# Patient Record
Sex: Male | Born: 1955 | Race: White | Hispanic: No | Marital: Married | Smoking: Never smoker
Health system: Southern US, Community
[De-identification: ages and names within clinical notes are randomized; demographics above are authoritative.]

## PROBLEM LIST (undated history)

## (undated) DIAGNOSIS — J309 Allergic rhinitis, unspecified: Secondary | ICD-10-CM

## (undated) DIAGNOSIS — K219 Gastro-esophageal reflux disease without esophagitis: Secondary | ICD-10-CM

## (undated) DIAGNOSIS — J45990 Exercise induced bronchospasm: Secondary | ICD-10-CM

## (undated) DIAGNOSIS — G4730 Sleep apnea, unspecified: Secondary | ICD-10-CM

## (undated) DIAGNOSIS — N529 Male erectile dysfunction, unspecified: Secondary | ICD-10-CM

## (undated) DIAGNOSIS — N400 Enlarged prostate without lower urinary tract symptoms: Secondary | ICD-10-CM

## (undated) DIAGNOSIS — N429 Disorder of prostate, unspecified: Secondary | ICD-10-CM

## (undated) DIAGNOSIS — F32A Depression, unspecified: Secondary | ICD-10-CM

## (undated) DIAGNOSIS — M353 Polymyalgia rheumatica: Secondary | ICD-10-CM

## (undated) DIAGNOSIS — R1013 Epigastric pain: Secondary | ICD-10-CM

## (undated) DIAGNOSIS — E7800 Pure hypercholesterolemia, unspecified: Secondary | ICD-10-CM

## (undated) DIAGNOSIS — J45909 Unspecified asthma, uncomplicated: Secondary | ICD-10-CM

## (undated) DIAGNOSIS — R140 Abdominal distension (gaseous): Secondary | ICD-10-CM

## (undated) DIAGNOSIS — R7303 Prediabetes: Secondary | ICD-10-CM

## (undated) DIAGNOSIS — Z87442 Personal history of urinary calculi: Secondary | ICD-10-CM

## (undated) HISTORY — PX: ANKLE SURGERY: SHX546

## (undated) HISTORY — PX: COLONOSCOPY: SHX174

## (undated) HISTORY — PX: VASECTOMY: SHX75

## (undated) HISTORY — PX: WISDOM TOOTH EXTRACTION: SHX21

## (undated) HISTORY — PX: TONSILLECTOMY: SUR1361

## (undated) HISTORY — DX: Unspecified asthma, uncomplicated: J45.909

## (undated) HISTORY — DX: Allergic rhinitis, unspecified: J30.9

## (undated) HISTORY — DX: Male erectile dysfunction, unspecified: N52.9

## (undated) HISTORY — PX: CHEST TUBE INSERTION: SHX231

## (undated) HISTORY — DX: Epigastric pain: R10.13

## (undated) HISTORY — DX: Benign prostatic hyperplasia without lower urinary tract symptoms: N40.0

## (undated) HISTORY — DX: Gastro-esophageal reflux disease without esophagitis: K21.9

## (undated) HISTORY — DX: Pure hypercholesterolemia, unspecified: E78.00

## (undated) HISTORY — PX: SKIN CANCER EXCISION: SHX779

## (undated) HISTORY — DX: Prediabetes: R73.03

## (undated) HISTORY — PX: KNEE SURGERY: SHX244

## (undated) HISTORY — DX: Abdominal distension (gaseous): R14.0

## (undated) HISTORY — DX: Depression, unspecified: F32.A

---

## 1998-07-24 ENCOUNTER — Ambulatory Visit (HOSPITAL_BASED_OUTPATIENT_CLINIC_OR_DEPARTMENT_OTHER): Admission: RE | Admit: 1998-07-24 | Discharge: 1998-07-24 | Payer: Self-pay | Admitting: Urology

## 2002-01-14 ENCOUNTER — Ambulatory Visit (HOSPITAL_BASED_OUTPATIENT_CLINIC_OR_DEPARTMENT_OTHER): Admission: RE | Admit: 2002-01-14 | Discharge: 2002-01-14 | Payer: Self-pay | Admitting: Orthopedic Surgery

## 2002-02-03 ENCOUNTER — Ambulatory Visit (HOSPITAL_COMMUNITY): Admission: RE | Admit: 2002-02-03 | Discharge: 2002-02-03 | Payer: Self-pay | Admitting: *Deleted

## 2002-02-03 ENCOUNTER — Encounter: Payer: Self-pay | Admitting: *Deleted

## 2003-05-20 ENCOUNTER — Ambulatory Visit (HOSPITAL_BASED_OUTPATIENT_CLINIC_OR_DEPARTMENT_OTHER): Admission: RE | Admit: 2003-05-20 | Discharge: 2003-05-20 | Payer: Self-pay | Admitting: Family Medicine

## 2003-05-25 ENCOUNTER — Encounter (INDEPENDENT_AMBULATORY_CARE_PROVIDER_SITE_OTHER): Payer: Self-pay | Admitting: *Deleted

## 2003-05-25 ENCOUNTER — Ambulatory Visit (HOSPITAL_COMMUNITY): Admission: RE | Admit: 2003-05-25 | Discharge: 2003-05-25 | Payer: Self-pay | Admitting: Gastroenterology

## 2005-08-19 ENCOUNTER — Ambulatory Visit (HOSPITAL_COMMUNITY): Admission: RE | Admit: 2005-08-19 | Discharge: 2005-08-19 | Payer: Self-pay | Admitting: Family Medicine

## 2005-09-03 ENCOUNTER — Ambulatory Visit (HOSPITAL_COMMUNITY): Admission: RE | Admit: 2005-09-03 | Discharge: 2005-09-03 | Payer: Self-pay | Admitting: Family Medicine

## 2007-04-02 DIAGNOSIS — G43909 Migraine, unspecified, not intractable, without status migrainosus: Secondary | ICD-10-CM

## 2007-04-02 HISTORY — DX: Migraine, unspecified, not intractable, without status migrainosus: G43.909

## 2007-06-17 ENCOUNTER — Emergency Department (HOSPITAL_COMMUNITY): Admission: EM | Admit: 2007-06-17 | Discharge: 2007-06-18 | Payer: Self-pay | Admitting: Emergency Medicine

## 2010-04-22 ENCOUNTER — Encounter: Payer: Self-pay | Admitting: Family Medicine

## 2010-05-09 ENCOUNTER — Other Ambulatory Visit: Payer: Self-pay | Admitting: Orthopedic Surgery

## 2010-05-09 ENCOUNTER — Ambulatory Visit (HOSPITAL_BASED_OUTPATIENT_CLINIC_OR_DEPARTMENT_OTHER)
Admission: RE | Admit: 2010-05-09 | Discharge: 2010-05-09 | Disposition: A | Discharge status: Home / Self Care | Payer: BC Managed Care – PPO | Source: Ambulatory Visit | Attending: Orthopedic Surgery | Admitting: Orthopedic Surgery

## 2010-05-09 DIAGNOSIS — D2120 Benign neoplasm of connective and other soft tissue of unspecified lower limb, including hip: Secondary | ICD-10-CM | POA: Insufficient documentation

## 2010-05-09 LAB — POCT HEMOGLOBIN-HEMACUE: Hemoglobin: 16.1 g/dL (ref 13.0–17.0)

## 2010-06-17 NOTE — Op Note (Signed)
  NAME:  Elijah [name], Elijah [name] NO.:  [id number]  MEDICAL RECORD NO.:  [id number]           PATIENT TYPE:  LOCATION:                                 FACILITY:  PHYSICIAN:  Leonides Grills, M.D.          DATE OF BIRTH:  DATE OF PROCEDURE:  05/09/2010 DATE OF DISCHARGE:                              OPERATIVE REPORT   PREOPERATIVE DIAGNOSIS:  Benign deep soft tissue lesion, left foot.  POSTOPERATIVE DIAGNOSIS:  Benign deep soft tissue lesion, left foot.  OPERATION:  Excision of left benign deep soft tissue lesion, left foot.  ANESTHESIA:  General.  SURGEON:  Leonides Grills, MD  ASSISTANT:  Richardean Canal, PA  ESTIMATED BLOOD LOSS:  Minimal.  TOURNIQUET TIME:  Approximately 15 minutes.  COMPLICATIONS:  None.  DISPOSITION:  Stable to PR.  INDICATIONS:  This is a 55 year old male who has a painful lesion over the lateral aspect of his left foot that was interfering with his life. MRI showed it was a soft tissue fibrous lesion.  He was consented for the above procedure.  All risks of infection, nerve or vessel injury, recurrence of lesion, persistent pain, prolonged recovery, wound healing problems were all explained, questions were encouraged and answered. The patient was brought to the operating room, placed in supine position.  After adequate general endotracheal tube anesthesia was administered as well as Ancef 1 g IV piggyback, a bump was placed in the left ipsilateral hip, internally rotating left lower extremity.  All bony prominence were well padded.  Left lower extremity was then prepped and draped in sterile manner over proximally thigh tourniquet.  Limb was gravity exsanguinated.  Tourniquet was elevated to 290 mmHg.  An ellipsed-shaped incision was then made.  Dissection was carried down full thickness en bloc to muscle, this was then removed.  Tourniquet was deflated, hemostasis was obtained.  The area was copiously irrigated with normal saline.  Skin was  closed with 4-0 nylon stitch.  Sterile dressing was applied.  Hard-sole shoe was applied.  The patient stable to PR.     Leonides Grills, M.D.     PB/MEDQ  D:  05/09/2010  T:  05/10/2010  Job:  [id number]  Electronically Signed by Leonides Grills M.D. on 06/17/2010 08:31:01 AM

## 2010-08-17 NOTE — Op Note (Signed)
NAME:  Elijah [name], Elijah [name] NO.:  [id number]   MEDICAL RECORD NO.:  [id number]                   PATIENT TYPE:  AMB   LOCATION:  ENDO                                 FACILITY:  [facility]   PHYSICIAN:  Petra Kuba, M.D.                 DATE OF BIRTH:  Aug 27, 1955   DATE OF PROCEDURE:  05/25/2003  DATE OF DISCHARGE:                                 OPERATIVE REPORT   PROCEDURE PERFORMED:  Colonoscopy with polypectomy.   ENDOSCOPIST:  Petra Kuba, M.D.   INDICATIONS FOR PROCEDURE:  Bright red blood per rectum in a patient almost  due for colonic screening.  Consent was signed after the risks, benefits,  methods and options were thoroughly discussed with the patient and his wife.   MEDICINES USED:  No additional medicines were given.   DESCRIPTION OF PROCEDURE:  Rectal inspection was negative.  Digital exam was  negative.  A regular video colonoscope was inserted and anorectal  pullthrough and retroflexion confirmed some internal hemorrhoids.  No signs  of bleeding were seen.  The scope was reinserted easily around his colon.  This did require some abdominal pressure but no position changes.  The cecum  was identified by the appendiceal orifice and the ileocecal valve.  In fact  the scope was inserted a short ways in the terminal ileum which was normal.  Photodocumentation was obtained.  The prep on the right side was not as good  as the left and there was some stool adherent to the wall which could not be  washed off.  No obvious abnormality could be seen on insertion or any signs  of bleeding.  The scope was slowly withdrawn.  No abnormalities were seen as  we slowly withdrew back to the distal sigmoid.  The prep on the left side  was adequate with easily able to wash and suction the stool.  A distal  sigmoid tiny polyp was seen and was hot biopsied times one, also a proximal  rectum polyp was seen and this was hot biopsied times one and then put in  the  same container.  Again anorectal pullthrough and retroflexion confirmed  these small internal hemorrhoids.  Scope was reinserted a short ways up the  left side of the colon, air was suctioned, scope removed.  The patient  tolerated the procedure well.  There was no immediate obvious complication.   ENDOSCOPIC DIAGNOSIS:  1. Internal hemorrhoids.  2. Two tiny polyps in the rectum and distal sigmoid hot biopsied.  3. Otherwise within normal limits to the terminal ileum.   PLAN:  Treat internal hemorrhoids with Anusol suppositories 25 gm HC.  Follow-up as needed or in two months to recheck symptoms and make sure no  further work-up plans are needed. Await pathology but probably recheck colon  screening in five years.  Petra Kuba, M.D.    MEM/MEDQ  D:  05/25/2003  T:  05/26/2003  Job:  [id number]   cc:   Donia Guiles, M.D.  [address] [place]  [place] [address]  Fax: [phone]

## 2010-08-17 NOTE — Op Note (Signed)
NAME:  Elijah [name], Elijah [name] NO.:  [id number]   MEDICAL RECORD NO.:  [id number]                   PATIENT TYPE:  AMB   LOCATION:  ENDO                                 FACILITY:  [facility]   PHYSICIAN:  Petra Kuba, M.D.                 DATE OF BIRTH:  1955-12-29   DATE OF PROCEDURE:  05/25/2003  DATE OF DISCHARGE:                                 OPERATIVE REPORT   PROCEDURE PERFORMED:  Esophagogastroduodenoscopy with Savary dilatation.   ENDOSCOPIST:  Petra Kuba, M.D.   INDICATIONS FOR PROCEDURE:  Dysphagia.  Consent was signed after the risks,  benefits, methods and options were thoroughly discussed in the office with  both the patient and his wife.   MEDICINES USED:  Demerol 120 mg, Versed 12 mg.   DESCRIPTION OF PROCEDURE:  The video endoscope was inserted by direct  vision.  The esophagus looked normal.  No ring or stricture was seen.  He  did have a tiny hiatal hernia in the distal esophagus.  We did rewithdraw  multiple times to about 18 cm and advanced into the stomach without any  obvious abnormalities.  Scope was then inserted into the stomach, advanced  through a normal antrum, normal pylorus into the duodenal bulb.  Possibly  there was some signs of a previously healed ulcer, but no active ulceration.  The scope was advanced around the C-loop to a normal second portion of the  duodenum.  The scope was withdrawn back to the bulb and a good look there  confirmed above findings.  Scope was withdrawn back to the stomach and  retroflexed.  High in the cardia, the hiatal hernia was confirmed.  The  fundus, angularis, lesser and greater curve were all normal.  Straight  visualization of the stomach did not reveal any additional findings.  Again,  the scope was slowly withdrawn back to about 18 cm.  No additional  esophageal findings were seen.  The scope was then advanced to the antrum  and under fluoroscopic guidance, a Savary wire was advanced.   The customary  J-loop was confirmed and under fluoro guidance, the scope was withdrawn,  making sure to keep the wire in the proper position.  We went ahead and  proceeded with a 16 mm dilator under fluoro guidance over the wire which  passed without resistance and was confirmed in the stomach.  Once in the  stomach, the dilator and wire were removed in tandem.  There was no heme on  the dilator.  The patient tolerated the procedure well.  There were no  obvious immediate complication.   ENDOSCOPIC DIAGNOSIS:  1. Tiny hiatal hernia.  2. Questionable bulb with scarring from healed ulcer.  3. Otherwise normal esophagogastroduodenoscopy with therapy.  Savary     dilatation under fluoro to 16 mm without resistance or heme.   PLAN:  Nexium one time a day.  Barium  swallow, barium tablet as needed.  Will continue work-up with flexible sigmoidoscopy and colonoscopy and see  back to see how his symptoms are to see if more aggressive dilations or  further work-up plan is needed.                                               Petra Kuba, M.D.    MEM/MEDQ  D:  05/25/2003  T:  05/26/2003  Job:  [id number]   cc:   Donia Guiles, M.D.  [address] [place]  [place] [address]  Fax: [phone]

## 2010-08-17 NOTE — Op Note (Signed)
NAME:  Elijah [name], Elijah [name] NO.:  [id number]   MEDICAL RECORD NO.:  [id number]                   PATIENT TYPE:  AMB   LOCATION:  DSC                                  FACILITY:  [facility]   PHYSICIAN:  Loreta Ave, M.D.              DATE OF BIRTH:  1955-12-31   DATE OF PROCEDURE:  01/14/2002  DATE OF DISCHARGE:                                 OPERATIVE REPORT   PREOPERATIVE DIAGNOSES:  Symptomatic medial plica and traumatic  chondromalacia, right knee.   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  Normal tracking.  Traumatic superficial grade 3 chondromalacia of medial  femoral condyle with reactive synovitis.   PROCEDURES:  1. Right knee examination under anesthesia.  2. Arthroscopy with chondroplasty of patellofemoral joint and medial femoral     condyle.  3. Excision, medial plica.   SURGEON:  Loreta Ave, M.D.   ASSISTANT:  Arlys John D. Petrarca, P.A.-C.   ANESTHESIA:  General.   ESTIMATED BLOOD LOSS:  Minimal.   SPECIMENS:  None.   CULTURES:  None.   COMPLICATIONS:  None.   DRESSING:  Soft compressive.   DESCRIPTION OF PROCEDURE:  Patient brought to the operating room and placed  on the operating table in supine position.  After adequate anesthesia had  been obtained, right knee examined.  Good motion, good stability.  Patellofemoral crepitus but normal tracking.  Tourniquet and leg holder  applied, leg prepped and draped in the usual sterile fashion.  Three portals  created, one superolateral, one each medial and lateral peripatellar.  Inflow catheter introduced, knee distended, arthroscope introduced, knee  inspected.  Reactive synovitis throughout debrided.  Large fibrotic medial  plica excised.  Focal fibrillated grade 3 chondromalacia on medial border of  the  patella, treated with chondroplasty.  Tracking assessed and  patellofemoral tracking excellent.  Deep grade 3 chondral lesion in the  trochlea with fibrocartilage already at the base.  Loose flaps and  fragmentation.  All of this debrided.  At completion that entire area had  been debrided and there was reasonable articular cartilage on the margin  where the patella rode.  Some superficial grade 3 chondral changes, medial  femoral condyle weightbearing dome, treated with chondroplasty.  Remaining  knee examined and no other findings.  Medial and lateral meniscus intact  except for a little fraying in the central part of the lateral meniscus,  which was debrided.  Cruciate ligaments intact.  Hemostasis  with cautery.  Instruments and fluid removed.  Portals and the knee injected  with Marcaine.  Portals closed with 4-0 nylon.  A sterile compressive  dressing applied.  Anesthesia reversed.  Brought to the recovery room.  Tolerated the surgery well with no complications.  Loreta Ave, M.D.    DFM/MEDQ  D:  01/14/2002  T:  01/15/2002  Job:  [id number]

## 2010-12-24 LAB — COMPREHENSIVE METABOLIC PANEL
ALT: 20
AST: 19
Albumin: 3.6
Alkaline Phosphatase: 51
BUN: 18
CO2: 24
Calcium: 8.9
Chloride: 107
Creatinine, Ser: 1.27
GFR calc Af Amer: >60
GFR calc non Af Amer: >60
Glucose, Bld: 92
Potassium: 3.6
Sodium: 138
Total Bilirubin: 0.6
Total Protein: 6.4

## 2010-12-24 LAB — CK TOTAL AND CKMB (NOT AT ARMC)
CK, MB: 0.9
Relative Index: INVALID
Total CK: 91

## 2010-12-24 LAB — POCT I-STAT CREATININE
Creatinine, Ser: 1.5
Operator id: 257131

## 2010-12-24 LAB — I-STAT 8, (EC8 V) (CONVERTED LAB)
BUN: 18
Bicarbonate: 24.6 — ABNORMAL HIGH
Chloride: 108
Glucose, Bld: 94
HCT: 48
Hemoglobin: 16.3
Operator id: 257131
Potassium: 3.6
Sodium: 141
TCO2: 26
pCO2, Ven: 40.2 — ABNORMAL LOW
pH, Ven: 7.396 — ABNORMAL HIGH

## 2010-12-24 LAB — CBC
HCT: 43.3
Hemoglobin: 14.9
MCHC: 34.5
MCV: 85.9
Platelets: 235
RBC: 5.04
RDW: 13.6
WBC: 6.4

## 2010-12-24 LAB — DIFFERENTIAL
Basophils Absolute: 0
Basophils Relative: 0
Eosinophils Absolute: 0.2
Eosinophils Relative: 4
Lymphocytes Relative: 18
Lymphs Abs: 1.2
Monocytes Absolute: 1.2 — ABNORMAL HIGH
Monocytes Relative: 19 — ABNORMAL HIGH
Neutro Abs: 3.8
Neutrophils Relative %: 59

## 2010-12-24 LAB — PROTIME-INR
INR: 1
Prothrombin Time: 13.3

## 2010-12-24 LAB — APTT: aPTT: 28

## 2010-12-24 LAB — TROPONIN I: Troponin I: <0.01

## 2014-02-21 ENCOUNTER — Other Ambulatory Visit: Payer: Self-pay | Admitting: Dermatology

## 2019-11-05 ENCOUNTER — Other Ambulatory Visit: Payer: Self-pay | Admitting: Surgery

## 2019-11-05 DIAGNOSIS — R1011 Right upper quadrant pain: Secondary | ICD-10-CM

## 2019-11-26 ENCOUNTER — Other Ambulatory Visit: Payer: Self-pay | Admitting: Gastroenterology

## 2019-11-26 DIAGNOSIS — R1013 Epigastric pain: Secondary | ICD-10-CM

## 2019-11-26 DIAGNOSIS — R140 Abdominal distension (gaseous): Secondary | ICD-10-CM

## 2019-12-07 ENCOUNTER — Ambulatory Visit
Admission: RE | Admit: 2019-12-07 | Discharge: 2019-12-07 | Disposition: A | Discharge status: Home / Self Care | Payer: BC Managed Care – PPO | Source: Ambulatory Visit | Attending: Gastroenterology | Admitting: Gastroenterology

## 2019-12-07 DIAGNOSIS — R140 Abdominal distension (gaseous): Secondary | ICD-10-CM

## 2019-12-07 DIAGNOSIS — R1013 Epigastric pain: Secondary | ICD-10-CM

## 2020-06-21 ENCOUNTER — Encounter (HOSPITAL_BASED_OUTPATIENT_CLINIC_OR_DEPARTMENT_OTHER): Payer: Self-pay

## 2020-06-21 ENCOUNTER — Emergency Department (HOSPITAL_BASED_OUTPATIENT_CLINIC_OR_DEPARTMENT_OTHER)
Admission: EM | Admit: 2020-06-21 | Discharge: 2020-06-21 | Disposition: A | Discharge status: Home / Self Care | Payer: BC Managed Care – PPO | Attending: Emergency Medicine | Admitting: Emergency Medicine

## 2020-06-21 ENCOUNTER — Other Ambulatory Visit: Payer: Self-pay

## 2020-06-21 DIAGNOSIS — N23 Unspecified renal colic: Secondary | ICD-10-CM | POA: Diagnosis not present

## 2020-06-21 DIAGNOSIS — R109 Unspecified abdominal pain: Secondary | ICD-10-CM | POA: Diagnosis present

## 2020-06-21 HISTORY — DX: Disorder of prostate, unspecified: N42.9

## 2020-06-21 HISTORY — DX: Gastro-esophageal reflux disease without esophagitis: K21.9

## 2020-06-21 HISTORY — DX: Sleep apnea, unspecified: G47.30

## 2020-06-21 LAB — URINALYSIS, ROUTINE W REFLEX MICROSCOPIC
Bilirubin Urine: NEGATIVE
Glucose, UA: NEGATIVE mg/dL
Ketones, ur: 15 mg/dL — AB
Leukocytes,Ua: NEGATIVE
Nitrite: NEGATIVE
Protein, ur: NEGATIVE mg/dL
RBC / HPF: >50 RBC/hpf — ABNORMAL HIGH (ref 0–5)
Specific Gravity, Urine: 1.019 (ref 1.005–1.030)
pH: 5 (ref 5.0–8.0)

## 2020-06-21 MED ORDER — HYDROCODONE-ACETAMINOPHEN 5-325 MG PO TABS
1.0000 | ORAL_TABLET | Freq: Once | ORAL | Status: AC
  Administered 2020-06-21: 1 via ORAL
  Filled 2020-06-21: qty 1

## 2020-06-21 MED ORDER — PROCHLORPERAZINE EDISYLATE 10 MG/2ML IJ SOLN
5.0000 mg | Freq: Once | INTRAMUSCULAR | Status: AC
  Administered 2020-06-21: 5 mg via INTRAVENOUS
  Filled 2020-06-21: qty 2

## 2020-06-21 MED ORDER — KETOROLAC TROMETHAMINE 15 MG/ML IJ SOLN
15.0000 mg | Freq: Once | INTRAMUSCULAR | Status: AC
  Administered 2020-06-21: 15 mg via INTRAVENOUS
  Filled 2020-06-21: qty 1

## 2020-06-21 MED ORDER — HYDROMORPHONE HCL 1 MG/ML IJ SOLN
0.5000 mg | Freq: Once | INTRAMUSCULAR | Status: AC
  Administered 2020-06-21: 0.5 mg via INTRAVENOUS
  Filled 2020-06-21: qty 1

## 2020-06-21 MED ORDER — ONDANSETRON HCL 4 MG/2ML IJ SOLN
4.0000 mg | Freq: Once | INTRAMUSCULAR | Status: AC
Start: 2020-06-21 — End: 2020-06-21
  Administered 2020-06-21: 4 mg via INTRAVENOUS
  Filled 2020-06-21: qty 2

## 2020-06-21 MED ORDER — HYDROCODONE-ACETAMINOPHEN 5-325 MG PO TABS: 0.5000 | ORAL_TABLET | Freq: Three times a day (TID) | ORAL | 0 refills | Status: AC | PRN

## 2020-06-21 MED ORDER — KETOROLAC TROMETHAMINE 15 MG/ML IJ SOLN
7.5000 mg | Freq: Once | INTRAMUSCULAR | Status: AC
  Administered 2020-06-21: 7.5 mg via INTRAVENOUS

## 2020-06-21 MED ORDER — ONDANSETRON HCL 4 MG/2ML IJ SOLN
4.0000 mg | Freq: Once | INTRAMUSCULAR | Status: AC
  Administered 2020-06-21: 4 mg via INTRAVENOUS
  Filled 2020-06-21: qty 2

## 2020-06-21 MED ORDER — SODIUM CHLORIDE 0.9 % IV BOLUS
1000.0000 mL | Freq: Once | INTRAVENOUS | Status: AC
  Administered 2020-06-21: 1000 mL via INTRAVENOUS

## 2020-06-21 MED ORDER — ONDANSETRON 4 MG PO TBDP: 4.0000 mg | ORAL_TABLET | Freq: Three times a day (TID) | ORAL | 0 refills | Status: AC | PRN

## 2020-06-21 NOTE — ED Provider Notes (Addendum)
[place] EMERGENCY DEPT Provider Note  CSN: [id number] Arrival date & time: 06/21/20 0225  Chief Complaint(s) Flank Pain  HPI Elijah [name] is a 65 y.o. male with a past medical history listed below referring BPH here for left flank pain that began suddenly 1-1/2 hours prior to arrival.  Pain is deep ache that is fluctuating in nature and radiating to lower left abdomen.  Pain is moderate to severe.  No alleviating or aggravating factors.  Its associated with nausea and nonbloody nonbilious emesis.  No diarrhea.  No other physical complaints.  Patient is unaware of any history of renal stones but on review of systems there was a CT scan performed last year that showed 2 nonobstructing kidney stones on the left measuring 3-1/2 mm or less.  HPI  Past Medical History Past Medical History:  Diagnosis Date  . Acid reflux   . Migraine 2009  . Prostate disease   . Sleep apnea    There are no problems to display for this patient.  Home Medication(s) Prior to Admission medications   Medication Sig Start Date End Date Taking? Authorizing Provider  albuterol (PROVENTIL) (2.5 MG/3ML) 0.083% nebulizer solution Take 2.5 mg by nebulization every 6 (six) hours as needed for wheezing or shortness of breath.   Yes [provider]  alfuzosin (UROXATRAL) 10 MG 24 hr tablet Take 10 mg by mouth daily with breakfast.   Yes [provider]  finasteride (PROSCAR) 5 MG tablet Take 5 mg by mouth daily.   Yes [provider]  HYDROcodone-acetaminophen (NORCO/VICODIN) 5-325 MG tablet Take 0.5-1 tablets by mouth every 8 (eight) hours as needed for up to 5 days for severe pain (That is not improved by your scheduled acetaminophen regimen). Please do not exceed 4000 mg of acetaminophen (Tylenol) a 24-hour period. Please note that he may be prescribed additional medicine that contains acetaminophen. 06/21/20 06/26/20 Yes Pao Haffey, Grayce Sessions, MD  ondansetron (ZOFRAN ODT)  4 MG disintegrating tablet Take 1 tablet (4 mg total) by mouth every 8 (eight) hours as needed for up to 3 days for nausea or vomiting. 06/21/20 06/24/20 Yes Rayan Ines, Grayce Sessions, MD                                                                                                                                    Past Surgical History ** The histories are not reviewed yet. Please review them in the "History" navigator section and refresh this [place]. Family History No family history on file.  Social History   Allergies Singulair [montelukast] and Crestor [rosuvastatin]  Review of Systems Review of Systems All other systems are reviewed and are negative for acute change except as noted in the HPI  Physical Exam Vital Signs  I have reviewed the triage vital signs BP (!) 151/80 (BP Location: Right Arm)   Pulse 63   Temp 98 F (36.7 C) (Oral)   Resp 16  Ht 6\' 1"  (1.854 m)   Wt 105.2 kg   SpO2 99%   BMI 30.61 kg/m   Physical Exam Vitals reviewed.  Constitutional:      General: He is not in acute distress.    Appearance: He is well-developed. He is not diaphoretic.  HENT:     Head: Normocephalic and atraumatic.     Jaw: No trismus.     Right Ear: External ear normal.     Left Ear: External ear normal.     Nose: Nose normal.  Eyes:     General: No scleral icterus.    Conjunctiva/sclera: Conjunctivae normal.  Neck:     Trachea: Phonation normal.  Cardiovascular:     Rate and Rhythm: Normal rate and regular rhythm.  Pulmonary:     Effort: Pulmonary effort is normal. No respiratory distress.     Breath sounds: No stridor.  Abdominal:     General: There is no distension.     Tenderness: There is no abdominal tenderness.  Musculoskeletal:        General: Normal range of motion.     Cervical back: Normal range of motion.  Skin:    Comments: Palpable purpuric rash on right upper extremity.  Neurological:     Mental Status: He is alert and oriented to person, place,  and time.  Psychiatric:        Behavior: Behavior normal.     ED Results and Treatments Labs (all labs ordered are listed, but only abnormal results are displayed) Labs Reviewed  URINALYSIS, ROUTINE W REFLEX MICROSCOPIC - Abnormal; Notable for the following components:      Result Value   Hgb urine dipstick LARGE (*)    Ketones, ur 15 (*)    RBC / HPF >50 (*)    All other components within normal limits                                                                                                                         EKG  EKG Interpretation  Date/Time:    Ventricular Rate:    PR Interval:    QRS Duration:   QT Interval:    QTC Calculation:   R Axis:     Text Interpretation:        Radiology No results found.  Pertinent labs & imaging results that were available during my care of the patient were reviewed by me and considered in my medical decision making (see chart for details).  Medications Ordered in ED Medications  ondansetron (ZOFRAN) injection 4 mg (4 mg Intravenous Given 06/21/20 0244)  ketorolac (TORADOL) 15 MG/ML injection 15 mg (15 mg Intravenous Given 06/21/20 0244)  sodium chloride 0.9 % bolus 1,000 mL (0 mLs Intravenous Stopped 06/21/20 0500)  ketorolac (TORADOL) 15 MG/ML injection 7.5 mg (7.5 mg Intravenous Given 06/21/20 0341)  HYDROmorphone (DILAUDID) injection 0.5 mg (0.5 mg Intravenous Given 06/21/20 0340)  ondansetron (ZOFRAN) injection 4 mg (4 mg Intravenous Given 06/21/20 0340)  HYDROmorphone (DILAUDID) injection 0.5 mg (0.5 mg Intravenous Given 06/21/20 0441)  prochlorperazine (COMPAZINE) injection 5 mg (5 mg Intravenous Given 06/21/20 0441)  HYDROcodone-acetaminophen (NORCO/VICODIN) 5-325 MG per tablet 1 tablet (1 tablet Oral Given 06/21/20 1610)                                                                                                                                    Procedures Ultrasound ED Abd  Date/Time: 06/21/2020 4:23 AM Performed by:  Fatima Blank, MD Authorized by: Fatima Blank, MD   Procedure details:    Indications: flank pain     Assessment for:  Kidney stones and hydronephrosis   Left renal:  Visualized   Right renal:  Visualized       Left renal findings:    Nephrolithiasis: not identified     Renal stones: not identified     Perinephric fluid: not identified     Hydronephrosis: none   Right renal findings:    Nephrolithiasis: not identified     Renal stones: not identified     Perinephric fluid: not identified     Hydronephrosis: none   Comments:     Right renal cysts    (including critical care time)  Medical Decision Making / ED Course I have reviewed the nursing notes for this encounter and the patient's prior records (if available in EHR or on provided paperwork).   Elijah [name] was evaluated in Emergency Department on 06/21/2020 for the symptoms described in the history of present illness. He was evaluated in the context of the global COVID-19 pandemic, which necessitated consideration that the patient might be at risk for infection with the SARS-CoV-2 virus that causes COVID-19. Institutional protocols and algorithms that pertain to the evaluation of patients at risk for COVID-19 are in a state of rapid change based on information released by regulatory bodies including the CDC and federal and state organizations. These policies and algorithms were followed during the patient's care in the ED.  Left flank pain. Presentation is most suspicious for renal colic. Known left-sided renal stones seen on recent CT scan.  On CT scan there is no evidence of a AAA or diverticulosis. Abdomen is benign on exam. We will treat for renal colic with Toradol, Zofran and IV fluids. We will obtain a UA to assess for infection.  POCUS w/o evidence of hydronephrosis. No labs needed at this time.  Pain improved initially, but returned. Controlled after second round of meds. Patient already  has Alfuzosin at home. Recommended to take daily. Urology f/u as needed.      Final Clinical Impression(s) / ED Diagnoses Final diagnoses:  Renal colic on left side    The patient appears reasonably screened and/or stabilized for discharge and I doubt any other medical condition or other [facility] requiring further screening, evaluation, or treatment in the ED at this time prior to discharge. Safe for discharge with  strict return precautions.  Disposition: Discharge  Condition: Good  I have discussed the results, Dx and Tx plan with the patient/family who expressed understanding and agree(s) with the plan. Discharge instructions discussed at length. The patient/family was given strict return precautions who verbalized understanding of the instructions. No further questions at time of discharge.    ED Discharge Orders         Ordered    ondansetron (ZOFRAN ODT) 4 MG disintegrating tablet  Every 8 hours PRN        06/21/20 0558    HYDROcodone-acetaminophen (NORCO/VICODIN) 5-325 MG tablet  Every 8 hours PRN        06/21/20 [place] narcotic database reviewed and no active prescriptions noted.   Follow Up: Urology  Call  as needed     This chart was dictated using voice recognition software.  Despite best efforts to proofread,  errors can occur which can change the documentation meaning.     Fatima Blank, MD 06/21/20 [phone]

## 2020-06-21 NOTE — Discharge Instructions (Addendum)
For pain control you may take 600 mg of Ibuprofen every 6 hours or 440 mg of Naproxen every 12 hours.  In addition you can take 0.5 to 1 tablet of Norco/Vicodin every 8 hours for pain not controlled with the Ibuprofen or Naproxen.  Please take your Alfuzosin daily as this will help you pass the stone.  Follow up with Urology if needed.

## 2020-06-21 NOTE — ED Triage Notes (Addendum)
Pt via POV. Pt reports left sided flank pain that radiates to ABD started approx 0100 am today. +vomiting/Nausea.

## 2020-06-27 ENCOUNTER — Other Ambulatory Visit: Payer: Self-pay | Admitting: Urology

## 2020-06-27 ENCOUNTER — Other Ambulatory Visit: Payer: Self-pay

## 2020-06-27 ENCOUNTER — Encounter (HOSPITAL_COMMUNITY): Payer: Self-pay | Admitting: Urology

## 2020-06-27 NOTE — Progress Notes (Signed)
COVID Vaccine Completed: No Date COVID Vaccine completed: N/A Has received booster: N/A COVID vaccine manufacturer: N/A  Date of COVID positive in last 90 days: No  PCP - [name], MD Cardiologist - N/A   Chest x-ray - N/A EKG -N/A  Stress Test - greater than 2 years ECHO - N/A Cardiac Cath - N/A Pacemaker/ICD device last checked: N/A  Sleep Study - Yes CPAP - Yes  Fasting Blood Sugar - N/A Checks Blood Sugar _N/A____ times a day  Blood Thinner Instructions: N/A Aspirin Instructions: N/A Last Dose: N/A  Activity level:  Able to exercise without symptoms     Anesthesia review: N/A  Patient denies shortness of breath, fever, cough and chest pain at PAT appointment   Patient verbalized understanding of instructions that were given to them at the PAT appointment. Patient was also instructed that they will need to review over the PAT instructions again at home before surgery.

## 2020-06-28 ENCOUNTER — Other Ambulatory Visit (HOSPITAL_COMMUNITY)
Admission: RE | Admit: 2020-06-28 | Discharge: 2020-06-28 | Disposition: A | Discharge status: Home / Self Care | Payer: BC Managed Care – PPO | Source: Ambulatory Visit | Attending: Urology | Admitting: Urology

## 2020-06-28 DIAGNOSIS — Z888 Allergy status to other drugs, medicaments and biological substances status: Secondary | ICD-10-CM | POA: Diagnosis not present

## 2020-06-28 DIAGNOSIS — Z20822 Contact with and (suspected) exposure to covid-19: Secondary | ICD-10-CM | POA: Insufficient documentation

## 2020-06-28 DIAGNOSIS — Z01812 Encounter for preprocedural laboratory examination: Secondary | ICD-10-CM | POA: Diagnosis not present

## 2020-06-28 DIAGNOSIS — N201 Calculus of ureter: Secondary | ICD-10-CM | POA: Diagnosis present

## 2020-06-28 DIAGNOSIS — Z85828 Personal history of other malignant neoplasm of skin: Secondary | ICD-10-CM | POA: Diagnosis not present

## 2020-06-28 LAB — SARS CORONAVIRUS 2 (TAT 6-24 HRS): SARS Coronavirus 2: NEGATIVE

## 2020-06-29 ENCOUNTER — Ambulatory Visit (HOSPITAL_COMMUNITY): Payer: BC Managed Care – PPO | Admitting: Certified Registered Nurse Anesthetist

## 2020-06-29 ENCOUNTER — Ambulatory Visit (HOSPITAL_COMMUNITY)
Admission: RE | Admit: 2020-06-29 | Discharge: 2020-06-29 | Disposition: A | Discharge status: Home / Self Care | Payer: BC Managed Care – PPO | Attending: Urology | Admitting: Urology

## 2020-06-29 ENCOUNTER — Ambulatory Visit (HOSPITAL_COMMUNITY): Payer: BC Managed Care – PPO

## 2020-06-29 ENCOUNTER — Encounter (HOSPITAL_COMMUNITY)
Admission: RE | Disposition: A | Discharge status: Home / Self Care | Payer: Self-pay | Source: Home / Self Care | Attending: Urology

## 2020-06-29 DIAGNOSIS — Z85828 Personal history of other malignant neoplasm of skin: Secondary | ICD-10-CM | POA: Insufficient documentation

## 2020-06-29 DIAGNOSIS — N201 Calculus of ureter: Secondary | ICD-10-CM | POA: Insufficient documentation

## 2020-06-29 DIAGNOSIS — Z20822 Contact with and (suspected) exposure to covid-19: Secondary | ICD-10-CM | POA: Insufficient documentation

## 2020-06-29 DIAGNOSIS — Z888 Allergy status to other drugs, medicaments and biological substances status: Secondary | ICD-10-CM | POA: Insufficient documentation

## 2020-06-29 DIAGNOSIS — Z01812 Encounter for preprocedural laboratory examination: Secondary | ICD-10-CM | POA: Insufficient documentation

## 2020-06-29 HISTORY — DX: Personal history of urinary calculi: Z87.442

## 2020-06-29 HISTORY — PX: CYSTOSCOPY WITH RETROGRADE PYELOGRAM, URETEROSCOPY AND STENT PLACEMENT: SHX5789

## 2020-06-29 HISTORY — DX: Exercise induced bronchospasm: J45.990

## 2020-06-29 LAB — CBC
HCT: 44.3 % (ref 39.0–52.0)
Hemoglobin: 14.2 g/dL (ref 13.0–17.0)
MCH: 29.5 pg (ref 26.0–34.0)
MCHC: 32.1 g/dL (ref 30.0–36.0)
MCV: 92.1 fL (ref 80.0–100.0)
Platelets: 301 10*3/uL (ref 150–400)
RBC: 4.81 MIL/uL (ref 4.22–5.81)
RDW: 14 % (ref 11.5–15.5)
WBC: 9.5 10*3/uL (ref 4.0–10.5)
nRBC: 0 % (ref 0.0–0.2)

## 2020-06-29 SURGERY — CYSTOURETEROSCOPY, WITH RETROGRADE PYELOGRAM AND STENT INSERTION
Anesthesia: General | Laterality: Left

## 2020-06-29 MED ORDER — STERILE WATER FOR IRRIGATION IR SOLN
Status: DC | PRN
  Administered 2020-06-29: 30 mL

## 2020-06-29 MED ORDER — FENTANYL CITRATE (PF) 100 MCG/2ML IJ SOLN: 25.0000 ug | INTRAMUSCULAR | Status: DC | PRN

## 2020-06-29 MED ORDER — DEXAMETHASONE SODIUM PHOSPHATE 10 MG/ML IJ SOLN
INTRAMUSCULAR | Status: AC
  Filled 2020-06-29: qty 1

## 2020-06-29 MED ORDER — SODIUM CHLORIDE 0.9 % IV SOLN
2.0000 g | Freq: Once | INTRAVENOUS | Status: AC
  Administered 2020-06-29: 2 g via INTRAVENOUS

## 2020-06-29 MED ORDER — LIDOCAINE 2% (20 MG/ML) 5 ML SYRINGE
INTRAMUSCULAR | Status: AC
  Filled 2020-06-29: qty 5

## 2020-06-29 MED ORDER — PROPOFOL 10 MG/ML IV BOLUS
INTRAVENOUS | Status: AC
  Filled 2020-06-29: qty 20

## 2020-06-29 MED ORDER — ORAL CARE MOUTH RINSE: 15.0000 mL | Freq: Once | OROMUCOSAL | Status: AC

## 2020-06-29 MED ORDER — ACETAMINOPHEN 10 MG/ML IV SOLN
INTRAVENOUS | Status: AC
  Filled 2020-06-29: qty 100

## 2020-06-29 MED ORDER — PROPOFOL 10 MG/ML IV BOLUS
INTRAVENOUS | Status: DC | PRN
  Administered 2020-06-29: 200 mg via INTRAVENOUS

## 2020-06-29 MED ORDER — KETOROLAC TROMETHAMINE 30 MG/ML IJ SOLN
INTRAMUSCULAR | Status: DC | PRN
  Administered 2020-06-29: 30 mg via INTRAVENOUS

## 2020-06-29 MED ORDER — IOHEXOL 300 MG/ML  SOLN
INTRAMUSCULAR | Status: DC | PRN
  Administered 2020-06-29: 20 mL via URETHRAL

## 2020-06-29 MED ORDER — LACTATED RINGERS IV SOLN: INTRAVENOUS | Status: DC

## 2020-06-29 MED ORDER — DEXAMETHASONE SODIUM PHOSPHATE 4 MG/ML IJ SOLN
INTRAMUSCULAR | Status: DC | PRN
  Administered 2020-06-29: 10 mg via INTRAVENOUS

## 2020-06-29 MED ORDER — MIDAZOLAM HCL 2 MG/2ML IJ SOLN
INTRAMUSCULAR | Status: DC | PRN
  Administered 2020-06-29: 2 mg via INTRAVENOUS

## 2020-06-29 MED ORDER — FENTANYL CITRATE (PF) 100 MCG/2ML IJ SOLN
INTRAMUSCULAR | Status: DC | PRN
  Administered 2020-06-29 (×4): 25 ug via INTRAVENOUS
  Administered 2020-06-29: 50 ug via INTRAVENOUS

## 2020-06-29 MED ORDER — SULFAMETHOXAZOLE-TRIMETHOPRIM 800-160 MG PO TABS: 1.0000 | ORAL_TABLET | Freq: Two times a day (BID) | ORAL | 0 refills | Status: DC

## 2020-06-29 MED ORDER — MIDAZOLAM HCL 2 MG/2ML IJ SOLN
INTRAMUSCULAR | Status: AC
  Filled 2020-06-29: qty 2

## 2020-06-29 MED ORDER — FENTANYL CITRATE (PF) 100 MCG/2ML IJ SOLN
INTRAMUSCULAR | Status: AC
  Filled 2020-06-29: qty 2

## 2020-06-29 MED ORDER — SODIUM CHLORIDE 0.9 % IV SOLN
INTRAVENOUS | Status: AC
  Filled 2020-06-29: qty 20

## 2020-06-29 MED ORDER — LIDOCAINE 2% (20 MG/ML) 5 ML SYRINGE
INTRAMUSCULAR | Status: DC | PRN
  Administered 2020-06-29: 80 mg via INTRAVENOUS

## 2020-06-29 MED ORDER — ACETAMINOPHEN 10 MG/ML IV SOLN
INTRAVENOUS | Status: DC | PRN
  Administered 2020-06-29: 1000 mg via INTRAVENOUS

## 2020-06-29 MED ORDER — SODIUM CHLORIDE 0.9 % IR SOLN
Status: DC | PRN
  Administered 2020-06-29 (×2): 3000 mL

## 2020-06-29 MED ORDER — ONDANSETRON HCL 4 MG/2ML IJ SOLN
INTRAMUSCULAR | Status: DC | PRN
  Administered 2020-06-29: 4 mg via INTRAVENOUS

## 2020-06-29 MED ORDER — GENTAMICIN SULFATE 40 MG/ML IJ SOLN: 5.0000 mg/kg | INTRAVENOUS | Status: DC

## 2020-06-29 MED ORDER — ONDANSETRON HCL 4 MG/2ML IJ SOLN
INTRAMUSCULAR | Status: AC
  Filled 2020-06-29: qty 2

## 2020-06-29 MED ORDER — CHLORHEXIDINE GLUCONATE 0.12 % MT SOLN
15.0000 mL | Freq: Once | OROMUCOSAL | Status: AC
  Administered 2020-06-29: 15 mL via OROMUCOSAL

## 2020-06-29 SURGICAL SUPPLY — 24 items
BAG URO CATCHER STRL LF (MISCELLANEOUS) ×2 IMPLANT
BASKET LASER NITINOL 1.9FR (BASKET) ×1 IMPLANT
BSKT STON RTRVL 120 1.9FR (BASKET) ×1
CATH INTERMIT  6FR 70CM (CATHETERS) ×2 IMPLANT
CLOTH BEACON ORANGE TIMEOUT ST (SAFETY) ×2 IMPLANT
EXTRACTOR STONE 1.7FRX115CM (UROLOGICAL SUPPLIES) IMPLANT
GLOVE SURG ENC TEXT LTX SZ7.5 (GLOVE) ×7 IMPLANT
GOWN STRL REUS W/TWL LRG LVL3 (GOWN DISPOSABLE) ×4 IMPLANT
GUIDEWIRE ANG ZIPWIRE 038X150 (WIRE) ×2 IMPLANT
GUIDEWIRE STR DUAL SENSOR (WIRE) ×2 IMPLANT
KIT TURNOVER KIT A (KITS) ×2 IMPLANT
LASER FIB FLEXIVA PULSE ID 365 (Laser) IMPLANT
LASER FIB FLEXIVA PULSE ID 550 (Laser) IMPLANT
LASER FIB FLEXIVA PULSE ID 910 (Laser) IMPLANT
MANIFOLD NEPTUNE II (INSTRUMENTS) ×2 IMPLANT
PACK CYSTO (CUSTOM PROCEDURE TRAY) ×2 IMPLANT
SHEATH URETERAL 12FRX28CM (UROLOGICAL SUPPLIES) IMPLANT
SHEATH URETERAL 12FRX35CM (MISCELLANEOUS) ×1 IMPLANT
STENT POLARIS 5FRX26 (STENTS) ×1 IMPLANT
TRACTIP FLEXIVA PULS ID 200XHI (Laser) IMPLANT
TRACTIP FLEXIVA PULSE ID 200 (Laser)
TUBE FEEDING 8FR 16IN STR KANG (MISCELLANEOUS) ×2 IMPLANT
TUBING CONNECTING 10 (TUBING) ×2 IMPLANT
TUBING UROLOGY SET (TUBING) ×1 IMPLANT

## 2020-06-29 NOTE — Discharge Instructions (Signed)
1 - You may have urinary urgency (bladder spasms) and bloody urine on / off with stent in place. This is normal. ° °2 - Remove tethered stent on Friday morning at home by pulling on string, then blue-white plastic tubing, and discarding. Office is open Friday if any problems arise.  ° °3 - Call MD or go to ER for fever >102, severe pain / nausea / vomiting not relieved by medications, or acute change in medical status ° °

## 2020-06-29 NOTE — Anesthesia Postprocedure Evaluation (Signed)
Anesthesia Post Note  Patient: Elijah [name]  Procedure(s) Performed: CYSTOSCOPY WITH RETROGRADE PYELOGRAM, URETEROSCOPY , BASKET EXTRACTION, AND STENT PLACEMENT (Left )     Patient location during evaluation: PACU Anesthesia Type: General Level of consciousness: awake and alert Pain management: pain level controlled Vital Signs Assessment: post-procedure vital signs reviewed and stable Respiratory status: spontaneous breathing, nonlabored ventilation and respiratory function stable Cardiovascular status: blood pressure returned to baseline and stable Postop Assessment: no apparent nausea or vomiting Anesthetic complications: no   No complications documented.  Last Vitals:  Vitals:   06/29/20 1345 06/29/20 1400  BP:  (!) 142/82  Pulse: 71 68  Resp: 12 13  Temp:  36.4 C  SpO2: 96% 96%    Last Pain:  Vitals:   06/29/20 1400  TempSrc:   PainSc: 2                  Beverly Ferner,W. EDMOND

## 2020-06-29 NOTE — Op Note (Signed)
NAME: Elijah, YURKOVICH MEDICAL RECORD NO: [id number] ACCOUNT NO: [id number] DATE OF BIRTH: [birth year] FACILITY: [facility] LOCATION: [facility] PHYSICIAN: Alexis Frock, MD  Operative Report   DATE OF PROCEDURE: 06/29/2020  PREOPERATIVE DIAGNOSES:  Left ureteral stone, refractory colic.  POSTOPERATIVE DIAGNOSES:  Left ureteral stone, refractory colic.  PROCEDURES: 1.  Cystoscopy with left retrograde pyelogram and interpretation. 2.  Left ureteroscopy with basketing of stone. 3.  Insertion of left ureteral stent 5 x 26 Polaris with tether.  ESTIMATED BLOOD LOSS:  Nil.  COMPLICATIONS:  None.  SPECIMEN:  Left ureteral/renal stone for composition analysis.  FINDINGS:    1.  Moderate hydronephrosis to the distal left ureter. 2.  Significant mucosal edema in the intramural ureter, likely consistent with recent passage of small distal ureteral stone. 3.  Significant proteinaceous debris within the level of the kidney.  No obvious purulent debris. 4.  Left lower pole renal stone as expected. 5.  Complete resolution of all accessible stone fragments larger than 130 mm following basket extraction. 6.  Distal placement of left ureteral stent, proximal end in the renal pelvis, distal end in the urinary bladder.  INDICATIONS:  The patient is a pleasant 66 year old man with a history of prior urolithiasis and known multifocal left renal stone.  He was found on workup of colicky flank pain to have a clinical presentation consistent with new obstructing stone.  He  underwent a brief trial of medical therapy with alpha blockers and pain medications; however, his colic remained very difficult to control.  Options were discussed for further management including continued medical therapy versus shockwave lithotripsy  versus ureteroscopy.  He wished to proceed with the latter with a goal of stone free given known stone multifocality.  Informed consent was obtained and placed in the medical  record.  DESCRIPTION OF PROCEDURE:  The patient being Elijah [name] verified, the procedure being left ureteroscopy and stent placement was confirmed.  Procedure timeout was performed.  Intravenous antibiotics were administered.  General LMA anesthesia was  induced.  The patient was placed in the low lithotomy position.  Sterile field was created, prepped and draped the penis, perineum, and proximal thighs with iodine.  Cystourethroscopy was performed with a 21-French rigid cystoscope with offset lens.   Inspection of anterior and posterior urethra was unremarkable.  Inspection of bladder revealed no diverticula, calcifications, or papillary lesions.  There was very significant mucosal edema in the area of the left ureteral orifice, which was quite  inflamed.  It was cannulated with a 6-French renal catheter and left retrograde pyelogram was obtained.  Left retrograde pyelogram demonstrated single left ureter with single system left kidney.  There was moderate hydronephrosis to the distal most ureter.  A 0.038 ZIPwire was advanced to lower pole and set side as a safety wire.  An 8-French feeding tube  was placed in the urinary bladder for pressure release.  Semirigid ureteroscopy was performed of the distal orifice of the left ureter alongside a separate sensor working wire.  There was again significant mucosal edema noted within the intramural  ureter, but no obvious stone in this location.  At this point, it was felt to likely be most consistent with either recent stone passage versus retrograde positioning of prior distal ureteral stone.  The semirigid scope was then exchanged for a 12/14  medium length ureteral access sheath to the level of the proximal ureter using fluoroscopic guidance taking exquisite care not to pass the sheath more retrograde more retrograde than visualized portion,  and flexible digital ureteroscopy was performed  using a single channel digital ureteroscope.  Inspection of  the left proximal ureter and systematic inspection of the left kidney, including all calices x3, did reveal a significant amount of proteinaceous debris within the kidney.  This was irrigated  via Luer-Lok syringe.  This did not appear purulent, however.  After clearance of the proteinaceous debris, the kidney was once again much more thoroughly and easily inspected, and there was only a single dominant foci of stone in the lower pole.  This  was consistent with known prior stone on the CT scan.  This did appear amenable to simple basketing.  It was grasped with the escape basket, removed on its long axis and removed and set aside for composite analysis.  Repeat panendoscopic examination of  the left kidney including all calices x2 revealed no additional stone material whatsoever.  The access sheath was removed under continuous vision, and no mucosal abnormalities or additional stone was noted.  Given the significant amount of intramural  mucosal edema likely consistent with prior stone impaction, it was felt that brief interval stenting with a tethered stent will be most prudent.  As such, a new 5 x 26 Polaris type stent was placed over the safety wire using fluoroscopic guidance.  Good  proximal and distal planes were noted.  Tether was left in place, fashioned to the dorsal penis, and the procedure was terminated.  The patient tolerated the procedure well.  There were no immediate perioperative complications.  The patient was taken to  the postanesthesia care unit in stable condition.  Plan for discharge home.   ROH D: 06/29/2020 1:18:26 pm T: 06/29/2020 6:23:00 pm  JOB: [id number]/ [id number]

## 2020-06-29 NOTE — Anesthesia Procedure Notes (Signed)
Procedure Name: LMA Insertion Date/Time: 06/29/2020 12:26 PM Performed by: Claudia Desanctis, CRNA Pre-anesthesia Checklist: Emergency Drugs available, Patient identified, Suction available and Patient being monitored Patient Re-evaluated:Patient Re-evaluated prior to induction Oxygen Delivery Method: Circle system utilized Preoxygenation: Pre-oxygenation with 100% oxygen Induction Type: IV induction Ventilation: Mask ventilation without difficulty LMA: LMA inserted LMA Size: 5.0 Number of attempts: 1 Placement Confirmation: positive ETCO2 and breath sounds checked- equal and bilateral Tube secured with: Tape Dental Injury: Teeth and Oropharynx as per pre-operative assessment

## 2020-06-29 NOTE — Transfer of Care (Signed)
Immediate Anesthesia Transfer of Care Note  Patient: Elijah [name]  Procedure(s) Performed: CYSTOSCOPY WITH RETROGRADE PYELOGRAM, URETEROSCOPY , BASKET EXTRACTION, AND STENT PLACEMENT (Left )  Patient Location: PACU  Anesthesia Type:General  Level of Consciousness: drowsy  Airway & Oxygen Therapy: Patient Spontanous Breathing and Patient connected to face mask  Post-op Assessment: Report given to RN and Post -op Vital signs reviewed and stable  Post vital signs: Reviewed and stable  Last Vitals:  Vitals Value Taken Time  BP 145/83 06/29/20 1325  Temp    Pulse 67 06/29/20 1326  Resp 9 06/29/20 1326  SpO2 100 % 06/29/20 1326  Vitals shown include unvalidated device data.  Last Pain:  Vitals:   06/29/20 1113  TempSrc:   PainSc: 0-No pain      Patients Stated Pain Goal: 4 (75/10/25 8527)  Complications: No complications documented.

## 2020-06-29 NOTE — Anesthesia Preprocedure Evaluation (Addendum)
Anesthesia Evaluation  Patient identified by MRN, date of birth, ID band Patient awake    Reviewed: Allergy & Precautions, H&P , NPO status , Patient's Chart, lab work & pertinent test results  Airway Mallampati: II  TM Distance: >3 FB Neck ROM: Full    Dental no notable dental hx. (+) Teeth Intact, Dental Advisory Given   Pulmonary asthma , sleep apnea and Continuous Positive Airway Pressure Ventilation ,    Pulmonary exam normal breath sounds clear to auscultation       Cardiovascular negative cardio ROS   Rhythm:Regular Rate:Normal     Neuro/Psych  Headaches, negative psych ROS   GI/Hepatic Neg liver ROS, GERD  Medicated,  Endo/Other  negative endocrine ROS  Renal/GU Renal disease  negative genitourinary   Musculoskeletal   Abdominal   Peds  Hematology negative hematology ROS (+)   Anesthesia Other Findings   Reproductive/Obstetrics negative OB ROS                            Anesthesia Physical Anesthesia Plan  ASA: III  Anesthesia Plan: General   Post-op Pain Management:    Induction: Intravenous  PONV Risk Score and Plan: 3 and Ondansetron, Dexamethasone and Midazolam  Airway Management Planned: LMA  Additional Equipment:   Intra-op Plan:   Post-operative Plan: Extubation in OR  Informed Consent: I have reviewed the patients History and Physical, chart, labs and discussed the procedure including the risks, benefits and alternatives for the proposed anesthesia with the patient or authorized representative who has indicated his/her understanding and acceptance.     Dental advisory given  Plan Discussed with: CRNA  Anesthesia Plan Comments:        Anesthesia Quick Evaluation

## 2020-06-29 NOTE — Brief Op Note (Signed)
06/29/2020  1:12 PM  PATIENT:  Elijah [name]  65 y.o. male  PRE-OPERATIVE DIAGNOSIS:  LEFT URETERAL STONES  POST-OPERATIVE DIAGNOSIS:  LEFT URETERAL STONES  PROCEDURE:  Procedure(s) with comments: CYSTOSCOPY WITH RETROGRADE PYELOGRAM, URETEROSCOPY , BASKET EXTRACTION, AND STENT PLACEMENT (Left) - 1 HR  SURGEON:  Surgeon(s) and Role:    * Alexis Frock, MD - Primary  PHYSICIAN ASSISTANT:   ASSISTANTS: none   ANESTHESIA:   general  EBL:  0 mL   BLOOD ADMINISTERED:none  DRAINS: none   LOCAL MEDICATIONS USED:  NONE  SPECIMEN:  Source of Specimen:  left renal / ureteral stone  DISPOSITION OF SPECIMEN:  [facility] for compositional analysis  COUNTS:  YES  TOURNIQUET:  * No tourniquets in log *  DICTATION: .Other Dictation: Dictation Number  [id number]  PLAN OF CARE: Discharge to home after PACU  PATIENT DISPOSITION:  PACU - hemodynamically stable.   Delay start of Pharmacological VTE agent (>24hrs) due to surgical blood loss or risk of bleeding: yes

## 2020-06-29 NOTE — H&P (Signed)
Elijah [name] is an 65 y.o. male.    Chief Complaint: Pre-OP LEFT Ureteroscopic Stone Manipulation  HPI:   1 -Lower Urinary Tract Symptoms - years of progressive bother from mix of irritative and obstructive symptoms. Urgency, nocturia, weak stream. ON finasteride + alfuzosin x years. DRE 2018 60gm+ smooth. PVR 2018 "6 mL" (normal). CT vol "44mL",   2 - Urolithiasis - incidental Left 61mm lower and 3mm upper renal stones on CT 12/2019. Severe colic 07/4096 with left flank pain radiation g to groin and nausea.    PMH sig for knee arthroscopy, HTN. NO ischemic CV disease / blood thinners. NO prior abd surgeries. His PCP is Elijah Contras MD. His wife Elijah [name] is Radio broadcast assistant at [employer].   Today " Elijah [name] " is seen to proceed with LEFT ureteroscopic stone manipulation for refractory colic. Most recetn UA without infectious parameters. No interval high grade fevers or stone passage.     Past Medical History:  Diagnosis Date  . Acid reflux   . Exercise-induced asthma   . History of kidney stones   . Migraine 2009  . Prostate disease   . Sleep apnea     Past Surgical History:  Procedure Laterality Date  . ANKLE SURGERY Right   . CHEST TUBE INSERTION Left   . COLONOSCOPY    . KNEE SURGERY Right   . SKIN CANCER EXCISION    . TONSILLECTOMY    . VASECTOMY    . WISDOM TOOTH EXTRACTION      History reviewed. No pertinent family history. Social History:  reports that he has never smoked. He has never used smokeless tobacco. He reports current alcohol use. He reports that he does not use drugs.  Allergies:  Allergies  Allergen Reactions  . Singulair [Montelukast] Hives  . Crestor [Rosuvastatin] Other (See Comments)    Muscle Aches     No medications prior to admission.    Results for orders placed or performed during the hospital encounter of 06/28/20 (from the past 48 hour(s))  SARS CORONAVIRUS 2 (TAT 6-24 HRS) Nasopharyngeal Nasopharyngeal Swab     Status: None   Collection Time:  06/28/20 10:07 AM   Specimen: Nasopharyngeal Swab  Result Value Ref Range   SARS Coronavirus 2 NEGATIVE NEGATIVE    Comment: (NOTE) SARS-CoV-2 target nucleic acids are NOT DETECTED.  The SARS-CoV-2 RNA is generally detectable in upper and lower respiratory specimens during the acute phase of infection. Negative results do not preclude SARS-CoV-2 infection, do not rule out co-infections with other pathogens, and should not be used as the sole basis for treatment or other patient management decisions. Negative results must be combined with clinical observations, patient history, and epidemiological information. The expected result is Negative.  Fact Sheet for Patients: SugarRoll.be  Fact Sheet for Healthcare Providers: https://www.woods-mathews.com/  This test is not yet approved or cleared by the [place] FDA and  has been authorized for detection and/or diagnosis of SARS-CoV-2 by FDA under an Emergency Use Authorization (EUA). This EUA will remain  in effect (meaning this test can be used) for the duration of the COVID-19 declaration under Se ction 564(b)(1) of the Act, 21 U.S.C. section 360bbb-3(b)(1), unless the authorization is terminated or revoked sooner.  Performed at [facility], [place] [address], [place], [place] [address]    No results found.  Review of Systems  Constitutional: Negative for chills and fever.  Genitourinary: Positive for flank pain.  All other systems reviewed and are negative.  Height 6\' 1"  (1.854 m), weight 104.3 kg. Physical Exam Vitals reviewed.  HENT:     Head: Normocephalic.     Nose: Nose normal.     Mouth/Throat:     Mouth: Mucous membranes are moist.  Cardiovascular:     Rate and Rhythm: Normal rate.     Pulses: Normal pulses.  Pulmonary:     Effort: Pulmonary effort is normal.  Genitourinary:    Comments: Moderate left CVAT at present.  Musculoskeletal:        General:  Normal range of motion.     Cervical back: Normal range of motion.  Skin:    General: Skin is warm.  Neurological:     General: No focal deficit present.     Mental Status: He is alert.      Assessment/Plan  Proceed as planned with LEFT ureteroscopic stone manipulaiton with goal of stone free. Risks, benefits, alternatives, expected peri-op course discused previously and reiterated today.   Alexis Frock, MD 06/29/2020, 8:06 AM

## 2020-06-30 ENCOUNTER — Encounter (HOSPITAL_COMMUNITY): Payer: Self-pay | Admitting: Urology

## 2021-07-28 NOTE — Progress Notes (Signed)
?Cardiology Office Note:   ? ?Date:  07/30/2021  ? ?ID:  Elijah [name], DOB [birth year], MRN [id number] ? ?PCP:  [name], MD  ?Cardiologist:  Sinclair Grooms, MD  ? ?Referring MD: Caren Macadam, MD  ? ?Chief Complaint  ?Patient presents with  ? Shortness of Breath  ? Advice Only  ?  DOE ?Elevated blood pressure  ? Hyperlipidemia  ? ? ?History of Present Illness:   ? ?Elijah [name] is a 66 y.o. male with a hx of prediabetes, OSA, Prostate CA, ED, hyperlipidemia, who is referred by Caren Macadam, MD for chest pain. ? ?Samel has a family history of CAD with father dying of myocardial infarction at age 40 (he was a smoker) and mother died at age 39 of myocardial infarction.  Also had a history of atrial fibrillation.  The patient has a personal history of hyperlipidemia, not treated, prediabetes, and and documented elevated blood pressure today. ? ?Approximately 2 months ago he had an episode where he awakened having pounding and irregular heartbeat.  It went on intermittently for few hours before resolving.  His wife felt his pulse and felt it was irregular but did not feel like atrial fibrillation. ? ?He has always enjoyed playing soccer and now after a 3-year hiatus, he is preparing to play in the summer league.  He has become concerned because he notices particularly in the evenings that he gets short of breath climbing 1 flight of stairs in his house.  This is relatively new.  No instance of chest discomfort associated with it.  No arrhythmia.   ? ? ? ?Past Medical History:  ?Diagnosis Date  ? Acid reflux   ? Allergic rhinitis   ? Asthma   ? Bloating   ? BPH (benign prostatic hyperplasia)   ? Depression   ? ED (erectile dysfunction)   ? Epigastric pain   ? Exercise-induced asthma   ? GERD (gastroesophageal reflux disease)   ? History of kidney stones   ? Hypercholesteremia   ? Migraine 2009  ? Prediabetes   ? Prostate disease   ? Sleep apnea   ? ? ?Past Surgical History:  ?Procedure Laterality Date  ?  ANKLE SURGERY Right   ? CHEST TUBE INSERTION Left   ? COLONOSCOPY    ? CYSTOSCOPY WITH RETROGRADE PYELOGRAM, URETEROSCOPY AND STENT PLACEMENT Left 06/29/2020  ? Procedure: CYSTOSCOPY WITH RETROGRADE PYELOGRAM, URETEROSCOPY , BASKET EXTRACTION, AND STENT PLACEMENT;  Surgeon: Alexis Frock, MD;  Location: [facility];  Service: Urology;  Laterality: Left;  1 HR  ? KNEE SURGERY Right   ? SKIN CANCER EXCISION    ? TONSILLECTOMY    ? VASECTOMY    ? WISDOM TOOTH EXTRACTION    ? ? ?Current Medications: ?Current Meds  ?Medication Sig  ? albuterol (PROVENTIL) (2.5 MG/3ML) 0.083% nebulizer solution Take 2.5 mg by nebulization every 6 (six) hours as needed for wheezing or shortness of breath.  ? alfuzosin (UROXATRAL) 10 MG 24 hr tablet Take 10 mg by mouth daily with breakfast.  ? b complex vitamins capsule Take 1 capsule by mouth daily.  ? BLACK ELDERBERRY PO Take 1,150 mg by mouth daily.  ? CINNAMON PO Take 1 capsule by mouth daily.  ? Multiple Vitamins-Minerals (MULTIVITAMIN WITH MINERALS) tablet Take 1 tablet by mouth daily.  ?  ? ?Allergies:   Finasteride, Singulair [montelukast], and Crestor [rosuvastatin]  ? ?Social History  ? ?Socioeconomic History  ? Marital status: Married  ?  Spouse name: Not  on file  ? Number of children: Not on file  ? Years of education: Not on file  ? Highest education level: Not on file  ?Occupational History  ? Not on file  ?Tobacco Use  ? Smoking status: Never  ? Smokeless tobacco: Never  ?Vaping Use  ? Vaping Use: Never used  ?Substance and Sexual Activity  ? Alcohol use: Yes  ?  Comment: social  ? Drug use: Never  ? Sexual activity: Not on file  ?Other Topics Concern  ? Not on file  ?Social History Narrative  ? Not on file  ? ?Social Determinants of Health  ? ?Financial Resource Strain: Not on file  ?Food Insecurity: Not on file  ?Transportation Needs: Not on file  ?Physical Activity: Not on file  ?Stress: Not on file  ?Social Connections: Not on file  ?  ? ?Family History: ?The patient's family  history is not on file. ? ?ROS:   ?Please see the history of present illness.    ?Not on any current medical therapy other than alfuzosin (Uroxatrol), and Proventil.  Has exercise-induced asthma.  All other systems reviewed and are negative. ? ?EKGs/Labs/Other Studies Reviewed:   ? ?The following studies were reviewed today: ?No cardiac imaging ? ?EKG:  EKG normal sinus rhythm with a single PAC.  Vertical axis. ? ?Recent Labs: ?No results found for requested labs within last 8760 hours.  ?Recent Lipid Panel ?No results found for: CHOL, TRIG, HDL, CHOLHDL, VLDL, LDLCALC, LDLDIRECT ? ?Physical Exam:   ? ?VS:  BP 128/88   Pulse 66   Ht '6\' 1"'$  (1.854 m)   Wt 239 lb 3.2 oz (108.5 kg)   SpO2 96%   BMI 31.56 kg/m?    ? ?Wt Readings from Last 3 Encounters:  ?07/30/21 239 lb 3.2 oz (108.5 kg)  ?06/29/20 236 lb 3.2 oz (107.1 kg)  ?06/21/20 232 lb (105.2 kg)  ?  ? ?GEN: Overweight. No acute distress ?HEENT: Normal ?NECK: No JVD. ?LYMPHATICS: No lymphadenopathy ?CARDIAC: No murmur. RRR S4 but no S3 gallop, or edema. ?VASCULAR:  Normal Pulses. No bruits. ?RESPIRATORY:  Clear to auscultation without rales, wheezing or rhonchi  ?ABDOMEN: Soft, non-tender, non-distended, No pulsatile mass, ?MUSCULOSKELETAL: No deformity  ?SKIN: Warm and dry ?NEUROLOGIC:  Alert and oriented x 3 ?PSYCHIATRIC:  Normal affect  ? ?ASSESSMENT:   ? ?1. Dyspnea on exertion   ?2. Elevated blood pressure reading in office with white coat syndrome, without diagnosis of hypertension   ?3. Type 2 diabetes mellitus without complication, without long-term current use of insulin ([place])   ?4. Hyperlipidemia LDL goal <70   ?5. OSA (obstructive sleep apnea)   ? ?PLAN:   ? ?In order of problems listed above: ? ?Exercise treadmill test to identify arrhythmia, blood pressure response to exercise, and to rule out ischemia. ?Exercise treadmill test will help determine if he truly has hypertension.  Recent recordings by the patient suggests lability but frequently has  blood pressures in the 150/90 mmHg range. ?Hemoglobin A1c is elevated. ?Most recent LDL was 144.  Coronary calcium score will be obtained. ?Compliant with CPAP. ? ?Dyspnea could be an ischemic equivalent or manifestation of poorly controlled blood pressure.  Exercise treadmill test would be most useful in helping to figure out what if any further evaluation needs to be done.  If he has significant calcium he will need to start treating every risk factor to avoid downstream difficulty.  May need to wear a monitor since he had some palpitation.  May need an echocardiogram as well.  We will see where the initial evaluation leads. ? ? ?Medication Adjustments/Labs and Tests Ordered: ?Current medicines are reviewed at length with the patient today.  Concerns regarding medicines are outlined above.  ?Orders Placed This Encounter  ?Procedures  ? CT CARDIAC SCORING (SELF PAY ONLY)  ? Exercise Tolerance Test  ? EKG 12-Lead  ? ?No orders of the defined types were placed in this encounter. ? ? ?Patient Instructions  ?Medication Instructions:  ?Your physician recommends that you continue on your current medications as directed. Please refer to the Current Medication list given to you today. ? ?*If you need a refill on your cardiac medications before your next appointment, please call your pharmacy* ? ?Lab Work: ?NONE ? ?Testing/Procedures: ?Your physician has recommended for you to have a exercise tolerance test with treadmill performed. ? ?Your physician has recommended for you to have a Coronary Calcium Score performed. ? ?Follow-Up: ?At [facility], you and your health needs are our priority.  As part of our continuing mission to provide you with exceptional heart care, we have created designated Provider Care Teams.  These Care Teams include your primary Cardiologist (physician) and Advanced Practice Providers (APPs -  Physician Assistants and Nurse Practitioners) who all work together to provide you with the care you  need, when you need it. ? ?We recommend signing up for the patient portal called "MyChart".  Sign up information is provided on this After Visit Summary.  MyChart is used to connect with patients for [employer]

## 2021-07-30 ENCOUNTER — Encounter: Payer: Self-pay | Admitting: Interventional Cardiology

## 2021-07-30 ENCOUNTER — Ambulatory Visit: Payer: BC Managed Care – PPO | Admitting: Interventional Cardiology

## 2021-07-30 VITALS — BP 128/88 | HR 66 | Ht 73.0 in | Wt 239.2 lb

## 2021-07-30 DIAGNOSIS — E785 Hyperlipidemia, unspecified: Secondary | ICD-10-CM | POA: Diagnosis not present

## 2021-07-30 DIAGNOSIS — E119 Type 2 diabetes mellitus without complications: Secondary | ICD-10-CM

## 2021-07-30 DIAGNOSIS — G4733 Obstructive sleep apnea (adult) (pediatric): Secondary | ICD-10-CM

## 2021-07-30 DIAGNOSIS — R030 Elevated blood-pressure reading, without diagnosis of hypertension: Secondary | ICD-10-CM | POA: Diagnosis not present

## 2021-07-30 DIAGNOSIS — R079 Chest pain, unspecified: Secondary | ICD-10-CM

## 2021-07-30 DIAGNOSIS — R0609 Other forms of dyspnea: Secondary | ICD-10-CM

## 2021-07-30 NOTE — Patient Instructions (Addendum)
Medication Instructions:  ?Your physician recommends that you continue on your current medications as directed. Please refer to the Current Medication list given to you today. ? ?*If you need a refill on your cardiac medications before your next appointment, please call your pharmacy* ? ?Lab Work: ?NONE ? ?Testing/Procedures: ?Your physician has recommended for you to have a exercise tolerance test with treadmill performed. ? ?Your physician has recommended for you to have a Coronary Calcium Score performed. ? ?Follow-Up: ?At [facility], you and your health needs are our priority.  As part of our continuing mission to provide you with exceptional heart care, we have created designated Provider Care Teams.  These Care Teams include your primary Cardiologist (physician) and Advanced Practice Providers (APPs -  Physician Assistants and Nurse Practitioners) who all work together to provide you with the care you need, when you need it. ? ?We recommend signing up for the patient portal called "MyChart".  Sign up information is provided on this After Visit Summary.  MyChart is used to connect with patients for Virtual Visits (Telemedicine).  Patients are able to view lab/test results, encounter notes, upcoming appointments, etc.  Non-urgent messages can be sent to your provider as well.   ?To learn more about what you can do with MyChart, go to [email or web address].   ? ?Your next appointment:   ?As needed depending on results of tests ? ?The format for your next appointment:   ?In Person ? ?Provider:   ?Sinclair Grooms, MD { ? ? ?Important Information About Sugar ? ? ? ? ?  ?

## 2021-08-03 ENCOUNTER — Other Ambulatory Visit: Payer: Self-pay | Admitting: Interventional Cardiology

## 2021-08-03 DIAGNOSIS — R0609 Other forms of dyspnea: Secondary | ICD-10-CM

## 2021-08-03 NOTE — Progress Notes (Signed)
Atteststation for ETT w/treadmill added. Pended for Dr. Thompson Caul signature. ?

## 2021-08-16 ENCOUNTER — Ambulatory Visit
Admission: RE | Admit: 2021-08-16 | Discharge: 2021-08-16 | Disposition: A | Discharge status: Home / Self Care | Payer: Self-pay | Source: Ambulatory Visit | Attending: Interventional Cardiology | Admitting: Interventional Cardiology

## 2021-08-16 ENCOUNTER — Ambulatory Visit (INDEPENDENT_AMBULATORY_CARE_PROVIDER_SITE_OTHER): Payer: BC Managed Care – PPO

## 2021-08-16 DIAGNOSIS — R0609 Other forms of dyspnea: Secondary | ICD-10-CM

## 2021-08-16 LAB — EXERCISE TOLERANCE TEST
Angina Index: 0
Duke Treadmill Score: -3
Estimated workload: 9.1
Exercise duration (min): 7 min
Exercise duration (sec): 22 s
MPHR: 156 {beats}/min
Peak HR: 139 {beats}/min
Percent HR: 89 %
RPE: 15
Rest HR: 77 {beats}/min
ST Depression (mm): 2 mm

## 2021-08-22 ENCOUNTER — Telehealth: Payer: Self-pay

## 2021-08-22 NOTE — Telephone Encounter (Signed)
-----   Message from Belva Crome, MD sent at 08/20/2021 11:33 AM EDT ----- Let the patient know the stress test is abnormal with marked increase in BP, abnormal ECG with exercise, and calcium score of 0 feel we should start Toprol XL to prevent recurrent increase in HR and better control BP. Needs clinical f/u with me. A copy will be sent to Stacie Glaze, DO

## 2021-08-22 NOTE — Telephone Encounter (Signed)
Patient called to discuss results of echo and ETT.  Per Dr. Tamala Julian: Let the patient know the stress test is abnormal with marked increase in BP, abnormal ECG with exercise, and calcium score of 0 feel we should start Toprol XL to prevent recurrent increase in HR and better control BP. Needs clinical f/u with me.  Clarification from Dr. Tamala Julian: Toprol XL '25mg'$  QD.  Patient verbalized understanding of results. He reports he has not had any episodes of elevated HR. He reports BP ranges 631-497 systolic, 02-63 diastolic. HR averages 70's.  Patient's wife heard in the background stating she didn't tolerate metoprolol well, and patient then asked if he could try a different medication.  Will forward to Dr. Tamala Julian to review and advise.

## 2021-08-28 MED ORDER — METOPROLOL SUCCINATE ER 25 MG PO TB24: 25.0000 mg | ORAL_TABLET | Freq: Every day | ORAL | 3 refills | Status: DC

## 2021-08-28 NOTE — Telephone Encounter (Signed)
Spoke with patient regarding Dr. Thompson Caul recommendation to start Toprol XL '25mg'$  QD for BP/HR control.  Patient states he will try it and let us know if he has any side effects and needs to stop medication.  Toprol XL '25mg'$  QD sent to pharmacy of choice. Patient verbalized understanding.

## 2021-08-28 NOTE — Addendum Note (Signed)
Addended by: Molli Barrows on: 08/28/2021 12:10 PM   Modules accepted: Orders

## 2021-09-15 ENCOUNTER — Encounter: Payer: Self-pay | Admitting: Interventional Cardiology

## 2021-09-18 MED ORDER — AMLODIPINE BESYLATE 5 MG PO TABS: 5.0000 mg | ORAL_TABLET | Freq: Every day | ORAL | 3 refills | Status: DC

## 2021-09-18 NOTE — Addendum Note (Signed)
Addended by: Molli Barrows on: 09/18/2021 03:29 PM   Modules accepted: Orders

## 2021-11-26 ENCOUNTER — Encounter: Payer: Self-pay | Admitting: Interventional Cardiology

## 2022-05-24 ENCOUNTER — Other Ambulatory Visit (HOSPITAL_COMMUNITY): Payer: Self-pay

## 2022-05-24 MED ORDER — NAPROXEN 500 MG PO TABS
ORAL_TABLET | ORAL | 0 refills | Status: DC
Start: 2022-05-24 — End: 2023-04-23
  Filled 2022-05-24: qty 60, 30d supply, fill #0

## 2022-05-27 ENCOUNTER — Other Ambulatory Visit (HOSPITAL_COMMUNITY): Payer: Self-pay

## 2022-06-19 IMAGING — CT CT ABD-PELV W/O CM
1 of 2 series · 13 of 32 positions shown, 18 images · non-contrast
Comparison: 02/11/2017

CLINICAL DATA: Upper abdominal pain and nausea.

EXAM:
CT ABDOMEN AND PELVIS WITHOUT CONTRAST
TECHNIQUE: Multidetector CT imaging of the abdomen and pelvis was performed
following the standard protocol without IV contrast.

[Series 2: abd/pelvis w/(date) · axial · 0.87mm/px · z∈[-590,-144]mm · 13 of 101 slices shown, 18 images]
[im 6/101  soft-tissue]
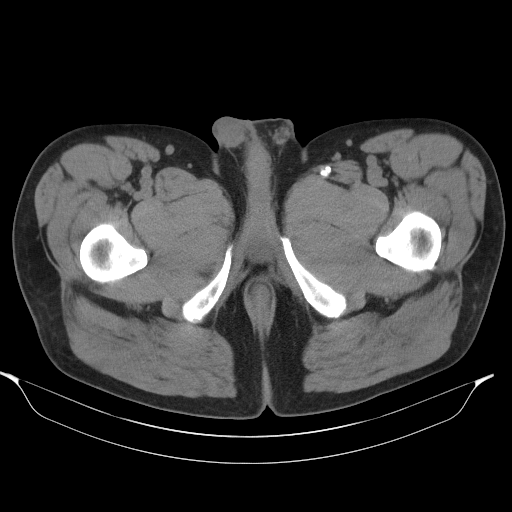
[im 6/101  bone]
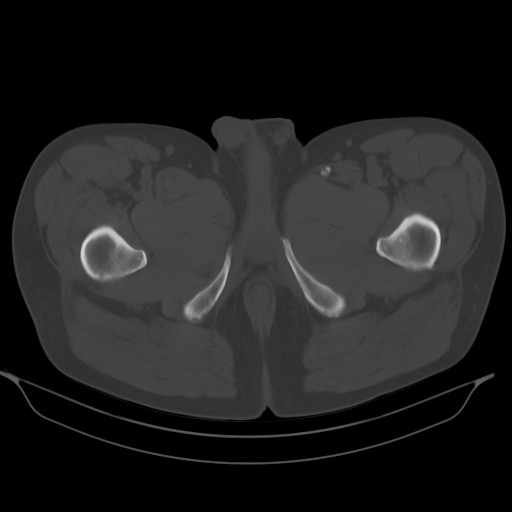
[im 16/101  soft-tissue]
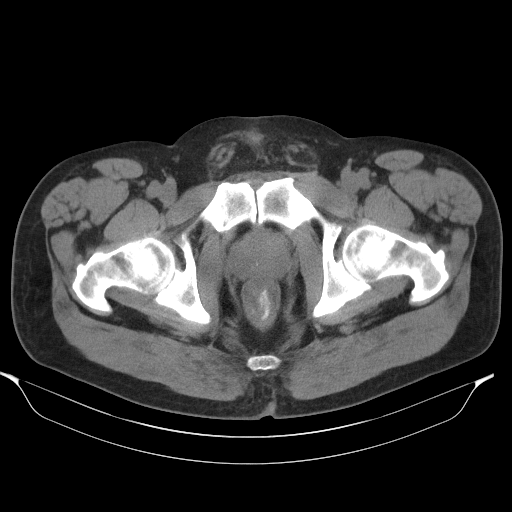
[im 22/101  soft-tissue]
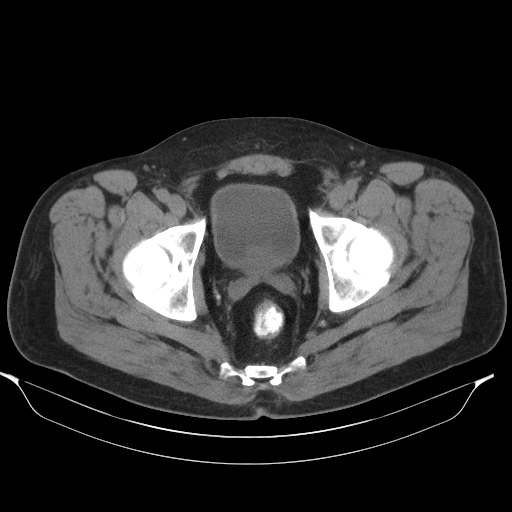
[im 32/101  soft-tissue]
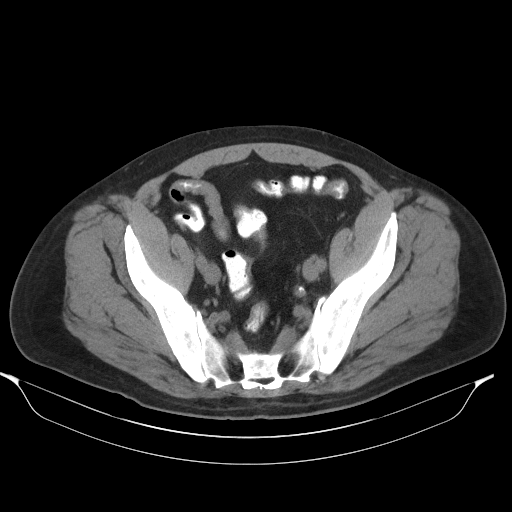
[im 37/101  soft-tissue]
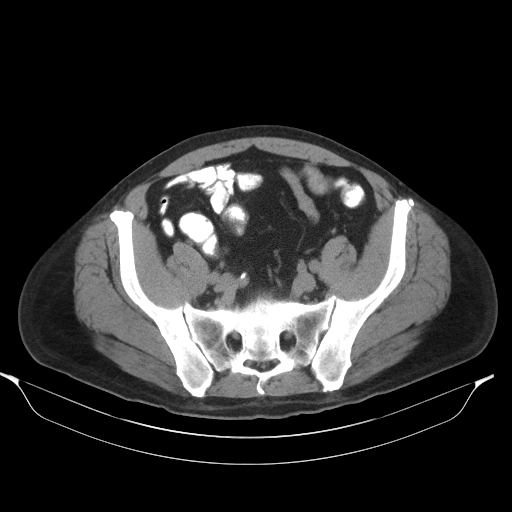
[im 48/101  soft-tissue]
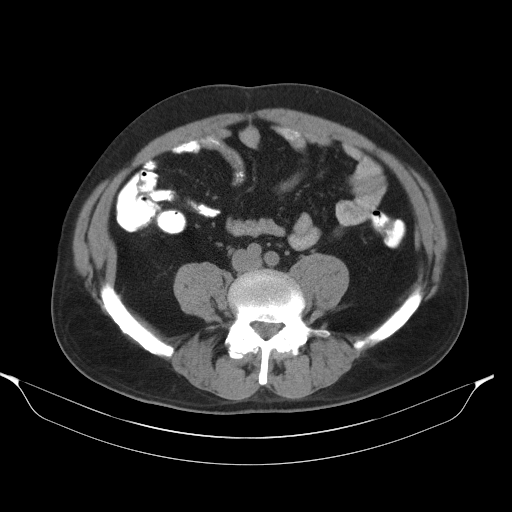
[im 53/101  soft-tissue]
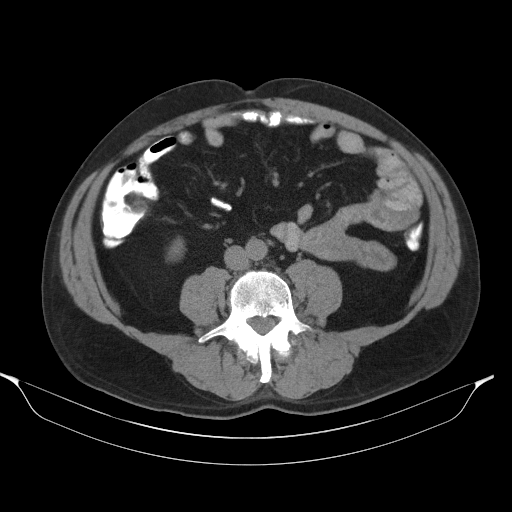
[im 64/101  soft-tissue]
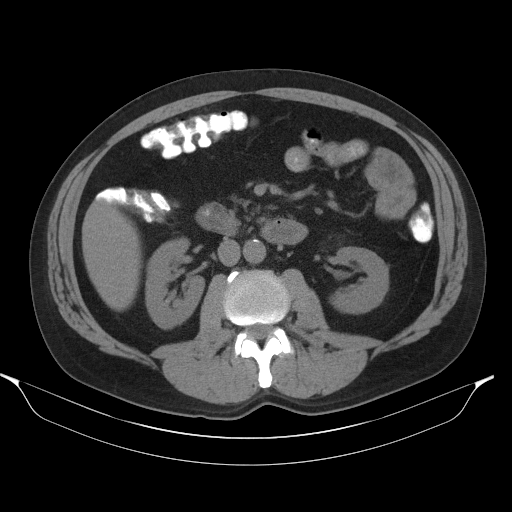
[im 69/101  soft-tissue]
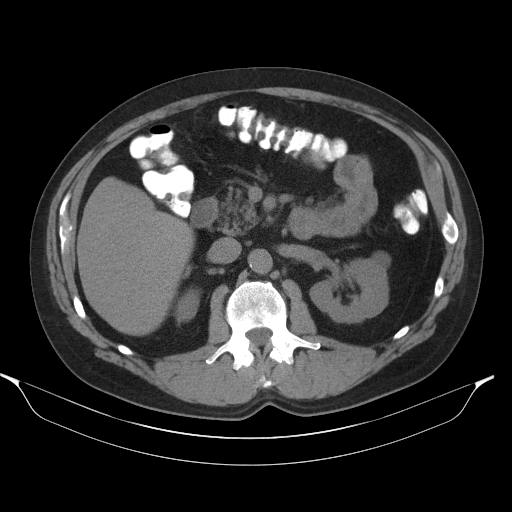
[im 69/101  bone]
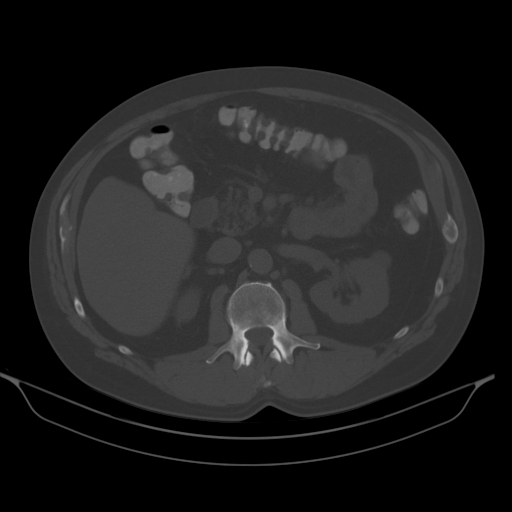
[im 79/101  soft-tissue]
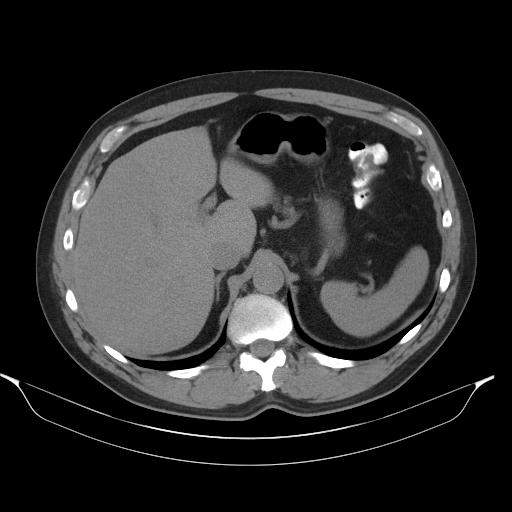
[im 79/101  lung]
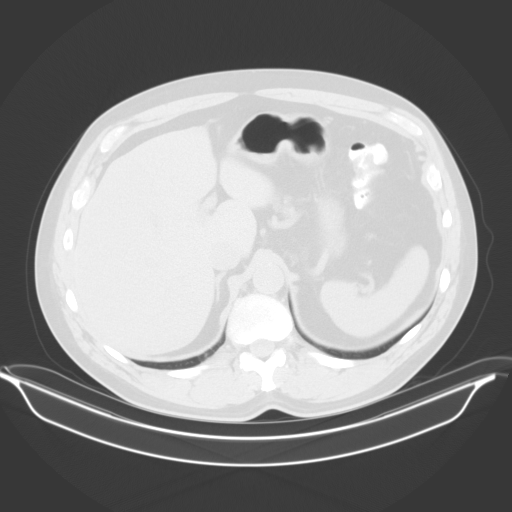
[im 85/101  soft-tissue]
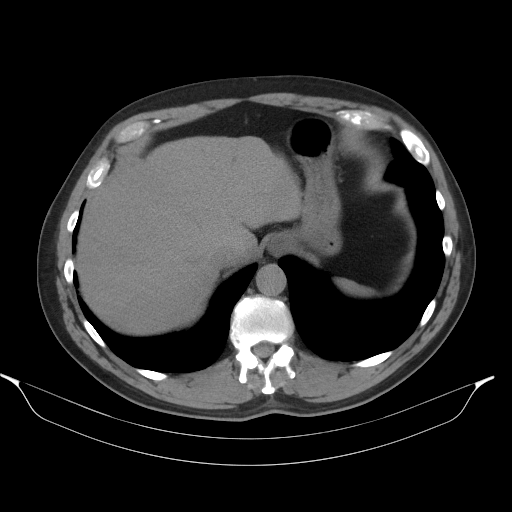
[im 85/101  lung]
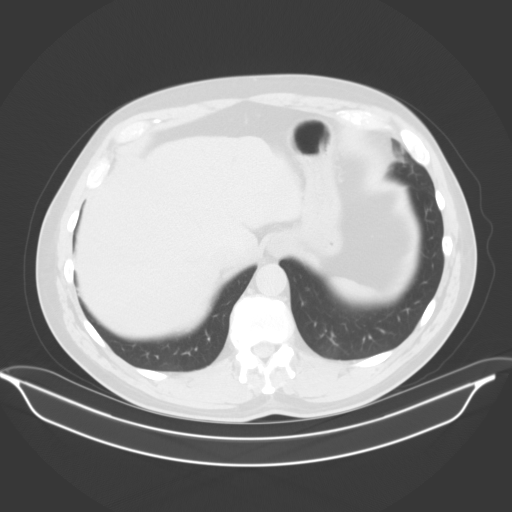
[im 90/101  lung]
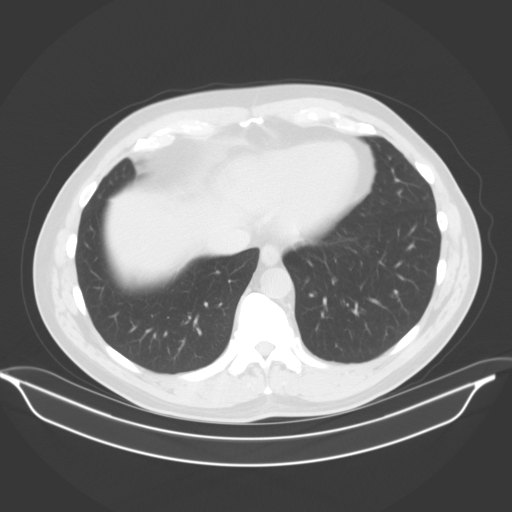
[im 95/101  soft-tissue]
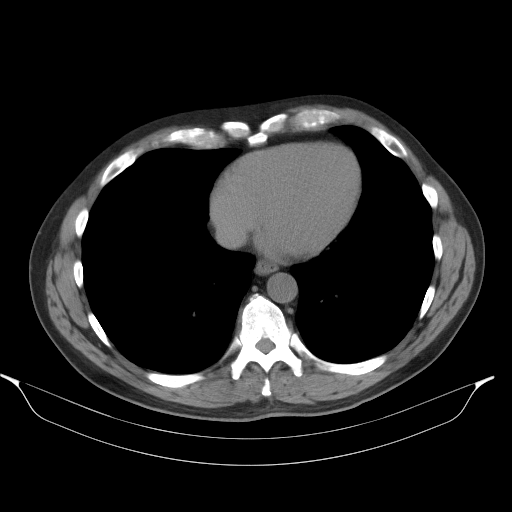
[im 95/101  lung]
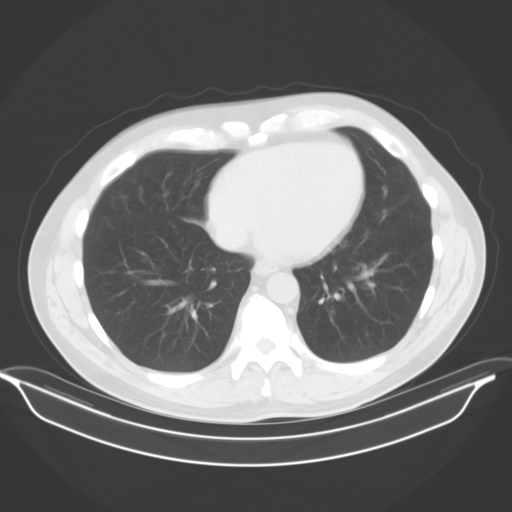

[13 of 32 positions shown; findings below may reference images not displayed]

FINDINGS: Lower chest: Unremarkable.

Hepatobiliary: The liver shows diffusely decreased attenuation
suggesting fat deposition. There is no evidence for gallstones,
gallbladder wall thickening, or pericholecystic fluid. No
intrahepatic or extrahepatic biliary dilation.

Pancreas: No focal mass lesion. No dilatation of the main duct. No
intraparenchymal cyst. No peripancreatic edema.

Spleen: No splenomegaly. No focal mass lesion.

Adrenals/Urinary Tract: No adrenal nodule or mass. Small cyst noted
lower pole right kidney. 4 mm nonobstructing stone identified lower
pole left kidney. 3.3 x 2.0 cm ill-defined exophytic lesion anterior
lower pole left kidney contains fat and soft tissue elements,
compatible with angiomyolipoma. This is stable since previous study
of 3 years ago. 2.2 cm exophytic cyst upper pole left kidney is
unchanged. 2 mm nonobstructing stone noted interpolar left kidney
with 4 mm nonobstructing stone identified in the lower pole. No
evidence for hydroureter. The urinary bladder appears normal for the
degree of distention.

Stomach/Bowel: Stomach is unremarkable. No gastric wall thickening.
No evidence of outlet obstruction. Duodenum is normally positioned
as is the ligament of Treitz. No small bowel wall thickening. No
small bowel dilatation. The terminal ileum is normal. The appendix
is normal. No gross colonic mass. No colonic wall thickening.

Vascular/Lymphatic: There is abdominal aortic atherosclerosis
without aneurysm. There is no gastrohepatic or hepatoduodenal
ligament lymphadenopathy. No retroperitoneal or mesenteric
lymphadenopathy. No pelvic sidewall lymphadenopathy.

Reproductive: The prostate gland and seminal vesicles are
unremarkable.

Other: No intraperitoneal free fluid.

Musculoskeletal: No worrisome lytic or sclerotic osseous
abnormality.
IMPRESSION: 1. No acute findings in the abdomen or pelvis. Specifically, no
findings to explain the patient's history of upper abdominal pain
with nausea and vomiting.
2. Hepatic steatosis.
3. Nonobstructing left renal stones. Bilateral renal cysts with
stable left renal angiomyolipoma.
4. Aortic Atherosclerosis ([id number]).

## 2022-06-25 ENCOUNTER — Other Ambulatory Visit (HOSPITAL_COMMUNITY): Payer: Self-pay

## 2022-06-26 ENCOUNTER — Other Ambulatory Visit: Payer: Self-pay

## 2022-06-26 ENCOUNTER — Other Ambulatory Visit (HOSPITAL_COMMUNITY): Payer: Self-pay

## 2022-06-26 MED ORDER — SILDENAFIL CITRATE 20 MG PO TABS
40.0000 mg | ORAL_TABLET | ORAL | 0 refills | Status: DC | PRN
Start: 2022-06-24 — End: 2023-06-03
  Filled 2022-06-26: qty 90, 18d supply, fill #0

## 2022-06-26 MED ORDER — ALBUTEROL SULFATE HFA 108 (90 BASE) MCG/ACT IN AERS
2.0000 | INHALATION_SPRAY | RESPIRATORY_TRACT | 0 refills | Status: DC | PRN
  Filled 2022-06-26: qty 6.7, 16d supply, fill #0

## 2022-08-13 ENCOUNTER — Other Ambulatory Visit (HOSPITAL_COMMUNITY): Payer: Self-pay

## 2022-08-13 MED ORDER — TADALAFIL (PAH) 20 MG PO TABS
20.0000 mg | ORAL_TABLET | ORAL | 5 refills | Status: DC
  Filled 2022-08-13: qty 20, 60d supply, fill #0

## 2022-10-29 ENCOUNTER — Other Ambulatory Visit (HOSPITAL_COMMUNITY): Payer: Self-pay

## 2022-10-29 MED ORDER — PREDNISONE 20 MG PO TABS
ORAL_TABLET | ORAL | 0 refills | Status: DC
  Filled 2022-10-29: qty 13, 6d supply, fill #0
  Filled 2022-10-29: qty 2, 1d supply, fill #0

## 2022-11-05 ENCOUNTER — Other Ambulatory Visit (HOSPITAL_COMMUNITY): Payer: Self-pay

## 2022-11-05 MED ORDER — PREDNISONE 20 MG PO TABS
20.0000 mg | ORAL_TABLET | Freq: Every day | ORAL | 0 refills | Status: DC
  Filled 2022-11-05: qty 10, 10d supply, fill #0

## 2022-11-20 ENCOUNTER — Other Ambulatory Visit (HOSPITAL_COMMUNITY): Payer: Self-pay

## 2022-11-20 MED ORDER — PREDNISONE 20 MG PO TABS
ORAL_TABLET | ORAL | 0 refills | Status: AC
Start: 2022-11-20 — End: 2022-11-29
  Filled 2022-11-20: qty 18, 9d supply, fill #0

## 2022-12-11 ENCOUNTER — Other Ambulatory Visit (HOSPITAL_COMMUNITY): Payer: Self-pay

## 2022-12-11 ENCOUNTER — Other Ambulatory Visit: Payer: Self-pay

## 2022-12-11 MED ORDER — MINOXIDIL 2.5 MG PO TABS
2.5000 mg | ORAL_TABLET | Freq: Every day | ORAL | 1 refills | Status: DC
  Filled 2022-12-11: qty 90, 90d supply, fill #0
  Filled 2023-03-11: qty 90, 90d supply, fill #1

## 2022-12-31 ENCOUNTER — Other Ambulatory Visit (HOSPITAL_COMMUNITY): Payer: Self-pay

## 2022-12-31 MED ORDER — PREDNISONE 20 MG PO TABS
20.0000 mg | ORAL_TABLET | Freq: Every day | ORAL | 0 refills | Status: DC
  Filled 2022-12-31: qty 30, 30d supply, fill #0

## 2023-01-01 ENCOUNTER — Other Ambulatory Visit (HOSPITAL_COMMUNITY): Payer: Self-pay

## 2023-01-02 ENCOUNTER — Other Ambulatory Visit (HOSPITAL_COMMUNITY): Payer: Self-pay

## 2023-01-02 ENCOUNTER — Encounter (HOSPITAL_COMMUNITY): Payer: Self-pay

## 2023-01-02 ENCOUNTER — Other Ambulatory Visit: Payer: Self-pay

## 2023-01-03 ENCOUNTER — Other Ambulatory Visit: Payer: Self-pay

## 2023-01-03 ENCOUNTER — Other Ambulatory Visit (HOSPITAL_COMMUNITY): Payer: Self-pay

## 2023-01-03 MED ORDER — ALFUZOSIN HCL ER 10 MG PO TB24
10.0000 mg | ORAL_TABLET | Freq: Every day | ORAL | 1 refills | Status: DC
Start: 2023-01-03 — End: 2023-07-25
  Filled 2023-01-03: qty 90, 90d supply, fill #0
  Filled 2023-04-01: qty 90, 90d supply, fill #1

## 2023-01-07 ENCOUNTER — Other Ambulatory Visit (HOSPITAL_COMMUNITY): Payer: Self-pay

## 2023-01-13 ENCOUNTER — Other Ambulatory Visit (HOSPITAL_COMMUNITY): Payer: Self-pay

## 2023-01-13 MED ORDER — PREDNISONE 20 MG PO TABS
40.0000 mg | ORAL_TABLET | Freq: Every day | ORAL | 0 refills | Status: DC
  Filled 2023-01-13: qty 60, 30d supply, fill #0

## 2023-01-20 ENCOUNTER — Other Ambulatory Visit: Payer: Self-pay

## 2023-01-20 ENCOUNTER — Other Ambulatory Visit (HOSPITAL_COMMUNITY): Payer: Self-pay

## 2023-01-20 MED ORDER — PREDNISONE 10 MG PO TABS
30.0000 mg | ORAL_TABLET | Freq: Every day | ORAL | 0 refills | Status: DC
Start: 2023-01-20 — End: 2023-04-10
  Filled 2023-01-20: qty 90, 30d supply, fill #0

## 2023-01-28 ENCOUNTER — Other Ambulatory Visit (HOSPITAL_COMMUNITY): Payer: Self-pay

## 2023-01-28 MED ORDER — PREDNISONE 5 MG PO TABS
30.0000 mg | ORAL_TABLET | Freq: Every day | ORAL | 0 refills | Status: DC
  Filled 2023-01-28 (×2): qty 180, 30d supply, fill #0

## 2023-02-03 ENCOUNTER — Other Ambulatory Visit (HOSPITAL_COMMUNITY): Payer: Self-pay

## 2023-02-07 ENCOUNTER — Other Ambulatory Visit (HOSPITAL_COMMUNITY): Payer: Self-pay

## 2023-02-11 ENCOUNTER — Other Ambulatory Visit: Payer: Self-pay

## 2023-02-11 ENCOUNTER — Other Ambulatory Visit (HOSPITAL_COMMUNITY): Payer: Self-pay

## 2023-02-11 MED ORDER — FINASTERIDE 5 MG PO TABS
5.0000 mg | ORAL_TABLET | Freq: Every day | ORAL | 3 refills | Status: DC
  Filled 2023-02-11: qty 90, 90d supply, fill #0
  Filled 2023-05-21: qty 90, 90d supply, fill #1
  Filled 2023-08-17: qty 90, 90d supply, fill #2

## 2023-02-24 ENCOUNTER — Other Ambulatory Visit: Payer: Self-pay

## 2023-02-24 ENCOUNTER — Other Ambulatory Visit (HOSPITAL_COMMUNITY): Payer: Self-pay

## 2023-02-24 MED ORDER — PREDNISONE 5 MG PO TABS
25.0000 mg | ORAL_TABLET | Freq: Every day | ORAL | 0 refills | Status: DC
Start: 2023-02-24 — End: 2023-04-16
  Filled 2023-02-24: qty 150, 30d supply, fill #0

## 2023-03-11 ENCOUNTER — Other Ambulatory Visit (HOSPITAL_COMMUNITY): Payer: Self-pay

## 2023-03-11 ENCOUNTER — Other Ambulatory Visit: Payer: Self-pay

## 2023-04-01 ENCOUNTER — Other Ambulatory Visit (HOSPITAL_BASED_OUTPATIENT_CLINIC_OR_DEPARTMENT_OTHER): Payer: Self-pay

## 2023-04-03 ENCOUNTER — Other Ambulatory Visit (HOSPITAL_COMMUNITY): Payer: Self-pay

## 2023-04-04 ENCOUNTER — Other Ambulatory Visit (HOSPITAL_COMMUNITY): Payer: Self-pay

## 2023-04-09 DIAGNOSIS — Z8709 Personal history of other diseases of the respiratory system: Secondary | ICD-10-CM | POA: Insufficient documentation

## 2023-04-09 DIAGNOSIS — J45990 Exercise induced bronchospasm: Secondary | ICD-10-CM | POA: Insufficient documentation

## 2023-04-09 DIAGNOSIS — J309 Allergic rhinitis, unspecified: Secondary | ICD-10-CM | POA: Insufficient documentation

## 2023-04-09 DIAGNOSIS — R400 Somnolence: Secondary | ICD-10-CM | POA: Insufficient documentation

## 2023-04-09 DIAGNOSIS — G4733 Obstructive sleep apnea (adult) (pediatric): Secondary | ICD-10-CM | POA: Insufficient documentation

## 2023-04-09 DIAGNOSIS — F322 Major depressive disorder, single episode, severe without psychotic features: Secondary | ICD-10-CM | POA: Insufficient documentation

## 2023-04-09 DIAGNOSIS — K219 Gastro-esophageal reflux disease without esophagitis: Secondary | ICD-10-CM | POA: Insufficient documentation

## 2023-04-09 NOTE — Progress Notes (Addendum)
Office Visit Note  Patient: Elijah [name]             Date of Birth: [birth year]           MRN: [id number]             PCP: Tally Joe, MD Referring: Deatra Rhoderick, MD Visit Date: 04/23/2023 Occupation: @GUAROCC @  Subjective:  Muscle pain  History of Present Illness: Elijah [name] is a 68 y.o. male seen in consultation per request of Dr. Wynelle Link.  According the patient his symptoms started in June 2024 when he went to [place] to a soccer match.  He states towards the end of the day he started having pain in his shoulders and his hips.  He also started feeling extreme fatigue.  He recalls having difficulty walking.  He states his symptoms got worse over the next few days with increased pain in his shoulders.  He states he used to workout on a regular basis.  He could not lift weights anymore.  He also had difficulty doing chest presses.  None of the other joints were painful or swollen.  He states he went to see Dr. Wynelle Link and had lab work which was negative for RMSF and Lyme.  His sedimentation rate was elevated.  ANA was negative and rheumatoid factor was negative.  He was given a prednisone taper which resolved his symptoms but the symptoms soon recurred after the prednisone taper was over.  He has a total of 3 doses of prednisone taper and that he was placed on prednisone 40 mg p.o. daily with presumptive diagnosis of polymyalgia rheumatica.  Patient states he could not tolerate high-dose prednisone and had to reduce it to 35 mg.  Since then he has been reducing prednisone by 5 mg every 4 weeks.  He is currently on prednisone 22.5 mg p.o. daily.  He states he has no muscular weakness or tenderness on the current dose of prednisone.  He states on January 9 he developed severe abdominal pain and went to the emergency room early morning.  He had CT scan of the abdomen and was found to have an abdominal mass and perforation.  He was given antibiotics and had bowel resection.  He was discharged home on  Augmentin.  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  He has an appointment with the oncologist this evening.  He is currently on oral Augmentin.  He denies any history of oral ulcers, nasal ulcers, sicca symptoms, malar rash, photosensitivity, lymphadenopathy.  He states his hands stay cold and get discolored at times.  He denies any history of digital ulcers.  There is no history of inflammatory arthritis.  There is no family history of autoimmune disease.  He does not know much on the paternal side.  He was accompanied by his wife Clydie Braun today.  He has 2 healthy children.  He is retired now.  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  He enjoys music and graphics.  He walks for exercise and used to lift weights.  He also played soccer.    Activities of Daily Living:  Patient reports morning stiffness for 0 minutes.  Patient Denies nocturnal pain.  Difficulty dressing/grooming: Denies Difficulty climbing stairs: Denies Difficulty getting out of chair: Denies Difficulty using hands for taps, buttons, cutlery, and/or writing: Denies  Review of Systems  Constitutional:  Positive for fatigue.  HENT:  Negative for mouth sores, mouth dryness and nose  dryness.   Eyes:  Positive for discharge and visual disturbance. Negative for pain and dryness.  Respiratory:  Positive for shortness of breath. Negative for difficulty breathing.        On exertion   Cardiovascular:  Positive for palpitations. Negative for chest pain and swelling in legs/feet.  Gastrointestinal:  Negative for blood in stool, constipation and diarrhea.  Endocrine: Negative for increased urination.  Genitourinary:  Negative for involuntary urination.  Musculoskeletal:  Positive for muscle weakness. Negative for joint pain, gait problem, joint pain, joint swelling, myalgias, morning stiffness, muscle tenderness and myalgias.  Skin:  Negative for color change, rash, hair loss and  sensitivity to sunlight.  Allergic/Immunologic: Negative for susceptible to infections.  Neurological:  Negative for dizziness, numbness and headaches.  Hematological:  Negative for swollen glands.  Psychiatric/Behavioral:  Positive for sleep disturbance. Negative for depressed mood. The patient is not nervous/anxious.     PMFS History:  Patient Active Problem List   Diagnosis Date Noted   Small bowel perforation (HCC) 04/10/2023   Gastroesophageal reflux disease without esophagitis 04/09/2023   Allergic rhinitis due to allergen 04/09/2023   Severe major depression (HCC) 04/09/2023   Obstructive sleep apnea 04/09/2023   Daytime somnolence 04/09/2023   Exercise-induced asthma 04/09/2023    Past Medical History:  Diagnosis Date   Acid reflux    Allergic rhinitis    Asthma    Bloating    BPH (benign prostatic hyperplasia)    Depression    ED (erectile dysfunction)    Epigastric pain    Exercise-induced asthma    GERD (gastroesophageal reflux disease)    History of kidney stones    Hypercholesteremia    Migraine 2009   Polymyalgia rheumatica (HCC)    Prediabetes    Prostate disease    Sleep apnea     Family History  Problem Relation Age of Onset   Healthy Son    Healthy Daughter    Past Surgical History:  Procedure Laterality Date   ANKLE SURGERY Right    BOWEL RESECTION N/A 04/10/2023   Procedure: SMALL BOWEL RESECTION WITH MASS;  Surgeon: Moise Boring, MD;  Location: [facility] OR;  Service: General;  Laterality: N/A;   CHEST TUBE INSERTION Left    COLONOSCOPY     CYSTOSCOPY WITH RETROGRADE PYELOGRAM, URETEROSCOPY AND STENT PLACEMENT Left 06/29/2020   Procedure: CYSTOSCOPY WITH RETROGRADE PYELOGRAM, URETEROSCOPY , BASKET EXTRACTION, AND STENT PLACEMENT;  Surgeon: Sebastian Ache, MD;  Location: [facility];  Service: Urology;  Laterality: Left;  1 HR   KNEE SURGERY Right    LAPAROTOMY N/A 04/10/2023   Procedure: EXPLORATORY LAPAROTOMY;  Surgeon: Moise Boring, MD;   Location: [facility];  Service: General;  Laterality: N/A;   SKIN CANCER EXCISION     TONSILLECTOMY     VASECTOMY     WISDOM TOOTH EXTRACTION     Social History   Social History Narrative   Not on file    There is no immunization history on file for this patient.   Objective: Vital Signs: BP (!) 168/96 (BP Location: Right Arm, Patient Position: Sitting, Cuff Size: Normal)   Pulse 82   Resp 16   Ht 6\' 1"  (1.854 m)   Wt 236 lb 9.6 oz (107.3 kg)   BMI 31.22 kg/m    Physical Exam Vitals and nursing note reviewed.  Constitutional:      Appearance: He is well-developed.  HENT:     Head: Normocephalic and atraumatic.  Eyes:  Conjunctiva/sclera: Conjunctivae normal.     Pupils: Pupils are equal, round, and reactive to light.  Cardiovascular:     Rate and Rhythm: Normal rate and regular rhythm.     Heart sounds: Normal heart sounds.  Pulmonary:     Effort: Pulmonary effort is normal.     Breath sounds: Normal breath sounds.  Abdominal:     General: Bowel sounds are normal.     Palpations: Abdomen is soft.  Musculoskeletal:     Cervical back: Normal range of motion and neck supple.  Skin:    General: Skin is warm and dry.     Capillary Refill: Capillary refill takes 2 to 3 seconds.     Comments: Extensive hemangiomas were noted on his right side of chest and right arm.  Decreased capillary refill was noted.  Some tightness of the skin over the fingers distal to MCPs was noted.  No nailbed capillary changes were noted.  No digital ulcers were noted.  Neurological:     Mental Status: He is alert and oriented to person, place, and time.  Psychiatric:        Behavior: Behavior normal.      Musculoskeletal Exam: Cervical, thoracic and lumbar spine 1 good range of motion.  Shoulders, elbows, wrist joints, MCPs PIPs and DIPs with good range of motion with no synovitis.  Hip joints and knee joints with good range of motion.  There was no tenderness over ankles or MTPs.  His hands  were cold to touch.  CDAI Exam: CDAI Score: -- Patient Global: --; Provider Global: -- Swollen: --; Tender: -- Joint Exam 04/23/2023   No joint exam has been documented for this visit   There is currently no information documented on the homunculus. Go to the Rheumatology activity and complete the homunculus joint exam.  Investigation: No additional findings.  Imaging: CT ABDOMEN PELVIS W CONTRAST Result Date: 04/16/2023 CLINICAL DATA:  Postop abdominal pain, newly diagnosed lymphoma EXAM: CT ABDOMEN AND PELVIS WITH CONTRAST TECHNIQUE: Multidetector CT imaging of the abdomen and pelvis was performed using the standard protocol following bolus administration of intravenous contrast. RADIATION DOSE REDUCTION: This exam was performed according to the departmental dose-optimization program which includes automated exposure control, adjustment of the mA and/or kV according to patient size and/or use of iterative reconstruction technique. CONTRAST:  75mL OMNIPAQUE IOHEXOL 350 MG/ML SOLN COMPARISON:  04/10/2023 FINDINGS: Lower chest: Mild bibasilar atelectasis. Hepatobiliary: Liver is within normal limits. Gallbladder is unremarkable. No intrahepatic or extrahepatic ductal dilatation. Pancreas: Within normal limits Spleen: Within normal limits Adrenals/Urinary Tract: Adrenal glands are within normal limits. Bilateral simple renal cysts, measuring up to 3.6 cm in the right lower kidney (series 3/image 49), benign (Bosniak I). 3 mm nonobstructing left lower pole renal calculus (series 3/image 41). No hydronephrosis. Bladder is mildly thick-walled although underdistended. Stomach/Bowel: Stomach is within normal limits. No evidence of bowel obstruction. Suspected subtotal small bowel/mesenteric mass resection, with residual abnormal soft tissue in the left mid abdomen (series 3/images 53, 59, and 69). Central necrosis with mesenteric fluid/gas (series 3/image 60). Adjacent left mid abdominal drain (series  3/image 83) with terminates in the right lower abdomen (series 3/image 68). Suspected appendix is normal (series 3/image 84). No colonic wall thickening or inflammatory changes. Vascular/Lymphatic: No evidence of abdominal aortic aneurysm. Atherosclerotic calcifications of the abdominal aorta and branch vessels, although vessels remain patent. No suspicious abdominopelvic lymphadenopathy. Reproductive: Prostatomegaly, suggesting BPH. Other: Additional small mesenteric fluid collection beneath the anterior abdominal wall measuring approximately  2.7 x 5.4 cm (series 3/image 55), postsurgical. No free air. Musculoskeletal: Postsurgical changes along the midline anterior abdominal wall. Degenerative changes of the visualized thoracolumbar spine. IMPRESSION: Suspected subtotal small bowel/mesenteric mass resection, with residual abnormal soft tissue in the left mid abdomen, as above. This likely corresponds to the patient's known T-cell lymphoma. Associated central necrosis with mesenteric fluid/gas. Adjacent left mid abdominal drain with terminates in the right lower abdomen. Additional small mesenteric fluid collection beneath the anterior abdominal wall, postsurgical. Additional ancillary findings as above. Electronically Signed   By: Charline Bills M.D.   On: 04/16/2023 20:48   DG Abd Portable 1 View Result Date: 04/10/2023 CLINICAL DATA:  68 year old male NG tube placement. EXAM: PORTABLE ABDOMEN - 1 VIEW COMPARISON:  CT Abdomen and Pelvis 0852 hours today. FINDINGS: Supine AP view at 1053 hours. Enteric tube tip has been placed into the stomach. The side hole is not apparent. Lung bases and bowel gas pattern appears stable, pneumoperitoneum more evident by CT. IMPRESSION: Enteric tube tip placed into the proximal stomach. Side hole not identified, consider advancement of several cm to ensure side hole placement within the stomach. Electronically Signed   By: Odessa Fleming M.D.   On: 04/10/2023 11:08   CT ABDOMEN  PELVIS W CONTRAST Result Date: 04/10/2023 CLINICAL DATA:  Left lower quadrant abdominal pain.  Dry heaves. EXAM: CT ABDOMEN AND PELVIS WITH CONTRAST TECHNIQUE: Multidetector CT imaging of the abdomen and pelvis was performed using the standard protocol following bolus administration of intravenous contrast. RADIATION DOSE REDUCTION: This exam was performed according to the departmental dose-optimization program which includes automated exposure control, adjustment of the mA and/or kV according to patient size and/or use of iterative reconstruction technique. CONTRAST:  OMNIPAQUE IOHEXOL 300 MG/ML  SOLN COMPARISON:  12/07/2019 FINDINGS: Lower chest: Dependent atelectasis. Hepatobiliary: No suspicious focal abnormality within the liver parenchyma. There is no evidence for gallstones, gallbladder wall thickening, or pericholecystic fluid. No intrahepatic or extrahepatic biliary dilation. Pancreas: No focal mass lesion. No dilatation of the main duct. No intraparenchymal cyst. No peripancreatic edema. Spleen: No splenomegaly. No suspicious focal mass lesion. Adrenals/Urinary Tract: No adrenal nodule or mass. Small bilateral renal cysts noted. No followup imaging is recommended. 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 No followup imaging is recommended. No evidence for hydroureter. Mild circumferential bladder wall thickening likely accentuated by underdistention. Stomach/Bowel: Stomach is unremarkable. No gastric wall thickening. No evidence of outlet obstruction. Duodenum is normally positioned as is the ligament of Treitz. 13.9 x 9.9 x 12.4 cm mass lesion is identified in the left abdomen. This lesion contains central fluid, debris and gas and is contiguous with the lumen of the small bowel. The terminal ileum is normal. The appendix is normal. Colon is nondilated. There is some mild wall thickening in the descending colon is a tracks along the small bowel mass, likely secondary.  Vascular/Lymphatic: There is mild atherosclerotic calcification of the abdominal aorta without aneurysm. There is no gastrohepatic or hepatoduodenal ligament lymphadenopathy. No retroperitoneal or mesenteric lymphadenopathy. No pelvic sidewall lymphadenopathy. Reproductive: Prostate gland is mildly enlarged. Other: Small volume ascites. Moderate volume intraperitoneal free gas. Musculoskeletal: No worrisome lytic or sclerotic osseous abnormality. IMPRESSION: 1. 13.9 x 9.9 x 12.4 cm mass lesion in the left abdomen containing central fluid, debris and gas and is contiguous with the lumen of the small bowel. This feature along with the large size of the mass and no associated small bowel obstruction is suggestive of small-bowel  lymphoma as the etiology. The presence of associated intraperitoneal free gas is consistent with perforation of the lesion/involved small bowel segment. 2. Small volume ascites. 3. Mild wall thickening in the descending colon is a tracks along the small bowel mass, likely secondary. 4.  Aortic Atherosclerosis (ICD10-I70.0). Critical Value/emergent results were called by telephone at the time of interpretation on 04/10/2023 at 9:38 am to provider Dr. Theresia Lo, who verbally acknowledged these results. Electronically Signed   By: Kennith Center M.D.   On: 04/10/2023 09:38    Recent Labs: Lab Results  Component Value Date   WBC 13.4 (H) 04/15/2023   HGB 10.6 (L) 04/15/2023   PLT 279 04/15/2023   NA 141 04/15/2023   K 3.7 04/15/2023   CL 106 04/15/2023   CO2 27 04/15/2023   GLUCOSE 172 (H) 04/15/2023   BUN 13 04/15/2023   CREATININE 1.27 (H) 04/17/2023   BILITOT 0.4 04/10/2023   ALKPHOS 61 04/10/2023   AST 18 04/10/2023   ALT 23 04/10/2023   PROT 6.9 04/10/2023   ALBUMIN 4.0 04/10/2023   CALCIUM 8.7 (L) 04/15/2023   GFRAA  06/17/2007    >60        The eGFR has been calculated using the MDRD equation. This calculation has not been validated in all clinical   October 29, 2022  creatinine 1.17, TSH normal, RF negative, RMSF negative, ANA negative, ESR 83, CPK 55   April 10, 2023 FINAL MICROSCOPIC DIAGNOSIS:   A. 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   Read by Dr. Melody Haver   Speciality Comments: No specialty comments available.  Procedures:  No procedures performed Allergies: Singulair [montelukast], Amiodarone, and Crestor [rosuvastatin]   Assessment / Plan:     Visit Diagnoses: Chronic pain of both shoulders -he started having pain and discomfort in his both shoulders in June 2024.  The pain was sudden onset and progressively got worse.  He had good response to prednisone taper.  He has had 3 prednisone tapers and then he has been on prednisone 40 mg p.o. daily with gradual taper.  He is currently on prednisone 22.5 mg p.o. daily.  Plan: Cyclic citrul peptide antibody, IgG  Chronic pain of both hips-he also had discomfort in his hips but not significant problem getting up from the chair.  He denies history of pain or inflammation in any other joints.  Raynaud's syndrome without gangrene -he states his hands and feet always just take hold.  He states the symptoms have been there for many years.  He had decreased capillary refill.  Some erythema was also noted over the knuckles.  But no typical Gottron's papules.  No digital ulcers were noted.  I will obtain additional labs today.  Plan: Pan-ANCA, Cryoglobulin, RNP Antibody, Anti-Smith antibody, Anti-DNA antibody, double-stranded, C3 and C4, Beta-2 glycoprotein antibodies, Cardiolipin antibodies, IgG, IgM, IgA, Sjogrens syndrome-A extractable nuclear antibody, Sjogrens syndrome-B extractable nuclear antibody, Other/Misc lab test  Elevated sed rate-patient was found to have elevated sedimentation rate in July.  Will repeat sed rate today.  Polymyalgia rheumatica-patient was given the diagnosis of  presumptive polymyalgia rheumatica based on muscle pain and elevated sedimentation rate.  He responded to prednisone.  He is currently on prednisone 22.5 mg.  I discussed tapering prednisone by 2.5 mg every 2 weeks until he reaches 10 mg p.o. daily.  Then we can taper by 1 mg every month.  I am  hesitant to give him any steroid sparing agents until he has workup for B-cell lymphoma.  Plan: Sedimentation rate, CK, TSH, Myositis Specific II Antibodies Panel, 3-Hydroxy-3-Methylglutaryl-Coenzyme A Reductase (HMGCR) AB (IgG), Urinalysis, Routine w reflex microscopic.  I also advised patient to schedule an appointment with Dr. Amy Swaziland for a skin biopsy to rule out a myopathic dermatomyositis.  Other fatigue-he has been experiencing fatigue.  Long-term current use of systemic steroids-he has been off and on prednisone since last year.  Long-term side effects of prednisone including weight gain, diabetes, hypertension, heart disease, cataracts and osteoporosis were discussed.  He will be taking prednisone.  Hemoglobin A1c is monitored by his PCP.  High risk medication use - Plan: Serum protein electrophoresis with reflex, IgG, IgA, IgM, Hepatitis B core antibody, IgM, Hepatitis B surface antigen, Hepatitis C antibody  Small bowel perforation (HCC) - Klebsiella pneumoniae peritonitis.  Status post abdominal mass resection, path report consistent with a small cell lymphoma.  He is currently on Augmentin.   Other medical problems are listed as follows:  Gastroesophageal reflux disease without esophagitis  Pure hypercholesterolemia  Elevated hemoglobin A1c  Exercise-induced asthma  Seasonal allergic rhinitis due to other allergic trigger  Benign prostatic hyperplasia without lower urinary tract symptoms - Followed by Dr. Berneice Heinrich  Obstructive sleep apnea - On CPAP  Severe major depression (HCC)  Daytime somnolence  Kidney stones  Orders: Orders Placed This Encounter  Procedures   Sedimentation  rate   CK   TSH   Cyclic citrul peptide antibody, IgG   Myositis Specific II Antibodies Panel   3-Hydroxy-3-Methylglutaryl-Coenzyme A Reductase (HMGCR) AB (IgG)   Pan-ANCA   Cryoglobulin   Urinalysis, Routine w reflex microscopic   Serum protein electrophoresis with reflex   IgG, IgA, IgM   Hepatitis B core antibody, IgM   Hepatitis B surface antigen   Hepatitis C antibody   RNP Antibody   Anti-Smith antibody   Anti-DNA antibody, double-stranded   C3 and C4   Beta-2 glycoprotein antibodies   Cardiolipin antibodies, IgG, IgM, IgA   Sjogrens syndrome-A extractable nuclear antibody   Sjogrens syndrome-B extractable nuclear antibody   Other/Misc lab test   No orders of the defined types were placed in this encounter.   Face-to-face time spent with patient was 60 minutes. Greater than 50% of time was spent in counseling and coordination of care.  Follow-Up Instructions: Return for Myalgia, Raynauds, rash.   Pollyann Savoy, MD  Note - This record has been created using Animal nutritionist.  Chart creation errors have been sought, but may not always  have been located. Such creation errors do not reflect on  the standard of medical care.

## 2023-04-10 ENCOUNTER — Encounter (HOSPITAL_COMMUNITY): Admission: EM | Disposition: A | Discharge status: Home Health | Payer: Self-pay | Source: Home / Self Care

## 2023-04-10 ENCOUNTER — Emergency Department (HOSPITAL_BASED_OUTPATIENT_CLINIC_OR_DEPARTMENT_OTHER): Payer: Medicare Other

## 2023-04-10 ENCOUNTER — Other Ambulatory Visit: Payer: Self-pay

## 2023-04-10 ENCOUNTER — Inpatient Hospital Stay (HOSPITAL_BASED_OUTPATIENT_CLINIC_OR_DEPARTMENT_OTHER)
Admission: EM | Admit: 2023-04-10 | Discharge: 2023-04-17 | DRG: 820 | Disposition: A | Discharge status: Home Health | Payer: Medicare Other | Attending: General Surgery | Admitting: General Surgery

## 2023-04-10 ENCOUNTER — Emergency Department (HOSPITAL_COMMUNITY): Payer: Medicare Other | Admitting: Certified Registered Nurse Anesthetist

## 2023-04-10 ENCOUNTER — Encounter (HOSPITAL_BASED_OUTPATIENT_CLINIC_OR_DEPARTMENT_OTHER): Payer: Self-pay

## 2023-04-10 DIAGNOSIS — K650 Generalized (acute) peritonitis: Secondary | ICD-10-CM | POA: Diagnosis present

## 2023-04-10 DIAGNOSIS — K631 Perforation of intestine (nontraumatic): Secondary | ICD-10-CM | POA: Diagnosis present

## 2023-04-10 DIAGNOSIS — Z7952 Long term (current) use of systemic steroids: Secondary | ICD-10-CM | POA: Diagnosis not present

## 2023-04-10 DIAGNOSIS — Z6832 Body mass index (BMI) 32.0-32.9, adult: Secondary | ICD-10-CM

## 2023-04-10 DIAGNOSIS — J45909 Unspecified asthma, uncomplicated: Secondary | ICD-10-CM | POA: Diagnosis present

## 2023-04-10 DIAGNOSIS — E7800 Pure hypercholesterolemia, unspecified: Secondary | ICD-10-CM | POA: Diagnosis present

## 2023-04-10 DIAGNOSIS — C8589 Other specified types of non-Hodgkin lymphoma, extranodal and solid organ sites: Principal | ICD-10-CM | POA: Diagnosis present

## 2023-04-10 DIAGNOSIS — Z79899 Other long term (current) drug therapy: Secondary | ICD-10-CM | POA: Diagnosis not present

## 2023-04-10 DIAGNOSIS — E876 Hypokalemia: Secondary | ICD-10-CM | POA: Diagnosis present

## 2023-04-10 DIAGNOSIS — E1165 Type 2 diabetes mellitus with hyperglycemia: Secondary | ICD-10-CM | POA: Diagnosis present

## 2023-04-10 DIAGNOSIS — D6489 Other specified anemias: Secondary | ICD-10-CM | POA: Diagnosis present

## 2023-04-10 DIAGNOSIS — E66811 Obesity, class 1: Secondary | ICD-10-CM | POA: Diagnosis present

## 2023-04-10 DIAGNOSIS — N1831 Chronic kidney disease, stage 3a: Secondary | ICD-10-CM | POA: Diagnosis present

## 2023-04-10 DIAGNOSIS — G4733 Obstructive sleep apnea (adult) (pediatric): Secondary | ICD-10-CM

## 2023-04-10 DIAGNOSIS — E870 Hyperosmolality and hypernatremia: Secondary | ICD-10-CM | POA: Diagnosis present

## 2023-04-10 DIAGNOSIS — Z888 Allergy status to other drugs, medicaments and biological substances status: Secondary | ICD-10-CM | POA: Diagnosis not present

## 2023-04-10 DIAGNOSIS — K219 Gastro-esophageal reflux disease without esophagitis: Secondary | ICD-10-CM | POA: Diagnosis present

## 2023-04-10 DIAGNOSIS — I129 Hypertensive chronic kidney disease with stage 1 through stage 4 chronic kidney disease, or unspecified chronic kidney disease: Secondary | ICD-10-CM | POA: Diagnosis present

## 2023-04-10 DIAGNOSIS — R0902 Hypoxemia: Secondary | ICD-10-CM | POA: Diagnosis present

## 2023-04-10 DIAGNOSIS — M353 Polymyalgia rheumatica: Secondary | ICD-10-CM | POA: Diagnosis present

## 2023-04-10 DIAGNOSIS — B961 Klebsiella pneumoniae [K. pneumoniae] as the cause of diseases classified elsewhere: Secondary | ICD-10-CM | POA: Diagnosis present

## 2023-04-10 DIAGNOSIS — N179 Acute kidney failure, unspecified: Secondary | ICD-10-CM | POA: Diagnosis present

## 2023-04-10 DIAGNOSIS — D1801 Hemangioma of skin and subcutaneous tissue: Secondary | ICD-10-CM | POA: Diagnosis present

## 2023-04-10 DIAGNOSIS — R1032 Left lower quadrant pain: Principal | ICD-10-CM

## 2023-04-10 DIAGNOSIS — Z85828 Personal history of other malignant neoplasm of skin: Secondary | ICD-10-CM

## 2023-04-10 DIAGNOSIS — N400 Enlarged prostate without lower urinary tract symptoms: Secondary | ICD-10-CM | POA: Diagnosis present

## 2023-04-10 DIAGNOSIS — K567 Ileus, unspecified: Secondary | ICD-10-CM | POA: Diagnosis not present

## 2023-04-10 DIAGNOSIS — K6389 Other specified diseases of intestine: Secondary | ICD-10-CM

## 2023-04-10 DIAGNOSIS — F32A Depression, unspecified: Secondary | ICD-10-CM | POA: Diagnosis not present

## 2023-04-10 HISTORY — DX: Polymyalgia rheumatica: M35.3

## 2023-04-10 HISTORY — PX: BOWEL RESECTION: SHX1257

## 2023-04-10 HISTORY — PX: LAPAROTOMY: SHX154

## 2023-04-10 LAB — URINALYSIS, ROUTINE W REFLEX MICROSCOPIC
Bilirubin Urine: NEGATIVE
Glucose, UA: NEGATIVE mg/dL
Hgb urine dipstick: NEGATIVE
Ketones, ur: NEGATIVE mg/dL
Leukocytes,Ua: NEGATIVE
Nitrite: NEGATIVE
Protein, ur: NEGATIVE mg/dL
Specific Gravity, Urine: 1.022 (ref 1.005–1.030)
pH: 5 (ref 5.0–8.0)

## 2023-04-10 LAB — COMPREHENSIVE METABOLIC PANEL
ALT: 23 U/L (ref 0–44)
AST: 18 U/L (ref 15–41)
Albumin: 4 g/dL (ref 3.5–5.0)
Alkaline Phosphatase: 61 U/L (ref 38–126)
Anion gap: 15 (ref 5–15)
BUN: 28 mg/dL — ABNORMAL HIGH (ref 8–23)
CO2: 24 mmol/L (ref 22–32)
Calcium: 9.6 mg/dL (ref 8.9–10.3)
Chloride: 102 mmol/L (ref 98–111)
Creatinine, Ser: 1.58 mg/dL — ABNORMAL HIGH (ref 0.61–1.24)
GFR, Estimated: 48 mL/min — ABNORMAL LOW (ref >60)
Glucose, Bld: 200 mg/dL — ABNORMAL HIGH (ref 70–99)
Potassium: 3.8 mmol/L (ref 3.5–5.1)
Sodium: 141 mmol/L (ref 135–145)
Total Bilirubin: 0.4 mg/dL (ref 0.0–1.2)
Total Protein: 6.9 g/dL (ref 6.5–8.1)

## 2023-04-10 LAB — CBC
HCT: 43 % (ref 39.0–52.0)
Hemoglobin: 14.1 g/dL (ref 13.0–17.0)
MCH: 28.5 pg (ref 26.0–34.0)
MCHC: 32.8 g/dL (ref 30.0–36.0)
MCV: 86.9 fL (ref 80.0–100.0)
Platelets: 296 10*3/uL (ref 150–400)
RBC: 4.95 MIL/uL (ref 4.22–5.81)
RDW: 15.2 % (ref 11.5–15.5)
WBC: 14.3 10*3/uL — ABNORMAL HIGH (ref 4.0–10.5)
nRBC: 0 % (ref 0.0–0.2)

## 2023-04-10 LAB — CBC WITH DIFFERENTIAL/PLATELET
Abs Immature Granulocytes: 0.1 10*3/uL — ABNORMAL HIGH (ref 0.00–0.07)
Basophils Absolute: 0.1 10*3/uL (ref 0.0–0.1)
Basophils Relative: 0 %
Eosinophils Absolute: 0.1 10*3/uL (ref 0.0–0.5)
Eosinophils Relative: 1 %
HCT: 42.1 % (ref 39.0–52.0)
Hemoglobin: 13.5 g/dL (ref 13.0–17.0)
Immature Granulocytes: 1 %
Lymphocytes Relative: 16 %
Lymphs Abs: 2.5 10*3/uL (ref 0.7–4.0)
MCH: 28.1 pg (ref 26.0–34.0)
MCHC: 32.1 g/dL (ref 30.0–36.0)
MCV: 87.5 fL (ref 80.0–100.0)
Monocytes Absolute: 0.8 10*3/uL (ref 0.1–1.0)
Monocytes Relative: 5 %
Neutro Abs: 11.5 10*3/uL — ABNORMAL HIGH (ref 1.7–7.7)
Neutrophils Relative %: 77 %
Platelets: 379 10*3/uL (ref 150–400)
RBC: 4.81 MIL/uL (ref 4.22–5.81)
RDW: 15.1 % (ref 11.5–15.5)
WBC: 15.1 10*3/uL — ABNORMAL HIGH (ref 4.0–10.5)
nRBC: 0 % (ref 0.0–0.2)

## 2023-04-10 LAB — CREATININE, SERUM
Creatinine, Ser: 1.62 mg/dL — ABNORMAL HIGH (ref 0.61–1.24)
GFR, Estimated: 47 mL/min — ABNORMAL LOW (ref >60)

## 2023-04-10 LAB — ABO/RH: ABO/RH(D): O POS

## 2023-04-10 LAB — GLUCOSE, CAPILLARY
Glucose-Capillary: 204 mg/dL — ABNORMAL HIGH (ref 70–99)
Glucose-Capillary: 211 mg/dL — ABNORMAL HIGH (ref 70–99)

## 2023-04-10 LAB — TYPE AND SCREEN
ABO/RH(D): O POS
Antibody Screen: NEGATIVE

## 2023-04-10 LAB — HEMOGLOBIN A1C
Hgb A1c MFr Bld: 7.5 % — ABNORMAL HIGH (ref 4.8–5.6)
Mean Plasma Glucose: 168.55 mg/dL

## 2023-04-10 LAB — LIPASE, BLOOD: Lipase: 11 U/L (ref 11–51)

## 2023-04-10 SURGERY — LAPAROTOMY, EXPLORATORY
Anesthesia: General | Site: Abdomen

## 2023-04-10 MED ORDER — PROPOFOL 10 MG/ML IV BOLUS
INTRAVENOUS | Status: DC | PRN
  Administered 2023-04-10: 100 mg via INTRAVENOUS
  Administered 2023-04-10: 50 mg via INTRAVENOUS

## 2023-04-10 MED ORDER — ONDANSETRON HCL 4 MG/2ML IJ SOLN
4.0000 mg | Freq: Once | INTRAMUSCULAR | Status: AC
  Administered 2023-04-10: 4 mg via INTRAVENOUS
  Filled 2023-04-10: qty 2

## 2023-04-10 MED ORDER — ESMOLOL HCL 100 MG/10ML IV SOLN
INTRAVENOUS | Status: DC | PRN
  Administered 2023-04-10 (×2): 10 mg via INTRAVENOUS

## 2023-04-10 MED ORDER — FENTANYL CITRATE (PF) 250 MCG/5ML IJ SOLN
INTRAMUSCULAR | Status: AC
  Filled 2023-04-10: qty 5

## 2023-04-10 MED ORDER — HYDROMORPHONE 1 MG/ML IV SOLN
INTRAVENOUS | Status: DC
Start: 2023-04-10 — End: 2023-04-14
  Administered 2023-04-10: 30 mg via INTRAVENOUS
  Administered 2023-04-13: 1.2 mg via INTRAVENOUS

## 2023-04-10 MED ORDER — ORAL CARE MOUTH RINSE: 15.0000 mL | Freq: Once | OROMUCOSAL | Status: AC

## 2023-04-10 MED ORDER — HYDROMORPHONE HCL 1 MG/ML IJ SOLN
0.5000 mg | Freq: Once | INTRAMUSCULAR | Status: AC
  Administered 2023-04-10: 0.5 mg via INTRAVENOUS
  Filled 2023-04-10: qty 1

## 2023-04-10 MED ORDER — IOHEXOL 300 MG/ML  SOLN
100.0000 mL | Freq: Once | INTRAMUSCULAR | Status: AC | PRN
  Administered 2023-04-10: 100 mL via INTRAVENOUS

## 2023-04-10 MED ORDER — ACETAMINOPHEN 10 MG/ML IV SOLN
1000.0000 mg | Freq: Once | INTRAVENOUS | Status: AC
  Administered 2023-04-10: 1000 mg via INTRAVENOUS

## 2023-04-10 MED ORDER — MORPHINE SULFATE (PF) 4 MG/ML IV SOLN
4.0000 mg | Freq: Once | INTRAVENOUS | Status: AC
  Administered 2023-04-10: 4 mg via INTRAVENOUS
  Filled 2023-04-10: qty 1

## 2023-04-10 MED ORDER — FENTANYL CITRATE (PF) 100 MCG/2ML IJ SOLN
25.0000 ug | INTRAMUSCULAR | Status: DC | PRN
  Administered 2023-04-10 (×3): 50 ug via INTRAVENOUS

## 2023-04-10 MED ORDER — KETOROLAC TROMETHAMINE 30 MG/ML IJ SOLN
INTRAMUSCULAR | Status: AC
  Filled 2023-04-10: qty 1

## 2023-04-10 MED ORDER — INSULIN ASPART 100 UNIT/ML IJ SOLN
0.0000 [IU] | INTRAMUSCULAR | Status: DC
  Administered 2023-04-10: 3 [IU] via SUBCUTANEOUS
  Administered 2023-04-11 (×3): 2 [IU] via SUBCUTANEOUS
  Administered 2023-04-11: 1 [IU] via SUBCUTANEOUS
  Administered 2023-04-11 – 2023-04-12 (×4): 2 [IU] via SUBCUTANEOUS
  Administered 2023-04-12 – 2023-04-13 (×3): 1 [IU] via SUBCUTANEOUS
  Administered 2023-04-13: 2 [IU] via SUBCUTANEOUS
  Administered 2023-04-13 – 2023-04-14 (×8): 1 [IU] via SUBCUTANEOUS
  Administered 2023-04-14 – 2023-04-15 (×3): 2 [IU] via SUBCUTANEOUS
  Administered 2023-04-15: 3 [IU] via SUBCUTANEOUS
  Administered 2023-04-15: 2 [IU] via SUBCUTANEOUS

## 2023-04-10 MED ORDER — SODIUM CHLORIDE 0.9 % IV BOLUS
1000.0000 mL | Freq: Once | INTRAVENOUS | Status: AC
  Administered 2023-04-10: 1000 mL via INTRAVENOUS

## 2023-04-10 MED ORDER — PANTOPRAZOLE SODIUM 40 MG IV SOLR: 40.0000 mg | Freq: Every day | INTRAVENOUS | Status: DC

## 2023-04-10 MED ORDER — PANTOPRAZOLE SODIUM 40 MG IV SOLR
40.0000 mg | INTRAVENOUS | Status: DC
  Administered 2023-04-10 – 2023-04-14 (×5): 40 mg via INTRAVENOUS
  Filled 2023-04-10 (×5): qty 10

## 2023-04-10 MED ORDER — PIPERACILLIN-TAZOBACTAM 3.375 G IVPB 30 MIN
3.3750 g | Freq: Once | INTRAVENOUS | Status: AC
  Administered 2023-04-10: 3.375 g via INTRAVENOUS
  Filled 2023-04-10: qty 50

## 2023-04-10 MED ORDER — HYDROMORPHONE HCL 1 MG/ML IJ SOLN: 1.0000 mg | INTRAMUSCULAR | Status: DC | PRN

## 2023-04-10 MED ORDER — KETOROLAC TROMETHAMINE 30 MG/ML IJ SOLN
15.0000 mg | Freq: Four times a day (QID) | INTRAMUSCULAR | Status: DC | PRN
  Administered 2023-04-10: 15 mg via INTRAVENOUS

## 2023-04-10 MED ORDER — SUGAMMADEX SODIUM 200 MG/2ML IV SOLN
INTRAVENOUS | Status: DC | PRN
  Administered 2023-04-10 (×2): 100 mg via INTRAVENOUS

## 2023-04-10 MED ORDER — SUCCINYLCHOLINE CHLORIDE 200 MG/10ML IV SOSY
PREFILLED_SYRINGE | INTRAVENOUS | Status: DC | PRN
  Administered 2023-04-10: 100 mg via INTRAVENOUS

## 2023-04-10 MED ORDER — LACTATED RINGERS IV SOLN: INTRAVENOUS | Status: DC

## 2023-04-10 MED ORDER — LIDOCAINE 2% (20 MG/ML) 5 ML SYRINGE
INTRAMUSCULAR | Status: DC | PRN
  Administered 2023-04-10: 60 mg via INTRAVENOUS

## 2023-04-10 MED ORDER — 0.9 % SODIUM CHLORIDE (POUR BTL) OPTIME
TOPICAL | Status: DC | PRN
  Administered 2023-04-10 (×3): 1000 mL

## 2023-04-10 MED ORDER — HYDROCORTISONE SOD SUC (PF) 100 MG IJ SOLR
100.0000 mg | Freq: Three times a day (TID) | INTRAMUSCULAR | Status: DC
  Administered 2023-04-10 – 2023-04-12 (×5): 100 mg via INTRAVENOUS
  Filled 2023-04-10 (×6): qty 2

## 2023-04-10 MED ORDER — ONDANSETRON HCL 4 MG/2ML IJ SOLN
INTRAMUSCULAR | Status: DC | PRN
  Administered 2023-04-10: 4 mg via INTRAVENOUS

## 2023-04-10 MED ORDER — OXYCODONE HCL 5 MG PO TABS: 5.0000 mg | ORAL_TABLET | Freq: Once | ORAL | Status: DC | PRN

## 2023-04-10 MED ORDER — ACETAMINOPHEN 10 MG/ML IV SOLN
INTRAVENOUS | Status: AC
  Filled 2023-04-10: qty 100

## 2023-04-10 MED ORDER — FENTANYL CITRATE (PF) 100 MCG/2ML IJ SOLN
INTRAMUSCULAR | Status: AC
  Filled 2023-04-10: qty 2

## 2023-04-10 MED ORDER — ONDANSETRON HCL 4 MG/2ML IJ SOLN: 4.0000 mg | Freq: Four times a day (QID) | INTRAMUSCULAR | Status: DC | PRN

## 2023-04-10 MED ORDER — HYDROMORPHONE HCL 1 MG/ML IJ SOLN
0.5000 mg | Freq: Once | INTRAMUSCULAR | Status: AC
Start: 2023-04-10 — End: 2023-04-10
  Administered 2023-04-10: 0.5 mg via INTRAVENOUS
  Filled 2023-04-10: qty 1

## 2023-04-10 MED ORDER — MIDAZOLAM HCL 2 MG/2ML IJ SOLN
INTRAMUSCULAR | Status: AC
  Filled 2023-04-10: qty 2

## 2023-04-10 MED ORDER — ENOXAPARIN SODIUM 40 MG/0.4ML IJ SOSY
40.0000 mg | PREFILLED_SYRINGE | INTRAMUSCULAR | Status: DC
  Administered 2023-04-11 – 2023-04-17 (×7): 40 mg via SUBCUTANEOUS
  Filled 2023-04-10 (×7): qty 0.4

## 2023-04-10 MED ORDER — OXYCODONE HCL 5 MG/5ML PO SOLN: 5.0000 mg | Freq: Once | ORAL | Status: DC | PRN

## 2023-04-10 MED ORDER — ROCURONIUM BROMIDE 10 MG/ML (PF) SYRINGE
PREFILLED_SYRINGE | INTRAVENOUS | Status: DC | PRN
  Administered 2023-04-10: 20 mg via INTRAVENOUS
  Administered 2023-04-10: 60 mg via INTRAVENOUS

## 2023-04-10 MED ORDER — SODIUM CHLORIDE 0.9% FLUSH: 9.0000 mL | INTRAVENOUS | Status: DC | PRN

## 2023-04-10 MED ORDER — MIDAZOLAM HCL 2 MG/2ML IJ SOLN
INTRAMUSCULAR | Status: DC | PRN
  Administered 2023-04-10: 2 mg via INTRAVENOUS

## 2023-04-10 MED ORDER — PIPERACILLIN-TAZOBACTAM 3.375 G IVPB
3.3750 g | Freq: Three times a day (TID) | INTRAVENOUS | Status: AC
  Administered 2023-04-10 – 2023-04-14 (×13): 3.375 g via INTRAVENOUS
  Filled 2023-04-10 (×13): qty 50

## 2023-04-10 MED ORDER — KETOROLAC TROMETHAMINE 15 MG/ML IJ SOLN
INTRAMUSCULAR | Status: AC
  Filled 2023-04-10: qty 1

## 2023-04-10 MED ORDER — METHOCARBAMOL 1000 MG/10ML IJ SOLN
500.0000 mg | Freq: Three times a day (TID) | INTRAMUSCULAR | Status: DC
  Administered 2023-04-10 – 2023-04-14 (×11): 500 mg via INTRAVENOUS
  Filled 2023-04-10 (×11): qty 10

## 2023-04-10 MED ORDER — NALOXONE HCL 0.4 MG/ML IJ SOLN: 0.4000 mg | INTRAMUSCULAR | Status: DC | PRN

## 2023-04-10 MED ORDER — PROPOFOL 10 MG/ML IV BOLUS
INTRAVENOUS | Status: AC
Start: 2023-04-10 — End: ?
  Filled 2023-04-10: qty 20

## 2023-04-10 MED ORDER — ONDANSETRON HCL 4 MG/2ML IJ SOLN
4.0000 mg | Freq: Four times a day (QID) | INTRAMUSCULAR | Status: DC | PRN
  Administered 2023-04-10: 4 mg via INTRAVENOUS
  Filled 2023-04-10: qty 2

## 2023-04-10 MED ORDER — DIPHENHYDRAMINE HCL 12.5 MG/5ML PO ELIX: 12.5000 mg | ORAL_SOLUTION | Freq: Four times a day (QID) | ORAL | Status: DC | PRN

## 2023-04-10 MED ORDER — HYDROCORTISONE SOD SUC (PF) 100 MG IJ SOLR
100.0000 mg | Freq: Once | INTRAMUSCULAR | Status: AC
  Administered 2023-04-10: 100 mg via INTRAVENOUS
  Filled 2023-04-10: qty 2

## 2023-04-10 MED ORDER — DIPHENHYDRAMINE HCL 50 MG/ML IJ SOLN: 12.5000 mg | Freq: Four times a day (QID) | INTRAMUSCULAR | Status: DC | PRN

## 2023-04-10 MED ORDER — ONDANSETRON 4 MG PO TBDP: 4.0000 mg | ORAL_TABLET | Freq: Four times a day (QID) | ORAL | Status: DC | PRN

## 2023-04-10 MED ORDER — ALBUTEROL SULFATE (2.5 MG/3ML) 0.083% IN NEBU: 3.0000 mL | INHALATION_SOLUTION | Freq: Four times a day (QID) | RESPIRATORY_TRACT | Status: DC | PRN

## 2023-04-10 MED ORDER — ONDANSETRON HCL 4 MG/2ML IJ SOLN
4.0000 mg | Freq: Four times a day (QID) | INTRAMUSCULAR | Status: DC | PRN
  Administered 2023-04-16: 4 mg via INTRAVENOUS
  Filled 2023-04-10: qty 2

## 2023-04-10 MED ORDER — METOPROLOL TARTRATE 5 MG/5ML IV SOLN: 5.0000 mg | Freq: Four times a day (QID) | INTRAVENOUS | Status: DC | PRN

## 2023-04-10 MED ORDER — LACTATED RINGERS IV SOLN
INTRAVENOUS | Status: DC | PRN
Start: 2023-04-10 — End: 2023-04-10

## 2023-04-10 MED ORDER — ACETAMINOPHEN 10 MG/ML IV SOLN
1000.0000 mg | Freq: Four times a day (QID) | INTRAVENOUS | Status: AC
  Administered 2023-04-11 (×4): 1000 mg via INTRAVENOUS
  Filled 2023-04-10 (×4): qty 100

## 2023-04-10 MED ORDER — ALBUMIN HUMAN 5 % IV SOLN: INTRAVENOUS | Status: DC | PRN

## 2023-04-10 MED ORDER — HYDROMORPHONE 1 MG/ML IV SOLN
INTRAVENOUS | Status: AC
  Filled 2023-04-10: qty 30

## 2023-04-10 MED ORDER — FENTANYL CITRATE (PF) 250 MCG/5ML IJ SOLN
INTRAMUSCULAR | Status: DC | PRN
  Administered 2023-04-10 (×2): 50 ug via INTRAVENOUS
  Administered 2023-04-10: 150 ug via INTRAVENOUS
  Administered 2023-04-10: 50 ug via INTRAVENOUS

## 2023-04-10 MED ORDER — CHLORHEXIDINE GLUCONATE 0.12 % MT SOLN
15.0000 mL | Freq: Once | OROMUCOSAL | Status: AC
  Administered 2023-04-10: 15 mL via OROMUCOSAL
  Filled 2023-04-10: qty 15

## 2023-04-10 SURGICAL SUPPLY — 48 items
BLADE CLIPPER SURG (BLADE) IMPLANT
BNDG GAUZE DERMACEA FLUFF 4 (GAUZE/BANDAGES/DRESSINGS) IMPLANT
CANISTER SUCT 3000ML PPV (MISCELLANEOUS) ×1 IMPLANT
CHLORAPREP W/TINT 26 (MISCELLANEOUS) ×1 IMPLANT
COVER SURGICAL LIGHT HANDLE (MISCELLANEOUS) ×1 IMPLANT
DRAIN CHANNEL 19F RND (DRAIN) IMPLANT
DRAPE LAPAROSCOPIC ABDOMINAL (DRAPES) ×1 IMPLANT
DRAPE WARM FLUID 44X44 (DRAPES) ×1 IMPLANT
DRSG OPSITE POSTOP 4X10 (GAUZE/BANDAGES/DRESSINGS) IMPLANT
DRSG OPSITE POSTOP 4X8 (GAUZE/BANDAGES/DRESSINGS) IMPLANT
ELECT BLADE 6.5 EXT (BLADE) IMPLANT
ELECT CAUTERY BLADE 6.4 (BLADE) ×1 IMPLANT
ELECT REM PT RETURN 9FT ADLT (ELECTROSURGICAL) ×1 IMPLANT
ELECTRODE REM PT RTRN 9FT ADLT (ELECTROSURGICAL) ×1 IMPLANT
EVACUATOR SILICONE 100CC (DRAIN) IMPLANT
GAUZE PAD ABD 8X10 STRL (GAUZE/BANDAGES/DRESSINGS) IMPLANT
GAUZE SPONGE 4X4 12PLY STRL (GAUZE/BANDAGES/DRESSINGS) IMPLANT
GLOVE BIO SURGEON STRL SZ7 (GLOVE) ×1 IMPLANT
GLOVE BIOGEL PI IND STRL 7.5 (GLOVE) ×1 IMPLANT
GOWN STRL REUS W/ TWL LRG LVL3 (GOWN DISPOSABLE) ×2 IMPLANT
GOWN STRL REUS W/TWL XL LVL3 (GOWN DISPOSABLE) ×1 IMPLANT
HANDLE SUCTION POOLE (INSTRUMENTS) ×1 IMPLANT
KIT BASIN OR (CUSTOM PROCEDURE TRAY) ×1 IMPLANT
KIT TURNOVER KIT B (KITS) ×1 IMPLANT
LIGASURE IMPACT 36 18CM CVD LR (INSTRUMENTS) IMPLANT
NS IRRIG 1000ML POUR BTL (IV SOLUTION) ×2 IMPLANT
PACK GENERAL/GYN (CUSTOM PROCEDURE TRAY) ×1 IMPLANT
PAD ARMBOARD 7.5X6 YLW CONV (MISCELLANEOUS) ×1 IMPLANT
RELOAD PROXIMATE 75MM BLUE (ENDOMECHANICALS) ×3 IMPLANT
RELOAD STAPLE 75 3.8 BLU REG (ENDOMECHANICALS) IMPLANT
SPONGE T-LAP 18X18 ~~LOC~~+RFID (SPONGE) IMPLANT
STAPLER PROXIMATE 75MM BLUE (STAPLE) IMPLANT
STAPLER VISISTAT 35W (STAPLE) IMPLANT
SUCTION POOLE HANDLE (INSTRUMENTS) ×1 IMPLANT
SUT ETHILON 2 0 FS 18 (SUTURE) IMPLANT
SUT NOVA 1 T20/GS 25DT (SUTURE) IMPLANT
SUT NOVA NAB DX-16 0-1 5-0 T12 (SUTURE) IMPLANT
SUT PDS AB 1 TP1 54 (SUTURE) IMPLANT
SUT PDS AB 1 TP1 96 (SUTURE) ×2 IMPLANT
SUT SILK 2 0 SH CR/8 (SUTURE) ×1 IMPLANT
SUT SILK 2 0 TIES 10X30 (SUTURE) ×1 IMPLANT
SUT SILK 3 0 SH CR/8 (SUTURE) ×1 IMPLANT
SUT SILK 3 0 TIES 10X30 (SUTURE) ×1 IMPLANT
SUT VIC AB 2-0 SH 27XBRD (SUTURE) IMPLANT
SUT VIC AB 3-0 SH 18 (SUTURE) IMPLANT
TOWEL GREEN STERILE (TOWEL DISPOSABLE) ×1 IMPLANT
TRAY FOLEY MTR SLVR 16FR STAT (SET/KITS/TRAYS/PACK) IMPLANT
YANKAUER SUCT BULB TIP NO VENT (SUCTIONS) IMPLANT

## 2023-04-10 NOTE — H&P (Signed)
 Elijah [name] [birth year]  [id number].    Requesting MD: Ellouise, MD Chief Complaint/Reason for Consult: small bowel mass with perforation  HPI:  Elijah [name] is a 68 y/o M with PMH polymyalgia rheumatica on steroids, BPH, and GERD who [facility] due to abdominal pain. Pain started 3 days ago. Associated symptoms include abdominal distention, poor PO intake, and dry heaves. The pain got acutely worse and sharp in nature early this morning, so he came in for evaluation. Patients wife reported she started having him take some pepcid  and a laxative 3 days ago given chronic steroids. Initially pain improved but then acutely worsened. Pain primarily in left abdomen and worse with movement/palpation. He denies fever, chills, chest pain, diarrhea, or constipation. Denies blood in his stool. At baseline he lives at home with his wife who is a retired [employer]. He does not use any assistive aid for mobilization. He denies tobacco or drug use. Reports occasional social EtOH. He denies the use of blood thinners. Denies history of abdominal surgery. States he is on prednisone , current dose 25  mg. No known family hx of GI malignancy.   ROS: as above Negative other than HPI  History reviewed. No pertinent family history.  Past Medical History:  Diagnosis Date   Acid reflux    Allergic rhinitis    Asthma    Bloating    BPH (benign prostatic hyperplasia)    Depression    ED (erectile dysfunction)    Epigastric pain    Exercise-induced asthma    GERD (gastroesophageal reflux disease)    History of kidney stones    Hypercholesteremia    Migraine 2009   Polymyalgia rheumatica (HCC)    Prediabetes    Prostate disease    Sleep apnea     Past Surgical History:  Procedure Laterality Date   ANKLE SURGERY Right    CHEST TUBE INSERTION Left    COLONOSCOPY     CYSTOSCOPY WITH RETROGRADE PYELOGRAM, URETEROSCOPY AND STENT PLACEMENT Left 06/29/2020   Procedure: CYSTOSCOPY  WITH RETROGRADE PYELOGRAM, URETEROSCOPY , BASKET EXTRACTION, AND STENT PLACEMENT;  Surgeon: Alvaro Hummer, MD;  Location: [facility];  Service: Urology;  Laterality: Left;  1 HR   KNEE SURGERY Right    SKIN CANCER EXCISION     TONSILLECTOMY     VASECTOMY     WISDOM TOOTH EXTRACTION      Social History:  reports that he has never smoked. He has never used smokeless tobacco. He reports current alcohol use. He reports that he does not use drugs.  Allergies:  Allergies  Allergen Reactions   Finasteride  Anaphylaxis   Singulair [Montelukast] Hives   Crestor [Rosuvastatin] Other (See Comments)    Muscle Aches     (Not in a hospital admission)    Physical Exam: Blood pressure 133/66, pulse 92, temperature 97.6 F (36.4 C), temperature source Oral, resp. rate 20, height 6' 1 (1.854 m), weight 106.6 kg, SpO2 91%. General: Pleasant male  laying on hospital bed, appears stated age, NAD. HEENT: head -normocephalic, atraumatic; Eyes: EOMI, no conjunctival injection, anicteric sclerae Neck- Trachea is midline CV- RRR , no lower extremity edema  Pulm- breathing is non-labored ORA. No wheezes, rhales, rhonchi. Abd- soft, distended, TTP in the left hemi-abdomen with guarding and peritonitis, no hernias or masses. No surgical scars. NGT with thick bilious drainage GU- deferred  MSK- UE/LE symmetrical, no cyanosis, clubbing, or edema. Neuro- CN II-XII grossly in tact, no paresthesias. Psych- Alert  and Oriented x3 with appropriate affect Skin: warm and dry, no rashes or lesions   Results for orders placed or performed during the hospital encounter of 04/10/23 (from the past 48 hours)  Comprehensive metabolic panel     Status: Abnormal   Collection Time: 04/10/23  6:29 AM  Result Value Ref Range   Sodium 141 135 - 145 mmol/L   Potassium 3.8 3.5 - 5.1 mmol/L   Chloride 102 98 - 111 mmol/L   CO2 24 22 - 32 mmol/L   Glucose, Bld 200 (H) 70 - 99 mg/dL    Comment: Glucose reference range applies  only to samples taken after fasting for at least 8 hours.   BUN 28 (H) 8 - 23 mg/dL   Creatinine, Ser 8.41 (H) 0.61 - 1.24 mg/dL   Calcium 9.6 8.9 - 89.6 mg/dL   Total Protein 6.9 6.5 - 8.1 g/dL   Albumin  4.0 3.5 - 5.0 g/dL   AST 18 15 - 41 U/L   ALT 23 0 - 44 U/L   Alkaline Phosphatase 61 38 - 126 U/L   Total Bilirubin 0.4 0.0 - 1.2 mg/dL   GFR, Estimated 48 (L) >60 mL/min    Comment: (NOTE) Calculated using the CKD-EPI Creatinine Equation (2021)    Anion gap 15 5 - 15    Comment: Performed at [facility], [address] [place], [place] [address]  Lipase, blood     Status: None   Collection Time: 04/10/23  6:29 AM  Result Value Ref Range   Lipase 11 11 - 51 U/L    Comment: Performed at [facility], [address] [place], [place] [address]  CBC with Differential     Status: Abnormal   Collection Time: 04/10/23  6:29 AM  Result Value Ref Range   WBC 15.1 (H) 4.0 - 10.5 K/uL   RBC 4.81 4.22 - 5.81 MIL/uL   Hemoglobin 13.5 13.0 - 17.0 g/dL   HCT 57.8 60.9 - 47.9 %   MCV 87.5 80.0 - 100.0 fL   MCH 28.1 26.0 - 34.0 pg   MCHC 32.1 30.0 - 36.0 g/dL   RDW 84.8 88.4 - 84.4 %   Platelets 379 150 - 400 K/uL   nRBC 0.0 0.0 - 0.2 %   Neutrophils Relative % 77 %   Neutro Abs 11.5 (H) 1.7 - 7.7 K/uL   Lymphocytes Relative 16 %   Lymphs Abs 2.5 0.7 - 4.0 K/uL   Monocytes Relative 5 %   Monocytes Absolute 0.8 0.1 - 1.0 K/uL   Eosinophils Relative 1 %   Eosinophils Absolute 0.1 0.0 - 0.5 K/uL   Basophils Relative 0 %   Basophils Absolute 0.1 0.0 - 0.1 K/uL   Immature Granulocytes 1 %   Abs Immature Granulocytes 0.10 (H) 0.00 - 0.07 K/uL    Comment: Performed at [facility], [address], [place], [place] [address]  Urinalysis, Routine w reflex microscopic -Urine, Clean Catch     Status: None   Collection Time: 04/10/23  6:30 AM  Result Value Ref Range   Color, Urine YELLOW YELLOW   APPearance CLEAR CLEAR   Specific  Gravity, Urine 1.022 1.005 - 1.030   pH 5.0 5.0 - 8.0   Glucose, UA NEGATIVE NEGATIVE mg/dL   Hgb urine dipstick NEGATIVE NEGATIVE   Bilirubin Urine NEGATIVE NEGATIVE   Ketones, ur NEGATIVE NEGATIVE mg/dL   Protein, ur NEGATIVE NEGATIVE mg/dL   Nitrite NEGATIVE NEGATIVE   Leukocytes,Ua NEGATIVE  NEGATIVE    Comment: Performed at [facility], [address], [place], [place] [address]   CT ABDOMEN PELVIS W CONTRAST Result Date: 04/10/2023 CLINICAL DATA:  Left lower quadrant abdominal pain.  Dry heaves. EXAM: CT ABDOMEN AND PELVIS WITH CONTRAST TECHNIQUE: Multidetector CT imaging of the abdomen and pelvis was performed using the standard protocol following bolus administration of intravenous contrast. RADIATION DOSE REDUCTION: This exam was performed according to the departmental dose-optimization program which includes automated exposure control, adjustment of the mA and/or kV according to patient size and/or use of iterative reconstruction technique. CONTRAST:  OMNIPAQUE  IOHEXOL  300 MG/ML  SOLN COMPARISON:  12/07/2019 FINDINGS: Lower chest: Dependent atelectasis. Hepatobiliary: No suspicious focal abnormality within the liver parenchyma. There is no evidence for gallstones, gallbladder wall thickening, or pericholecystic fluid. No intrahepatic or extrahepatic biliary dilation. Pancreas: No focal mass lesion. No dilatation of the main duct. No intraparenchymal cyst. No peripancreatic edema. Spleen: No splenomegaly. No suspicious focal mass lesion. Adrenals/Urinary Tract: No adrenal nodule or mass. Small bilateral renal cysts noted. No followup imaging is recommended. 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 No followup imaging is recommended. No evidence for hydroureter. Mild circumferential bladder wall thickening likely accentuated by underdistention. Stomach/Bowel: Stomach is unremarkable. No gastric wall thickening. No evidence of outlet  obstruction. Duodenum is normally positioned as is the ligament of Treitz. 13.9 x 9.9 x 12.4 cm mass lesion is identified in the left abdomen. This lesion contains central fluid, debris and gas and is contiguous with the lumen of the small bowel. The terminal ileum is normal. The appendix is normal. Colon is nondilated. There is some mild wall thickening in the descending colon is a tracks along the small bowel mass, likely secondary. Vascular/Lymphatic: There is mild atherosclerotic calcification of the abdominal aorta without aneurysm. There is no gastrohepatic or hepatoduodenal ligament lymphadenopathy. No retroperitoneal or mesenteric lymphadenopathy. No pelvic sidewall lymphadenopathy. Reproductive: Prostate gland is mildly enlarged. Other: Small volume ascites. Moderate volume intraperitoneal free gas. Musculoskeletal: No worrisome lytic or sclerotic osseous abnormality. IMPRESSION: 1. 13.9 x 9.9 x 12.4 cm mass lesion in the left abdomen containing central fluid, debris and gas and is contiguous with the lumen of the small bowel. 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 The presence of associated intraperitoneal free gas is consistent with perforation of the lesion/involved small bowel segment. 2. Small volume ascites. 3. Mild wall thickening in the descending colon is a tracks along the small bowel mass, likely secondary. 4.  Aortic Atherosclerosis (ICD10-I70.0). Critical Value/emergent results were called by telephone at the time of interpretation on 04/10/2023 at 9:38 am to provider Dr. Ellouise, who verbally acknowledged these results. Electronically Signed   By: Camellia Candle M.D.   On: 04/10/2023 09:38      Assessment/Plan Intra-abdominal mass involving the small bowel with evidence of perforation 68 y/o M on chronic steroids for polymyalgia presenting with acute abdominal pain and nausea.  Workup significant for sinus tachycardia that has improved with fluid resuscitation. WBC 15, AKI with creatinine of 1.51. CT of the abdomen and pelvis shows a mass in the left abdomen associated with the small bowel, along with pneumoperitoneum. Recommend proceeding to the operating room for treatment of suspected perforated small bowel. Patient was started on IV abx and stress dose steroids in the ED.   The operative and non-operative management of pneumoperitoneum was  discussed with the patient. Risks of surgery including bleeding, infection, damage to surrounding structures,  drain placement, open abdomen, need for additional procedures, prolonged hospital stay, as well as the risks of general anesthesia were discussed with the patient and he would like to proceed with surgery. Questions were welcomed and answered.    FEN - NPO, NGT to LIWS VTE - SCD's ID - Zosyn  Admit - CCS service, may need CCM consult vs TRH consult pos-op pending operative findings and clinical condition post-op.   Polymyalgia Rheumatica on chronic steroids  GERD BPH OSA on CPAP Class 1 obesity   I reviewed nursing notes, ED provider notes, last 24 h vitals and pain scores, last 48 h intake and output, last 24 h labs and trends, and last 24 h imaging results.  HIGH MDM   Burnard Louder, [facility] Surgery 04/10/2023, 10:19 AM Please see Amion for pager number during day hours 7:00am-4:30pm or 7:00am -11:30am on weekends

## 2023-04-10 NOTE — ED Provider Notes (Signed)
 Patient signed out to me at 0700 by Dr. Roselyn pending labs and CT.  In short this is a 68 year old male with a past medical history of polymyalgia rheumatica on chronic steroids presenting to the emergency department with left lower quadrant abdominal pain.  Initially tachycardic on arrival and otherwise hemodynamically stable.  He has been given pain and nausea control, labs are pending at this time as well as CT of his abdomen and pelvis.  On my evaluation, the patient reported no significant improvement of pain with the morphine , mild improvement of the nausea.  His abdomen is soft but distended and point tender in the left lower quadrant with guarding.  Patient will be given additional pain control pending his workup.  Clinical Course as of 04/10/23 1030  Thu Apr 10, 2023  0701 Care of the patient signed out a shift change pending labs and CT.  [CS]  0833 Leukocytosis, otherwise labs within normal range. CTAP pending. 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 Free air in the abdomen so perforation of tumor or small bowel. Will consult general surgery. [VK]  [id number] I spoke with Vertell with general surgery who recommended NG tube and Zosyn . Likely will need OR.  [VK]  1002 Liz recommended ED to ED transfer to [facility]. Recommended stress dose steroids in the setting of chronic prednisone  use. Dr. Kommor is accepting for transfer. [VK]    Clinical Course User Index [CS] Roselyn Carlin NOVAK, MD [VK] Kingsley, Duilio Heritage K, DO      Kingsley, Mikael Skoda K, [place] 04/10/23 1030

## 2023-04-10 NOTE — ED Notes (Signed)
 Patient arrived from [facility].  Alert and oriented.  NG tube in place.  No distress at this time.

## 2023-04-10 NOTE — Anesthesia Procedure Notes (Signed)
 Procedure Name: Intubation Date/Time: 04/10/2023 1:57 PM  Performed by: Lamar Lucie DASEN, CRNAPre-anesthesia Checklist: Patient identified, Emergency Drugs available, Suction available and Patient being monitored Patient Re-evaluated:Patient Re-evaluated prior to induction Oxygen Delivery Method: Circle system utilized Preoxygenation: Pre-oxygenation with 100% oxygen Induction Type: IV induction, Rapid sequence and Cricoid Pressure applied Laryngoscope Size: Mac and 4 Grade View: Grade I Tube type: Oral Tube size: 7.5 mm Number of attempts: 1 Airway Equipment and Method: Stylet Placement Confirmation: ETT inserted through vocal cords under direct vision, positive ETCO2 and breath sounds checked- equal and bilateral Secured at: 22 cm Tube secured with: Tape Dental Injury: Teeth and Oropharynx as per pre-operative assessment

## 2023-04-10 NOTE — ED Triage Notes (Signed)
 Pt reports severe LLQ abdominal pain waking patient up from sleep at 0230. Pt reports dry heaving over past hour. Pt denies diarrhea, fever, CP. Pt denies any surgeries or GI diagnoses.

## 2023-04-10 NOTE — Op Note (Addendum)
 Preoperative diagnosis: small bowel mass with perforation   Postoperative diagnosis: same   Procedure:  Exploratory laparotomy  Small bowel resection   Surgeon: Cordella Idler, M.D.  Asst: Burnard Louder, PA  Anesthesia: GETA  Indications for procedure: Elijah [name] is a 68 y.o. year old male on chronic steroids who presented to the ED with several days of abdominal pain that worsened suddenly overnight. A CT was performed and showed a large mass continuous with the small bowel with associated pneumoperitoneum. Given the concern for perforation I recommended that we proceed to the OR for exploration. Written consent was obtained.  Description of procedure: The patient was brought into the operative suite. Anesthesia was administered with General endotracheal anesthesia. WHO checklist was applied. The patient was then placed in supine position. The area was prepped and draped in the usual sterile fashion.  I began with a midline laparotomy with a #10 blade and carried this down through the subcutaneous tissue using electrocautery.  The linea alba was scored, the fascia elevated, and the abdomen entered sharply with scissors. There was a small rush of air and murky fluid present on entry. There was no sign of injury from entry.  I began by running the small bowel.  There was a mass of small bowel and inflammatory tissue that had become adherent to the LLQ retroperitoneum and descending colon.  I was able to bluntly separate this from the colon and retroperitoneum and elevate it out of the pelvis.  There was obvious perforation of multiple points within the small bowel related to a mass with organic material and feces throughout the cavity. There was a rind on the colon but it appeared intact.  I irrigated the cavity with several liters of sterile saline until the effluent ran clear. I then turned my attention to the small bowel and mass. The bowel proximal and distal to the mass appeared healthy.   I chose a point 5cm proximal and another 5cm distal to the mass to perform my resection. Mesenteric windows were created and the bowel divided with firings from a 75 GIA stapler. The intervening mesentery was divided using a ligasure device. The mesentery was thickened near the small bowel, but I was able to find a plane that included normal appearing mesentery.  The specimen was passed off the field to be sent to pathology. An additional margin was taken from the mesentery and passed off as another specimen.  I then ran the small bowel several times to ensure that all involved bowel had been excised.  The proximal resection point was approximately 30cm from the LOT.  I turned my attention to creating a small bowel anastomosis. The cut ends of the bowel were aligned and after making enterotomies a common channel was created using the 75 GIA stapler.  The anastomosis was closed with another firing of the GIA stapler.  A 3-0 silk crotch stitch was placed.  The mesenteric defect was then closed using a running 2-0 vicryl suture.  A 19 Fr drain was brought out through the left-sided of the abdomen and placed along the left gutter and into the cavity near the sigmoid colon.  The fascia was closed using running #1 non-looped PDS with intermittent interrupted #1 Novafil sutures as internal retention sutures. The wound was irrigated with sterile saline and packed with moist kerlix.    All sponge, needle, and instrument counts were reported as correct. The patient was awoken and brought to the PACU by anesthesia.   Findings: Mass  involving several loops of small bowel with perforation in the LLQ. SBR performed.  19Fr drain placed in the cavity.   Specimen:  Small bowel and mass Mesenteric margin  Implant: 19 Fr drain the left gutter   Blood loss: 30mL  Complications: None  CASE DATA: Type of patient?: DOW CASE (Surgical Hospitalist [facility]) Status of Case? EMERGENT Add On Infection Present At Time Of  Surgery (PATOS)?  FECULENT PERITONITIS   Cordella Idler, M.D. General Surgery [facility], [place]

## 2023-04-10 NOTE — ED Notes (Signed)
 Called [facility] for transfer to [facility] ED; accepting physician, Glendora Score, MD

## 2023-04-10 NOTE — Consult Note (Signed)
 Medical Consultation   Elijah [name]  FMW:[id number]  DOB: [birth year]  DOA: 04/10/2023  PCP: Seabron Lenis, MD Outpatient Specialists:    Requesting physician: Dr. Cordella Moder  Reason for consultation: Postop management of polymyalgia rheumatica, chronic steroids and other medical problems.   History of Present Illness: Elijah [name] is an 68 y.o. married male, retired psychologist, forensic, independent, medical history significant for polymyalgia rheumatica on chronic steroids since September 2024, GERD, OSA on nightly CPAP, BPH, presented to the ED 04/10/2023 with complaints of 3 days of abdominal pain that suddenly worsened the day prior to ED visit.  CT abdomen concerning for small bowel mass with perforation.  Surgeons admitted the patient and he underwent exploratory laparotomy and SBR (operative report not published yet so details not known).  Patient seen in PACU.  Apart from expected abdominal pain, he denies any complaints.  No chest pain, dyspnea or palpitations.   Review of Systems:  ROS All other systems reviewed and apart from HPI, all others are negative.  Although documented in PMH, patient denies history of asthma, BPH, hypercholesterolemia, prediabetes.  Past Medical History: Past Medical History:  Diagnosis Date   Acid reflux    Allergic rhinitis    Asthma    Bloating    BPH (benign prostatic hyperplasia)    Depression    ED (erectile dysfunction)    Epigastric pain    Exercise-induced asthma    GERD (gastroesophageal reflux disease)    History of kidney stones    Hypercholesteremia    Migraine 2009   Polymyalgia rheumatica (HCC)    Prediabetes    Prostate disease    Sleep apnea     Past Surgical History: Past Surgical History:  Procedure Laterality Date   ANKLE SURGERY Right    CHEST TUBE INSERTION Left    COLONOSCOPY     CYSTOSCOPY WITH RETROGRADE PYELOGRAM, URETEROSCOPY AND STENT PLACEMENT Left 06/29/2020   Procedure:  CYSTOSCOPY WITH RETROGRADE PYELOGRAM, URETEROSCOPY , BASKET EXTRACTION, AND STENT PLACEMENT;  Surgeon: Alvaro Hummer, MD;  Location: [facility];  Service: Urology;  Laterality: Left;  1 HR   KNEE SURGERY Right    SKIN CANCER EXCISION     TONSILLECTOMY     VASECTOMY     WISDOM TOOTH EXTRACTION       Allergies:   Allergies  Allergen Reactions   Singulair [Montelukast] Hives   Amiodarone     Very aggressive behavior   Crestor [Rosuvastatin] Other (See Comments)    Muscle Aches      Social History:  reports that he has never smoked. He has never used smokeless tobacco. He reports current alcohol use. He reports that he does not use drugs.   Family History: History reviewed. No pertinent family history.    Physical Exam: Vitals:   04/10/23 1645 04/10/23 1700 04/10/23 1715 04/10/23 1740  BP: (!) 158/81 (!) 142/71 133/70   Pulse: 96 95 92   Resp: 15 17 19 19   Temp:      TempSrc:      SpO2: 93% 94% 96% 97%  Weight:      Height:        Constitutional: Middle-age male, moderately built and obese lying comfortably propped up in bed without distress.  On [place] oxygen.   Eyes: PERLA, EOMI, irises appear normal, anicteric sclera,  ENMT: external ears and nose appear normal, hearing normal, Lips appears normal, oropharynx  mucosa, tongue, posterior pharynx appear normal.  Has NG tube in the right nostril. Neck: neck appears normal, no masses, normal ROM, no thyromegaly, no JVD  CVS: S1-S2 clear, RRR, no murmur rubs or gallops, no LE edema, normal pedal pulses.  Telemetry personally reviewed at bedside: Sinus rhythm. Respiratory:  clear to auscultation bilaterally, no wheezing, rales or rhonchi. Respiratory effort normal. No accessory muscle use.  Abdomen: Postop dressing clean and dry.  Abdomen appears mildly distended.  Appropriate mild postop tenderness.  Diminished bowel sounds. Musculoskeletal: : no cyanosis, clubbing or edema noted bilaterally. Joint/bones/muscle exam, strength,  contractures or atrophy Neuro: Cranial nerves II-XII intact, strength, sensation, reflexes Psych: judgement and insight appear normal, stable mood and affect, mental status Skin: Has skin changes of right upper anterior chest and right forearm which she reports as from hemangioma, chronic.   Data reviewed:  I have personally reviewed following labs and imaging studies Labs:  CBC: Recent Labs  Lab 04/10/23 0629  WBC 15.1*  NEUTROABS 11.5*  HGB 13.5  HCT 42.1  MCV 87.5  PLT 379    Basic Metabolic Panel: Recent Labs  Lab 04/10/23 0629  NA 141  K 3.8  CL 102  CO2 24  GLUCOSE 200*  BUN 28*  CREATININE 1.58*  CALCIUM 9.6   GFR Estimated Creatinine Clearance: 59.7 mL/min (A) (by C-G formula based on SCr of 1.58 mg/dL (H)). Liver Function Tests: Recent Labs  Lab 04/10/23 0629  AST 18  ALT 23  ALKPHOS 61  BILITOT 0.4  PROT 6.9  ALBUMIN  4.0   Recent Labs  Lab 04/10/23 0629  LIPASE 11    CBG: Recent Labs  Lab 04/10/23 1246  GLUCAP 204*    Urinalysis    Component Value Date/Time   COLORURINE YELLOW 04/10/2023 0630   APPEARANCEUR CLEAR 04/10/2023 0630   LABSPEC 1.022 04/10/2023 0630   PHURINE 5.0 04/10/2023 0630   GLUCOSEU NEGATIVE 04/10/2023 0630   HGBUR NEGATIVE 04/10/2023 0630   BILIRUBINUR NEGATIVE 04/10/2023 0630   KETONESUR NEGATIVE 04/10/2023 0630   PROTEINUR NEGATIVE 04/10/2023 0630   NITRITE NEGATIVE 04/10/2023 0630   LEUKOCYTESUR NEGATIVE 04/10/2023 0630     Microbiology Recent Results (from the past 240 hours)  Aerobic/Anaerobic Culture w Gram Stain (surgical/deep wound)     Status: None (Preliminary result)   Collection Time: 04/10/23  2:15 PM   Specimen: Path fluid; Body Fluid  Result Value Ref Range Status   Specimen Description PERITONEAL  Final   Special Requests NONE  Final   Gram Stain   Final    ABUNDANT WBC PRESENT, PREDOMINANTLY PMN RARE GRAM POSITIVE RODS CRITICAL RESULT CALLED TO, READ BACK BY AND VERIFIED WITH: MD  MEZZER ON 04/10/23 @ 1537 BY DRT Performed at [facility], [address] [place], [place] [address]    Culture PENDING  Incomplete   Report Status PENDING  Incomplete       Inpatient Medications:   Scheduled Meds:  fentaNYL        fentaNYL        hydrocortisone  sodium succinate   100 mg Intravenous Q8H   HYDROmorphone    Intravenous Q4H   HYDROmorphone        ketorolac        pantoprazole  (PROTONIX ) IV  40 mg Intravenous Q24H   Continuous Infusions:  acetaminophen        Radiological Exams on Admission: DG Abd Portable 1 View Result Date: 04/10/2023 CLINICAL DATA:  68 year old male NG tube placement. EXAM: PORTABLE ABDOMEN - 1 VIEW COMPARISON:  CT Abdomen and Pelvis 0852 hours today. FINDINGS: Supine AP view at 1053 hours. Enteric tube tip has been placed into the stomach. The side hole is not apparent. Lung bases and bowel gas pattern appears stable, pneumoperitoneum more evident by CT. IMPRESSION: Enteric tube tip placed into the proximal stomach. Side hole not identified, consider advancement of several cm to ensure side hole placement within the stomach. Electronically Signed   By: VEAR Hurst M.D.   On: 04/10/2023 11:08   CT ABDOMEN PELVIS W CONTRAST Result Date: 04/10/2023 CLINICAL DATA:  Left lower quadrant abdominal pain.  Dry heaves. EXAM: CT ABDOMEN AND PELVIS WITH CONTRAST TECHNIQUE: Multidetector CT imaging of the abdomen and pelvis was performed using the standard protocol following bolus administration of intravenous contrast. RADIATION DOSE REDUCTION: This exam was performed according to the departmental dose-optimization program which includes automated exposure control, adjustment of the mA and/or kV according to patient size and/or use of iterative reconstruction technique. CONTRAST:  OMNIPAQUE  IOHEXOL  300 MG/ML  SOLN COMPARISON:  12/07/2019 FINDINGS: Lower chest: Dependent atelectasis. Hepatobiliary: No suspicious focal abnormality within the liver parenchyma. There  is no evidence for gallstones, gallbladder wall thickening, or pericholecystic fluid. No intrahepatic or extrahepatic biliary dilation. Pancreas: No focal mass lesion. No dilatation of the main duct. No intraparenchymal cyst. No peripancreatic edema. Spleen: No splenomegaly. No suspicious focal mass lesion. Adrenals/Urinary Tract: No adrenal nodule or mass. Small bilateral renal cysts noted. No followup imaging is recommended. 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 No followup imaging is recommended. No evidence for hydroureter. Mild circumferential bladder wall thickening likely accentuated by underdistention. Stomach/Bowel: Stomach is unremarkable. No gastric wall thickening. No evidence of outlet obstruction. Duodenum is normally positioned as is the ligament of Treitz. 13.9 x 9.9 x 12.4 cm mass lesion is identified in the left abdomen. This lesion contains central fluid, debris and gas and is contiguous with the lumen of the small bowel. The terminal ileum is normal. The appendix is normal. Colon is nondilated. There is some mild wall thickening in the descending colon is a tracks along the small bowel mass, likely secondary. Vascular/Lymphatic: There is mild atherosclerotic calcification of the abdominal aorta without aneurysm. There is no gastrohepatic or hepatoduodenal ligament lymphadenopathy. No retroperitoneal or mesenteric lymphadenopathy. No pelvic sidewall lymphadenopathy. Reproductive: Prostate gland is mildly enlarged. Other: Small volume ascites. Moderate volume intraperitoneal free gas. Musculoskeletal: No worrisome lytic or sclerotic osseous abnormality. IMPRESSION: 1. 13.9 x 9.9 x 12.4 cm mass lesion in the left abdomen containing central fluid, debris and gas and is contiguous with the lumen of the small bowel. 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 The  presence of associated intraperitoneal free gas is consistent with perforation of the lesion/involved small bowel segment. 2. Small volume ascites. 3. Mild wall thickening in the descending colon is a tracks along the small bowel mass, likely secondary. 4.  Aortic Atherosclerosis (ICD10-I70.0). Critical Value/emergent results were called by telephone at the time of interpretation on 04/10/2023 at 9:38 am to provider Dr. Ellouise, who verbally acknowledged these results. Electronically Signed   By: Camellia Candle M.D.   On: 04/10/2023 09:38    Impression/Recommendations Principal Problem:   Small bowel perforation (HCC)  Small bowel mass with perforation Management per primary service. Seen immediate postop, will be n.p.o., NG tube to low wall suction, IV fluids, likely until bowel functions return  which may be 1 to several days. Follow-up pathology reports  Polymyalgia rheumatica Per patient, diagnosed by PCP Has new rheumatology consultation appointment later this month.  Patient cannot recollect name of rheumatologist Was initially started on prednisone  40 mg daily in September 2024 and has been gradually tapered down to 22.5 mg daily. Since patient is going to be n.p.o. and given current surgical stressful situation, will continue IV hydrocortisone  100 Mg every 8 hours.  This can be gradually reduced or if tolerating p.o., switch to p.o. prednisone .  GERD Added IV PPI  Hyperglycemia Patient denies history of prediabetes or diabetes Check A1c Agree with NovoLog  SSI and adjust insulins as needed  Stage IIIa chronic kidney disease Presented with creatinine of 1.58.  Most recent creatinine in 2009 was in the 1.2-1.5 range then no further results noted since then.   Follow BMP closely. Avoid hypotension and nephrotoxic drugs.  OSA on nightly CPAP Hold off on CPAP while he has an NG tube and immediate postop. CCS team to advise when okay to resume CPAP.  BPH: Resume Proscar  when able to  take p.o.  Asthma Patient denies history of this. As needed albuterol  nebs.  Hypoxia As per PACU nurse, oxygen saturations in the 87-88% on room air, likely due to anesthetic effect and splinting of diaphragm from abdominal pain/recent surgery Acute respiratory failure with hypoxia ruled out Incentive spirometry, titrate oxygen to maintain saturations >92% and wean off as tolerated.  Early mobilization.   Thank you for this consultation.  We will follow the patient along with you.   Time Spent: 60 minutes  Trenda Mar, MD,  FACP, [facility], [facility], [facility] Ad[facility] [place]     To contact the attending provider between 7A-7P or the covering provider during after hours 7P-7A, please log into the web site [email or web address] and access using universal [place] password for that web site. If you do not have the password, please call the hospital operator.   To contact the [facility] consulting provider between 7A-7P or the covering provider during after hours 7P-7A, please log into the web site [email or web address] and access using universal [place] password for that web site. If you do not have the password, please call the hospital operator.

## 2023-04-10 NOTE — Anesthesia Preprocedure Evaluation (Signed)
 Anesthesia Evaluation  Patient identified by MRN, date of birth, ID band Patient awake    Reviewed: Allergy & Precautions, H&P , NPO status , Patient's Chart, lab work & pertinent test results  Airway Mallampati: II   Neck ROM: full    Dental   Pulmonary asthma , sleep apnea    breath sounds clear to auscultation       Cardiovascular negative cardio ROS  Rhythm:regular Rate:Normal     Neuro/Psych  Headaches PSYCHIATRIC DISORDERS  Depression       GI/Hepatic ,GERD  ,,Small bowel mass with perforation.   Endo/Other    Renal/GU ARFRenal diseaseCr 1.58     Musculoskeletal   Abdominal   Peds  Hematology   Anesthesia Other Findings   Reproductive/Obstetrics                             Anesthesia Physical Anesthesia Plan  ASA: 3 and emergent  Anesthesia Plan: General   Post-op Pain Management:    Induction: Intravenous, Rapid sequence and Cricoid pressure planned  PONV Risk Score and Plan: 2 and Ondansetron , Dexamethasone , Midazolam  and Treatment may vary due to age or medical condition  Airway Management Planned: Oral ETT  Additional Equipment:   Intra-op Plan:   Post-operative Plan: Extubation in OR  Informed Consent: I have reviewed the patients History and Physical, chart, labs and discussed the procedure including the risks, benefits and alternatives for the proposed anesthesia with the patient or authorized representative who has indicated his/her understanding and acceptance.     Dental advisory given  Plan Discussed with: CRNA, Anesthesiologist and Surgeon  Anesthesia Plan Comments:        Anesthesia Quick Evaluation

## 2023-04-10 NOTE — Transfer of Care (Signed)
 Immediate Anesthesia Transfer of Care Note  Patient: Lynwood KATHEE Paras  Procedure(s) Performed: EXPLORATORY LAPAROTOMY (Abdomen) SMALL BOWEL RESECTION WITH MASS (Abdomen)  Patient Location: PACU  Anesthesia Type:General  Level of Consciousness: awake, alert , and oriented  Airway & Oxygen Therapy: Patient Spontanous Breathing and Patient connected to nasal cannula oxygen  Post-op Assessment: Report given to RN and Post -op Vital signs reviewed and stable  Post vital signs: Reviewed and stable  Last Vitals:  Vitals Value Taken Time  BP 142/71 04/10/23 1700  Temp 37 C 04/10/23 1610  Pulse 100 04/10/23 1704  Resp 17 04/10/23 1704  SpO2 96 % 04/10/23 1704  Vitals shown include unfiled device data.  Last Pain:  Vitals:   04/10/23 1610  TempSrc:   PainSc: 5       Patients Stated Pain Goal: 0 (04/10/23 0720)  Complications: No notable events documented.

## 2023-04-10 NOTE — ED Notes (Signed)
 Transport to ct

## 2023-04-10 NOTE — ED Provider Notes (Signed)
 Patient transferred from [facility] emergency department, please see notes from Drs. Roselyn armin Later for full details, but in brief, Elijah [name] is a 68 y.o. male presented with left lower quadrant abdominal pain and vomiting.  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  General sonorous Ureh consulted and requested ED to ED transfer for emergent surgical evaluation.  NG tube placed and patient started on IV Zosyn .  BP 123/73 (BP Location: Right Arm)   Pulse 94   Temp 99.1 F (37.3 C) (Oral)   Resp 17   Ht 6' 1 (1.854 m)   Wt 106.6 kg   SpO2 97%   BMI 31.00 kg/m    ED Course / MDM   Clinical Course as of 04/10/23 1205  Thu Apr 10, 2023  0701 Care of the patient signed out a shift change pending labs and CT.  [CS]  0833 Leukocytosis, otherwise labs within normal range. CTAP pending. 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 Free air in the abdomen so perforation of tumor or small bowel. Will consult general surgery. [VK]  [id number] I spoke with Vertell with general surgery who recommended NG tube and Zosyn . Likely will need OR.  [VK]  1002 Liz recommended ED to ED transfer to [facility]. Recommended stress dose steroids in the setting of chronic prednisone  use. Dr. Kommor is accepting for transfer. [VK]    Clinical Course User Index [CS] Roselyn Carlin KATHEE, MD [VK] Kingsley, Victoria K, DO   Medical Decision Making Amount and/or Complexity of Data Reviewed Labs: ordered. Radiology: ordered.  Risk Prescription drug management. Decision regarding hospitalization.    On arrival to the emergency department patient reports some return of his pain, 0.5 mg of IV Dilaudid  ordered.  Patient remains NPO.  General surgery team paged and notified of patient's arrival, they are at bedside and will see and admit the patient with plans to take to the OR today.      Elijah Larraine FALCON,  PA-C 04/10/23 1225    Elijah Bernarda SQUIBB, DO 04/24/23 SHERRIAN

## 2023-04-10 NOTE — ED Provider Notes (Signed)
 [place] EMERGENCY DEPARTMENT AT [facility]  Provider Note  CSN: [id number] Arrival date & time: 04/10/23 9382  History Chief Complaint  Patient presents with   Abdominal Pain   Vomiting    Elijah [name] is a 68 y.o. male with history of polymyalgia rheumatica on prednisone  brought to the ED by his wife for evaluation of abdominal pain. He has had some bloating and mild lower abdominal pain for the last few days. Thought it might be constipation so he took some laxatives and some pepto without much improvement. He had sudden onset of severe sharp LLQ pain a few hours ago, associated with dry heaves. He felt like he may be passing a kidney stone, but pain did not improve so he came to the ED. No fevers. No prior abdominal surgeries.    Home Medications Prior to Admission medications   Medication Sig Start Date End Date Taking? Authorizing Provider  albuterol  (PROVENTIL ) (2.5 MG/3ML) 0.083% nebulizer solution Take 2.5 mg by nebulization every 6 (six) hours as needed for wheezing or shortness of breath.    [provider]  albuterol  (VENTOLIN  HFA) 108 (90 Base) MCG/ACT inhaler Inhale 2 puffs into the lungs as needed (max of 12 puffs per day) 06/24/22     alfuzosin  (UROXATRAL ) 10 MG 24 hr tablet Take 10 mg by mouth daily with breakfast.    [provider]  alfuzosin  (UROXATRAL ) 10 MG 24 hr tablet Take 1 tablet (10 mg total) by mouth daily. 01/03/23     amLODipine  (NORVASC ) 5 MG tablet Take 1 tablet (5 mg total) by mouth daily. 09/18/21   Claudene Victory ORN, MD  b complex vitamins capsule Take 1 capsule by mouth daily.    [provider]  BLACK ELDERBERRY PO Take 1,150 mg by mouth daily.    [provider]  CINNAMON PO Take 1 capsule by mouth daily.    [provider]  finasteride  (PROSCAR ) 5 MG tablet Take 1 tablet (5 mg total) by mouth daily. 02/11/23     minoxidil  (LONITEN ) 2.5 MG tablet Take 1 tablet (2.5 mg total) by mouth daily. 12/03/22      Multiple Vitamins-Minerals (MULTIVITAMIN WITH MINERALS) tablet Take 1 tablet by mouth daily.    [provider]  naproxen  (NAPROSYN ) 500 MG tablet Take 1 tablet by mouth twice a day 05/24/22     predniSONE  (DELTASONE ) 10 MG tablet Take 3 tablets (30 mg total) by mouth daily. 01/20/23     predniSONE  (DELTASONE ) 20 MG tablet Take 2 tablets (40 mg total) by mouth daily. 01/12/23     predniSONE  (DELTASONE ) 5 MG tablet Take 5 tablets (25 mg total) by mouth daily. 02/24/23     sildenafil  (REVATIO ) 20 MG tablet Take 2-5 tablets (40-100 mg total) by mouth as directed as needed for sexual funtion. 06/24/22     tadalafil , PAH, (ADCIRCA ) 20 MG tablet Take 1 tablet (20 mg total) by mouth every 3 (three) days as needed for sexual activity 08/13/22        Allergies    Finasteride , Singulair [montelukast], and Crestor [rosuvastatin]   Review of Systems   Review of Systems Please see HPI for pertinent positives and negatives  Physical Exam BP 129/69   Pulse (!) 107   Temp 97.6 F (36.4 C) (Oral)   Resp 17   Ht 6' 1 (1.854 m)   Wt 106.6 kg   SpO2 97%   BMI 31.00 kg/m   Physical Exam Vitals and nursing note  reviewed.  Constitutional:      Appearance: Normal appearance.  HENT:     Head: Normocephalic and atraumatic.     Nose: Nose normal.     Mouth/Throat:     Mouth: Mucous membranes are moist.  Eyes:     Extraocular Movements: Extraocular movements intact.     Conjunctiva/sclera: Conjunctivae normal.  Cardiovascular:     Rate and Rhythm: Normal rate.  Pulmonary:     Effort: Pulmonary effort is normal.     Breath sounds: Normal breath sounds.  Abdominal:     General: Abdomen is flat.     Palpations: Abdomen is soft.     Tenderness: There is abdominal tenderness in the left lower quadrant. There is guarding. Negative signs include Murphy's sign and McBurney's sign.  Musculoskeletal:        General: No swelling. Normal range of motion.     Cervical back: Neck supple.   Skin:    General: Skin is warm and dry.  Neurological:     General: No focal deficit present.     Mental Status: He is alert.  Psychiatric:        Mood and Affect: Mood normal.     ED Results / Procedures / Treatments   EKG None  Procedures Procedures  Medications Ordered in the ED Medications  morphine  (PF) 4 MG/ML injection 4 mg (4 mg Intravenous Given 04/10/23 0639)  ondansetron  (ZOFRAN ) injection 4 mg (4 mg Intravenous Given 04/10/23 9361)  sodium chloride  0.9 % bolus 1,000 mL (1,000 mLs Intravenous New Bag/Given 04/10/23 0639)    Initial Impression and Plan  Patient here with LLQ abdominal pain, sudden in onset but some mild pain in recent days. He has peritoneal signs on exam concerning for perforation or abscess. Will check labs, send for CT. Pain/nausea meds for comfort.   ED Course   Clinical Course as of 04/10/23 0701  Thu Apr 10, 2023  0701 Care of the patient signed out a shift change pending labs and CT.  [CS]    Clinical Course User Index [CS] Roselyn Carlin NOVAK, MD     MDM Rules/Calculators/A&P Medical Decision Making Problems Addressed: LLQ abdominal pain: acute illness or injury  Amount and/or Complexity of Data Reviewed Labs: ordered. Radiology: ordered.  Risk Prescription drug management. Parenteral controlled substances.     Final Clinical Impression(s) / ED Diagnoses Final diagnoses:  LLQ abdominal pain    Rx / DC Orders ED Discharge Orders     None        Roselyn Carlin NOVAK, MD 04/10/23 [phone]

## 2023-04-10 NOTE — Progress Notes (Signed)
 Report received from PACU RN. Patient arrived to room 6N 03.  Patient is Alert and Oriented X4. Patient had an ex lap with a midline incision. Patient resting comfortably and call button within reach.  NG Tube to low intermittent suction.  Patient has PCA button for pain control.  Will continue to ensure safety and comfort.

## 2023-04-11 ENCOUNTER — Encounter (HOSPITAL_COMMUNITY): Payer: Self-pay | Admitting: General Surgery

## 2023-04-11 DIAGNOSIS — N189 Chronic kidney disease, unspecified: Secondary | ICD-10-CM

## 2023-04-11 DIAGNOSIS — N179 Acute kidney failure, unspecified: Secondary | ICD-10-CM

## 2023-04-11 DIAGNOSIS — E119 Type 2 diabetes mellitus without complications: Secondary | ICD-10-CM

## 2023-04-11 LAB — BASIC METABOLIC PANEL
Anion gap: 10 (ref 5–15)
BUN: 24 mg/dL — ABNORMAL HIGH (ref 8–23)
CO2: 25 mmol/L (ref 22–32)
Calcium: 9 mg/dL (ref 8.9–10.3)
Chloride: 106 mmol/L (ref 98–111)
Creatinine, Ser: 1.76 mg/dL — ABNORMAL HIGH (ref 0.61–1.24)
GFR, Estimated: 42 mL/min — ABNORMAL LOW (ref >60)
Glucose, Bld: 206 mg/dL — ABNORMAL HIGH (ref 70–99)
Potassium: 4.6 mmol/L (ref 3.5–5.1)
Sodium: 141 mmol/L (ref 135–145)

## 2023-04-11 LAB — GLUCOSE, CAPILLARY
Glucose-Capillary: 192 mg/dL — ABNORMAL HIGH (ref 70–99)
Glucose-Capillary: 192 mg/dL — ABNORMAL HIGH (ref 70–99)
Glucose-Capillary: 177 mg/dL — ABNORMAL HIGH (ref 70–99)
Glucose-Capillary: 195 mg/dL — ABNORMAL HIGH (ref 70–99)
Glucose-Capillary: 140 mg/dL — ABNORMAL HIGH (ref 70–99)
Glucose-Capillary: 173 mg/dL — ABNORMAL HIGH (ref 70–99)

## 2023-04-11 LAB — CBC
HCT: 42.5 % (ref 39.0–52.0)
Hemoglobin: 13.6 g/dL (ref 13.0–17.0)
MCH: 27.9 pg (ref 26.0–34.0)
MCHC: 32 g/dL (ref 30.0–36.0)
MCV: 87.1 fL (ref 80.0–100.0)
Platelets: 286 10*3/uL (ref 150–400)
RBC: 4.88 MIL/uL (ref 4.22–5.81)
RDW: 15.4 % (ref 11.5–15.5)
WBC: 13.7 10*3/uL — ABNORMAL HIGH (ref 4.0–10.5)
nRBC: 0 % (ref 0.0–0.2)

## 2023-04-11 LAB — CYTOLOGY - NON PAP

## 2023-04-11 MED ORDER — LIVING WELL WITH DIABETES BOOK
Freq: Once | Status: AC
  Filled 2023-04-11: qty 1

## 2023-04-11 MED ORDER — LACTATED RINGERS IV SOLN: INTRAVENOUS | Status: AC

## 2023-04-11 MED ORDER — CHLORHEXIDINE GLUCONATE CLOTH 2 % EX PADS: 6.0000 | MEDICATED_PAD | Freq: Every day | CUTANEOUS | Status: DC

## 2023-04-11 MED ORDER — PHENOL 1.4 % MT LIQD: 2.0000 | OROMUCOSAL | Status: DC | PRN

## 2023-04-11 NOTE — Progress Notes (Signed)
 PROGRESS NOTE   CAMP GOPAL  FMW:[id number]    DOB: 21-Aug-1955    DOA: 04/10/2023  PCP: Seabron Lenis, MD   I have briefly reviewed patients previous medical records in [facility].  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  CT abdomen concerning for small bowel mass with perforation.  Surgeons admitted the patient and he underwent exploratory laparotomy and small bowel resection 1/9.  TRH were consulted for assistance with management of PMR, chronic steroids and other medical problems.  Assessment & Plan:  Principal Problem:   Small bowel perforation (HCC)   Small bowel mass with perforation Management per primary service. Remains n.p.o., NG tube to low wall suction, IV fluids. No flatus or BM.  Likely to remain on bowel rest until bowel functions return.  Polymyalgia rheumatica Per patient, diagnosed by PCP Has follow-up appointment with Dr. Maya Nash, rheumatology later this month. Was initially started on prednisone  40 mg daily in September 2024 and has been gradually tapered down to 22.5 mg daily. Since patient is going to be n.p.o. until bowel functions return and given current surgical stressful situation, will continue IV hydrocortisone  100 Mg every 8 hours.  This can be gradually reduced or if tolerating p.o., switch to p.o. prednisone .   GERD Added IV PPI   Newly diagnosed type II DM with hyperglycemia Patient denies history of prediabetes or diabetes A1c 7.5.  Could also be steroid induced DM. For now continue NovoLog  SSI and adjust insulins as needed.  If CBGs consistently greater than 200,  may consider long-acting insulin . Diabetes coordinator consultation. When able to take p.o., could consider transitioning to metformin  at time of discharge.   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  Most recent creatinine in 2009 was in the 1.2-1.5 range then no further results noted since then.   Avoid hypotension and nephrotoxic drugs. Creatinine up to 1.76 AKI may be related to CT contrast he got on admission.  No hypotensive episodes noted since admission and does not appear volume depleted Check bladder scan, continue IV fluids, increased rate to 125 mL/h and trend daily BMP.   OSA on nightly CPAP Hold off on CPAP while he has an NG tube CCS team to advise when okay to resume CPAP.   BPH: Resume Proscar  when able to take p.o.   Asthma Patient denies history of this. As needed albuterol  nebs.   Hypoxia As per PACU nurse, oxygen saturations in the 87-88% on room air, likely due to anesthetic effect and splinting of diaphragm from abdominal pain/recent surgery Acute respiratory failure with hypoxia ruled out Incentive spirometry, titrate oxygen to maintain saturations >92% and wean off as tolerated.  Early mobilization. Remains on 2 L/min [place] oxygen but suspect he can be weaned off to room air.   Body mass index is 31 kg/m./Obesity Outpatient follow-up.   DVT prophylaxis: enoxaparin  (LOVENOX ) injection 40 mg Start: 04/11/23 1000 SCDs Start: 04/10/23 2101     Code Status: Full Code:  Family Communication: Spouse at bedside.  She is a retired Scientist, Forensic. Disposition:  Status is: Inpatient Remains inpatient appropriate because:  Immediate postop     Consultants:   TRH are consultants.  Procedures:   Exploratory laparotomy and small bowel resection 1/9  Antimicrobials:   IV Zosyn    Subjective:  Seen this morning.  Spouse at bedside.  Appropriate abdominal soreness but controlled on Dilaudid  PCA.  No flatus or BM.  No nausea or  vomiting.  Objective:   Vitals:   04/11/23 0011 04/11/23 0045 04/11/23 0409 04/11/23 0419  BP: (!) 154/87   128/73  Pulse: 91   96  Resp: 16 16 16 14   Temp: 98.4 F (36.9 C)   99.4 F (37.4 C)  TempSrc: Oral   Oral  SpO2: 97% 97% 97% 98%  Weight:      Height:        General exam: Middle-age male, moderately built and obese lying comfortably propped up in bed without distress. ENT: Has right nostril NG tube to low wall suction. Respiratory system: Clear to auscultation. Respiratory effort normal. Cardiovascular system: S1 & S2 heard, RRR. No JVD, murmurs, rubs, gallops or clicks. No pedal edema. Gastrointestinal system: Abdomen is mildly distended, soft, appropriate postop tenderness.  Paucity of bowel sounds.  Postop surgical dressing clean and dry. Central nervous system: Alert and oriented. No focal neurological deficits. Extremities: Symmetric 5 x 5 power. Skin: Extensive rash over right upper anterior chest and right upper extremity reported to be hemangioma per patient.  Chronic. Psychiatry: Judgement and insight appear normal. Mood & affect appropriate.     Data Reviewed:   I have personally reviewed following labs and imaging studies   CBC: Recent Labs  Lab 04/10/23 0629 04/10/23 2257 04/11/23 0631  WBC 15.1* 14.3* 13.7*  NEUTROABS 11.5*  --   --   HGB 13.5 14.1 13.6  HCT 42.1 43.0 42.5  MCV 87.5 86.9 87.1  PLT 379 296 286    Basic Metabolic Panel: Recent Labs  Lab 04/10/23 0629 04/10/23 2257 04/11/23 0631  NA 141  --  141  K 3.8  --  4.6  CL 102  --  106  CO2 24  --  25  GLUCOSE 200*  --  206*  BUN 28*  --  24*  CREATININE 1.58* 1.62* 1.76*  CALCIUM 9.6  --  9.0    Liver Function Tests: Recent Labs  Lab 04/10/23 0629  AST 18  ALT 23  ALKPHOS 61  BILITOT 0.4  PROT 6.9  ALBUMIN  4.0    CBG: Recent Labs  Lab 04/10/23 1246 04/10/23 2028 04/11/23 0430  GLUCAP 204* 211* 192*    Microbiology Studies:   Recent Results (from the  past 240 hours)  Aerobic/Anaerobic Culture w Gram Stain (surgical/deep wound)     Status: None (Preliminary result)   Collection Time: 04/10/23  2:15 PM   Specimen: Path fluid; Body Fluid  Result Value Ref Range Status   Specimen Description PERITONEAL  Final   Special Requests NONE  Final   Gram Stain   Final    ABUNDANT WBC PRESENT, PREDOMINANTLY PMN RARE GRAM POSITIVE RODS CRITICAL RESULT CALLED TO, READ BACK BY AND VERIFIED WITH: MD MEZZER ON 04/10/23 @ 1537 BY DRT    Culture   Final    RARE GRAM NEGATIVE RODS SUSCEPTIBILITIES TO FOLLOW Performed at [facility], [address] [place], [place] [address]    Report Status PENDING  Incomplete    Radiology Studies:  DG Abd Portable 1 View Result Date: 04/10/2023 CLINICAL DATA:  68 year old male NG tube placement. EXAM: PORTABLE  ABDOMEN - 1 VIEW COMPARISON:  CT Abdomen and Pelvis 0852 hours today. FINDINGS: Supine AP view at 1053 hours. Enteric tube tip has been placed into the stomach. The side hole is not apparent. Lung bases and bowel gas pattern appears stable, pneumoperitoneum more evident by CT. IMPRESSION: Enteric tube tip placed into the proximal stomach. Side hole not identified, consider advancement of several cm to ensure side hole placement within the stomach. Electronically Signed   By: VEAR Hurst M.D.   On: 04/10/2023 11:08   CT ABDOMEN PELVIS W CONTRAST Result Date: 04/10/2023 CLINICAL DATA:  Left lower quadrant abdominal pain.  Dry heaves. EXAM: CT ABDOMEN AND PELVIS WITH CONTRAST TECHNIQUE: Multidetector CT imaging of the abdomen and pelvis was performed using the standard protocol following bolus administration of intravenous contrast. RADIATION DOSE REDUCTION: This exam was performed according to the departmental dose-optimization program which includes automated exposure control, adjustment of the mA and/or kV according to patient size and/or use of iterative reconstruction technique. CONTRAST:  OMNIPAQUE  IOHEXOL   300 MG/ML  SOLN COMPARISON:  12/07/2019 FINDINGS: Lower chest: Dependent atelectasis. Hepatobiliary: No suspicious focal abnormality within the liver parenchyma. There is no evidence for gallstones, gallbladder wall thickening, or pericholecystic fluid. No intrahepatic or extrahepatic biliary dilation. Pancreas: No focal mass lesion. No dilatation of the main duct. No intraparenchymal cyst. No peripancreatic edema. Spleen: No splenomegaly. No suspicious focal mass lesion. Adrenals/Urinary Tract: No adrenal nodule or mass. Small bilateral renal cysts noted. No followup imaging is recommended. 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 No followup imaging is recommended. No evidence for hydroureter. Mild circumferential bladder wall thickening likely accentuated by underdistention. Stomach/Bowel: Stomach is unremarkable. No gastric wall thickening. No evidence of outlet obstruction. Duodenum is normally positioned as is the ligament of Treitz. 13.9 x 9.9 x 12.4 cm mass lesion is identified in the left abdomen. This lesion contains central fluid, debris and gas and is contiguous with the lumen of the small bowel. The terminal ileum is normal. The appendix is normal. Colon is nondilated. There is some mild wall thickening in the descending colon is a tracks along the small bowel mass, likely secondary. Vascular/Lymphatic: There is mild atherosclerotic calcification of the abdominal aorta without aneurysm. There is no gastrohepatic or hepatoduodenal ligament lymphadenopathy. No retroperitoneal or mesenteric lymphadenopathy. No pelvic sidewall lymphadenopathy. Reproductive: Prostate gland is mildly enlarged. Other: Small volume ascites. Moderate volume intraperitoneal free gas. Musculoskeletal: No worrisome lytic or sclerotic osseous abnormality. IMPRESSION: 1. 13.9 x 9.9 x 12.4 cm mass lesion in the left abdomen containing central fluid, debris and gas and is contiguous with the lumen of the  small bowel. 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 The presence of associated intraperitoneal free gas is consistent with perforation of the lesion/involved small bowel segment. 2. Small volume ascites. 3. Mild wall thickening in the descending colon is a tracks along the small bowel mass, likely secondary. 4.  Aortic Atherosclerosis (ICD10-I70.0). Critical Value/emergent results were called by telephone at the time of interpretation on 04/10/2023 at 9:38 am to provider Dr. Ellouise, who verbally acknowledged these results. Electronically Signed   By: Camellia Candle M.D.   On: 04/10/2023 09:38    Scheduled Meds:    enoxaparin  (LOVENOX ) injection  40 mg Subcutaneous Q24H   hydrocortisone  sodium succinate   100 mg Intravenous Q8H   HYDROmorphone    Intravenous Q4H  insulin  aspart  0-9 Units Subcutaneous Q4H   methocarbamol  (ROBAXIN ) injection  500 mg Intravenous Q8H   pantoprazole  (PROTONIX ) IV  40 mg Intravenous Q24H    Continuous Infusions:    acetaminophen  1,000 mg (04/11/23 0625)   lactated ringers  100 mL/hr at 04/10/23 2230   piperacillin -tazobactam (ZOSYN )  IV 3.375 g (04/11/23 0651)     LOS: 1 day     Trenda Mar, MD,  FACP, [facility], [facility], [facility]   [facility] [place]      To contact the attending provider between 7A-7P or the covering provider during after hours 7P-7A, please log into the web site [email or web address] and access using universal [place] password for that web site. If you do not have the password, please call the hospital operator.  04/11/2023, 8:13 AM

## 2023-04-11 NOTE — Evaluation (Signed)
 Occupational Therapy Evaluation Patient Details Name: Elijah [name] MRN: [id number] DOB: January 02, 1956 Today's Date: 04/11/2023   History of Present Illness Pt is a 68 yo male admittd with abdominal pain and underwent a small bowl resection on 04/10/23.  Pt also with CDK and DM newly diagnosed. PmH: polymyalgia rheumatica, BPH.   Clinical Impression   Pt admitted with the above diagnosis and has the deficits outlined below. Pt would benefit from cont OT to increase independence with basic adls and adl transfers back to his baseline of independent. Pt has supportive family at home that can be with him 24/7. Once medically stable, if pt progresses as expected, do not feel he will need HHOT.  Will continue to see acutely and update d/c recs as needed.        If plan is discharge home, recommend the following: A little help with bathing/dressing/bathroom;Assistance with cooking/housework;Assist for transportation;Help with stairs or ramp for entrance    Functional Status Assessment  Patient has had a recent decline in their functional status and demonstrates the ability to make significant improvements in function in a reasonable and predictable amount of time.  Equipment Recommendations  BSC/3in1    Recommendations for Other Services       Precautions / Restrictions Precautions Precautions: Other (comment);Fall (mulitple lines) Precaution Comments: Pt has been on predisone for 3 months and does not feel as steady as he used to when walking. No falls. Required Braces or Orthoses: Other Brace Other Brace: abdominal binder Restrictions Weight Bearing Restrictions Per Provider Order: No Other Position/Activity Restrictions: watch drain and lines      Mobility Bed Mobility Overal bed mobility: Needs Assistance Bed Mobility: Supine to Sit, Rolling Rolling: Supervision   Supine to sit: Min assist, HOB elevated     General bed mobility comments: extra time given and min assit to get to  full sit due to abdominal pain.   Donn binder  in supine before getting up.    Transfers Overall transfer level: Needs assistance Equipment used: 1 person hand held assist Transfers: Sit to/from Stand, Bed to chair/wheelchair/BSC Sit to Stand: Contact guard assist     Step pivot transfers: Contact guard assist     General transfer comment: CG for balance and assist to watch lines      Balance Overall balance assessment: Mild deficits observed, not formally tested                                         ADL either performed or assessed with clinical judgement   ADL Overall ADL's : Needs assistance/impaired Eating/Feeding: NPO   Grooming: Wash/dry hands;Wash/dry face;Oral care;Set up;Sitting   Upper Body Bathing: Set up;Sitting   Lower Body Bathing: Minimal assistance;Sit to/from stand   Upper Body Dressing : Minimal assistance;Sitting   Lower Body Dressing: Moderate assistance;Sit to/from stand;Cueing for compensatory techniques   Toilet Transfer: Minimal assistance;Stand-pivot;BSC/3in1   Toileting- Clothing Manipulation and Hygiene: Minimal assistance;Sit to/from stand;Cueing for compensatory techniques       Functional mobility during ADLs: Minimal assistance General ADL Comments: Pt most limited by pain     Vision Baseline Vision/History: 1 Wears glasses Ability to See in Adequate Light: 0 Adequate Patient Visual Report: No change from baseline Vision Assessment?: No apparent visual deficits     Perception Perception: Within Functional Limits       Praxis Praxis: [facility]  Pertinent Vitals/Pain Pain Assessment Pain Assessment: 0-10 Pain Score: 4  Pain Location: abdomen Pain Descriptors / Indicators: Sore Pain Intervention(s): Limited activity within patient's tolerance, Monitored during session, Repositioned     Extremity/Trunk Assessment Upper Extremity Assessment Upper Extremity Assessment: Overall WFL for tasks assessed    Lower Extremity Assessment Lower Extremity Assessment: Defer to PT evaluation   Cervical / Trunk Assessment Cervical / Trunk Assessment: Normal   Communication Communication Communication: No apparent difficulties   Cognition Arousal: Alert Behavior During Therapy: WFL for tasks assessed/performed Overall Cognitive Status: Within Functional Limits for tasks assessed                                       General Comments  Pt most limited by pain and lines    Exercises     Shoulder Instructions      Home Living Family/patient expects to be discharged to:: Private residence Living Arrangements: Spouse/significant other Available Help at Discharge: Available 24 hours/day;Family Type of Home: House Home Access: Stairs to enter [employer] of Steps: 3   Home Layout: Two level Alternate Level Stairs-Number of Steps: 10   Bathroom Shower/Tub: Tub/shower unit;Walk-in shower   Bathroom Toilet: Standard     Home Equipment: None   Additional Comments: bedroom upstairs but may sleep in recliner downstairs.      Prior Functioning/Environment Prior Level of Function : Independent/Modified Independent                        OT Problem List: Decreased activity tolerance;Impaired balance (sitting and/or standing);Pain      OT Treatment/Interventions: Self-care/ADL training;Therapeutic activities;DME and/or AE instruction;Balance training    OT Goals(Current goals can be found in the care plan section) Acute Rehab OT Goals Patient Stated Goal: to get stronger and home OT Goal Formulation: With patient Time For Goal Achievement: 04/25/23 Potential to Achieve Goals: Good ADL Goals Pt Will Perform Grooming: with supervision;standing Pt Will Perform Upper Body Bathing: with modified independence Pt Will Perform Upper Body Dressing: with modified independence Pt Will Perform Lower Body Dressing: with supervision;sit to/from stand Pt Will  Perform Tub/Shower Transfer: with supervision;ambulating Additional ADL Goal #1: Pt will walk to bathroom and complete all toileting with mod I  OT Frequency: Min 1X/week    Co-evaluation              AM-PAC OT 6 Clicks Daily Activity     Outcome Measure Help from another person eating meals?: Total Help from another person taking care of personal grooming?: A Little Help from another person toileting, which includes using toliet, bedpan, or urinal?: A Little Help from another person bathing (including washing, rinsing, drying)?: A Little Help from another person to put on and taking off regular upper body clothing?: A Little Help from another person to put on and taking off regular lower body clothing?: A Lot 6 Click Score: 15   End of Session Equipment Utilized During Treatment: Oxygen Nurse Communication: Mobility status  Activity Tolerance: Patient limited by pain Patient left: in chair;with call bell/phone within reach;with family/visitor present  OT Visit Diagnosis: Unsteadiness on feet (R26.81)                Time: 8960-8889 OT Time Calculation (min): 31 min Charges:  OT General Charges $OT Visit: 1 Visit OT Evaluation $OT Eval Moderate Complexity: 1 Mod OT Treatments $Self Care/Home Management :  8-22 mins  Joshua Silvano Dragon 04/11/2023, 11:22 AM

## 2023-04-11 NOTE — Anesthesia Postprocedure Evaluation (Signed)
 Anesthesia Post Note  Patient: Elijah [name]  Procedure(s) Performed: EXPLORATORY LAPAROTOMY (Abdomen) SMALL BOWEL RESECTION WITH MASS (Abdomen)     Patient location during evaluation: PACU Anesthesia Type: General Level of consciousness: awake Pain management: pain level controlled Vital Signs Assessment: post-procedure vital signs reviewed and stable Respiratory status: spontaneous breathing, nonlabored ventilation and respiratory function stable Cardiovascular status: blood pressure returned to baseline and stable Postop Assessment: no apparent nausea or vomiting Anesthetic complications: no   No notable events documented.  Last Vitals:  Vitals:   04/11/23 0409 04/11/23 0419  BP:  128/73  Pulse:  96  Resp: 16 14  Temp:  37.4 C  SpO2: 97% 98%    Last Pain:  Vitals:   04/11/23 0419  TempSrc: Oral  PainSc:                  Pheobe Sandiford P Elda Dunkerson

## 2023-04-11 NOTE — Inpatient Diabetes Management (Signed)
 Inpatient Diabetes Program Recommendations  AACE/ADA: New Consensus Statement on Inpatient Glycemic Control (2015)  Target Ranges:  Prepandial:   less than 140 mg/dL      Peak postprandial:   less than 180 mg/dL (1-2 hours)      Critically ill patients:  140 - 180 mg/dL   Lab Results  Component Value Date   GLUCAP 177 (H) 04/11/2023   HGBA1C 7.5 (H) 04/10/2023    Review of Glycemic Control  Diabetes history: DM2 new onset Outpatient Diabetes medications: none Current orders for Inpatient glycemic control: Novolog  0-9 units correction scale every 4 hours  Inpatient Diabetes Program Recommendations:   Spoke to patient about the new diagnosis of diabetes. States that he has never been told that he has prediabetes. States that he has been on steroids since August, 2024. Which may be causing his blood sugars to be elevated. Discusses Hgba1C of 7.5%, given information. Discussed his diet; states that he does drink sweet tea, no juices. Has some sweets every now and then. Encouraged him to cut back on sugar, especially in the tea. Suggested that he check his blood sugars at home with meter until he can see his PCP for follow up.   Discussed normal blood sugars and normal A1C. Ordered Living Well with Diabetes booklet, given information on the plate method. Will add DM exit notes for information. Will need prescription for home blood glucose meter kit (order #[id number]) for home use.   Marjorie Lunger RN BSN CDE Diabetes Coordinator Pager: [phone]  8am-5pm

## 2023-04-11 NOTE — Evaluation (Signed)
 Physical Therapy Evaluation Patient Details Name: Elijah [name] MRN: [id number] DOB: [birth year] Today's Date: 04/11/2023  History of Present Illness  Patient is 68 y.o. male presents to [facility] 04/10/23 w/ small bowel mass w/ perforation. S/p ex-lap and small bowel resection 1/9. PMHx significant for polymyalgia rheumatica, BPH, GERD, DM (newly diagnosed), asthma.   Clinical Impression  Elijah [name] is 68 y.o. male admitted with above HPI and diagnosis. Patient is currently limited by functional impairments below (see PT problem list). Patient lives with spouse and is independent at baseline. Currently he is most limited by abdominal discomfort with bed mobility and sit<>stands. Min assist for log roll to protect abdominal incision and CGA for safety with transfers and gait. Patient will benefit from continued skilled PT interventions to address impairments and progress independence with mobility, anticipate pt will progress well in acute setting. Acute PT will follow and progress as able.         If plan is discharge home, recommend the following: A little help with walking and/or transfers;Assistance with cooking/housework;Assist for transportation;Help with stairs or ramp for entrance;A little help with bathing/dressing/bathroom   Can travel by private vehicle        Equipment Recommendations None recommended by PT  Recommendations for Other Services       Functional Status Assessment Patient has had a recent decline in their functional status and demonstrates the ability to make significant improvements in function in a reasonable and predictable amount of time.     Precautions / Restrictions Precautions Precautions: Other (comment);Fall (mulitple lines) Precaution Comments: Pt has been on predisone for 3 months and does not feel as steady as he used to when walking. No falls. Required Braces or Orthoses: Other Brace Other Brace: abdominal binder Restrictions Weight Bearing  Restrictions Per Provider Order: No Other Position/Activity Restrictions: watch drain and lines      Mobility  Bed Mobility Overal bed mobility: Needs Assistance Bed Mobility: Rolling, Sidelying to Sit, Sit to Sidelying Rolling: Supervision Sidelying to sit: Min assist, HOB elevated, Used rails     Sit to sidelying: Min assist, HOB elevated, Used rails General bed mobility comments: extra time for bed mob, min assist to pivot/scoot hips anterior towards EOB and to raise trunk fully upright. Pt able to press up with Rt UE and pull up with Lt from therapist support. Min assist to bring Le's onto bed together for return to side>supine.    Transfers Overall transfer level: Needs assistance Equipment used: 1 person hand held assist Transfers: Sit to/from Stand, Bed to chair/wheelchair/BSC Sit to Stand: Contact guard assist   Step pivot transfers: Contact guard assist       General transfer comment: CGA for safety, pt with wide stance for stability.    Ambulation/Gait Ambulation/Gait assistance: Contact guard assist, Supervision Gait Distance (Feet): 250 Feet Assistive device: IV Pole Gait Pattern/deviations: Step-through pattern, Trunk flexed Gait velocity: fair     General Gait Details: overall steady gait, no LOB and pt stable. bil UE support on IV for increased trunk support and slightly flexed posture for abdominal comfort.  Stairs            Wheelchair Mobility     Tilt Bed    Modified Rankin (Stroke Patients Only)       Balance Overall balance assessment: Mild deficits observed, not formally tested  Pertinent Vitals/Pain Pain Assessment Pain Assessment: Faces Faces Pain Scale: Hurts little more Pain Location: abdomen Pain Descriptors / Indicators: Sore Pain Intervention(s): Limited activity within patient's tolerance, Monitored during session, Repositioned, PCA encouraged    Home Living  Family/patient expects to be discharged to:: Private residence Living Arrangements: Spouse/significant other Available Help at Discharge: Available 24 hours/day;Family Type of Home: House Home Access: Stairs to enter Entrance Stairs-Rails: None Entrance Stairs-Number of Steps: 3 Alternate Level Stairs-Number of Steps: 10 Home Layout: Two level Home Equipment: None Additional Comments: bedroom upstairs but may sleep in recliner downstairs.    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 [employer]       Extremity/Trunk Assessment   Upper Extremity Assessment Upper Extremity Assessment: Defer to OT evaluation;Overall [facility] for tasks assessed    Lower Extremity Assessment Lower Extremity Assessment: Overall WFL for tasks assessed    Cervical / Trunk Assessment Cervical / Trunk Assessment: Normal  Communication   Communication Communication: No apparent difficulties  Cognition Arousal: Alert Behavior During Therapy: WFL for tasks assessed/performed Overall Cognitive Status: Within Functional Limits for tasks assessed                                          General Comments      Exercises     Assessment/Plan    PT Assessment Patient needs continued PT services  PT Problem List Decreased activity tolerance;Decreased balance;Decreased mobility;Decreased knowledge of use of DME;Decreased safety awareness;Decreased knowledge of precautions;Pain       PT Treatment Interventions DME instruction;Patient/family education;Balance training;Therapeutic exercise;Therapeutic activities;Functional mobility training;Stair training;Gait training    PT Goals (Current goals can be found in the Care Plan section)  Acute Rehab PT Goals Patient Stated Goal: recover PT Goal Formulation: With patient Time For Goal Achievement:  04/25/23 Potential to Achieve Goals: Good    Frequency Min 1X/week     Co-evaluation               AM-PAC PT 6 Clicks Mobility  Outcome Measure Help needed turning from your back to your side while in a flat bed without using bedrails?: A Little Help needed moving from lying on your back to sitting on the side of a flat bed without using bedrails?: A Little Help needed moving to and from a bed to a chair (including a wheelchair)?: A Little Help needed standing up from a chair using your arms (e.g., wheelchair or bedside chair)?: A Little Help needed to walk in hospital room?: A Little Help needed climbing 3-5 steps with a railing? : A Little 6 Click Score: 18    End of Session Equipment Utilized During Treatment: Gait belt (abdominal binder) Activity Tolerance: Patient tolerated treatment well Patient left: in bed;with call bell/phone within reach;with family/visitor present Nurse Communication: Mobility status PT Visit Diagnosis: Muscle weakness (generalized) (M62.81);Difficulty in walking, not elsewhere classified (R26.2);Pain Pain - part of body:  (abdomen)    Time: 8542-8471 PT Time Calculation (min) (ACUTE ONLY): 31 min   Charges:   PT Evaluation $PT Eval Moderate Complexity: 1 Mod PT Treatments $Gait Training: 8-22 mins PT General Charges $$ ACUTE PT VISIT: 1 Visit         Vernell DONEEN KLEIN, DPT Acute Rehabilitation Services Office  [phone]  04/11/23 3:49 PM

## 2023-04-11 NOTE — Progress Notes (Signed)
 [facility] Progress Note  1 Day Post-Op  Subjective: CC:  Abdominal pain overall improved. Denies flatus. Wife at bedside   Objective: Vital signs in last 24 hours: Temp:  [97.8 F (36.6 C)-99.4 F (37.4 C)] 98.5 F (36.9 C) (01/10 0825) Pulse Rate:  [81-96] 81 (01/10 0825) Resp:  [14-22] 22 (01/10 0907) BP: (123-161)/(2-87) 133/70 (01/10 0825) SpO2:  [91 %-100 %] 96 % (01/10 0907) FiO2 (%):  [28 %] 28 % (01/09 1740) Weight:  [106.6 kg] 106.6 kg (01/09 1238)    Intake/Output from previous day: 01/09 0701 - 01/10 0700 In: 1450 [I.V.:1200; IV Piggyback:250] Out: 1355 [Urine:800; Emesis/NG output:100; Drains:205; Blood:250] Intake/Output this shift: Total I/O In: -  Out: 30 [Drains:30]  PE: Gen:  Alert, NAD, pleasant Card:  Regular rate and rhythm Pulm:  Normal effort Abd: Soft, protuberant, appropriately tender without guarding, dressing c/d/I GU: foley in place draining clear yellow urine (800 cc document, >300 mL in foley bag) Skin: warm and dry,rash arms  Psych: A&Ox3   Lab Results:  Recent Labs    04/10/23 2257 04/11/23 0631  WBC 14.3* 13.7*  HGB 14.1 13.6  HCT 43.0 42.5  PLT 296 286   BMET Recent Labs    04/10/23 0629 04/10/23 2257 04/11/23 0631  NA 141  --  141  K 3.8  --  4.6  CL 102  --  106  CO2 24  --  25  GLUCOSE 200*  --  206*  BUN 28*  --  24*  CREATININE 1.58* 1.62* 1.76*  CALCIUM 9.6  --  9.0   PT/INR No results for input(s): LABPROT, INR in the last 72 hours. CMP     Component Value Date/Time   NA 141 04/11/2023 0631   K 4.6 04/11/2023 0631   CL 106 04/11/2023 0631   CO2 25 04/11/2023 0631   GLUCOSE 206 (H) 04/11/2023 0631   BUN 24 (H) 04/11/2023 0631   CREATININE 1.76 (H) 04/11/2023 0631   CALCIUM 9.0 04/11/2023 0631   PROT 6.9 04/10/2023 0629   ALBUMIN  4.0 04/10/2023 0629   AST 18 04/10/2023 0629   ALT 23 04/10/2023 0629   ALKPHOS 61 04/10/2023 0629   BILITOT 0.4 04/10/2023 0629   GFRNONAA 42 (L)  04/11/2023 0631   GFRAA  06/17/2007 2140    >60        The eGFR has been calculated using the MDRD equation. This calculation has not been validated in all clinical   Lipase     Component Value Date/Time   LIPASE 11 04/10/2023 0629       Studies/Results: DG Abd Portable 1 View Result Date: 04/10/2023 CLINICAL DATA:  68 year old male NG tube placement. EXAM: PORTABLE ABDOMEN - 1 VIEW COMPARISON:  CT Abdomen and Pelvis 0852 hours today. FINDINGS: Supine AP view at 1053 hours. Enteric tube tip has been placed into the stomach. The side hole is not apparent. Lung bases and bowel gas pattern appears stable, pneumoperitoneum more evident by CT. IMPRESSION: Enteric tube tip placed into the proximal stomach. Side hole not identified, consider advancement of several cm to ensure side hole placement within the stomach. Electronically Signed   By: VEAR Hurst M.D.   On: 04/10/2023 11:08   CT ABDOMEN PELVIS W CONTRAST Result Date: 04/10/2023 CLINICAL DATA:  Left lower quadrant abdominal pain.  Dry heaves. EXAM: CT ABDOMEN AND PELVIS WITH CONTRAST TECHNIQUE: Multidetector CT imaging of the abdomen and pelvis was performed using the standard protocol following bolus administration  of intravenous contrast. RADIATION DOSE REDUCTION: This exam was performed according to the departmental dose-optimization program which includes automated exposure control, adjustment of the mA and/or kV according to patient size and/or use of iterative reconstruction technique. CONTRAST:  OMNIPAQUE  IOHEXOL  300 MG/ML  SOLN COMPARISON:  12/07/2019 FINDINGS: Lower chest: Dependent atelectasis. Hepatobiliary: No suspicious focal abnormality within the liver parenchyma. There is no evidence for gallstones, gallbladder wall thickening, or pericholecystic fluid. No intrahepatic or extrahepatic biliary dilation. Pancreas: No focal mass lesion. No dilatation of the main duct. No intraparenchymal cyst. No peripancreatic edema. Spleen: No  splenomegaly. No suspicious focal mass lesion. Adrenals/Urinary Tract: No adrenal nodule or mass. Small bilateral renal cysts noted. No followup imaging is recommended. 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 No followup imaging is recommended. No evidence for hydroureter. Mild circumferential bladder wall thickening likely accentuated by underdistention. Stomach/Bowel: Stomach is unremarkable. No gastric wall thickening. No evidence of outlet obstruction. Duodenum is normally positioned as is the ligament of Treitz. 13.9 x 9.9 x 12.4 cm mass lesion is identified in the left abdomen. This lesion contains central fluid, debris and gas and is contiguous with the lumen of the small bowel. The terminal ileum is normal. The appendix is normal. Colon is nondilated. There is some mild wall thickening in the descending colon is a tracks along the small bowel mass, likely secondary. Vascular/Lymphatic: There is mild atherosclerotic calcification of the abdominal aorta without aneurysm. There is no gastrohepatic or hepatoduodenal ligament lymphadenopathy. No retroperitoneal or mesenteric lymphadenopathy. No pelvic sidewall lymphadenopathy. Reproductive: Prostate gland is mildly enlarged. Other: Small volume ascites. Moderate volume intraperitoneal free gas. Musculoskeletal: No worrisome lytic or sclerotic osseous abnormality. IMPRESSION: 1. 13.9 x 9.9 x 12.4 cm mass lesion in the left abdomen containing central fluid, debris and gas and is contiguous with the lumen of the small bowel. 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 The presence of associated intraperitoneal free gas is consistent with perforation of the lesion/involved small bowel segment. 2. Small volume ascites. 3. Mild wall thickening in the descending colon is a tracks along the small bowel mass, likely secondary. 4.  Aortic  Atherosclerosis (ICD10-I70.0). Critical Value/emergent results were called by telephone at the time of interpretation on 04/10/2023 at 9:38 am to provider Dr. Ellouise, who verbally acknowledged these results. Electronically Signed   By: Camellia Candle M.D.   On: 04/10/2023 09:38    Anti-infectives: Anti-infectives (From admission, onward)    Start     Dose/Rate Route Frequency Ordered Stop   04/10/23 2200  piperacillin -tazobactam (ZOSYN ) IVPB 3.375 g        3.375 g 12.5 mL/hr over 240 Minutes Intravenous Every 8 hours 04/10/23 2101 04/17/23 2159   04/10/23 1000  piperacillin -tazobactam (ZOSYN ) IVPB 3.375 g        3.375 g 100 mL/hr over 30 Minutes Intravenous  Once 04/10/23 0948 04/10/23 1205        Assessment/Plan  Small bowel mass with perforation  POD#1 s/p exploratory laparotomy, small bowel resection 1/9 Dr. Polly -  AFVSS,  WBC 13.7 from 14.3, hgb stable 13.5 -  await path -  Continue NG to LIWS and await bowel function, ok for limited ice chips and hard candy for comfort  -  D/C foley today - moist-to-dry dressing midline wound  -  Cr 1.76 - avoid nephrotoxic agents, no toradol . Continue IVF  -  Abdominal binder ordered for when OOB - DVT ppx with lovenox  -  continue Zosyn , 5 days   Appreciate [facility] following and assisting with steroid mgmt, AKI    LOS: 1 day   I reviewed nursing notes, hospitalist notes, last 24 h vitals and pain scores, last 48 h intake and output, last 24 h labs and trends, and last 24 h imaging results.    Almarie Pringle, PA-C [facility] Please see Amion for pager number during day hours 7:00am-4:30pm

## 2023-04-11 NOTE — Progress Notes (Signed)
 Physical therapy brought to RN attention that the NG was unclamped from NG holder at nose. NG measured at 46cm at this time. RN pushed air into tube to check placement and did not hear air movement. NG advanced to 66cm per previous documentation. Air was pushed through again and gurgle was heard. NG reclamped in place at nose, low-int suction was resumed, and RN will continue to monitor.

## 2023-04-12 LAB — GLUCOSE, CAPILLARY
Glucose-Capillary: 111 mg/dL — ABNORMAL HIGH (ref 70–99)
Glucose-Capillary: 131 mg/dL — ABNORMAL HIGH (ref 70–99)
Glucose-Capillary: 148 mg/dL — ABNORMAL HIGH (ref 70–99)
Glucose-Capillary: 158 mg/dL — ABNORMAL HIGH (ref 70–99)
Glucose-Capillary: 158 mg/dL — ABNORMAL HIGH (ref 70–99)
Glucose-Capillary: 179 mg/dL — ABNORMAL HIGH (ref 70–99)

## 2023-04-12 LAB — BASIC METABOLIC PANEL
Anion gap: 6 (ref 5–15)
BUN: 24 mg/dL — ABNORMAL HIGH (ref 8–23)
CO2: 28 mmol/L (ref 22–32)
Calcium: 8.9 mg/dL (ref 8.9–10.3)
Chloride: 109 mmol/L (ref 98–111)
Creatinine, Ser: 1.83 mg/dL — ABNORMAL HIGH (ref 0.61–1.24)
GFR, Estimated: 40 mL/min — ABNORMAL LOW (ref >60)
Glucose, Bld: 151 mg/dL — ABNORMAL HIGH (ref 70–99)
Potassium: 4 mmol/L (ref 3.5–5.1)
Sodium: 143 mmol/L (ref 135–145)

## 2023-04-12 LAB — CBC
HCT: 35.4 % — ABNORMAL LOW (ref 39.0–52.0)
Hemoglobin: 11.3 g/dL — ABNORMAL LOW (ref 13.0–17.0)
MCH: 28.5 pg (ref 26.0–34.0)
MCHC: 31.9 g/dL (ref 30.0–36.0)
MCV: 89.2 fL (ref 80.0–100.0)
Platelets: 240 10*3/uL (ref 150–400)
RBC: 3.97 MIL/uL — ABNORMAL LOW (ref 4.22–5.81)
RDW: 15.5 % (ref 11.5–15.5)
WBC: 14 10*3/uL — ABNORMAL HIGH (ref 4.0–10.5)
nRBC: 0 % (ref 0.0–0.2)

## 2023-04-12 MED ORDER — LACTATED RINGERS IV SOLN: INTRAVENOUS | Status: DC

## 2023-04-12 MED ORDER — SODIUM CHLORIDE 0.9 % IV BOLUS
250.0000 mL | Freq: Once | INTRAVENOUS | Status: AC
  Administered 2023-04-12: 250 mL via INTRAVENOUS

## 2023-04-12 MED ORDER — HYDROCORTISONE SOD SUC (PF) 100 MG IJ SOLR
50.0000 mg | Freq: Three times a day (TID) | INTRAMUSCULAR | Status: DC
  Administered 2023-04-12 – 2023-04-15 (×9): 50 mg via INTRAVENOUS
  Filled 2023-04-12 (×12): qty 1

## 2023-04-12 NOTE — Plan of Care (Signed)

## 2023-04-12 NOTE — Progress Notes (Signed)
 [facility] Progress Note  2 Days Post-Op  Subjective: CC:  Sore but abdominal pain Improves w/ PCA. Denies flatus or BM. Walked in the hall this AM and is sitting in the chair. Abd binder in place. Wife and daughter at bedside. Per wife there was a full NG cannister that was changed this AM.  Objective: Vital signs in last 24 hours: Temp:  [98 F (36.7 C)-100 F (37.8 C)] 100 F (37.8 C) (01/11 0838) Pulse Rate:  [87-91] 90 (01/11 0838) Resp:  [15-18] 15 (01/11 0925) BP: (138-162)/(74-85) 162/85 (01/11 0838) SpO2:  [94 %-96 %] 95 % (01/11 0925)    Intake/Output from previous day: 01/10 0701 - 01/11 0700 In: 50 [P.O.:50] Out: 1098 [Urine:650; Emesis/NG output:388; Drains:60] Intake/Output this shift: Total I/O In: 258.3 [I.V.:8.3; IV Piggyback:250] Out: -   PE: Gen:  Alert, NAD, pleasant Card:  Regular rate and rhythm Pulm:  Normal effort ORA Abd: Soft, protuberant, appropriately tender without guarding, fascia in tact. No bleeding, no cellulitis. Drain cloudy SS   Skin: warm and dry,rash arms  Psych: A&Ox3   Lab Results:  Recent Labs    04/11/23 0631 04/12/23 0440  WBC 13.7* 14.0*  HGB 13.6 11.3*  HCT 42.5 35.4*  PLT 286 240   BMET Recent Labs    04/11/23 0631 04/12/23 0440  NA 141 143  K 4.6 4.0  CL 106 109  CO2 25 28  GLUCOSE 206* 151*  BUN 24* 24*  CREATININE 1.76* 1.83*  CALCIUM 9.0 8.9   PT/INR No results for input(s): LABPROT, INR in the last 72 hours. CMP     Component Value Date/Time   NA 143 04/12/2023 0440   K 4.0 04/12/2023 0440   CL 109 04/12/2023 0440   CO2 28 04/12/2023 0440   GLUCOSE 151 (H) 04/12/2023 0440   BUN 24 (H) 04/12/2023 0440   CREATININE 1.83 (H) 04/12/2023 0440   CALCIUM 8.9 04/12/2023 0440   PROT 6.9 04/10/2023 0629   ALBUMIN  4.0 04/10/2023 0629   AST 18 04/10/2023 0629   ALT 23 04/10/2023 0629   ALKPHOS 61 04/10/2023 0629   BILITOT 0.4 04/10/2023 0629   GFRNONAA 40 (L) 04/12/2023 0440    GFRAA  06/17/2007 2140    >60        The eGFR has been calculated using the MDRD equation. This calculation has not been validated in all clinical   Lipase     Component Value Date/Time   LIPASE 11 04/10/2023 0629       Studies/Results: DG Abd Portable 1 View Result Date: 04/10/2023 CLINICAL DATA:  68 year old male NG tube placement. EXAM: PORTABLE ABDOMEN - 1 VIEW COMPARISON:  CT Abdomen and Pelvis 0852 hours today. FINDINGS: Supine AP view at 1053 hours. Enteric tube tip has been placed into the stomach. The side hole is not apparent. Lung bases and bowel gas pattern appears stable, pneumoperitoneum more evident by CT. IMPRESSION: Enteric tube tip placed into the proximal stomach. Side hole not identified, consider advancement of several cm to ensure side hole placement within the stomach. Electronically Signed   By: VEAR Hurst M.D.   On: 04/10/2023 11:08    Anti-infectives: Anti-infectives (From admission, onward)    Start     Dose/Rate Route Frequency Ordered Stop   04/10/23 2200  piperacillin -tazobactam (ZOSYN ) IVPB 3.375 g        3.375 g 12.5 mL/hr over 240 Minutes Intravenous Every 8 hours 04/10/23 2101 04/17/23 2159   04/10/23 1000  piperacillin -tazobactam (ZOSYN ) IVPB 3.375 g        3.375 g 100 mL/hr over 30 Minutes Intravenous  Once 04/10/23 0948 04/10/23 1205        Assessment/Plan  Small bowel mass with perforation  POD#2 s/p exploratory laparotomy, small bowel resection 1/9 Dr. Polly -  AFVSS,  WBC 14 (stable, on steroids), hgb stable 11.3 from 13.5 -  await path -  Continue NG to LIWS and await bowel function, ok for limited ice chips and hard candy/gum for comfort  - moist-to-dry dressing midline wound, abd binder while up -  Cr 1.76 > 1.83 - avoid nephrotoxic agents, no toradol . Continue IVF, bolus per TRH. Appreciate their care. - DVT ppx with lovenox  -  continue Zosyn , 5 days  - continue PCA while NPO  Appreciate TR[facility] following and assisting with steroid  mgmt, AKI    LOS: 2 days   I reviewed nursing notes, hospitalist notes, last 24 h vitals and pain scores, last 48 h intake and output, last 24 h labs and trends, and last 24 h imaging results.    Almarie Pringle, PA-C [facility] Please see Amion for pager number during day hours 7:00am-4:30pm

## 2023-04-12 NOTE — Plan of Care (Signed)
  Problem: Coping: Goal: Ability to adjust to condition or change in health will improve Outcome: Progressing   Problem: Skin Integrity: Goal: Risk for impaired skin integrity will decrease Outcome: Progressing   Problem: Tissue Perfusion: Goal: Adequacy of tissue perfusion will improve Outcome: Progressing   Problem: Education: Goal: Knowledge of General Education information will improve Description: Including pain rating scale, medication(s)/side effects and non-pharmacologic comfort measures Outcome: Progressing   Problem: Health Behavior/Discharge Planning: Goal: Ability to manage health-related needs will improve Outcome: Progressing   Problem: Clinical Measurements: Goal: Ability to maintain clinical measurements within normal limits will improve Outcome: Progressing Goal: Will remain free from infection Outcome: Progressing Goal: Diagnostic test results will improve Outcome: Progressing   Problem: Activity: Goal: Risk for activity intolerance will decrease Outcome: Progressing   Problem: Elimination: Goal: Will not experience complications related to bowel motility Outcome: Progressing   Problem: Pain Management: Goal: General experience of comfort will improve Outcome: Progressing   Problem: Safety: Goal: Ability to remain free from injury will improve Outcome: Progressing

## 2023-04-12 NOTE — Progress Notes (Signed)
 PROGRESS NOTE   Elijah [name]  FMW:[id number]    DOB: 11-Jul-1955    DOA: 04/10/2023  PCP: Seabron Lenis, MD   I have briefly reviewed patients previous medical records in [facility].  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  CT abdomen concerning for small bowel mass with perforation.  Surgeons admitted the patient and he underwent exploratory laparotomy and small bowel resection 1/9.  TRH were consulted for assistance with management of PMR, chronic steroids and other medical problems.  Assessment & Plan:  Principal Problem:   Small bowel perforation (HCC)   Small bowel mass with perforation/peritonitis Management per primary service. Remains n.p.o., NG tube to low wall suction, IV fluids. No flatus or BM.  Likely to remain on bowel rest until bowel functions return. Postop ileus.  Management per CCS, they may consider TPN if ileus is prolonged.  Discussed with patient and family. Peritoneal fluid preliminary culture results showed rare Klebsiella pneumonia, sensitivities pending, continue IV Zosyn .  Polymyalgia rheumatica Per patient, diagnosed by PCP Has follow-up appointment with Dr. Maya Nash, rheumatology later this month. Was initially started on prednisone  40 mg daily in September 2024 and has been gradually tapered down to 22.5 mg daily. Postop initially placed on IV hydrocortisone  100 mg every 8 hours to cover for stressful situation.  Now approximately 36 hours of surgery, hemodynamically stable.  Home dose of prednisone  equates to 90 mg of hydrocortisone .  Will reduce IV hydrocortisone  to 50 mg  every 8 hours.  Eventually wean to home dose of prednisone  when able to tolerate p.o.   GERD Added IV PPI   Newly diagnosed type II DM with hyperglycemia Patient denies history of prediabetes or diabetes A1c 7.5.  Could also be steroid induced DM. For now continue NovoLog  SSI and adjust insulins as needed.  If CBGs consistently greater than 200, may consider long-acting insulin .  Fair control on current regimen. Diabetes coordinator consultation. When able to take p.o., could consider transitioning to metformin  at time of discharge.   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  Most recent creatinine in 2009 was in the 1.2-1.5 range then no further results noted since then.   Avoid hypotension and nephrotoxic drugs. Creatinine has gradually crept up.  To 1.76 followed by 1.83. AKI may be related to CT contrast he got on admission.  No hypotensive episodes noted since admission and does not appear volume depleted Check postvoid residual, continue IV fluids at 125 mL/h (clinically appears euvolemic), no mention of hydronephrosis on admission CT abdomen.  Avoid hypotension and nephrotoxic medications.  Will give her to 50 mL IV fluid bolus.  NG tube output 388 mL yesterday, urinating well, urine output not fully documented. Strict intake and output.  Daily weights. Trend daily BMP and if does not improve or worsens, consider nephrology consultation.   OSA on nightly CPAP Hold off on CPAP while he has an NG tube CCS team to advise when okay to resume CPAP.   BPH: Resume Proscar  when able to take p.o.   Asthma Patient denies history of this. As needed albuterol  nebs.  Hypoxia As per PACU nurse, oxygen saturations in the 87-88% on room air, likely due to anesthetic effect and splinting of diaphragm from abdominal pain/recent surgery Acute respiratory failure with hypoxia ruled out Incentive spirometry (counseled patient and family extensively to make  sure to use), titrate oxygen to maintain saturations >92% and wean off as tolerated.  Early mobilization. Remains on 2 L/min [place] oxygen but suspect he can be weaned off to room air.   Dilutional anemia Hemoglobin down to 11.  EBL was 30 mL Follow CBC in AM.  Body mass index is 31 kg/m./Obesity Outpatient follow-up.   DVT prophylaxis: enoxaparin  (LOVENOX ) injection 40 mg Start: 04/11/23 1000 SCDs Start: 04/10/23 2101     Code Status: Full Code:  Family Communication: Spouse and daughter at bedside.  She is a retired Scientist, Forensic. Disposition:  Status is: Inpatient Remains inpatient appropriate because: Immediate postop     Consultants:   TRH are consultants.  Procedures:   Exploratory laparotomy and small bowel resection 1/9  Antimicrobials:   IV Zosyn    Subjective:  Foley catheter out.  Has been up to urinate multiple times with good urine output.  Feels rumbling in the stomach but no flatus or BM yet.  No nausea or vomiting.  No significant abdominal pain.  Objective:   Vitals:   04/11/23 2101 04/11/23 2359 04/12/23 0430 04/12/23 0526  BP: 138/74   (!) 154/81  Pulse: 87   87  Resp: 18 18 18 16   Temp: 98 F (36.7 C)   98.4 F (36.9 C)  TempSrc: Oral   Oral  SpO2: 96% 96% 96% 95%  Weight:      Height:        General exam: Middle-age male, moderately built and obese lying comfortably propped up in bed without distress. ENT: Has right nostril NG tube to low wall suction. Respiratory system: Clear to auscultation.  No increased work of breathing. Cardiovascular system: S1 & S2 heard, RRR. No JVD, murmurs, rubs, gallops or clicks. No pedal edema. Gastrointestinal system: Abdomen is mildly distended, postop dressing C/D/I with a spot of dried blood at the epigastric area.  Appropriate minimal diffuse tenderness without peritoneal signs.  No bowel sounds appreciated during my exam. Central nervous system: Alert and oriented. No focal neurological deficits. Extremities:  Symmetric 5 x 5 power. Skin: Extensive rash over right upper anterior chest and right upper extremity reported to be hemangioma per patient.  Chronic. Psychiatry: Judgement and insight appear normal. Mood & affect appropriate.     Data Reviewed:   I have personally reviewed following labs and imaging studies   CBC: Recent Labs  Lab 04/10/23 0629 04/10/23 2257 04/11/23 0631 04/12/23 0440  WBC 15.1* 14.3* 13.7* 14.0*  NEUTROABS 11.5*  --   --   --   HGB 13.5 14.1 13.6 11.3*  HCT 42.1 43.0 42.5 35.4*  MCV 87.5 86.9 87.1 89.2  PLT 379 296 286 240    Basic Metabolic Panel: Recent Labs  Lab 04/10/23 0629 04/10/23 2257 04/11/23 0631 04/12/23 0440  NA 141  --  141 143  K 3.8  --  4.6 4.0  CL 102  --  106 109  CO2 24  --  25 28  GLUCOSE 200*  --  206* 151*  BUN 28*  --  24* 24*  CREATININE 1.58* 1.62* 1.76* 1.83*  CALCIUM 9.6  --  9.0 8.9    Liver Function Tests: Recent Labs  Lab 04/10/23 0629  AST 18  ALT  23  ALKPHOS 61  BILITOT 0.4  PROT 6.9  ALBUMIN  4.0    CBG: Recent Labs  Lab 04/11/23 2105 04/11/23 2328 04/12/23 0523  GLUCAP 140* 173* 131*    Microbiology Studies:   Recent Results (from the past 240 hours)  Aerobic/Anaerobic Culture w Gram Stain (surgical/deep wound)     Status: None (Preliminary result)   Collection Time: 04/10/23  2:15 PM   Specimen: Path fluid; Body Fluid  Result Value Ref Range Status   Specimen Description PERITONEAL  Final   Special Requests NONE  Final   Gram Stain   Final    ABUNDANT WBC PRESENT, PREDOMINANTLY PMN RARE GRAM POSITIVE RODS CRITICAL RESULT CALLED TO, READ BACK BY AND VERIFIED WITH: MD MEZZER ON 04/10/23 @ 1537 BY DRT    Culture   Final    RARE KLEBSIELLA PNEUMONIAE SUSCEPTIBILITIES TO FOLLOW Performed at [facility], [address] [place], [place] [address]    Report Status PENDING  Incomplete    Radiology Studies:  DG Abd Portable 1 View Result Date: 04/10/2023 CLINICAL DATA:  68 year old  male NG tube placement. EXAM: PORTABLE ABDOMEN - 1 VIEW COMPARISON:  CT Abdomen and Pelvis 0852 hours today. FINDINGS: Supine AP view at 1053 hours. Enteric tube tip has been placed into the stomach. The side hole is not apparent. Lung bases and bowel gas pattern appears stable, pneumoperitoneum more evident by CT. IMPRESSION: Enteric tube tip placed into the proximal stomach. Side hole not identified, consider advancement of several cm to ensure side hole placement within the stomach. Electronically Signed   By: VEAR Hurst M.D.   On: 04/10/2023 11:08   CT ABDOMEN PELVIS W CONTRAST Result Date: 04/10/2023 CLINICAL DATA:  Left lower quadrant abdominal pain.  Dry heaves. EXAM: CT ABDOMEN AND PELVIS WITH CONTRAST TECHNIQUE: Multidetector CT imaging of the abdomen and pelvis was performed using the standard protocol following bolus administration of intravenous contrast. RADIATION DOSE REDUCTION: This exam was performed according to the departmental dose-optimization program which includes automated exposure control, adjustment of the mA and/or kV according to patient size and/or use of iterative reconstruction technique. CONTRAST:  OMNIPAQUE  IOHEXOL  300 MG/ML  SOLN COMPARISON:  12/07/2019 FINDINGS: Lower chest: Dependent atelectasis. Hepatobiliary: No suspicious focal abnormality within the liver parenchyma. There is no evidence for gallstones, gallbladder wall thickening, or pericholecystic fluid. No intrahepatic or extrahepatic biliary dilation. Pancreas: No focal mass lesion. No dilatation of the main duct. No intraparenchymal cyst. No peripancreatic edema. Spleen: No splenomegaly. No suspicious focal mass lesion. Adrenals/Urinary Tract: No adrenal nodule or mass. Small bilateral renal cysts noted. No followup imaging is recommended. 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 No followup imaging is recommended. No evidence for hydroureter. Mild circumferential bladder wall thickening  likely accentuated by underdistention. Stomach/Bowel: Stomach is unremarkable. No gastric wall thickening. No evidence of outlet obstruction. Duodenum is normally positioned as is the ligament of Treitz. 13.9 x 9.9 x 12.4 cm mass lesion is identified in the left abdomen. This lesion contains central fluid, debris and gas and is contiguous with the lumen of the small bowel. The terminal ileum is normal. The appendix is normal. Colon is nondilated. There is some mild wall thickening in the descending colon is a tracks along the small bowel mass, likely secondary. Vascular/Lymphatic: There is mild atherosclerotic calcification of the abdominal aorta without aneurysm. There is no gastrohepatic or hepatoduodenal ligament lymphadenopathy. No retroperitoneal or mesenteric lymphadenopathy. No pelvic sidewall lymphadenopathy. Reproductive:  Prostate gland is mildly enlarged. Other: Small volume ascites. Moderate volume intraperitoneal free gas. Musculoskeletal: No worrisome lytic or sclerotic osseous abnormality. IMPRESSION: 1. 13.9 x 9.9 x 12.4 cm mass lesion in the left abdomen containing central fluid, debris and gas and is contiguous with the lumen of the small bowel. 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 The presence of associated intraperitoneal free gas is consistent with perforation of the lesion/involved small bowel segment. 2. Small volume ascites. 3. Mild wall thickening in the descending colon is a tracks along the small bowel mass, likely secondary. 4.  Aortic Atherosclerosis (ICD10-I70.0). Critical Value/emergent results were called by telephone at the time of interpretation on 04/10/2023 at 9:38 am to provider Dr. Ellouise, who verbally acknowledged these results. Electronically Signed   By: Camellia Candle M.D.   On: 04/10/2023 09:38    Scheduled Meds:    enoxaparin  (LOVENOX ) injection  40 mg  Subcutaneous Q24H   hydrocortisone  sodium succinate   100 mg Intravenous Q8H   HYDROmorphone    Intravenous Q4H   insulin  aspart  0-9 Units Subcutaneous Q4H   methocarbamol  (ROBAXIN ) injection  500 mg Intravenous Q8H   pantoprazole  (PROTONIX ) IV  40 mg Intravenous Q24H    Continuous Infusions:    lactated ringers  125 mL/hr at 04/11/23 9095   piperacillin -tazobactam (ZOSYN )  IV 3.375 g (04/12/23 0518)     LOS: 2 days     Trenda Mar, MD,  FACP, [facility], [facility], [facility]   [facility] [place]      To contact the attending provider between 7A-7P or the covering provider during after hours 7P-7A, please log into the web site [email or web address] and access using universal [place] password for that web site. If you do not have the password, please call the hospital operator.  04/12/2023, 8:04 AM

## 2023-04-13 LAB — BASIC METABOLIC PANEL
Anion gap: 6 (ref 5–15)
BUN: 21 mg/dL (ref 8–23)
CO2: 30 mmol/L (ref 22–32)
Calcium: 8.9 mg/dL (ref 8.9–10.3)
Chloride: 110 mmol/L (ref 98–111)
Creatinine, Ser: 1.5 mg/dL — ABNORMAL HIGH (ref 0.61–1.24)
GFR, Estimated: 51 mL/min — ABNORMAL LOW (ref >60)
Glucose, Bld: 143 mg/dL — ABNORMAL HIGH (ref 70–99)
Potassium: 3.7 mmol/L (ref 3.5–5.1)
Sodium: 146 mmol/L — ABNORMAL HIGH (ref 135–145)

## 2023-04-13 LAB — GLUCOSE, CAPILLARY
Glucose-Capillary: 145 mg/dL — ABNORMAL HIGH (ref 70–99)
Glucose-Capillary: 125 mg/dL — ABNORMAL HIGH (ref 70–99)
Glucose-Capillary: 150 mg/dL — ABNORMAL HIGH (ref 70–99)
Glucose-Capillary: 138 mg/dL — ABNORMAL HIGH (ref 70–99)
Glucose-Capillary: 147 mg/dL — ABNORMAL HIGH (ref 70–99)
Glucose-Capillary: 126 mg/dL — ABNORMAL HIGH (ref 70–99)

## 2023-04-13 LAB — CBC
HCT: 34.3 % — ABNORMAL LOW (ref 39.0–52.0)
Hemoglobin: 10.8 g/dL — ABNORMAL LOW (ref 13.0–17.0)
MCH: 28 pg (ref 26.0–34.0)
MCHC: 31.5 g/dL (ref 30.0–36.0)
MCV: 88.9 fL (ref 80.0–100.0)
Platelets: 239 10*3/uL (ref 150–400)
RBC: 3.86 MIL/uL — ABNORMAL LOW (ref 4.22–5.81)
RDW: 15.4 % (ref 11.5–15.5)
WBC: 13.5 10*3/uL — ABNORMAL HIGH (ref 4.0–10.5)
nRBC: 0 % (ref 0.0–0.2)

## 2023-04-13 MED ORDER — SODIUM CHLORIDE 0.45 % IV SOLN
INTRAVENOUS | Status: DC
Start: 2023-04-13 — End: 2023-04-14

## 2023-04-13 NOTE — Progress Notes (Addendum)
   04/13/23 0925  Mobility  Activity Ambulated independently in hallway  Level of Assistance Independent after set-up  Assistive Device Other (Comment) (IV Pole)  Distance Ambulated (ft) 550 ft  Activity Response Tolerated fair  Mobility Referral Yes  Mobility visit 1 Mobility  Mobility Specialist Start Time (ACUTE ONLY) [id number]  Mobility Specialist Stop Time (ACUTE ONLY) 0925  Mobility Specialist Time Calculation (min) (ACUTE ONLY) 22 min   Mobility Specialist: Progress Note  Pt agreeable to mobility session - received in bed. Required Ind using IV Pole. C/o tired d/t meds. Returned to chair with all needs met - call bell within reach. Wife present. Pt has been walking with wife at least 2-3x a day with wife.   Virgle Boards, BS Mobility Specialist Please contact via SecureChat or Rehab office at [phone].

## 2023-04-13 NOTE — Progress Notes (Addendum)
 PROGRESS NOTE   Elijah [name]  FMW:[id number]    DOB: 25-Sep-1955    DOA: 04/10/2023  PCP: Seabron Lenis, MD   I have briefly reviewed patients previous medical records in [facility].  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  CT abdomen concerning for small bowel mass with perforation.  Surgeons admitted the patient and he underwent exploratory laparotomy and small bowel resection 1/9.  TRH were consulted for assistance with management of PMR, chronic steroids and other medical problems.  Postop course complicated by expected postop ileus, acute on chronic kidney disease.  Improving.  Assessment & Plan:  Principal Problem:   Small bowel perforation (HCC)   Small bowel mass with perforation/Klebsiella pneumoniae peritonitis Management per primary service. Remains n.p.o., NG tube to low wall suction, IV fluids. Peritoneal fluid showed rare Klebsiella pneumonia, sensitivities to all antibiotics tested except ampicillin.  Continue IV Zosyn ,'s can be switched to oral antibiotics at time of discharge.  Duration deferred to primary team. Postop ileus, expected, improving.  Passing flatus.  Plans to clamp NG tube today.  Polymyalgia rheumatica Per patient, diagnosed by PCP Has follow-up appointment with Dr. Maya Nash, rheumatology later this month. Was initially started on prednisone  40 mg daily in September 2024 and has been gradually tapered down to 22.5 mg daily. Postop initially placed on IV hydrocortisone  100 mg every 8 hours to cover for stressful situation.  Now approximately 36 hours of surgery,  hemodynamically stable.  Home dose of prednisone  equates to 90 mg of hydrocortisone .  Will reduce IV hydrocortisone  to 50 mg every 8 hours.  Eventually wean to home dose of prednisone  when able to tolerate p.o. Mild hypertension but otherwise stable.   GERD Continue IV PPI   Newly diagnosed type II DM with hyperglycemia Patient denies history of prediabetes or diabetes A1c 7.5.  Could also be steroid induced DM. For now continue NovoLog  SSI and adjust insulins as needed.  If CBGs consistently greater than 200, may consider long-acting insulin .  Good inpatient control. Diabetes coordinator consultation. When able to take p.o., could consider transitioning to metformin  at time of discharge.   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  Most recent creatinine in 2009 was in the 1.2-1.5 range then no further results noted since then.   Avoid hypotension and nephrotoxic drugs. Creatinine has gradually crept up.  To 1.76 followed by 1.83. AKI likely multifactorial due to CT contrast on admit, GI losses due to postop ileus. No mention of hydronephrosis on admission CT abdomen.   Avoid hypotension and nephrotoxic medications.  Urinating well. NGT output 850 mL documented for 1/11.  Based on recording, +1.1 L thus far. While n.p.o., continue IV fluids.  However after the current NS bag is completed, consider changing to half-normal saline due to mild hypernatremia/146. Creatinine has improved to 1.5.  Continue to trend BMP.   OSA on nightly CPAP Hold off on CPAP while he has an NG tube CCS team to advise when okay to resume CPAP.   BPH: Resume Proscar  when able to take p.o.   Asthma Patient denies history of this.  As needed albuterol  nebs.   Hypoxia As per PACU nurse, oxygen saturations in the 87-88% on room air, likely due to anesthetic effect and splinting of diaphragm from abdominal pain/recent surgery Acute respiratory failure with hypoxia ruled  out Incentive spirometry (counseled patient and family extensively to make sure to use), titrate oxygen to maintain saturations >92% and wean off as tolerated.  Early mobilization. Although 2 L/min [place] oxygen is listed under flowsheets, patient is currently on room air and not hypoxic.  Dilutional anemia Hemoglobin down to 11 >10.8.  EBL was 30 mL Follow CBC in AM.  Body mass index is 32.61 kg/m./Obesity Outpatient follow-up.   DVT prophylaxis: enoxaparin  (LOVENOX ) injection 40 mg Start: 04/11/23 1000 SCDs Start: 04/10/23 2101     Code Status: Full Code:  Family Communication: Spouse at bedside.  She is a retired Scientist, Forensic. Disposition:  Status is: Inpatient Remains inpatient appropriate because: Immediate postop     Consultants:   TRH are consultants.  Procedures:   Exploratory laparotomy and small bowel resection 1/9  Antimicrobials:   IV Zosyn    Subjective:  Doing better.  Passing some flatus.  No BM.  No abdominal pain reported.  Denies dyspnea or chest pain.  States that he ambulated half the floor labs x 3 yesterday and a full lab today.  Using incentive spirometry.  Objective:   Vitals:   04/13/23 0401 04/13/23 0500 04/13/23 0731 04/13/23 0824  BP:    (!) 160/96  Pulse:    79  Resp: 18  17 18   Temp:    98.3 F (36.8 C)  TempSrc:    Oral  SpO2:    94%  Weight:  112.1 kg    Height:        General exam: Middle-age male, moderately built and obese lying comfortably in reclining chair without distress. ENT: Has right nostril NG tube to low wall suction. Respiratory system: Clear to auscultation.  No increased work of breathing.. Cardiovascular system: S1 & S2 heard, RRR. No JVD, murmurs, rubs, gallops or clicks. No pedal edema. Gastrointestinal system: Abdomen is mildly distended, soft and nontender.  Has abdominal binder on.  Bowel sounds heard. Central nervous system: Alert and oriented. No focal neurological deficits. Extremities: Symmetric 5 x 5  power. Skin: Extensive rash over right upper anterior chest and right upper extremity reported to be hemangioma per patient.  Chronic. Psychiatry: Judgement and insight appear normal. Mood & affect appropriate.     Data Reviewed:   I have personally reviewed following labs and imaging studies   CBC: Recent Labs  Lab 04/10/23 0629 04/10/23 2257 04/11/23 0631 04/12/23 0440 04/13/23 0547  WBC 15.1*   < > 13.7* 14.0* 13.5*  NEUTROABS 11.5*  --   --   --   --   HGB 13.5   < > 13.6 11.3* 10.8*  HCT 42.1   < > 42.5 35.4* 34.3*  MCV 87.5   < > 87.1 89.2 88.9  PLT 379   < > 286 240 239   < > = values in this interval not displayed.    Basic Metabolic Panel: Recent Labs  Lab 04/10/23 0629 04/10/23 2257 04/11/23 0631 04/12/23 0440 04/13/23 0547  NA 141  --  141 143 146*  K 3.8  --  4.6 4.0 3.7  CL 102  --  106 109 110  CO2 24  --  25 28 30   GLUCOSE 200*  --  206* 151* 143*  BUN 28*  --  24* 24* 21  CREATININE 1.58* 1.62* 1.76* 1.83* 1.50*  CALCIUM 9.6  --  9.0 8.9 8.9    Liver Function Tests: Recent Labs  Lab 04/10/23 0629  AST 18  ALT 23  ALKPHOS 61  BILITOT 0.4  PROT 6.9  ALBUMIN  4.0    CBG: Recent Labs  Lab 04/12/23 1955 04/12/23 2337 04/13/23 0354  GLUCAP 111* 158* 145*    Microbiology Studies:   Recent Results (from the past 240 hours)  Aerobic/Anaerobic Culture w Gram Stain (surgical/deep wound)     Status: None (Preliminary result)   Collection Time: 04/10/23  2:15 PM   Specimen: Path fluid; Body Fluid  Result Value Ref Range Status   Specimen Description PERITONEAL  Final   Special Requests NONE  Final   Gram Stain   Final    ABUNDANT WBC PRESENT, PREDOMINANTLY PMN RARE GRAM POSITIVE RODS CRITICAL RESULT CALLED TO, READ BACK BY AND VERIFIED WITH: MD MEZZER ON 04/10/23 @ 1537 BY DRT Performed at [facility], [address] [place], [place] [address]    Culture   Final    RARE KLEBSIELLA PNEUMONIAE FEW LACTOBACILLUS  SPECIES Standardized susceptibility testing for this organism is not available. NO ANAEROBES ISOLATED; CULTURE IN PROGRESS FOR 5 DAYS    Report Status PENDING  Incomplete   Organism ID, Bacteria KLEBSIELLA PNEUMONIAE  Final      Susceptibility   Klebsiella pneumoniae - MIC*    AMPICILLIN >=32 RESISTANT Resistant     CEFEPIME  <=0.12 SENSITIVE Sensitive     CEFTAZIDIME <=1 SENSITIVE Sensitive     CEFTRIAXONE  <=0.25 SENSITIVE Sensitive     CIPROFLOXACIN <=0.25 SENSITIVE Sensitive     GENTAMICIN  <=1 SENSITIVE Sensitive     IMIPENEM <=0.25 SENSITIVE Sensitive     TRIMETH /SULFA  <=20 SENSITIVE Sensitive     AMPICILLIN/SULBACTAM 4 SENSITIVE Sensitive     PIP/TAZO <=4 SENSITIVE Sensitive ug/mL    * RARE KLEBSIELLA PNEUMONIAE    Radiology Studies:  No results found.   Scheduled Meds:    enoxaparin  (LOVENOX ) injection  40 mg Subcutaneous Q24H   hydrocortisone  sodium succinate   50 mg Intravenous Q8H   HYDROmorphone    Intravenous Q4H   insulin  aspart  0-9 Units Subcutaneous Q4H   methocarbamol  (ROBAXIN ) injection  500 mg Intravenous Q8H   pantoprazole  (PROTONIX ) IV  40 mg Intravenous Q24H    Continuous Infusions:    piperacillin -tazobactam (ZOSYN )  IV 12.5 mL/hr at 04/13/23 0631     LOS: 3 days     Trenda Mar, MD,  FACP, [facility], [facility], [facility]   [facility] [place]      To contact the attending provider between 7A-7P or the covering provider during after hours 7P-7A, please log into the web site [email or web address] and access using universal [place] password for that web site. If you do not have the password, please call the hospital operator.  04/13/2023, 10:33 AM

## 2023-04-13 NOTE — Plan of Care (Signed)

## 2023-04-14 DIAGNOSIS — E876 Hypokalemia: Secondary | ICD-10-CM

## 2023-04-14 DIAGNOSIS — K9189 Other postprocedural complications and disorders of digestive system: Secondary | ICD-10-CM

## 2023-04-14 DIAGNOSIS — K567 Ileus, unspecified: Secondary | ICD-10-CM

## 2023-04-14 LAB — CBC
HCT: 32.6 % — ABNORMAL LOW (ref 39.0–52.0)
Hemoglobin: 10.3 g/dL — ABNORMAL LOW (ref 13.0–17.0)
MCH: 28 pg (ref 26.0–34.0)
MCHC: 31.6 g/dL (ref 30.0–36.0)
MCV: 88.6 fL (ref 80.0–100.0)
Platelets: 255 10*3/uL (ref 150–400)
RBC: 3.68 MIL/uL — ABNORMAL LOW (ref 4.22–5.81)
RDW: 15.2 % (ref 11.5–15.5)
WBC: 12.8 10*3/uL — ABNORMAL HIGH (ref 4.0–10.5)
nRBC: 0 % (ref 0.0–0.2)

## 2023-04-14 LAB — BASIC METABOLIC PANEL
Anion gap: 15 (ref 5–15)
BUN: 18 mg/dL (ref 8–23)
CO2: 24 mmol/L (ref 22–32)
Calcium: 8.5 mg/dL — ABNORMAL LOW (ref 8.9–10.3)
Chloride: 103 mmol/L (ref 98–111)
Creatinine, Ser: 1.43 mg/dL — ABNORMAL HIGH (ref 0.61–1.24)
GFR, Estimated: 54 mL/min — ABNORMAL LOW (ref >60)
Glucose, Bld: 151 mg/dL — ABNORMAL HIGH (ref 70–99)
Potassium: 3.2 mmol/L — ABNORMAL LOW (ref 3.5–5.1)
Sodium: 142 mmol/L (ref 135–145)

## 2023-04-14 LAB — GLUCOSE, CAPILLARY
Glucose-Capillary: 150 mg/dL — ABNORMAL HIGH (ref 70–99)
Glucose-Capillary: 128 mg/dL — ABNORMAL HIGH (ref 70–99)
Glucose-Capillary: 142 mg/dL — ABNORMAL HIGH (ref 70–99)
Glucose-Capillary: 186 mg/dL — ABNORMAL HIGH (ref 70–99)
Glucose-Capillary: 169 mg/dL — ABNORMAL HIGH (ref 70–99)

## 2023-04-14 LAB — MAGNESIUM: Magnesium: 2.1 mg/dL (ref 1.7–2.4)

## 2023-04-14 MED ORDER — POTASSIUM CHLORIDE 10 MEQ/100ML IV SOLN
INTRAVENOUS | Status: AC
  Filled 2023-04-14: qty 100

## 2023-04-14 MED ORDER — METHOCARBAMOL 500 MG PO TABS
1000.0000 mg | ORAL_TABLET | Freq: Three times a day (TID) | ORAL | Status: DC
  Administered 2023-04-14 – 2023-04-17 (×10): 1000 mg via ORAL
  Filled 2023-04-14 (×10): qty 2

## 2023-04-14 MED ORDER — STERILE WATER FOR INJECTION IJ SOLN
INTRAMUSCULAR | Status: AC
  Administered 2023-04-14: 10 mL
  Filled 2023-04-14: qty 10

## 2023-04-14 MED ORDER — HYDROMORPHONE HCL 1 MG/ML IJ SOLN
0.5000 mg | INTRAMUSCULAR | Status: DC | PRN
  Administered 2023-04-14 – 2023-04-17 (×7): 0.5 mg via INTRAVENOUS
  Filled 2023-04-14 (×8): qty 0.5

## 2023-04-14 MED ORDER — OXYCODONE HCL 5 MG PO TABS
5.0000 mg | ORAL_TABLET | ORAL | Status: DC | PRN
Start: 2023-04-14 — End: 2023-04-17
  Administered 2023-04-14 – 2023-04-17 (×9): 10 mg via ORAL
  Filled 2023-04-14 (×9): qty 2

## 2023-04-14 MED ORDER — SODIUM CHLORIDE 0.45 % IV SOLN: INTRAVENOUS | Status: DC

## 2023-04-14 MED ORDER — ORAL CARE MOUTH RINSE: 15.0000 mL | OROMUCOSAL | Status: DC | PRN

## 2023-04-14 MED ORDER — POTASSIUM CHLORIDE 10 MEQ/100ML IV SOLN
10.0000 meq | INTRAVENOUS | Status: AC
  Administered 2023-04-14 (×4): 10 meq via INTRAVENOUS
  Filled 2023-04-14 (×3): qty 100

## 2023-04-14 MED ORDER — BOOST / RESOURCE BREEZE PO LIQD CUSTOM
1.0000 | Freq: Three times a day (TID) | ORAL | Status: DC
  Administered 2023-04-14 – 2023-04-17 (×8): 1 via ORAL

## 2023-04-14 MED ORDER — ACETAMINOPHEN 325 MG PO TABS
650.0000 mg | ORAL_TABLET | Freq: Four times a day (QID) | ORAL | Status: DC
  Administered 2023-04-14 – 2023-04-17 (×12): 650 mg via ORAL
  Filled 2023-04-14 (×12): qty 2

## 2023-04-14 NOTE — TOC Initial Note (Addendum)
 Transition of Care (TOC) - Initial/Assessment Note   Spoke to patient and wife  at bedside.   Patient from home with wife.   CCS plans to start negative wound pressure system , today and patient will need one for home.   PA signed form for home Negative wound pressure system . NCM emailed form to [place] with [employer].   Confirmed face sheet information with patient and wife.   Cory with [facility] accepted referral for [facility] for negative wound pressure system dressing changes .   Explained to patient and wife , once home negative wound pressure system approved by insurance, NCM will bring home negative wound pressure system to room and discharging nurse will connect home negative wound pressure system to his dressing.   WOC to place negative wound pressure system, today , will need wound description and measurements.   Patient has medicare part A list which covers home health. However, Medicare part B covers DME ( negative wound pressure system ) . 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 Randine with [employer] will run insurance . Await determination .   Randine with [employer] , called they cannot find in medicare portable that patient has medicare part B. Randine asking if patient has any documentation from medicare B that it will be back dated to [place], 2024 or documentation on his [facility] plan. Asked patient. He has an email regarding  medicareB, once he finds email he will email to NCM , then NCM will forward to [place] . Randine aware. Patient has NCM email . Patient forwarded a voicemail to NCM , call from [place]  at Medicare/social security, patient 's medicare B is in progress . Patient will follow up.   For patient to get [facility] , will need [facility] information or see if he qualifies for charity VAC. Patient and daughter aware. Patient's wife will bring [facility] information when she visits  next  Patient Details  Name: Elijah [name] MRN: [id number] Date of Birth: Feb 21, 1956  Transition of Care [facility]) CM/SW Contact:    Stephane Powell Jansky, RN Phone Number: 04/14/2023, 12:04 PM  Clinical Narrative:                   Expected Discharge Plan: Home w Home Health Services Barriers to Discharge: Continued Medical Work up   Patient Goals and CMS Choice Patient states their goals for this hospitalization and ongoing recovery are:: to return to home CMS Medicare.gov Compare Post Acute Care list provided to:: Patient Choice offered to / list presented to : Patient      Expected Discharge Plan and Services   Discharge Planning Services: CM Consult Post Acute Care Choice: Home Health Living arrangements for the past 2 months: Single Family Home                 DME Arranged: Vac DME Agency: [facility]) Date DME Agency Contacted: 04/14/23 Time DME Agency Contacted: 1147 Representative spoke with at DME Agency: Dwayne CHEADLE Arranged: RN HH Agency: [facility] Date [facility] Agency Contacted: 04/14/23 Time HH Agency Contacted: 1148 Representative spoke with at [facility] Agency: Darleene  Prior Living Arrangements/Services Living arrangements for the past 2 months: Single Family Home Lives with:: Spouse Patient language and need for interpreter reviewed:: Yes Do you feel safe going back to the place where you live?: Yes  Need for Family Participation in Patient Care: Yes (Comment) Care giver support system in place?: Yes (comment)   Criminal Activity/Legal Involvement Pertinent to Current Situation/Hospitalization: No - Comment as needed  Activities of Daily Living   ADL Screening (condition at time of admission) Independently performs ADLs?: No Does the patient have a NEW difficulty with bathing/dressing/toileting/self-feeding that is expected to last >3 days?: Yes (Initiates electronic notice to provider for possible OT consult) Does the patient have a NEW  difficulty with getting in/out of bed, walking, or climbing stairs that is expected to last >3 days?: Yes (Initiates electronic notice to provider for possible PT consult) Does the patient have a NEW difficulty with communication that is expected to last >3 days?: No Is the patient deaf or have difficulty hearing?: No Does the patient have difficulty seeing, even when wearing glasses/contacts?: No Does the patient have difficulty concentrating, remembering, or making decisions?: No  Permission Sought/Granted   Permission granted to share information with : Yes, Verbal Permission Granted  Share Information with NAME: wife Darice  Permission granted to share info w AGENCY: [employer] , home health agencies        Emotional Assessment Appearance:: Appears stated age Attitude/Demeanor/Rapport: Engaged Affect (typically observed): Accepting Orientation: : Oriented to Self, Oriented to Place, Oriented to  Time, Oriented to Situation Alcohol / Substance Use: Not Applicable Psych Involvement: No (comment)  Admission diagnosis:  Perforation bowel (HCC) [K63.1] LLQ abdominal pain [R10.32] Small bowel mass [K63.89] Small bowel perforation (HCC) [K63.1] Patient Active Problem List   Diagnosis Date Noted   Small bowel perforation (HCC) 04/10/2023   Gastroesophageal reflux disease without esophagitis 04/09/2023   Allergic rhinitis due to allergen 04/09/2023   Severe major depression (HCC) 04/09/2023   Obstructive sleep apnea 04/09/2023   Daytime somnolence 04/09/2023   Exercise-induced asthma 04/09/2023   PCP:  Seabron Lenis, MD Pharmacy:   [facility] - [facility] [address] [place] [address] Phone: [phone] Fax: [phone]  [facility] [address] [place] [address] Phone: [phone] Fax: [phone]     Social Drivers of Health (SDOH) Social History: SDOH Screenings   Food Insecurity: No Food  Insecurity (04/10/2023)  Housing: Low Risk  (04/10/2023)  Transportation Needs: No Transportation Needs (04/10/2023)  Utilities: Not At Risk (04/10/2023)  Tobacco Use: Low Risk  (04/10/2023)   SDOH Interventions:     Readmission Risk Interventions     No data to display

## 2023-04-14 NOTE — Progress Notes (Signed)
 Progress Note  4 Days Post-Op  Subjective: Pt passing flatus and tolerating NGT clamped, has only had sips so far and ice chips. Binder present. Pain well controlled. JP low output overnight.   Objective: Vital signs in last 24 hours: Temp:  [98 F (36.7 C)-98.6 F (37 C)] 98.4 F (36.9 C) (01/13 0747) Pulse Rate:  [70-78] 70 (01/13 0747) Resp:  [15-18] 17 (01/13 0747) BP: (144-162)/(76-82) 144/81 (01/13 0747) SpO2:  [93 %-97 %] 95 % (01/13 0747) Weight:  [110.9 kg] 110.9 kg (01/13 0500)    Intake/Output from previous day: 01/12 0701 - 01/13 0700 In: 1669.1 [P.O.:100; I.V.:1361.3; NG/GT:50; IV Piggyback:157.8] Out: 50 [Emesis/NG output:50] Intake/Output this shift: No intake/output data recorded.  PE: General: pleasant, WD, WN male who is laying in bed in NAD Heart: regular, rate, and rhythm.  Lungs: Respiratory effort nonlabored Abd: soft, appropriately ttp, midline wound as below, JP with scant thin brown fluid   Psych: A&Ox3 with an appropriate affect.    Lab Results:  Recent Labs    04/13/23 0547 04/14/23 0455  WBC 13.5* 12.8*  HGB 10.8* 10.3*  HCT 34.3* 32.6*  PLT 239 255   BMET Recent Labs    04/13/23 0547 04/14/23 0455  NA 146* 142  K 3.7 3.2*  CL 110 103  CO2 30 24  GLUCOSE 143* 151*  BUN 21 18  CREATININE 1.50* 1.43*  CALCIUM 8.9 8.5*   PT/INR No results for input(s): LABPROT, INR in the last 72 hours. CMP     Component Value Date/Time   NA 142 04/14/2023 0455   K 3.2 (L) 04/14/2023 0455   CL 103 04/14/2023 0455   CO2 24 04/14/2023 0455   GLUCOSE 151 (H) 04/14/2023 0455   BUN 18 04/14/2023 0455   CREATININE 1.43 (H) 04/14/2023 0455   CALCIUM 8.5 (L) 04/14/2023 0455   PROT 6.9 04/10/2023 0629   ALBUMIN  4.0 04/10/2023 0629   AST 18 04/10/2023 0629   ALT 23 04/10/2023 0629   ALKPHOS 61 04/10/2023 0629   BILITOT 0.4 04/10/2023 0629   GFRNONAA 54 (L) 04/14/2023 0455   GFRAA  06/17/2007 2140    >60        The eGFR has been  calculated using the MDRD equation. This calculation has not been validated in all clinical   Lipase     Component Value Date/Time   LIPASE 11 04/10/2023 0629       Studies/Results: No results found.  Anti-infectives: Anti-infectives (From admission, onward)    Start     Dose/Rate Route Frequency Ordered Stop   04/10/23 2200  piperacillin -tazobactam (ZOSYN ) IVPB 3.375 g        3.375 g 12.5 mL/hr over 240 Minutes Intravenous Every 8 hours 04/10/23 2101 04/17/23 2159   04/10/23 1000  piperacillin -tazobactam (ZOSYN ) IVPB 3.375 g        3.375 g 100 mL/hr over 30 Minutes Intravenous  Once 04/10/23 0948 04/10/23 1205        Assessment/Plan   Small bowel mass with perforation  POD#5- exploratory laparotomy, small bowel resection 1/9 Dr. Polly -  AFVSS,  WBC 12.8 (downtrending), hgb stable 10.3 -  await path - prelim appears like lymphoma, awaiting stains  -  NGT clamped - trial CLD this AM and remove NGT this afternoon if tolerating - VAC to midline today  - Cr 1.43 - avoid nephrotoxic agents, stable  - Abdominal binder ordered for when OOB  FEN: CLD, NGT clamped  VTE: LMWH ID: Zosyn   1/9>1/13 stop after today   Polymyalgia Rheumatica - steroids per TRH, appreciate assistance  T2DM - SSI GERD Asthma BPH   LOS: 4 days      Burnard JONELLE Louder, [place] [place] [facility] 04/14/2023, 10:32 AM Please see Amion for pager number during day hours 7:00am-4:30pm

## 2023-04-14 NOTE — Progress Notes (Signed)
 Occupational Therapy Treatment Patient Details Name: Elijah [name] MRN: [id number] DOB: [birth year] Today's Date: 04/14/2023   History of present illness Patient is 67 y.o. male presents to [facility]10[facility]23 w/ small bowel mass w/ perforation. S/p ex-lap and small bowel resection 1/9. PMHx significant for polymyalgia rheumatica, BPH, GERD, DM (newly diagnosed), asthma.   OT comments  Patient demonstrating gains with OT treatment with bed mobility, ambulating to bathroom and self care tasks. Treatment limited due to arrival of PA who asked patient to return to supine to examine abdominal wound. Acute OT to continue to follow.       If plan is discharge home, recommend the following:  A little help with bathing/dressing/bathroom;Assistance with cooking/housework;Assist for transportation;Help with stairs or ramp for entrance   Equipment Recommendations  BSC/3in1    Recommendations for Other Services      Precautions / Restrictions Precautions Precautions: Other (comment);Fall (multiple lines) Required Braces or Orthoses: Other Brace Other Brace: abdominal binder Restrictions Weight Bearing Restrictions Per Provider Order: No Other Position/Activity Restrictions: watch drain and lines       Mobility Bed Mobility Overal bed mobility: Needs Assistance Bed Mobility: Rolling, Sidelying to Sit, Sit to Sidelying Rolling: Supervision Sidelying to sit: Min assist, HOB elevated, Used rails     Sit to sidelying: Min assist, HOB elevated, Used rails General bed mobility comments: increased time and cues for log rolling with assistance for trunk, returning to supine with assistance for BLEs    Transfers Overall transfer level: Needs assistance Equipment used: 1 person hand held assist Transfers: Sit to/from Stand, Bed to chair/wheelchair/BSC Sit to Stand: Contact guard assist           General transfer comment: CGA to stand from EOB, ambulated to bathroom and back to EOB     Balance  Overall balance assessment: Mild deficits observed, not formally tested                                         ADL either performed or assessed with clinical judgement   ADL Overall ADL's : Needs assistance/impaired Eating/Feeding: NPO   Grooming: Wash/dry hands;Contact guard assist;Standing Grooming Details (indicate cue type and reason): at sink                 Toilet Transfer: Contact guard Marine Scientist Details (indicate cue type and reason): stood at toilet to void                Extremity/Trunk Assessment              Vision       Perception     Praxis      Cognition Arousal: Alert Behavior During Therapy: WFL for tasks assessed/performed Overall Cognitive Status: Within Functional Limits for tasks assessed                                          Exercises      Shoulder Instructions       General Comments limited visit due to arrival of PA who asked patient to return to supine for examination    Pertinent Vitals/ Pain       Pain Assessment Pain Assessment: Faces Faces Pain Scale: Hurts little more Pain Location: abdomen Pain Descriptors / Indicators: Sore Pain  Intervention(s): Limited activity within patient's tolerance, Monitored during session, Repositioned  Home Living                                          Prior Functioning/Environment              Frequency  Min 1X/week        Progress Toward Goals  OT Goals(current goals can now be found in the care plan section)  Progress towards OT goals: Progressing toward goals  Acute Rehab OT Goals Patient Stated Goal: to get stronger OT Goal Formulation: With patient Time For Goal Achievement: 04/25/23 Potential to Achieve Goals: Good ADL Goals Pt Will Perform Grooming: with supervision;standing Pt Will Perform Upper Body Bathing: with modified independence Pt Will Perform Upper Body Dressing:  with modified independence Pt Will Perform Lower Body Dressing: with supervision;sit to/from stand Pt Will Perform Tub/Shower Transfer: with supervision;ambulating Additional ADL Goal #1: Pt will walk to bathroom and complete all toileting with mod I  Plan      Co-evaluation                 AM-PAC OT 6 Clicks Daily Activity     Outcome Measure   Help from another person eating meals?: Total Help from another person taking care of personal grooming?: A Little Help from another person toileting, which includes using toliet, bedpan, or urinal?: A Little Help from another person bathing (including washing, rinsing, drying)?: A Little Help from another person to put on and taking off regular upper body clothing?: A Little Help from another person to put on and taking off regular lower body clothing?: A Lot 6 Click Score: 15    End of Session    OT Visit Diagnosis: Unsteadiness on feet (R26.81)   Activity Tolerance Patient tolerated treatment well   Patient Left in bed;with call bell/phone within reach;Other (comment);with family/visitor present (PA present)   Nurse Communication Mobility status        Time: 1000-1015 OT Time Calculation (min): 15 min  Charges: OT General Charges $OT Visit: 1 Visit OT Treatments $Self Care/Home Management : 8-22 mins  Dick Laine, OTA Acute Rehabilitation Services  Office [phone]   Jeb LITTIE Laine 04/14/2023, 12:50 PM

## 2023-04-14 NOTE — Progress Notes (Signed)
 Mobility Specialist Progress Note:   04/14/23 1642  Mobility  Activity Ambulated with assistance in hallway  Level of Assistance Standby assist, set-up cues, supervision of patient - no hands on  Assistive Device Other (Comment) (IV Pole)  Distance Ambulated (ft) 550 ft  Activity Response Tolerated well  Mobility Referral Yes  Mobility visit 1 Mobility  Mobility Specialist Start Time (ACUTE ONLY) 1624  Mobility Specialist Stop Time (ACUTE ONLY) 1640  Mobility Specialist Time Calculation (min) (ACUTE ONLY) 16 min   Received pt in bed having no complaints and agreeable to mobility. Pt was asymptomatic throughout ambulation and returned to room w/o fault. Left in bed w/ call bell in reach and all needs met.   Thersia Minder Mobility Specialist  Please contact vis Secure Chat or  Rehab Office [phone]

## 2023-04-14 NOTE — Consult Note (Addendum)
 WOC Nurse Consult Note: Reason for Consult: Apply wound VAC on the abdomen Wound type: Surgical wound dehiscence. Pressure Injury POA: NA Measurement: 16x3.5x4cm Wound bed: 90% red, 10% yellow Drainage (amount, consistency, odor) medium amount, no odor. Periwound: intact  VAC: Removed old NPWT dressing Cleansed wound with normal saline Periwound skin protected with skin barrier wipe or window framed with drape Skin protected to the hip with VAC drape for foam bridge  Filled wound with 1 piece of black foam  Sealed NPWT dressing at HG/191mmHG  Patient did not received IV/PO pain medication per bedside nurse prior to dressing change. Assess if the pt needs on the next change. Patient tolerated procedure well  NPWT dressing Mon/Thurs  WOC nurse will continue to provide NPWT dressing changed.  Thank-you,  Lela Holm BSN, RN, [employer], WOC  (Pager: [phone])

## 2023-04-14 NOTE — Progress Notes (Signed)
 PROGRESS NOTE   Elijah [name]  FMW:[id number]    DOB: 1955-08-06    DOA: 04/10/2023  PCP: Seabron Lenis, MD   I have briefly reviewed patients previous medical records in [facility].  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  CT abdomen concerning for small bowel mass with perforation.  Surgeons admitted the patient and he underwent exploratory laparotomy and small bowel resection 1/9.  TRH were consulted for assistance with management of PMR, chronic steroids and other medical problems.  Postop course complicated by expected postop ileus, acute on chronic kidney disease.  Improving.  Assessment & Plan:  Principal Problem:   Small bowel perforation (HCC)   Small bowel mass with perforation/Klebsiella pneumoniae peritonitis Management per primary service. Remains n.p.o., NG tube to low wall suction, IV fluids. Peritoneal fluid showed rare Klebsiella pneumonia, sensitivities to all antibiotics tested except ampicillin.  Continue IV Zosyn ,'s can be switched to oral antibiotics at time of discharge.  Duration deferred to primary team. Postop ileus continues to improve.  NGT was clamped yesterday morning and only 50 mL output in last 24 hours.  Passing lots of gas but no BM yet. Expect surgical team to start advancing diet.  Mobilizing well.  Polymyalgia rheumatica Per patient, diagnosed by PCP Has follow-up appointment with Dr. Maya Nash, rheumatology later this month. Was initially started on prednisone  40 mg daily in September 2024 and has been gradually tapered down to 22.5 mg daily. Managed on IV  hydrocortisone  since surgery while still n.p.o.  Currently on 50 Mg 3 times daily.  Once able to tolerate p.o. adequately, plan to transition back to home dose of prednisone . Has remained hemodynamically stable.   GERD Continue IV PPI   Newly diagnosed type II DM with hyperglycemia Patient denies history of prediabetes or diabetes A1c 7.5.  Could also be steroid induced DM. For now continue NovoLog  SSI and adjust insulins as needed.  If CBGs consistently greater than 200, may consider long-acting insulin .  Good inpatient control. Diabetes coordinator consultation. When able to take p.o., could consider transitioning to metformin  at time of discharge.   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  Most recent creatinine in 2009 was in the 1.2-1.5 range then no further results noted since then.   Avoid hypotension and nephrotoxic drugs. AKI likely multifactorial due to CT contrast on admit, GI losses due to postop ileus. No mention of hydronephrosis on admission CT abdomen.   Urinating well. Creatinine had peaked to 1.83, IV hydration and creatinine down to 1.43.  Hypernatremia is also resolved. Continue half NS IVF hydration until adequate p.o. intake.  Continue to trend BMP.  Hypokalemia Replace IV and follow.  Magnesium normal.   OSA on nightly CPAP Hold off on CPAP while he has an NG tube Once NG tube has been removed, start CPAP nightly   BPH: Resume Proscar  when able to take p.o.   Asthma Patient denies history of this. As needed albuterol  nebs.   Hypoxia Hypoxic immediate postop while in PACU requiring O2 supplements Acute respiratory failure with hypoxia ruled out Incentive spirometry (counseled patient and  family extensively to make sure to use), titrate oxygen to maintain saturations >92% and wean off as tolerated.  Early mobilization. Hypoxia resolved.  Dilutional anemia Hemoglobin down to 11 >10.8 >10.3.  EBL was 30 mL Hemoglobin  stable.  Leukocytosis improving  Body mass index is 32.26 kg/m./Obesity Outpatient follow-up.   DVT prophylaxis: enoxaparin  (LOVENOX ) injection 40 mg Start: 04/11/23 1000 SCDs Start: 04/10/23 2101     Code Status: Full Code:  Family Communication: Spouse at bedside today.  She is a retired Scientist, Forensic. Disposition:  Resolving postop ileus.  Possible DC towards mid to end of this week.     Consultants:   TRH are consultants.  Procedures:   Exploratory laparotomy and small bowel resection 1/9  Antimicrobials:   IV Zosyn    Subjective:  No nausea or vomiting.  Passing lots of flatus.  No BM.  Continues to mobilize well.  Not much abdominal pain.  Objective:   Vitals:   04/14/23 0424 04/14/23 0500 04/14/23 0739 04/14/23 0747  BP:    (!) 144/81  Pulse:    70  Resp: 15  17 17   Temp:    98.4 F (36.9 C)  TempSrc:    Oral  SpO2:    95%  Weight:  110.9 kg    Height:        General exam: Middle-age male, moderately built and obese lying comfortably propped up in bed. ENT: Has right nostril NG tube to low wall suction.  Remains clamped. Respiratory system: Clear to auscultation.  No increased work of breathing.. Cardiovascular system: S1 & S2 heard, RRR. No JVD, murmurs, rubs, gallops or clicks. No pedal edema. Gastrointestinal system: Abdomen is mildly distended, soft and nontender.  Has abdominal binder on.  Bowel sounds present. Central nervous system: Alert and oriented. No focal neurological deficits. Extremities: Symmetric 5 x 5 power. Skin: Extensive rash over right upper anterior chest and right upper extremity reported to be hemangioma per patient.  Chronic. Psychiatry: Judgement and insight appear normal. Mood & affect appropriate.     Data Reviewed:   I have personally reviewed following labs and imaging studies   CBC: Recent Labs  Lab 04/10/23 0629 04/10/23 2257 04/12/23 0440 04/13/23 0547 04/14/23 0455  WBC 15.1*   < > 14.0* 13.5* 12.8*  NEUTROABS  11.5*  --   --   --   --   HGB 13.5   < > 11.3* 10.8* 10.3*  HCT 42.1   < > 35.4* 34.3* 32.6*  MCV 87.5   < > 89.2 88.9 88.6  PLT 379   < > 240 239 255   < > = values in this interval not displayed.    Basic Metabolic Panel: Recent Labs  Lab 04/10/23 0629 04/10/23 2257 04/11/23 0631 04/12/23 0440 04/13/23 0547 04/14/23 0455  NA 141  --  141 143 146* 142  K 3.8  --  4.6 4.0 3.7 3.2*  CL 102  --  106 109 110 103  CO2 24  --  25 28 30 24   GLUCOSE 200*  --  206* 151* 143* 151*  BUN 28*  --  24* 24* 21 18  CREATININE 1.58* 1.62* 1.76* 1.83* 1.50* 1.43*  CALCIUM 9.6  --  9.0 8.9 8.9 8.5*  MG  --   --   --   --   --  2.1    Liver Function Tests: Recent Labs  Lab 04/10/23 0629  AST 18  ALT 23  ALKPHOS 61  BILITOT  0.4  PROT 6.9  ALBUMIN  4.0    CBG: Recent Labs  Lab 04/13/23 2315 04/14/23 0339 04/14/23 0748  GLUCAP 126* 150* 128*    Microbiology Studies:   Recent Results (from the past 240 hours)  Aerobic/Anaerobic Culture w Gram Stain (surgical/deep wound)     Status: None (Preliminary result)   Collection Time: 04/10/23  2:15 PM   Specimen: Path fluid; Body Fluid  Result Value Ref Range Status   Specimen Description PERITONEAL  Final   Special Requests NONE  Final   Gram Stain   Final    ABUNDANT WBC PRESENT, PREDOMINANTLY PMN RARE GRAM POSITIVE RODS CRITICAL RESULT CALLED TO, READ BACK BY AND VERIFIED WITH: MD MEZZER ON 04/10/23 @ 1537 BY DRT Performed at [facility], [address] [place], [place] [address]    Culture   Final    RARE KLEBSIELLA PNEUMONIAE FEW LACTOBACILLUS SPECIES Standardized susceptibility testing for this organism is not available. NO ANAEROBES ISOLATED; CULTURE IN PROGRESS FOR 5 DAYS    Report Status PENDING  Incomplete   Organism ID, Bacteria KLEBSIELLA PNEUMONIAE  Final      Susceptibility   Klebsiella pneumoniae - MIC*    AMPICILLIN >=32 RESISTANT Resistant     CEFEPIME  <=0.12 SENSITIVE Sensitive     CEFTAZIDIME <=1  SENSITIVE Sensitive     CEFTRIAXONE  <=0.25 SENSITIVE Sensitive     CIPROFLOXACIN <=0.25 SENSITIVE Sensitive     GENTAMICIN  <=1 SENSITIVE Sensitive     IMIPENEM <=0.25 SENSITIVE Sensitive     TRIMETH /SULFA  <=20 SENSITIVE Sensitive     AMPICILLIN/SULBACTAM 4 SENSITIVE Sensitive     PIP/TAZO <=4 SENSITIVE Sensitive ug/mL    * RARE KLEBSIELLA PNEUMONIAE    Radiology Studies:  No results found.   Scheduled Meds:    enoxaparin  (LOVENOX ) injection  40 mg Subcutaneous Q24H   hydrocortisone  sodium succinate   50 mg Intravenous Q8H   HYDROmorphone    Intravenous Q4H   insulin  aspart  0-9 Units Subcutaneous Q4H   methocarbamol  (ROBAXIN ) injection  500 mg Intravenous Q8H   pantoprazole  (PROTONIX ) IV  40 mg Intravenous Q24H    Continuous Infusions:    sodium chloride  75 mL/hr at 04/14/23 0742   piperacillin -tazobactam (ZOSYN )  IV 12.5 mL/hr at 04/14/23 9361   potassium chloride  10 mEq (04/14/23 0843)   potassium chloride        LOS: 4 days     Trenda Mar, MD,  FACP, [facility], [facility], [facility]   [facility] [place]      To contact the attending provider between 7A-7P or the covering provider during after hours 7P-7A, please log into the web site [email or web address] and access using universal [place] password for that web site. If you do not have the password, please call the hospital operator.  04/14/2023, 8:44 AM

## 2023-04-14 NOTE — Plan of Care (Signed)
  Problem: Skin Integrity: Goal: Risk for impaired skin integrity will decrease Outcome: Progressing   Problem: Safety: Goal: Ability to remain free from injury will improve Outcome: Progressing   Problem: Skin Integrity: Goal: Risk for impaired skin integrity will decrease Outcome: Progressing

## 2023-04-14 NOTE — Plan of Care (Signed)
  Problem: Coping: Goal: Ability to adjust to condition or change in health will improve Outcome: Progressing   Problem: Fluid Volume: Goal: Ability to maintain a balanced intake and output will improve Outcome: Progressing   Problem: Health Behavior/Discharge Planning: Goal: Ability to manage health-related needs will improve Outcome: Progressing   Problem: Metabolic: Goal: Ability to maintain appropriate glucose levels will improve Outcome: Progressing   Problem: Nutritional: Goal: Maintenance of adequate nutrition will improve Outcome: Progressing Goal: Progress toward achieving an optimal weight will improve Outcome: Progressing   Problem: Skin Integrity: Goal: Risk for impaired skin integrity will decrease Outcome: Progressing   Problem: Tissue Perfusion: Goal: Adequacy of tissue perfusion will improve Outcome: Progressing   Problem: Education: Goal: Knowledge of General Education information will improve Description: Including pain rating scale, medication(s)/side effects and non-pharmacologic comfort measures Outcome: Progressing   Problem: Clinical Measurements: Goal: Ability to maintain clinical measurements within normal limits will improve Outcome: Progressing Goal: Will remain free from infection Outcome: Progressing Goal: Diagnostic test results will improve Outcome: Progressing Goal: Cardiovascular complication will be avoided Outcome: Progressing   Problem: Nutrition: Goal: Adequate nutrition will be maintained Outcome: Progressing   Problem: Elimination: Goal: Will not experience complications related to bowel motility Outcome: Progressing   Problem: Pain Management: Goal: General experience of comfort will improve Outcome: Progressing   Problem: Safety: Goal: Ability to remain free from injury will improve Outcome: Progressing

## 2023-04-14 NOTE — Progress Notes (Signed)
  Subjective *Late entry - patient was seen and evaluated by myself yesterday. No acute events. Feeling well. Wife, whom we know, Darice, is at bedside. He has reported flatus. No n/v. Pain well controlled. Up in chair.  Objective: Vital signs in last 24 hours: Temp:  [98 F (36.7 C)-98.6 F (37 C)] 98.4 F (36.9 C) (01/13 0747) Pulse Rate:  [70-78] 70 (01/13 0747) Resp:  [15-18] 17 (01/13 0747) BP: (144-162)/(76-82) 144/81 (01/13 0747) SpO2:  [93 %-97 %] 95 % (01/13 0747) Weight:  [110.9 kg] 110.9 kg (01/13 0500)    Intake/Output from previous day: 01/12 0701 - 01/13 0700 In: 1669.1 [P.O.:100; I.V.:1361.3; NG/GT:50; IV Piggyback:157.8] Out: 50 [Emesis/NG output:50] Intake/Output this shift: No intake/output data recorded.  Gen: NAD, comfortable CV: RRR Pulm: Normal work of breathing Abd: Soft, NT/ND. Incision clean, beginning to granulate. No evident fascial separation. Ext: SCDs in place  Lab Results: CBC  Recent Labs    04/13/23 0547 04/14/23 0455  WBC 13.5* 12.8*  HGB 10.8* 10.3*  HCT 34.3* 32.6*  PLT 239 255   BMET Recent Labs    04/13/23 0547 04/14/23 0455  NA 146* 142  K 3.7 3.2*  CL 110 103  CO2 30 24  GLUCOSE 143* 151*  BUN 21 18  CREATININE 1.50* 1.43*  CALCIUM 8.9 8.5*   PT/INR No results for input(s): LABPROT, INR in the last 72 hours. ABG No results for input(s): PHART, HCO3 in the last 72 hours.  Invalid input(s): PCO2, PO2  Studies/Results:  Anti-infectives: Anti-infectives (From admission, onward)    Start     Dose/Rate Route Frequency Ordered Stop   04/10/23 2200  piperacillin -tazobactam (ZOSYN ) IVPB 3.375 g        3.375 g 12.5 mL/hr over 240 Minutes Intravenous Every 8 hours 04/10/23 2101 04/17/23 2159   04/10/23 1000  piperacillin -tazobactam (ZOSYN ) IVPB 3.375 g        3.375 g 100 mL/hr over 30 Minutes Intravenous  Once 04/10/23 0948 04/10/23 1205        Assessment/Plan: Patient Active Problem List    Diagnosis Date Noted   Small bowel perforation (HCC) 04/10/2023   Gastroesophageal reflux disease without esophagitis 04/09/2023   Allergic rhinitis due to allergen 04/09/2023   Severe major depression (HCC) 04/09/2023   Obstructive sleep apnea 04/09/2023   Daytime somnolence 04/09/2023   Exercise-induced asthma 04/09/2023   s/p Procedure(s): EXPLORATORY LAPAROTOMY SMALL BOWEL RESECTION WITH MASS 04/10/2023   Small bowel mass with perforation  POD#4- exploratory laparotomy, small bowel resection 1/9 Dr. Polly -  AFVSS,  WBC 13.5 (downtrending), hgb stable 10.8 from 13, monitor -  await path -  Continue NG to LIWS and await bowel function, ok for limited ice chips and hard candy for comfort  - moist-to-dry dressing midline wound  -  Cr 1.50 - avoid nephrotoxic agents, no toradol . Continue IVF  -  Abdominal binder ordered for when OOB - DVT ppx with lovenox  -  continue Zosyn , 5 days    Appreciate TRH following and assisting with steroid mgmt, AKI    LOS: 4 days   No secure chat available for me given surgeries/clinic/off post call which would lead to a delay in care.    Lonni Pizza, MD [facility]

## 2023-04-15 LAB — CBC
HCT: 32.7 % — ABNORMAL LOW (ref 39.0–52.0)
Hemoglobin: 10.6 g/dL — ABNORMAL LOW (ref 13.0–17.0)
MCH: 28 pg (ref 26.0–34.0)
MCHC: 32.4 g/dL (ref 30.0–36.0)
MCV: 86.3 fL (ref 80.0–100.0)
Platelets: 279 10*3/uL (ref 150–400)
RBC: 3.79 MIL/uL — ABNORMAL LOW (ref 4.22–5.81)
RDW: 14.8 % (ref 11.5–15.5)
WBC: 13.4 10*3/uL — ABNORMAL HIGH (ref 4.0–10.5)
nRBC: 0 % (ref 0.0–0.2)

## 2023-04-15 LAB — AEROBIC/ANAEROBIC CULTURE W GRAM STAIN (SURGICAL/DEEP WOUND)

## 2023-04-15 LAB — BASIC METABOLIC PANEL
Anion gap: 8 (ref 5–15)
BUN: 13 mg/dL (ref 8–23)
CO2: 27 mmol/L (ref 22–32)
Calcium: 8.7 mg/dL — ABNORMAL LOW (ref 8.9–10.3)
Chloride: 106 mmol/L (ref 98–111)
Creatinine, Ser: 1.22 mg/dL (ref 0.61–1.24)
GFR, Estimated: >60 mL/min (ref >60)
Glucose, Bld: 172 mg/dL — ABNORMAL HIGH (ref 70–99)
Potassium: 3.7 mmol/L (ref 3.5–5.1)
Sodium: 141 mmol/L (ref 135–145)

## 2023-04-15 LAB — GLUCOSE, CAPILLARY
Glucose-Capillary: 157 mg/dL — ABNORMAL HIGH (ref 70–99)
Glucose-Capillary: 172 mg/dL — ABNORMAL HIGH (ref 70–99)
Glucose-Capillary: 173 mg/dL — ABNORMAL HIGH (ref 70–99)
Glucose-Capillary: 197 mg/dL — ABNORMAL HIGH (ref 70–99)
Glucose-Capillary: 217 mg/dL — ABNORMAL HIGH (ref 70–99)
Glucose-Capillary: 218 mg/dL — ABNORMAL HIGH (ref 70–99)
Glucose-Capillary: 220 mg/dL — ABNORMAL HIGH (ref 70–99)

## 2023-04-15 MED ORDER — INSULIN ASPART 100 UNIT/ML IJ SOLN
0.0000 [IU] | Freq: Three times a day (TID) | INTRAMUSCULAR | Status: DC
  Administered 2023-04-15: 2 [IU] via SUBCUTANEOUS
  Administered 2023-04-15: 3 [IU] via SUBCUTANEOUS
  Administered 2023-04-16 (×2): 1 [IU] via SUBCUTANEOUS
  Administered 2023-04-16: 3 [IU] via SUBCUTANEOUS

## 2023-04-15 MED ORDER — HYDRALAZINE HCL 20 MG/ML IJ SOLN
5.0000 mg | Freq: Four times a day (QID) | INTRAMUSCULAR | Status: DC | PRN
  Filled 2023-04-15: qty 1

## 2023-04-15 MED ORDER — INSULIN ASPART 100 UNIT/ML IJ SOLN
0.0000 [IU] | Freq: Every day | INTRAMUSCULAR | Status: DC
  Administered 2023-04-15: 2 [IU] via SUBCUTANEOUS

## 2023-04-15 MED ORDER — PREDNISONE 20 MG PO TABS
22.5000 mg | ORAL_TABLET | Freq: Every day | ORAL | Status: DC
  Administered 2023-04-15 – 2023-04-17 (×3): 22.5 mg via ORAL
  Filled 2023-04-15 (×3): qty 1

## 2023-04-15 MED ORDER — FINASTERIDE 5 MG PO TABS
5.0000 mg | ORAL_TABLET | Freq: Every day | ORAL | Status: DC
  Administered 2023-04-15 – 2023-04-17 (×3): 5 mg via ORAL
  Filled 2023-04-15 (×3): qty 1

## 2023-04-15 MED ORDER — PANTOPRAZOLE SODIUM 40 MG PO TBEC
40.0000 mg | DELAYED_RELEASE_TABLET | Freq: Every day | ORAL | Status: DC
  Administered 2023-04-15 – 2023-04-17 (×3): 40 mg via ORAL
  Filled 2023-04-15 (×3): qty 1

## 2023-04-15 MED ORDER — MINOXIDIL 2.5 MG PO TABS
2.5000 mg | ORAL_TABLET | Freq: Every day | ORAL | Status: DC
  Administered 2023-04-15 – 2023-04-17 (×3): 2.5 mg via ORAL
  Filled 2023-04-15 (×3): qty 1

## 2023-04-15 MED ORDER — LIVING WELL WITH DIABETES BOOK
Freq: Once | Status: AC
  Filled 2023-04-15: qty 1

## 2023-04-15 MED ORDER — RISAQUAD PO CAPS
1.0000 | ORAL_CAPSULE | Freq: Every day | ORAL | Status: DC
  Administered 2023-04-15 – 2023-04-17 (×3): 1 via ORAL
  Filled 2023-04-15 (×3): qty 1

## 2023-04-15 MED ORDER — ALFUZOSIN HCL ER 10 MG PO TB24
10.0000 mg | ORAL_TABLET | Freq: Every day | ORAL | Status: DC
  Administered 2023-04-15 – 2023-04-17 (×3): 10 mg via ORAL
  Filled 2023-04-15 (×3): qty 1

## 2023-04-15 NOTE — Progress Notes (Addendum)
 PROGRESS NOTE   Elijah [name]  FMW:[id number]    DOB: Mar 25, 1956    DOA: 04/10/2023  PCP: Elijah Lenis, MD   I have briefly reviewed patients previous medical records in [facility].  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  CT abdomen concerning for small bowel mass with perforation.  Surgeons admitted the patient and he underwent exploratory laparotomy and small bowel resection 1/9.  TRH were consulted for assistance with management of PMR, chronic steroids and other medical problems.  Postop course complicated by expected postop ileus, acute on chronic kidney disease.  Improving.  Tolerating clear liquid diet, being advanced.  Steroids switched to p.o.  DM coordinator input pending.  Assessment & Plan:  Principal Problem:   Small bowel perforation (HCC)   Small bowel mass with perforation/Klebsiella pneumoniae peritonitis Management per primary service. Remains n.p.o., NG tube to low wall suction, IV fluids. Peritoneal fluid showed rare Klebsiella pneumonia, sensitivities to all antibiotics tested except ampicillin.  Continue IV Zosyn ,'s can be switched to oral antibiotics at time of discharge.  Duration deferred to primary team. Postop ileus resolved.  Tolerating clear liquid diet, advance as tolerated.  NG tube out 1/13. As communicated with the surgical team yesterday, patient had OR washout of his peritoneal cavity, completed 4 to 5 days of IV antibiotics and they did not think any further was needed and IV Zosyn  was discontinued 1/13. Preliminary ascitic fluid cytology: No malignant cells identified.   Innumerable neutrophils with benign/reactive mesothelial cells compatible with acute peritonitis. Surgical pathology results pending.  Polymyalgia rheumatica Per patient, diagnosed by PCP Has follow-up appointment with Dr. Maya [name], Rheumatology later this month.  Keep outpatient follow-up. Was initially started on prednisone  40 mg daily in September 2024 and has been gradually tapered down to 22.5 mg daily. Managed on IV hydrocortisone  immediate postop until ileus resolved.  Now that ileus has resolved and now overt stressful situation, will transition back to home dose of prednisone  22.5 Mg daily.   GERD Changed IV to oral PPI.  Also on Pepcid  as needed at home.   Newly diagnosed type II DM with hyperglycemia Patient denies history of prediabetes or diabetes A1c 7.5.  A1c in March 2023 was 5.6.  Could also be steroid induced DM. Treated inpatient with NovoLog  SSI, labile and mildly uncontrolled CBGs. Diabetes coordinator consulted-input pending. Tolerating clear liquid diet, advanced to heart healthy and diabetic diet as tolerated. At discharge, consider metformin  500 Mg twice daily or 1 g at bedtime versus other agent with close outpatient follow-up with PCP.  Hypertension Blood pressures over the last 24 hours have been gradually creeping up. Resume home dose of minoxidil  2.5 Mg daily, unclear indication at home but could help his hypertension. As needed IV hydralazine  added. Could consider losartan 25 Mg daily at discharge with close outpatient follow-up of BMP.   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  Most recent creatinine in 2009 was in the 1.2-1.5 range then no further results noted since then.  Spouse brought some lab results from home today, creatinine of 1.5 in March 2023. Avoid hypotension and  nephrotoxic drugs. AKI likely multifactorial due to CT contrast on admit, GI losses due to postop ileus. No mention of hydronephrosis  on admission CT abdomen.   Creatinine had peaked to 1.83, IV hydration and creatinine down to 1.22.  Hypernatremia is also resolved. Discontinue IV fluids from IM standpoint.  Hypokalemia Replaced.  Magnesium normal.   OSA on nightly CPAP Resume CPAP now that the NG tube is out.   BPH: Resume Proscar  and alfuzosin .   Asthma Patient denies history of this. As needed albuterol  nebs.   Hypoxia Hypoxic immediate postop while in PACU requiring O2 supplements Acute respiratory failure with hypoxia ruled out Incentive spirometry (counseled patient and family extensively to make sure to use), titrate oxygen to maintain saturations >92% and wean off as tolerated.  Early mobilization. Hypoxia resolved.  Dilutional anemia Hemoglobin down to 11 >10.8 >10.3 >10.6.  EBL was 30 mL Hemoglobin stable.  Leukocytosis improving  Body mass index is 31.15 kg/m./Obesity Outpatient follow-up.   DVT prophylaxis: enoxaparin  (LOVENOX ) injection 40 mg Start: 04/11/23 1000 SCDs Start: 04/10/23 2101     Code Status: Full Code:  Family Communication: Spouse at bedside today.  She is a retired Scientist, Forensic. Disposition:  Postop ileus resolved.  Advancing diet.  Discharge disposition per primary service,?  DC 1/15.  Surgical pathology results pending.     Consultants:   TRH are consultants.  Procedures:   Exploratory laparotomy and small bowel resection 1/9  Antimicrobials:   IV Zosyn -discontinued 1/13   Subjective:  Tolerating 100% of clear liquid diet.  NG tube removed yesterday.  Had a BM this morning.  Passing flatus.  No abdominal pain.  Continues to mobilize.  Objective:   Vitals:   04/14/23 1951 04/15/23 0450 04/15/23 0500 04/15/23 0841  BP: (!) 157/89 (!) 173/91  (!) 189/83  Pulse: 74 65  97  Resp: 17 17  17   Temp: 98.9 F (37.2 C) 98 F (36.7 C)    TempSrc: Oral     SpO2: 97% 97%  97%  Weight:   107.1 kg   Height:        General exam: Middle-age male, moderately built and  obese sitting up comfortably in chair. ENT: NGT out. Respiratory system: Clear to auscultation.  No increased work of breathing. Cardiovascular system: S1 & S2 heard, RRR. No JVD, murmurs, rubs, gallops or clicks. No pedal edema. Gastrointestinal system: Abdomen is mildly distended, soft and nontender.  Has abdominal binder on.  Bowel sounds present. Central nervous system: Alert and oriented. No focal neurological deficits. Extremities: Symmetric 5 x 5 power. Skin: Extensive rash over right upper anterior chest and right upper extremity reported to be hemangioma per patient.  Chronic. Psychiatry: Judgement and insight appear normal. Mood & affect appropriate.     Data Reviewed:   I have personally reviewed following labs and imaging studies   CBC: Recent Labs  Lab 04/10/23 0629 04/10/23 2257 04/13/23 0547 04/14/23 0455 04/15/23 0603  WBC 15.1*   < > 13.5* 12.8* 13.4*  NEUTROABS 11.5*  --   --   --   --   HGB 13.5   < > 10.8* 10.3* 10.6*  HCT 42.1   < > 34.3* 32.6* 32.7*  MCV 87.5   < > 88.9 88.6 86.3  PLT 379   < > 239 255 279   < > = values in this interval not displayed.    Basic Metabolic Panel: Recent Labs  Lab 04/11/23 0631 04/12/23 0440 04/13/23 0547 04/14/23 0455  04/15/23 0603  NA 141 143 146* 142 141  K 4.6 4.0 3.7 3.2* 3.7  CL 106 109 110 103 106  CO2 25 28 30 24 27   GLUCOSE 206* 151* 143* 151* 172*  BUN 24* 24* 21 18 13   CREATININE 1.76* 1.83* 1.50* 1.43* 1.22  CALCIUM 9.0 8.9 8.9 8.5* 8.7*  MG  --   --   --  2.1  --     Liver Function Tests: Recent Labs  Lab 04/10/23 0629  AST 18  ALT 23  ALKPHOS 61  BILITOT 0.4  PROT 6.9  ALBUMIN  4.0    CBG: Recent Labs  Lab 04/15/23 0000 04/15/23 0056 04/15/23 0454  GLUCAP 218* 220* 173*    Microbiology Studies:   Recent Results (from the past 240 hours)  Aerobic/Anaerobic Culture w Gram Stain (surgical/deep wound)     Status: None (Preliminary result)   Collection Time: 04/10/23  2:15 PM    Specimen: Path fluid; Body Fluid  Result Value Ref Range Status   Specimen Description PERITONEAL  Final   Special Requests NONE  Final   Gram Stain   Final    ABUNDANT WBC PRESENT, PREDOMINANTLY PMN RARE GRAM POSITIVE RODS CRITICAL RESULT CALLED TO, READ BACK BY AND VERIFIED WITH: MD MEZZER ON 04/10/23 @ 1537 BY DRT Performed at [facility], [address] [place], [place] [address]    Culture   Final    RARE KLEBSIELLA PNEUMONIAE FEW LACTOBACILLUS SPECIES Standardized susceptibility testing for this organism is not available. NO ANAEROBES ISOLATED; CULTURE IN PROGRESS FOR 5 DAYS    Report Status PENDING  Incomplete   Organism ID, Bacteria KLEBSIELLA PNEUMONIAE  Final      Susceptibility   Klebsiella pneumoniae - MIC*    AMPICILLIN >=32 RESISTANT Resistant     CEFEPIME  <=0.12 SENSITIVE Sensitive     CEFTAZIDIME <=1 SENSITIVE Sensitive     CEFTRIAXONE  <=0.25 SENSITIVE Sensitive     CIPROFLOXACIN <=0.25 SENSITIVE Sensitive     GENTAMICIN  <=1 SENSITIVE Sensitive     IMIPENEM <=0.25 SENSITIVE Sensitive     TRIMETH /SULFA  <=20 SENSITIVE Sensitive     AMPICILLIN/SULBACTAM 4 SENSITIVE Sensitive     PIP/TAZO <=4 SENSITIVE Sensitive ug/mL    * RARE KLEBSIELLA PNEUMONIAE    Radiology Studies:  No results found.   Scheduled Meds:    acetaminophen   650 mg Oral Q6H   alfuzosin   10 mg Oral Daily   enoxaparin  (LOVENOX ) injection  40 mg Subcutaneous Q24H   feeding supplement  1 Container Oral TID BM   finasteride   5 mg Oral Daily   insulin  aspart  0-5 Units Subcutaneous QHS   insulin  aspart  0-9 Units Subcutaneous TID WC   methocarbamol   1,000 mg Oral TID   minoxidil   2.5 mg Oral Daily   pantoprazole   40 mg Oral Daily   predniSONE   22.5 mg Oral Daily   Probiotic  1 capsule Oral Daily    Continuous Infusions:       LOS: 5 days     Trenda Mar, MD,  FACP, [facility], [facility], [facility]   [facility] [place]      To contact the  attending provider between 7A-7P or the covering provider during after hours 7P-7A, please log into the web site [email or web address] and access using universal [place] password for that web site. If you do not have the password, please call the hospital operator.  04/15/2023, 8:46 AM

## 2023-04-15 NOTE — Progress Notes (Signed)
 Physical Therapy Treatment Patient Details Name: Elijah [name] MRN: [id number] DOB: May 18, 1955 Today's Date: 04/15/2023   History of Present Illness Patient is 68 y.o. male [facility]10[facility] w/ small bowel mass w/ perforation. S/p ex-lap and small bowel resection 1/9. PMHx significant for polymyalgia rheumatica, BPH, GERD, DM (newly diagnosed), asthma.    PT Comments  Pt received in chair, agreeable to therapy session with emphasis on stair negotiation and standing exercises in his room, pt defers hallway ambulation as he recently returned from community distance ambulation task with spouse assist in hallway. Pt given up to CGA for safety with stair negotiation (7 step in his room) and standing exercises with BUE support, handout given to reinforce exercises performed along with limiting movements to pain-free or lower pain range. Pt up in bathroom in care of spouse at end of session.  Anticipate pt likely to progress past acute PT needs in next 1-2 sessions, he's making excellent progress toward his goals.   If plan is discharge home, recommend the following: A little help with walking and/or transfers;Assistance with cooking/housework;Assist for transportation;Help with stairs or ramp for entrance;A little help with bathing/dressing/bathroom   Can travel by private vehicle        Equipment Recommendations  None recommended by PT    Recommendations for Other Services       Precautions / Restrictions Precautions Precautions: Other (comment);Fall Precaution Comments: abdominal protection precs Required Braces or Orthoses: Other Brace Other Brace: abdominal binder Restrictions Weight Bearing Restrictions Per Provider Order: No Other Position/Activity Restrictions: watch drain and lines     Mobility  Bed Mobility Overal bed mobility: Needs Assistance             General bed mobility comments: pt received up in chair    Transfers Overall transfer level: Needs  assistance Equipment used: None Transfers: Sit to/from Stand Sit to Stand: Supervision           General transfer comment: Supervision from recliner, no AD, pt transferring to bathroom with spouse assist at end of session    Ambulation/Gait Ambulation/Gait assistance: Contact guard assist, Supervision Gait Distance (Feet): 20 Feet Assistive device: None Gait Pattern/deviations: Step-through pattern, Decreased stride length Gait velocity: limited assessment     General Gait Details: distance limited due to pt fatigue after recent ~521ft amb with spouse in hallway, pt agreeable to stair negotiation and standing exercises in his room, and walked into bathroom at end of session without AD (RW only for stairs/standing exercises)   Stairs Stairs: Yes Stairs assistance: Contact guard assist Stair Management: Step to pattern, Forwards, Backwards, With walker Number of Stairs: 30 General stair comments: single 7 step in room x15 reps x2 sets, only a short standing break. DOE 2/4 and SpO2 96% on RA and HR 89 bpm between trials. Focus more on step-ups as BLE strengthening exercise as pt did not need verbal cues for safe technique after initial instruction, CGA mostly to stabilize RW/rail   Wheelchair Mobility     Tilt Bed    Modified Rankin (Stroke Patients Only)       Balance Overall balance assessment: Mild deficits observed, not formally tested                                          Cognition Arousal: Alert Behavior During Therapy: WFL for tasks assessed/performed Overall Cognitive Status: Within Functional Limits  for tasks assessed                                 General Comments: pleasantly cooperative        Exercises Other Exercises Other Exercises: standing BLE AROM: hamstring curls, heel raises, hip abduction x10-15 reps ea and step-ups x30 reps for BLE strengthening  (handout given to reinforce) pt defers standing hip flexion  after performing step-ups x30 reps due to abd discomfort    General Comments General comments (skin integrity, edema, etc.): abdominal binder donned throughout, no visible drainage appearing, drain appears intact      Pertinent Vitals/Pain Pain Assessment Pain Assessment: 0-10 Pain Score: 4  Pain Location: abdomen with step-ups and standing exercises Pain Descriptors / Indicators: Sore, Guarding Pain Intervention(s): Limited activity within patient's tolerance, Monitored during session    Home Living                          Prior Function            PT Goals (current goals can now be found in the care plan section) Acute Rehab PT Goals Patient Stated Goal: To be able to go to [place] wtih spouse in a few months PT Goal Formulation: With patient Time For Goal Achievement: 04/25/23 Progress towards PT goals: Progressing toward goals    Frequency    Min 1X/week      PT Plan      Co-evaluation              AM-PAC PT 6 Clicks Mobility   Outcome Measure  Help needed turning from your back to your side while in a flat bed without using bedrails?: A Little Help needed moving from lying on your back to sitting on the side of a flat bed without using bedrails?: A Little Help needed moving to and from a bed to a chair (including a wheelchair)?: A Little Help needed standing up from a chair using your arms (e.g., wheelchair or bedside chair)?: A Little Help needed to walk in hospital room?: A Little Help needed climbing 3-5 steps with a railing? : A Little 6 Click Score: 18    End of Session Equipment Utilized During Treatment: Other (comment) (abdominal binder; defer gait belt given abd pain and pt wanting to limit mobility to shorter distance in room, no significant pain or distress) Activity Tolerance: Patient tolerated treatment well Patient left: in chair;with call bell/phone within reach;with family/visitor present (toilet with spouse assist) Nurse  Communication: Mobility status PT Visit Diagnosis: Muscle weakness (generalized) (M62.81);Difficulty in walking, not elsewhere classified (R26.2);Pain Pain - part of body:  (abdominal incision)     Time: 8661-8650 PT Time Calculation (min) (ACUTE ONLY): 11 min  Charges:    $Therapeutic Exercise: 8-22 mins PT General Charges $$ ACUTE PT VISIT: 1 Visit                     Maame Dack P., PTA Acute Rehabilitation Services Secure Chat Preferred 9a-5:30pm Office: [phone]    [facility] 04/15/2023, 2:13 PM

## 2023-04-15 NOTE — Discharge Instructions (Signed)
[facility]      [facility] [phone]  OPEN ABDOMINAL SURGERY: POST OP INSTRUCTIONS  Always review your discharge instruction sheet given to you by the facility where your surgery was performed.  IF YOU HAVE DISABILITY OR FAMILY LEAVE FORMS, YOU MUST BRING THEM TO THE OFFICE FOR PROCESSING.  PLEASE DO NOT GIVE THEM TO YOUR DOCTOR.  A prescription for pain medication may be given to you upon discharge.  Take your pain medication as prescribed, if needed.  If narcotic pain medicine is not needed, then you may take acetaminophen (Tylenol) or ibuprofen (Advil) as needed. Take your usually prescribed medications unless otherwise directed. If you need a refill on your pain medication, please contact your pharmacy. They will contact our office to request authorization.  Prescriptions will not be filled after 5pm or on week-ends. You should follow a light diet the first few days after arrival home, such as soup and crackers, pudding, etc.unless your doctor has advised otherwise. A high-fiber, low fat diet can be resumed as tolerated.   Be sure to include lots of fluids daily. Most patients will experience some swelling and bruising on the chest and neck area.  Ice packs will help.  Swelling and bruising can take several days to resolve Most patients will experience some swelling and bruising in the area of the incision. Ice pack will help. Swelling and bruising can take several days to resolve..  It is common to experience some constipation if taking pain medication after surgery.  Increasing fluid intake and taking a stool softener will usually help or prevent this problem from occurring.  A mild laxative (Milk of Magnesia or Miralax) should be taken according to package directions if there are no bowel movements after 48 hours.  You may have steri-strips (small skin tapes) in place directly over the incision.  These strips should be left on the skin for 7-10 days.  If your surgeon used skin  glue on the incision, you may shower in 24 hours.  The glue will flake off over the next 2-3 weeks.  Any sutures or staples will be removed at the office during your follow-up visit. You may find that a light gauze bandage over your incision may keep your staples from being rubbed or pulled. You may shower and replace the bandage daily. ACTIVITIES:  You may resume regular (light) daily activities beginning the next day--such as daily self-care, walking, climbing stairs--gradually increasing activities as tolerated.  You may have sexual intercourse when it is comfortable.  Refrain from any heavy lifting or straining until approved by your doctor. You may drive when you no longer are taking prescription pain medication, you can comfortably wear a seatbelt, and you can safely maneuver your car and apply brakes Return to Work: ___________________________________ You should see your doctor in the office for a follow-up appointment approximately two weeks after your surgery.  Make sure that you call for this appointment within a day or two after you arrive home to insure a convenient appointment time. OTHER INSTRUCTIONS:  _____________________________________________________________ _____________________________________________________________  WHEN TO CALL YOUR DOCTOR: Fever over 101.0 Inability to urinate Nausea and/or vomiting Extreme swelling or bruising Continued bleeding from incision. Increased pain, redness, or drainage from the incision. Difficulty swallowing or breathing Muscle cramping or spasms. Numbness or tingling in hands or feet or around lips.  The clinic staff is available to answer your questions during regular business hours.  Please don't hesitate to call and ask to speak to one of   the nurses if you have concerns.  For further questions, please visit [email or web address]  WOUND CARE: - midline dressing to be changed daily - supplies: sterile saline, gauze, scissors,  tape  - remove dressing and all packing carefully, moistening with sterile saline as needed to avoid packing/internal dressing sticking to the wound. - clean edges of skin around the wound with water/gauze, making sure there is no tape debris or leakage left on skin that could cause skin irritation or breakdown. - dampen and clean gauze with sterile saline and pack wound from wound base to skin level, making sure to take note of any possible areas of wound tracking, tunneling and packing appropriately. Wound can be packed loosely. Trim gauze to size if a whole gauze is not required. - cover wound with a dry gauze and secure with tape.  - write the date/time on the dry dressing/tape to better track when the last dressing change occurred. - change dressing as needed if leakage occurs, wound gets contaminated, or patient requests to shower. - patient may shower daily with wound open (i.e. remove all packing) and following the shower the wound should be dried and a clean dressing placed.   ================================================================================  Rivaroxaban (Xarelto) ED Discharge Instructions   Patient received a prescription for  Xarelto 15 & 20 mg - 51 tablet VTE STARTER PACK.   Patient understands only the FIRST 30 DAYS OF TREATMENT  will be provided by the starter pack.   Patient understands to contact primary care doctor or ED immediately if for any reason is unable to fill the starter pack prescription.  Patient must schedule a follow-up appointment with primary care doctor within 15 days of discharge in order to receive the maintenance prescription and clinical follow up.  Patient has received an education kit containing ([facility] Trial Offer Card, DVT/PE brochure, Dosing Diary, and Xarelto Medication Guide).   If not performed in the ED, patient will receive medication counseling by a [facility] pharmacist via phone follow-up within the next 72 hours. Pharmacist  to review signs and symptoms of bleeding and proper use of this medication.   Call 911 or return immediately to the nearest ED if you develop bleeding (e.g. nose, gums, vomit, urine, bloody or dark stools), unusual bruising, head trauma (even if minor), severe headache, altered mental status, change in speech, weakness on one side of body, shortness of breath, swollen lips/tongue/face/neck, chest pain, or other concerns.    Information on my medicine - XARELTO (rivaroxaban)  This medication education was provided to me or my healthcare representative as part of my discharge instructions.   WHY WAS XARELTO PRESCRIBED FOR YOU?  Xarelto was prescribed to treat blood clots that may have been found in the veins of your legs (deep vein thrombosis) or in your lungs (pulmonary embolism) and to reduce the risk of them occurring again.   WHAT DO YOU NEED TO KNOW ABOUT XARELTO?  The starting dose is one 15 mg tablet taken TWICE daily with food for the FIRST 21 DAYS then on day 22 the dose is changed to one 20 mg tablet taken ONCE A DAY with your evening meal.   DO NOT stop taking Xarelto without talking to the health care provider who prescribed the medication. Refill your prescription for 20 mg tablets before you run out.  After discharge, you should have regular check-up appointments with your healthcare provider that is prescribing your Xarelto. In the future your dose may need to be changed if your kidney function   changes by a significant amount.   WHAT DO YOU DO IF YOU MISS A DOSE?  If you are taking Xarelto TWICE DAILY and you miss a dose, take it as soon as you remember. You may take two 15 mg tablets (total 30 mg) at the same time then resume your regularly scheduled 15 mg twice daily the next day.   If you are taking Xarelto ONCE DAILY and you miss a dose, take it as soon as you remember on the same day then continue your regularly scheduled once daily regimen the next day. Do not take two  doses of Xarelto at the same time.   IMPORTANT SAFETY INFORMATION  Xarelto is a blood thinner medicine that can cause bleeding. You should call your healthcare provider right away if you experience any of the following:  -  Bleeding from an injury or your nose that does not stop.  -  Unusual colored urine (red or dark brown) or unusual colored stools (red or black).  -  Unusual bruising for unknown reasons.  -  A serious fall or if you hit your head (even if there is no bleeding).   Some medicines may interact with Xarelto and might increase your risk of bleeding while on Xarelto. To help avoid this, consult your healthcare provider or pharmacist prior to using any new prescription or non-prescription medications, including herbals, vitamins, non-steroidal anti-inflammatory drugs (NSAIDs) and supplements.   This website has more information on Xarelto: www.xarelto.com. 

## 2023-04-15 NOTE — Plan of Care (Signed)
  Problem: Education: Goal: Knowledge of General Education information will improve Description: Including pain rating scale, medication(s)/side effects and non-pharmacologic comfort measures Outcome: Progressing   Problem: Activity: Goal: Risk for activity intolerance will decrease Outcome: Progressing   Problem: Elimination: Goal: Will not experience complications related to urinary retention Outcome: Progressing   Problem: Pain Management: Goal: General experience of comfort will improve Outcome: Progressing

## 2023-04-15 NOTE — TOC Progression Note (Addendum)
 Transition of Care (TOC) - Progression Note   Randine with [employer] , running patient's [employer]  plan for approval for home negative wound pressure system. Home negative wound pressure system has been approved and at patient's bedside.  Patient Details  Name: Elijah [name] MRN: [id number] Date of Birth: 08/08/1955  [facility]) CM/SW Contact  Antigone Crowell, Powell Jansky, RN Phone Number: 04/15/2023, 1:43 PM  Clinical Narrative:       Expected Discharge Plan: Home w Home Health Services Barriers to Discharge: Continued Medical Work up  Expected Discharge Plan and Services   Discharge Planning Services: CM Consult Post Acute Care Choice: Home Health Living arrangements for the past 2 months: Single Family Home                 DME Arranged: Vac DME Agency: MIKEL Marshall) Date DME Agency Contacted: 04/14/23 Time DME Agency Contacted: 1147 Representative spoke with at DME Agency: Dwayne CHEADLE Arranged: RN HH Agency: [facility] Date [facility] Agency Contacted: 04/14/23 Time HH Agency Contacted: 1148 Representative spoke with at [facility] Agency: Darleene   Social Determinants of Health (SDOH) Interventions SDOH Screenings   Food Insecurity: No Food Insecurity (04/10/2023)  Housing: Low Risk  (04/10/2023)  Transportation Needs: No Transportation Needs (04/10/2023)  Utilities: Not At Risk (04/10/2023)  Tobacco Use: Low Risk  (04/10/2023)    Readmission Risk Interventions     No data to display

## 2023-04-15 NOTE — Progress Notes (Signed)
 Progress Note  5 Days Post-Op  Subjective: Pt passing flatus and tolerating NGT clamped, has only had sips so far and ice chips. Binder present. Pain well controlled. JP low output overnight.   Objective: Vital signs in last 24 hours: Temp:  [98 F (36.7 C)-98.9 F (37.2 C)] 98 F (36.7 C) (01/14 0450) Pulse Rate:  [65-97] 97 (01/14 0841) Resp:  [17] 17 (01/14 0841) BP: (157-189)/(83-91) 189/83 (01/14 0841) SpO2:  [96 %-97 %] 97 % (01/14 0841) Weight:  [107.1 kg] 107.1 kg (01/14 0500)    Intake/Output from previous day: 01/13 0701 - 01/14 0700 In: 1582.5 [P.O.:360; I.V.:772.5; IV Piggyback:450] Out: 32 [Urine:2; Drains:30] Intake/Output this shift: No intake/output data recorded.  PE: General: pleasant, WD, WN male who is up in chair in NAD Heart: rrr.  Lungs: Respiratory effort nonlabored Abd: soft, appropriately ttp, midline wound as below, JP with scant thin brown fluid   Psych: A&Ox3 with an appropriate affect.    Lab Results:  Recent Labs    04/14/23 0455 04/15/23 0603  WBC 12.8* 13.4*  HGB 10.3* 10.6*  HCT 32.6* 32.7*  PLT 255 279   BMET Recent Labs    04/14/23 0455 04/15/23 0603  NA 142 141  K 3.2* 3.7  CL 103 106  CO2 24 27  GLUCOSE 151* 172*  BUN 18 13  CREATININE 1.43* 1.22  CALCIUM 8.5* 8.7*   PT/INR No results for input(s): LABPROT, INR in the last 72 hours. CMP     Component Value Date/Time   NA 141 04/15/2023 0603   K 3.7 04/15/2023 0603   CL 106 04/15/2023 0603   CO2 27 04/15/2023 0603   GLUCOSE 172 (H) 04/15/2023 0603   BUN 13 04/15/2023 0603   CREATININE 1.22 04/15/2023 0603   CALCIUM 8.7 (L) 04/15/2023 0603   PROT 6.9 04/10/2023 0629   ALBUMIN  4.0 04/10/2023 0629   AST 18 04/10/2023 0629   ALT 23 04/10/2023 0629   ALKPHOS 61 04/10/2023 0629   BILITOT 0.4 04/10/2023 0629   GFRNONAA >60 04/15/2023 0603   GFRAA  06/17/2007 2140    >60        The eGFR has been calculated using the MDRD equation. This calculation  has not been validated in all clinical   Lipase     Component Value Date/Time   LIPASE 11 04/10/2023 0629       Studies/Results: No results found.  Anti-infectives: Anti-infectives (From admission, onward)    Start     Dose/Rate Route Frequency Ordered Stop   04/10/23 2200  piperacillin -tazobactam (ZOSYN ) IVPB 3.375 g        3.375 g 12.5 mL/hr over 240 Minutes Intravenous Every 8 hours 04/10/23 2101 04/15/23 0131   04/10/23 1000  piperacillin -tazobactam (ZOSYN ) IVPB 3.375 g        3.375 g 100 mL/hr over 30 Minutes Intravenous  Once 04/10/23 0948 04/10/23 1205        Assessment/Plan   Small bowel mass with perforation  POD#5- exploratory laparotomy, small bowel resection 1/9 Dr. Polly -  AFVSS,  WBC 12.8 (downtrending), hgb stable 10.3 -  await path - prelim appears like lymphoma, awaiting stains  - Advancing to soft diet today - VAC to midline today  - Cr 1.22 - avoid nephrotoxic agents, stable  - Abdominal binder ordered for when OOB  FEN: advance to soft today VTE: LMWH ID: Zosyn  1/9>1/13 stop after today   Polymyalgia Rheumatica - steroids per TRH, appreciate assistance  T2DM -  SSI GERD Asthma BPH  Dispo: home health, toleration of diet   LOS: 5 days   Lonni Pizza, MD [facility], [facility]

## 2023-04-16 ENCOUNTER — Inpatient Hospital Stay (HOSPITAL_COMMUNITY): Payer: Medicare Other

## 2023-04-16 ENCOUNTER — Other Ambulatory Visit: Payer: Self-pay

## 2023-04-16 ENCOUNTER — Other Ambulatory Visit (HOSPITAL_COMMUNITY): Payer: Self-pay

## 2023-04-16 DIAGNOSIS — K631 Perforation of intestine (nontraumatic): Secondary | ICD-10-CM | POA: Diagnosis not present

## 2023-04-16 LAB — GLUCOSE, CAPILLARY
Glucose-Capillary: 142 mg/dL — ABNORMAL HIGH (ref 70–99)
Glucose-Capillary: 138 mg/dL — ABNORMAL HIGH (ref 70–99)
Glucose-Capillary: 215 mg/dL — ABNORMAL HIGH (ref 70–99)
Glucose-Capillary: 126 mg/dL — ABNORMAL HIGH (ref 70–99)

## 2023-04-16 LAB — SURGICAL PATHOLOGY

## 2023-04-16 MED ORDER — OXYCODONE HCL 5 MG PO TABS
5.0000 mg | ORAL_TABLET | ORAL | 0 refills | Status: DC | PRN
  Filled 2023-04-16: qty 30, 5d supply, fill #0

## 2023-04-16 MED ORDER — METFORMIN HCL 850 MG PO TABS
ORAL_TABLET | ORAL | 0 refills | Status: DC
  Filled 2023-04-16: qty 67, 37d supply, fill #0
  Filled 2023-04-17: qty 64, 36d supply, fill #0

## 2023-04-16 MED ORDER — LANCETS MISC
1.0000 | Freq: Every day | 0 refills | Status: DC
  Filled 2023-04-16: qty 100, fill #0

## 2023-04-16 MED ORDER — IOHEXOL 9 MG/ML PO SOLN
500.0000 mL | ORAL | Status: AC
Start: 2023-04-16 — End: 2023-04-16
  Administered 2023-04-16 (×2): 500 mL via ORAL

## 2023-04-16 MED ORDER — BLOOD GLUCOSE TEST VI STRP
1.0000 | ORAL_STRIP | Freq: Every day | 0 refills | Status: DC
  Filled 2023-04-16 – 2023-04-17 (×2): qty 50, 50d supply, fill #0

## 2023-04-16 MED ORDER — LANCET DEVICE MISC
1.0000 | Freq: Every day | 0 refills | Status: DC
  Filled 2023-04-16: qty 1, fill #0

## 2023-04-16 MED ORDER — IOHEXOL 350 MG/ML SOLN
75.0000 mL | Freq: Once | INTRAVENOUS | Status: AC | PRN
  Administered 2023-04-16: 75 mL via INTRAVENOUS

## 2023-04-16 MED ORDER — METHOCARBAMOL 500 MG PO TABS
1000.0000 mg | ORAL_TABLET | Freq: Three times a day (TID) | ORAL | 1 refills | Status: DC | PRN
  Filled 2023-04-16 – 2023-04-30 (×2): qty 60, 10d supply, fill #0

## 2023-04-16 MED ORDER — BLOOD GLUCOSE MONITORING SUPPL DEVI
1.0000 | Freq: Every day | 0 refills | Status: DC
  Filled 2023-04-16: qty 1, fill #0

## 2023-04-16 MED ORDER — PREDNISONE 5 MG PO TABS: 22.5000 mg | ORAL_TABLET | Freq: Every day | ORAL | Status: DC

## 2023-04-16 MED ORDER — METFORMIN HCL 850 MG PO TABS
ORAL_TABLET | ORAL | 0 refills | Status: DC
  Filled 2023-04-16: qty 67, 37d supply, fill #0

## 2023-04-16 MED ORDER — ACCU-CHEK GUIDE TEST VI STRP
1.0000 | ORAL_STRIP | Freq: Every day | 0 refills | Status: DC
  Filled 2023-04-16: qty 1, fill #0
  Filled 2023-04-17: qty 50, 30d supply, fill #0

## 2023-04-16 MED ORDER — ACETAMINOPHEN 325 MG PO TABS: 650.0000 mg | ORAL_TABLET | Freq: Four times a day (QID) | ORAL | Status: DC | PRN

## 2023-04-16 MED ORDER — SODIUM CHLORIDE 0.9 % IV BOLUS
500.0000 mL | Freq: Once | INTRAVENOUS | Status: AC
  Administered 2023-04-16: 500 mL via INTRAVENOUS

## 2023-04-16 MED ORDER — BLOOD GLUCOSE TEST VI STRP
1.0000 | ORAL_STRIP | Freq: Every day | 0 refills | Status: DC
  Filled 2023-04-16: qty 100, 100d supply, fill #0

## 2023-04-16 MED ORDER — ACCU-CHEK GUIDE W/DEVICE KIT
1.0000 | PACK | Freq: Every day | 0 refills | Status: DC
  Filled 2023-04-16: qty 1, 30d supply, fill #0

## 2023-04-16 MED ORDER — ACCU-CHEK SOFTCLIX LANCETS MISC
1.0000 | Freq: Every day | 0 refills | Status: DC
  Filled 2023-04-16: qty 100, 100d supply, fill #0

## 2023-04-16 NOTE — Progress Notes (Signed)
Patient return back from CT

## 2023-04-16 NOTE — Progress Notes (Signed)
 Received referral from surgeon. Consulted with Dr Salomon Cree and will schedule appointment next week.

## 2023-04-16 NOTE — Progress Notes (Signed)
 Pt awaiting for CT abd, did not eat dinner and drank PO contrast. IV bolus complete. Wife and dtr at bedside. Patient has felt worse today with lots of gas and general malaise "feels like the flu."

## 2023-04-16 NOTE — Progress Notes (Signed)
 Progress Note  6 Days Post-Op  Subjective: Pt passing flatus and tolerating diet. Pain is well controlled. Binder present. Denies any complaints at present  Objective: Vital signs in last 24 hours: Temp:  [97.9 F (36.6 C)-98.2 F (36.8 C)] 98.2 F (36.8 C) (01/15 0710) Pulse Rate:  [59-74] 59 (01/15 0710) Resp:  [16-18] 16 (01/15 0710) BP: (138-161)/(73-81) 161/81 (01/15 0710) SpO2:  [95 %-98 %] 97 % (01/15 0710)    Intake/Output from previous day: 01/14 0701 - 01/15 0700 In: 1060 [P.O.:1060] Out: 40 [Drains:40] Intake/Output this shift: No intake/output data recorded.  PE: General: pleasant, WD, WN male who is up in bed in NAD Heart: rrr.  Lungs: Respiratory effort nonlabored Abd: soft, appropriately ttp, midline wound as below, JP with scant thin brown fluid. Wound VAC in place, serosanguinous output  Psych: A&Ox3 with an appropriate affect.    Lab Results:  Recent Labs    04/14/23 0455 04/15/23 0603  WBC 12.8* 13.4*  HGB 10.3* 10.6*  HCT 32.6* 32.7*  PLT 255 279   BMET Recent Labs    04/14/23 0455 04/15/23 0603  NA 142 141  K 3.2* 3.7  CL 103 106  CO2 24 27  GLUCOSE 151* 172*  BUN 18 13  CREATININE 1.43* 1.22  CALCIUM 8.5* 8.7*   PT/INR No results for input(s): "LABPROT", "INR" in the last 72 hours. CMP     Component Value Date/Time   NA 141 04/15/2023 0603   K 3.7 04/15/2023 0603   CL 106 04/15/2023 0603   CO2 27 04/15/2023 0603   GLUCOSE 172 (H) 04/15/2023 0603   BUN 13 04/15/2023 0603   CREATININE 1.22 04/15/2023 0603   CALCIUM 8.7 (L) 04/15/2023 0603   PROT 6.9 04/10/2023 0629   ALBUMIN  4.0 04/10/2023 0629   AST 18 04/10/2023 0629   ALT 23 04/10/2023 0629   ALKPHOS 61 04/10/2023 0629   BILITOT 0.4 04/10/2023 0629   GFRNONAA >60 04/15/2023 0603   GFRAA  06/17/2007 2140    >60        The eGFR has been calculated using the MDRD equation. This calculation has not been validated in all clinical   Lipase     Component Value  Date/Time   LIPASE 11 04/10/2023 0629       Studies/Results: No results found.  Anti-infectives: Anti-infectives (From admission, onward)    Start     Dose/Rate Route Frequency Ordered Stop   04/10/23 2200  piperacillin -tazobactam (ZOSYN ) IVPB 3.375 g        3.375 g 12.5 mL/hr over 240 Minutes Intravenous Every 8 hours 04/10/23 2101 04/15/23 0131   04/10/23 1000  piperacillin -tazobactam (ZOSYN ) IVPB 3.375 g        3.375 g 100 mL/hr over 30 Minutes Intravenous  Once 04/10/23 9562 04/10/23 1205        Assessment/Plan   Small bowel mass with perforation  POD#6- exploratory laparotomy, small bowel resection 1/9 Dr. Davonna Estes -  AFVSS,  -  await path - prelim appears like lymphoma per phone conversation, awaiting stains  - Tolerating soft - VAC - Cr 1.22 - avoid nephrotoxic agents, stable  - Abdominal binder ordered for when OOB  FEN: Soft VTE: LMWH ID: Zosyn  1/9>1/13  Polymyalgia Rheumatica - steroids per TRH, appreciate assistance  T2DM - SSI GERD Asthma BPH  Dispo: Home health for new wound VAC.  He and his wife were instructed on general expectations after surgery.  We have asked our office to place referrals  to medical oncology as an outpatient given the at least prelim pathology discussed over the phone with her pathologist.  Final pathology is currently pending.  We will arrange for follow-up with Dr. Davonna Estes as well.  He and his wife are both comfortable with and stable for discharge home today.   LOS: 6 days   Beatris Lincoln, MD [facility], [facility]

## 2023-04-16 NOTE — Plan of Care (Signed)
 Patient and wife shown how to stripe JP drain and empty it.

## 2023-04-16 NOTE — TOC Progression Note (Addendum)
 Transition of Care (TOC) - Progression Note   If patient discharges today , [facility] can change his home [facility] tomorrow .   Patient not being discharged today. Cory with [facility] updated.  Patient Details  Name: Elijah [name] MRN: [id number] Date of Birth: 1956/02/18  Transition of Care [facility]) CM/SW Contact  Rourke Mcquitty, Arturo Late, RN Phone Number: 04/16/2023, 11:57 AM  Clinical Narrative:       Expected Discharge Plan: Home w Home Health Services Barriers to Discharge: Continued Medical Work up  Expected Discharge Plan and Services   Discharge Planning Services: CM Consult Post Acute Care Choice: Home Health Living arrangements for the past 2 months: Single Family Home Expected Discharge Date: 04/16/23               DME Arranged: Alyson Jump DME Agency: Laury Portela) Date DME Agency Contacted: 04/14/23 Time DME Agency Contacted: 1147 Representative spoke with at DME Agency: Heyward Loud Arranged: RN HH Agency: [facility] Date [facility] Agency Contacted: 04/14/23 Time HH Agency Contacted: 1148 Representative spoke with at [facility] Agency: Randel Buss   Social Determinants of Health (SDOH) Interventions SDOH Screenings   Food Insecurity: No Food Insecurity (04/10/2023)  Housing: Low Risk  (04/10/2023)  Transportation Needs: No Transportation Needs (04/10/2023)  Utilities: Not At Risk (04/10/2023)  Tobacco Use: Low Risk  (04/10/2023)    Readmission Risk Interventions     No data to display

## 2023-04-16 NOTE — Progress Notes (Signed)
 PROGRESS NOTE    [name]  ZOX:[id number] DOB: 16-Jan-1956 DOA: 04/10/2023 PCP: Rae Bugler, MD   Brief Narrative: Elijah [name] is a 68 y.o. male with a history of polymyalgia rheumatica, GERD, OSA on CPAP, BPH.  Patient presented secondary to abdominal pain and vomiting and was found to have evidence of a small bowel mass with perforation. Patient underwent exploratory laparotomy and small bowel resection. TRH was consulted for medication management.   Assessment and Plan:  Small bowel mass with perforation Klebsiella pneumoniae peritonitis Management per general surgery.  Polymyalgia rheumatica Presumed diagnosis. Patient was started on prednisone  as an outpatient. Patient with appointment to see a rheumatologist next week. -Continue prednisone   GERD -Continue Protonix   Diabetes mellitus, type 2 Uncontrolled with hyperglycemia. Likely contributed to by chronic prednisone  usage. Hemoglobin A1C of 7.5%. Patient managed on SSI while inpatient with plan to transition to metformin  on discharge. -Continue SSI; carb modified diet -Follow-up with PCP  Primary hypertension -Continue minoxidil   AKI on CKD stage IIIa Baseline creatinine of about 1.2-1.5.  Peak creatinine of 1.8 which is improved with IV fluids.  Creatinine down to 1.22.  Hypokalemia Resolved with potassium supplementation.  OSA on CPAP -Continue CPAP  BPH -Continue Proscar  and alfuzosin   Hypoxia Transient while postop. Resolved.  Acute anemia Presumed dilutional.  Obesity, class I Estimated body mass index is 31.15 kg/m as calculated from the following:   Height as of this encounter: 6\' 1"  (1.854 m).   Weight as of this encounter: 107.1 kg.   DVT prophylaxis: Per primary Code Status:   Code Status: Full Code Family Communication: Wife at bedside, daughter on telephone Disposition Plan: Per primary    Subjective: Patient reports no concerns this morning. Feels  well.  Objective: BP (!) 161/81 (BP Location: Left Arm)   Pulse (!) 59   Temp 98.2 F (36.8 C) (Oral)   Resp 16   Ht 6\' 1"  (1.854 m)   Wt 107.1 kg   SpO2 97%   BMI 31.15 kg/m   Examination:  General exam: Appears calm and comfortable Respiratory system: Clear to auscultation. Respiratory effort normal. Cardiovascular system: S1 & S2 heard, RRR. No murmurs. Gastrointestinal system: Abdomen is nondistended, soft and nontender. Normal bowel sounds heard. Central nervous system: Alert and oriented. No focal neurological deficits. Psychiatry: Judgement and insight appear normal. Mood & affect appropriate.    Data Reviewed: I have personally reviewed following labs and imaging studies  CBC Lab Results  Component Value Date   WBC 13.4 (H) 04/15/2023   RBC 3.79 (L) 04/15/2023   HGB 10.6 (L) 04/15/2023   HCT 32.7 (L) 04/15/2023   MCV 86.3 04/15/2023   MCH 28.0 04/15/2023   PLT 279 04/15/2023   MCHC 32.4 04/15/2023   RDW 14.8 04/15/2023   LYMPHSABS 2.5 04/10/2023   MONOABS 0.8 04/10/2023   EOSABS 0.1 04/10/2023   BASOSABS 0.1 04/10/2023     Last metabolic panel Lab Results  Component Value Date   NA 141 04/15/2023   K 3.7 04/15/2023   CL 106 04/15/2023   CO2 27 04/15/2023   BUN 13 04/15/2023   CREATININE 1.22 04/15/2023   GLUCOSE 172 (H) 04/15/2023   GFRNONAA >60 04/15/2023   GFRAA  06/17/2007    >60        The eGFR has been calculated using the MDRD equation. This calculation has not been validated in all clinical   CALCIUM 8.7 (L) 04/15/2023   PROT 6.9 04/10/2023   ALBUMIN   4.0 04/10/2023   BILITOT 0.4 04/10/2023   ALKPHOS 61 04/10/2023   AST 18 04/10/2023   ALT 23 04/10/2023   ANIONGAP 8 04/15/2023    GFR: Estimated Creatinine Clearance: 77.5 mL/min (by C-G formula based on SCr of 1.22 mg/dL).  Recent Results (from the past 240 hours)  Aerobic/Anaerobic Culture w Gram Stain (surgical/deep wound)     Status: None   Collection Time: 04/10/23  2:15 PM    Specimen: Path fluid; Body Fluid  Result Value Ref Range Status   Specimen Description PERITONEAL  Final   Special Requests NONE  Final   Gram Stain   Final    ABUNDANT WBC PRESENT, PREDOMINANTLY PMN RARE GRAM POSITIVE RODS CRITICAL RESULT CALLED TO, READ BACK BY AND VERIFIED WITH: MD MEZZER ON 04/10/23 @ 1537 BY DRT    Culture   Final    RARE KLEBSIELLA PNEUMONIAE FEW LACTOBACILLUS SPECIES Standardized susceptibility testing for this organism is not available. NO ANAEROBES ISOLATED Performed at [facility], [address] [place], [place] [address]    Report Status 04/15/2023 FINAL  Final   Organism ID, Bacteria KLEBSIELLA PNEUMONIAE  Final      Susceptibility   Klebsiella pneumoniae - MIC*    AMPICILLIN >=32 RESISTANT Resistant     CEFEPIME  <=0.12 SENSITIVE Sensitive     CEFTAZIDIME <=1 SENSITIVE Sensitive     CEFTRIAXONE  <=0.25 SENSITIVE Sensitive     CIPROFLOXACIN <=0.25 SENSITIVE Sensitive     GENTAMICIN  <=1 SENSITIVE Sensitive     IMIPENEM <=0.25 SENSITIVE Sensitive     TRIMETH /SULFA  <=20 SENSITIVE Sensitive     AMPICILLIN/SULBACTAM 4 SENSITIVE Sensitive     PIP/TAZO <=4 SENSITIVE Sensitive ug/mL    * RARE KLEBSIELLA PNEUMONIAE      Radiology Studies: No results found.    LOS: 6 days    Aneita Keens, MD [facility] 04/16/2023, 11:26 AM   If 7PM-7AM, please contact night-coverage [email or web address]

## 2023-04-16 NOTE — Hospital Course (Signed)
 Elijah [name] is a 68 y.o. male with a history of polymyalgia rheumatica, GERD, OSA on CPAP, BPH.  Patient presented secondary to abdominal pain and vomiting and was found to have evidence of a small bowel mass with perforation. Patient underwent exploratory laparotomy and small bowel resection. TRH was consulted for medication management.

## 2023-04-16 NOTE — Progress Notes (Signed)
 Call made to the [employer] at request of daughter regarding her husband who is stationed in [place] . Case # [id number]. The # if we need it for the [employer] is [phone].

## 2023-04-17 ENCOUNTER — Other Ambulatory Visit (HOSPITAL_COMMUNITY): Payer: Self-pay

## 2023-04-17 DIAGNOSIS — E1165 Type 2 diabetes mellitus with hyperglycemia: Secondary | ICD-10-CM | POA: Diagnosis not present

## 2023-04-17 LAB — CREATININE, SERUM
Creatinine, Ser: 1.27 mg/dL — ABNORMAL HIGH (ref 0.61–1.24)
GFR, Estimated: >60 mL/min (ref >60)

## 2023-04-17 LAB — GLUCOSE, CAPILLARY
Glucose-Capillary: 111 mg/dL — ABNORMAL HIGH (ref 70–99)
Glucose-Capillary: 127 mg/dL — ABNORMAL HIGH (ref 70–99)

## 2023-04-17 MED ORDER — AMOXICILLIN-POT CLAVULANATE 875-125 MG PO TABS
1.0000 | ORAL_TABLET | Freq: Two times a day (BID) | ORAL | 0 refills | Status: DC
  Filled 2023-04-17: qty 28, 14d supply, fill #0

## 2023-04-17 MED ORDER — ONDANSETRON 4 MG PO TBDP
4.0000 mg | ORAL_TABLET | Freq: Four times a day (QID) | ORAL | 0 refills | Status: AC | PRN
  Filled 2023-04-17: qty 12, 3d supply, fill #0

## 2023-04-17 NOTE — TOC Progression Note (Signed)
Transition of Care (TOC) - Progression Note   [facility] updated on discharge today  Patient Details  Name: Elijah [name] MRN: [id number] Date of Birth: Jan 07, 1956  Transition of Care [facility]) CM/SW Contact  Indria Bishara, Adria Devon, RN Phone Number: 04/17/2023, 1:07 PM  Clinical Narrative:       Expected Discharge Plan: Home w Home Health Services Barriers to Discharge: Continued Medical Work up  Expected Discharge Plan and Services   Discharge Planning Services: CM Consult Post Acute Care Choice: Home Health Living arrangements for the past 2 months: Single Family Home Expected Discharge Date: 04/17/23               DME Arranged: [facility] DME Agency: Abbey Chatters) Date DME Agency Contacted: 04/14/23 Time DME Agency Contacted: 1147 Representative spoke with at DME Agency: Manus Gunning Arranged: RN HH Agency: [facility] Contacted: 04/14/23 Time HH Agency Contacted: 1148 Representative spoke with at [facility] Agency: Kandee Keen   Social Determinants of Health (SDOH) Interventions SDOH Screenings   Food Insecurity: No Food Insecurity (04/10/2023)  Housing: Low Risk  (04/10/2023)  Transportation Needs: No Transportation Needs (04/10/2023)  Utilities: Not At Risk (04/10/2023)  Tobacco Use: Low Risk  (04/10/2023)    Readmission Risk Interventions     No data to display

## 2023-04-17 NOTE — Progress Notes (Signed)
PROGRESS NOTE    Elijah [name]  ZOX:[id number] DOB: 06/10/1955 DOA: 04/10/2023 PCP: Tally Joe, MD   Brief Narrative: Elijah [name] is a 68 y.o. male with a history of polymyalgia rheumatica, GERD, OSA on CPAP, BPH.  Patient presented secondary to abdominal pain and vomiting and was found to have evidence of a small bowel mass with perforation. Patient underwent exploratory laparotomy and small bowel resection. TRH was consulted for medication management.   Assessment and Plan:  Small bowel mass with perforation Klebsiella pneumoniae peritonitis Management per general surgery. Recent CT scan obtained secondary to worsening pain significant for area of fluid and gas. Afebrile. Abdominal pain improved today.  Polymyalgia rheumatica Presumed diagnosis. Patient was started on prednisone as an outpatient. Patient with appointment to see a rheumatologist next week. 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 -Continue prednisone at reduced dose of 22.5 mg  T-cell lymphoma Diagnosed via biopsy. Medical oncology referral placed. Per documentation, plan for patient to follow-up with Dr. Candise Che next week.  GERD -Continue Protonix  Diabetes mellitus, type 2 Uncontrolled with hyperglycemia. Likely contributed to by chronic prednisone usage. Hemoglobin A1C of 7.5%. Patient managed on SSI while inpatient with plan to transition to metformin on discharge. -Continue SSI; carb modified diet -Follow-up with PCP  Primary hypertension Patient was previously on amlodipine and metoprolol, both of which were discontinued secondary to intolerance, per cardiology records. -Continue minoxidil  AKI on CKD stage IIIa Baseline creatinine of about 1.2-1.5.  Peak creatinine of 1.8 which is improved with IV fluids.  Creatinine down to 1.22 and stable at 1.27 today.  Hypokalemia Resolved with potassium supplementation.  OSA on CPAP -Continue CPAP  BPH -Continue Proscar and  alfuzosin  Hypoxia Transient while postop. Resolved.  Acute anemia Presumed dilutional.  Obesity, class I Estimated body mass index is 31.12 kg/m as calculated from the following:   Height as of this encounter: 6\' 1"  (1.854 m).   Weight as of this encounter: 107 kg.   DVT prophylaxis: Per primary Code Status:   Code Status: Full Code Family Communication: Wife at bedside Disposition Plan: Per primary    Subjective: Feeling a bit better than yesterday as it relates to his abdominal pain. No other issues overnight.  Objective: BP (!) 140/78 Comment: Notfiied RN  Pulse 70   Temp 98.4 F (36.9 C) (Oral)   Resp 14   Ht 6\' 1"  (1.854 m)   Wt 107 kg   SpO2 97%   BMI 31.12 kg/m   Examination:  General exam: Appears calm and comfortable Respiratory system: Clear to auscultation. Respiratory effort normal. Cardiovascular system: S1 & S2 heard, RRR. No murmurs, rubs, gallops or clicks. Gastrointestinal system: Abdominal binder in place. Normal bowel sounds. Central nervous system: Alert and oriented. No focal neurological deficits. Musculoskeletal: No edema. No calf tenderness Psychiatry: Judgement and insight appear normal. Mood & affect appropriate.    Data Reviewed: I have personally reviewed following labs and imaging studies  CBC Lab Results  Component Value Date   WBC 13.4 (H) 04/15/2023   RBC 3.79 (L) 04/15/2023   HGB 10.6 (L) 04/15/2023   HCT 32.7 (L) 04/15/2023   MCV 86.3 04/15/2023   MCH 28.0 04/15/2023   PLT 279 04/15/2023   MCHC 32.4 04/15/2023   RDW 14.8 04/15/2023   LYMPHSABS 2.5 04/10/2023   MONOABS 0.8 04/10/2023   EOSABS 0.1 04/10/2023   BASOSABS 0.1 04/10/2023     Last metabolic panel Lab Results  Component Value Date  NA 141 04/15/2023   K 3.7 04/15/2023   CL 106 04/15/2023   CO2 27 04/15/2023   BUN 13 04/15/2023   CREATININE 1.27 (H) 04/17/2023   GLUCOSE 172 (H) 04/15/2023   GFRNONAA >60 04/17/2023   GFRAA  06/17/2007    >60         The eGFR has been calculated using the MDRD equation. This calculation has not been validated in all clinical   CALCIUM 8.7 (L) 04/15/2023   PROT 6.9 04/10/2023   ALBUMIN 4.0 04/10/2023   BILITOT 0.4 04/10/2023   ALKPHOS 61 04/10/2023   AST 18 04/10/2023   ALT 23 04/10/2023   ANIONGAP 8 04/15/2023    GFR: Estimated Creatinine Clearance: 74.4 mL/min (A) (by C-G formula based on SCr of 1.27 mg/dL (H)).  Recent Results (from the past 240 hours)  Aerobic/Anaerobic Culture w Gram Stain (surgical/deep wound)     Status: None   Collection Time: 04/10/23  2:15 PM   Specimen: Path fluid; Body Fluid  Result Value Ref Range Status   Specimen Description PERITONEAL  Final   Special Requests NONE  Final   Gram Stain   Final    ABUNDANT WBC PRESENT, PREDOMINANTLY PMN RARE GRAM POSITIVE RODS CRITICAL RESULT CALLED TO, READ BACK BY AND VERIFIED WITH: MD MEZZER ON 04/10/23 @ 1537 BY DRT    Culture   Final    RARE KLEBSIELLA PNEUMONIAE FEW LACTOBACILLUS SPECIES Standardized susceptibility testing for this organism is not available. NO ANAEROBES ISOLATED Performed at [facility], [address] [place], [place] [address]    Report Status 04/15/2023 FINAL  Final   Organism ID, Bacteria KLEBSIELLA PNEUMONIAE  Final      Susceptibility   Klebsiella pneumoniae - MIC*    AMPICILLIN >=32 RESISTANT Resistant     CEFEPIME <=0.12 SENSITIVE Sensitive     CEFTAZIDIME <=1 SENSITIVE Sensitive     CEFTRIAXONE <=0.25 SENSITIVE Sensitive     CIPROFLOXACIN <=0.25 SENSITIVE Sensitive     GENTAMICIN <=1 SENSITIVE Sensitive     IMIPENEM <=0.25 SENSITIVE Sensitive     TRIMETH/SULFA <=20 SENSITIVE Sensitive     AMPICILLIN/SULBACTAM 4 SENSITIVE Sensitive     PIP/TAZO <=4 SENSITIVE Sensitive ug/mL    * RARE KLEBSIELLA PNEUMONIAE      Radiology Studies: CT ABDOMEN PELVIS W CONTRAST Result Date: 04/16/2023 CLINICAL DATA:  Postop abdominal pain, newly diagnosed lymphoma EXAM: CT ABDOMEN AND  PELVIS WITH CONTRAST TECHNIQUE: Multidetector CT imaging of the abdomen and pelvis was performed using the standard protocol following bolus administration of intravenous contrast. RADIATION DOSE REDUCTION: This exam was performed according to the departmental dose-optimization program which includes automated exposure control, adjustment of the mA and/or kV according to patient size and/or use of iterative reconstruction technique. CONTRAST:  75mL OMNIPAQUE IOHEXOL 350 MG/ML SOLN COMPARISON:  04/10/2023 FINDINGS: Lower chest: Mild bibasilar atelectasis. Hepatobiliary: Liver is within normal limits. Gallbladder is unremarkable. No intrahepatic or extrahepatic ductal dilatation. Pancreas: Within normal limits Spleen: Within normal limits Adrenals/Urinary Tract: Adrenal glands are within normal limits. Bilateral simple renal cysts, measuring up to 3.6 cm in the right lower kidney (series 3/image 49), benign (Bosniak I). 3 mm nonobstructing left lower pole renal calculus (series 3/image 41). No hydronephrosis. Bladder is mildly thick-walled although underdistended. Stomach/Bowel: Stomach is within normal limits. No evidence of bowel obstruction. Suspected subtotal small bowel/mesenteric mass resection, with residual abnormal soft tissue in the left mid abdomen (series 3/images 53, 59, and 69). Central necrosis with mesenteric fluid/gas (  series 3/image 60). Adjacent left mid abdominal drain (series 3/image 83) with terminates in the right lower abdomen (series 3/image 68). Suspected appendix is normal (series 3/image 84). No colonic wall thickening or inflammatory changes. Vascular/Lymphatic: No evidence of abdominal aortic aneurysm. Atherosclerotic calcifications of the abdominal aorta and branch vessels, although vessels remain patent. No suspicious abdominopelvic lymphadenopathy. Reproductive: Prostatomegaly, suggesting BPH. Other: Additional small mesenteric fluid collection beneath the anterior abdominal wall  measuring approximately 2.7 x 5.4 cm (series 3/image 55), postsurgical. No free air. Musculoskeletal: Postsurgical changes along the midline anterior abdominal wall. Degenerative changes of the visualized thoracolumbar spine. IMPRESSION: Suspected subtotal small bowel/mesenteric mass resection, with residual abnormal soft tissue in the left mid abdomen, as above. This likely corresponds to the patient's known T-cell lymphoma. Associated central necrosis with mesenteric fluid/gas. Adjacent left mid abdominal drain with terminates in the right lower abdomen. Additional small mesenteric fluid collection beneath the anterior abdominal wall, postsurgical. Additional ancillary findings as above. Electronically Signed   By: Charline Bills M.D.   On: 04/16/2023 20:48      LOS: 7 days    Jacquelin Hawking, MD [facility] 04/17/2023, 10:05 AM   If 7PM-7AM, please contact night-coverage [email or web address]

## 2023-04-17 NOTE — Plan of Care (Signed)
  Problem: Fluid Volume: Goal: Ability to maintain a balanced intake and output will improve Outcome: Progressing   Problem: Health Behavior/Discharge Planning: Goal: Ability to manage health-related needs will improve Outcome: Progressing   Problem: Metabolic: Goal: Ability to maintain appropriate glucose levels will improve Outcome: Progressing   Problem: Nutritional: Goal: Maintenance of adequate nutrition will improve Outcome: Progressing   Problem: Skin Integrity: Goal: Risk for impaired skin integrity will decrease Outcome: Progressing   Problem: Tissue Perfusion: Goal: Adequacy of tissue perfusion will improve Outcome: Progressing

## 2023-04-17 NOTE — Progress Notes (Signed)
PT Cancellation Note  Patient Details Name: Elijah [name] MRN: [id number] DOB: 26-Feb-1956   Cancelled Treatment:    Reason Eval/Treat Not Completed: Patient declined, stating he wanted to rest before d/c home. Acute PT to follow as able.  [name], PT, DPT Secure Chat Preferred  Rehab Office [phone]    Arturo Morton Brion Aliment 04/17/2023, 11:42 AM

## 2023-04-17 NOTE — Consult Note (Addendum)
WOC Nurse Consult Note: Reason for Consult: Apply wound VAC on the abdomen Wound type: Surgical wound dehiscence. Pressure Injury POA: NA Measurement: 20cmx 3cm x4cm Wound bed: 100% red Drainage (amount, consistency, odor) medium amount, 300 ml today 1200. Periwound: intact Pt asked to PO pain medication before the VAC change.   VAC: Removed old NPWT dressing Cleansed wound with normal saline Periwound skin protected with skin barrier wipe or window framed with drape Skin protected to the hip with VAC drape for foam bridge  Filled wound with 1 piece of black foam  Sealed NPWT dressing at HG/164mmHG  Patient did not received IV/PO pain medication per bedside nurse prior to dressing change. Patient tolerated procedure well, refer more pain on the upper part of the wound.  Pt will be discharge today, the home machine was without the canister. Changed the VAC and turn on the portable hospital machine.   Sent it a secure chat to the nurse requesting unplug the dressing and apply to the new machine when the canister comes.   NPWT dressing Mon/Thurs   WOC nurse will continue to provide NPWT dressing changed.   Thank-you,  Denyse Amass BSN, RN, [employer], WOC  (Pager: [phone])

## 2023-04-17 NOTE — Progress Notes (Signed)
OT Cancellation Note  Patient Details Name: Elijah [name] MRN: [id number] DOB: 10-31-1955   Cancelled Treatment:    Reason Eval/Treat Not Completed: Pain limiting ability to participate (Patient declined OT on this dated due to abdominal and right shoulder pain. Asked to reschedule for another day. OT to reattempt tomorrow) Alfonse Flavors, OTA Acute Rehabilitation Services  Office [phone]  Dewain Penning 04/17/2023, 8:43 AM

## 2023-04-17 NOTE — Progress Notes (Addendum)
Progress Note  7 Days Post-Op  Subjective: Pt passing flatus and tolerating diet. Pain is well controlled. Binder present. Denies any complaints at present.  Yesterday did have some malaise and was feeling pretty rough by the end of the day.  All that has resolved again today.  CT was completed 04/16/2023 showing interval resection of the small bowel mass with residual abnormal soft tissue.  Associated central necrosis with mesenteric fluid/gas.  Drain does appear to goes right through all this.  Objective: Vital signs in last 24 hours: Temp:  [98.1 F (36.7 C)-99.3 F (37.4 C)] 98.4 F (36.9 C) (01/16 0824) Pulse Rate:  [64-93] 70 (01/16 0824) Resp:  [14-18] 14 (01/16 0824) BP: (140-160)/(63-93) 140/78 (01/16 0827) SpO2:  [94 %-99 %] 97 % (01/16 0824) Weight:  [161 kg] 107 kg (01/16 0354) Last BM Date : 04/16/23  Intake/Output from previous day: 01/15 0701 - 01/16 0700 In: 1980 [P.O.:1480; IV Piggyback:500] Out: 770 [Urine:600; Drains:170] Intake/Output this shift: Total I/O In: -  Out: 145 [Drains:145]  PE: General: pleasant, WD, WN male who is up in bed in NAD Heart: rrr.  Lungs: Respiratory effort nonlabored Abd: soft, appropriately ttp, midline wound as below, JP with scant thin brown fluid. Wound VAC in place, serosanguinous output  Psych: A&Ox3 with an appropriate affect.    Lab Results:  Recent Labs    04/15/23 0603  WBC 13.4*  HGB 10.6*  HCT 32.7*  PLT 279   BMET Recent Labs    04/15/23 0603 04/17/23 0548  NA 141  --   K 3.7  --   CL 106  --   CO2 27  --   GLUCOSE 172*  --   BUN 13  --   CREATININE 1.22 1.27*  CALCIUM 8.7*  --    PT/INR No results for input(s): "LABPROT", "INR" in the last 72 hours. CMP     Component Value Date/Time   NA 141 04/15/2023 0603   K 3.7 04/15/2023 0603   CL 106 04/15/2023 0603   CO2 27 04/15/2023 0603   GLUCOSE 172 (H) 04/15/2023 0603   BUN 13 04/15/2023 0603   CREATININE 1.27 (H) 04/17/2023 0548    CALCIUM 8.7 (L) 04/15/2023 0603   PROT 6.9 04/10/2023 0629   ALBUMIN 4.0 04/10/2023 0629   AST 18 04/10/2023 0629   ALT 23 04/10/2023 0629   ALKPHOS 61 04/10/2023 0629   BILITOT 0.4 04/10/2023 0629   GFRNONAA >60 04/17/2023 0548   GFRAA  06/17/2007 2140    >60        The eGFR has been calculated using the MDRD equation. This calculation has not been validated in all clinical   Lipase     Component Value Date/Time   LIPASE 11 04/10/2023 0629       Studies/Results: CT ABDOMEN PELVIS W CONTRAST Result Date: 04/16/2023 CLINICAL DATA:  Postop abdominal pain, newly diagnosed lymphoma EXAM: CT ABDOMEN AND PELVIS WITH CONTRAST TECHNIQUE: Multidetector CT imaging of the abdomen and pelvis was performed using the standard protocol following bolus administration of intravenous contrast. RADIATION DOSE REDUCTION: This exam was performed according to the departmental dose-optimization program which includes automated exposure control, adjustment of the mA and/or kV according to patient size and/or use of iterative reconstruction technique. CONTRAST:  75mL OMNIPAQUE IOHEXOL 350 MG/ML SOLN COMPARISON:  04/10/2023 FINDINGS: Lower chest: Mild bibasilar atelectasis. Hepatobiliary: Liver is within normal limits. Gallbladder is unremarkable. No intrahepatic or extrahepatic ductal dilatation. Pancreas: Within normal limits Spleen: Within  normal limits Adrenals/Urinary Tract: Adrenal glands are within normal limits. Bilateral simple renal cysts, measuring up to 3.6 cm in the right lower kidney (series 3/image 49), benign (Bosniak I). 3 mm nonobstructing left lower pole renal calculus (series 3/image 41). No hydronephrosis. Bladder is mildly thick-walled although underdistended. Stomach/Bowel: Stomach is within normal limits. No evidence of bowel obstruction. Suspected subtotal small bowel/mesenteric mass resection, with residual abnormal soft tissue in the left mid abdomen (series 3/images 53, 59, and 69).  Central necrosis with mesenteric fluid/gas (series 3/image 60). Adjacent left mid abdominal drain (series 3/image 83) with terminates in the right lower abdomen (series 3/image 68). Suspected appendix is normal (series 3/image 84). No colonic wall thickening or inflammatory changes. Vascular/Lymphatic: No evidence of abdominal aortic aneurysm. Atherosclerotic calcifications of the abdominal aorta and branch vessels, although vessels remain patent. No suspicious abdominopelvic lymphadenopathy. Reproductive: Prostatomegaly, suggesting BPH. Other: Additional small mesenteric fluid collection beneath the anterior abdominal wall measuring approximately 2.7 x 5.4 cm (series 3/image 55), postsurgical. No free air. Musculoskeletal: Postsurgical changes along the midline anterior abdominal wall. Degenerative changes of the visualized thoracolumbar spine. IMPRESSION: Suspected subtotal small bowel/mesenteric mass resection, with residual abnormal soft tissue in the left mid abdomen, as above. This likely corresponds to the patient's known T-cell lymphoma. Associated central necrosis with mesenteric fluid/gas. Adjacent left mid abdominal drain with terminates in the right lower abdomen. Additional small mesenteric fluid collection beneath the anterior abdominal wall, postsurgical. Additional ancillary findings as above. Electronically Signed   By: Charline Bills M.D.   On: 04/16/2023 20:48    Anti-infectives: Anti-infectives (From admission, onward)    Start     Dose/Rate Route Frequency Ordered Stop   04/10/23 2200  piperacillin-tazobactam (ZOSYN) IVPB 3.375 g        3.375 g 12.5 mL/hr over 240 Minutes Intravenous Every 8 hours 04/10/23 2101 04/15/23 0131   04/10/23 1000  piperacillin-tazobactam (ZOSYN) IVPB 3.375 g        3.375 g 100 mL/hr over 30 Minutes Intravenous  Once 04/10/23 0948 04/10/23 1205        Assessment/Plan   Small bowel mass with perforation  POD#7- exploratory laparotomy, small bowel  resection 1/9 Dr. Hillery Hunter -  AFVSS,  -  Path A. 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   This was all reviewed by Dr. Hillery Hunter with him and his wife over the phone yesterday evening.  We discussed that again today.  His case has been submitted for multidisciplinary tumor board.  Oncology referrals have also been placed by our office and are currently in the works.     - Tolerating soft - VAC - Cr 1.22 - avoid nephrotoxic agents, stable  - Abdominal binder ordered for when OOB  FEN: Soft VTE: LMWH ID: Zosyn 1/9>1/13 We will also go ahead and prescribe oral antibiotics with a 14-day course as an outpatient.  Polymyalgia Rheumatica - steroids per [facility], appreciate assistance  T2DM - SSI GERD Asthma BPH  Dispo: Home health for new wound VAC.  He and his wife were instructed on general expectations after surgery.  We have asked our office to place referrals to medical oncology as an outpatient given the at least prelim pathology discussed over the phone with her pathologist.  Final pathology is currently pending.  We will arrange for follow-up with Dr. Hillery Hunter as well.  He and his wife are both comfortable with and stable for  discharge home today on oral antibiotics given CT findings   LOS: 7 days   Marin Olp, MD [facility], [facility]

## 2023-04-18 ENCOUNTER — Other Ambulatory Visit (HOSPITAL_COMMUNITY): Payer: Self-pay

## 2023-04-18 ENCOUNTER — Other Ambulatory Visit: Payer: Self-pay

## 2023-04-21 ENCOUNTER — Other Ambulatory Visit (HOSPITAL_COMMUNITY): Payer: Self-pay

## 2023-04-22 ENCOUNTER — Other Ambulatory Visit (HOSPITAL_COMMUNITY): Payer: Self-pay

## 2023-04-22 NOTE — Progress Notes (Unsigned)
[facility] [phone]   [phone]  INITIAL CONSULT NOTE  Patient Care Team: Tally Joe, MD as PCP - General (Family Medicine) Lyn Records, MD (Inactive) as PCP - Cardiology (Cardiology)  Hematological/Oncological History 04/10/2023:Presented to ED with acute abdominal pain and nausea.  CT abdomen/pelvis showed shows a mass in the left abdomen associated with the small bowel, along with pneumoperitoneum. Underwent exploratory laparotomy and small bowel resection. 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  04/23/2023: Established care with [facility] Hematology/Oncology.  CHIEF COMPLAINTS/PURPOSE OF CONSULTATION:  "*** "  HISTORY OF PRESENTING ILLNESS:  Elijah [name] 68 y.o. male with medical history significant for ***  On review of the previous records ***  On exam today ***  MEDICAL HISTORY:  Past Medical History:  Diagnosis Date   Acid reflux    Allergic rhinitis    Asthma    Bloating    BPH (benign prostatic hyperplasia)    Depression    ED (erectile dysfunction)    Epigastric pain    Exercise-induced asthma    GERD (gastroesophageal reflux disease)    History of kidney stones    Hypercholesteremia    Migraine 2009   Polymyalgia rheumatica (HCC)    Prediabetes    Prostate disease    Sleep apnea     SURGICAL HISTORY: Past Surgical History:  Procedure Laterality Date   ANKLE SURGERY Right    BOWEL RESECTION N/A 04/10/2023   Procedure: SMALL BOWEL RESECTION WITH MASS;  Surgeon: Moise Boring, MD;  Location: [facility] OR;  Service: General;  Laterality: N/A;   CHEST TUBE INSERTION Left    COLONOSCOPY     CYSTOSCOPY WITH RETROGRADE PYELOGRAM, URETEROSCOPY AND STENT PLACEMENT Left 06/29/2020   Procedure: CYSTOSCOPY WITH RETROGRADE PYELOGRAM, URETEROSCOPY , BASKET EXTRACTION, AND STENT PLACEMENT;  Surgeon:  Sebastian Ache, MD;  Location: [facility] ORS;  Service: Urology;  Laterality: Left;  1 HR   KNEE SURGERY Right    LAPAROTOMY N/A 04/10/2023   Procedure: EXPLORATORY LAPAROTOMY;  Surgeon: Moise Boring, MD;  Location: [facility] OR;  Service: General;  Laterality: N/A;   SKIN CANCER EXCISION     TONSILLECTOMY     VASECTOMY     WISDOM TOOTH EXTRACTION      SOCIAL HISTORY: Social History   Socioeconomic History   Marital status: Married    Spouse name: Not on file   Number of children: Not on file   Years of education: Not on file   Highest education level: Not on file  Occupational History   Not on file  Tobacco Use   Smoking status: Never   Smokeless tobacco: Never  Vaping Use   Vaping status: Never Used  Substance and Sexual Activity   Alcohol use: Yes    Comment: social   Drug use: Never   Sexual activity: Not on file  Other Topics Concern   Not on file  Social History Narrative   Not on file   Social Drivers of Health   Financial Resource Strain: Not on file  Food Insecurity: No Food Insecurity (04/10/2023)   Hunger Vital Sign    Worried About Running Out of Food in the Last Year: Never true    Ran Out of Food in the Last Year: Never true  Transportation Needs: No Transportation Needs (04/10/2023)   PRAPARE - Administrator, Civil Service (Medical): No    Lack of Transportation (  Non-Medical): No  Physical Activity: Not on file  Stress: Not on file  Social Connections: Not on file  Intimate Partner Violence: Not At Risk (04/10/2023)   Humiliation, Afraid, Rape, and Kick questionnaire    Fear of Current or Ex-Partner: No    Emotionally Abused: No    Physically Abused: No    Sexually Abused: No    FAMILY HISTORY: No family history on file.  ALLERGIES:  is allergic to singulair [montelukast], amiodarone, and crestor [rosuvastatin].  MEDICATIONS:  Current Outpatient Medications  Medication Sig Dispense Refill   acetaminophen (TYLENOL) 325 MG tablet Take 2  tablets (650 mg total) by mouth every 6 (six) hours as needed.     albuterol (VENTOLIN HFA) 108 (90 Base) MCG/ACT inhaler Inhale 2 puffs into the lungs as needed (max of 12 puffs per day) 6.7 g 0   alfuzosin (UROXATRAL) 10 MG 24 hr tablet Take 1 tablet (10 mg total) by mouth daily. 90 tablet 1   amoxicillin-clavulanate (AUGMENTIN) 875-125 MG tablet Take 1 tablet by mouth 2 (two) times daily for 14 days. 28 tablet 0   b complex vitamins capsule Take 1 capsule by mouth daily.     BLACK ELDERBERRY PO Take 1,150 mg by mouth daily.     Blood Glucose Monitoring Suppl (ACCU-CHEK GUIDE) w/Device KIT Use daily before breakfast. 1 kit 0   CINNAMON PO Take 1 capsule by mouth daily.     famotidine (PEPCID) 20 MG tablet Take 20 mg by mouth 2 (two) times daily as needed for heartburn or indigestion. Patient only take 1 tablet in the am.     finasteride (PROSCAR) 5 MG tablet Take 1 tablet (5 mg total) by mouth daily. 90 tablet 3   Glucose Blood (BLOOD GLUCOSE TEST STRIPS) STRP Use to check blood sugar before breakfast as directed 100 strip 0   ibuprofen (ADVIL) 200 MG tablet Take 400 mg by mouth once as needed for moderate pain (pain score 4-6).     glucose blood (ACCU-CHEK GUIDE TEST) test strip Use daily before breakfast. 50 each 0   Accu-Chek Softclix Lancets lancets Use daily before breakfast. 100 each 0   metFORMIN (GLUCOPHAGE) 850 MG tablet Take 1 tablet (850 mg total) by mouth daily with breakfast for 7 days, THEN 1 tablet (850 mg total) 2 (two) times daily with a meal. Increase from daily to twice daily if no/minimal GI symptoms). 67 tablet 0   methocarbamol (ROBAXIN) 500 MG tablet Take 2 tablets (1,000 mg total) by mouth every 8 (eight) hours as needed for muscle spasms. 60 tablet 1   minoxidil (LONITEN) 2.5 MG tablet Take 1 tablet (2.5 mg total) by mouth daily. 365 tablet 1   naproxen (NAPROSYN) 500 MG tablet Take 1 tablet by mouth twice a day 60 tablet 0   oxyCODONE (OXY IR/ROXICODONE) 5 MG immediate  release tablet Take 1-2 tablets (5-10 mg total) by mouth every 4 (four) hours as needed (pain). 30 tablet 0   predniSONE (DELTASONE) 5 MG tablet Take 4.5 tablets (22.5 mg total) by mouth daily.     Probiotic TBEC Take 1 capsule by mouth daily.     sildenafil (REVATIO) 20 MG tablet Take 2-5 tablets (40-100 mg total) by mouth as directed as needed for sexual funtion. 90 tablet 0   tadalafil, PAH, (ADCIRCA) 20 MG tablet Take 1 tablet (20 mg total) by mouth every 3 (three) days as needed for sexual activity 20 tablet 5   No current facility-administered medications for this  visit.    REVIEW OF SYSTEMS:   Constitutional: ( - ) fevers, ( - )  chills , ( - ) night sweats Eyes: ( - ) blurriness of vision, ( - ) double vision, ( - ) watery eyes Ears, nose, mouth, throat, and face: ( - ) mucositis, ( - ) sore throat Respiratory: ( - ) cough, ( - ) dyspnea, ( - ) wheezes Cardiovascular: ( - ) palpitation, ( - ) chest discomfort, ( - ) lower extremity swelling Gastrointestinal:  ( - ) nausea, ( - ) heartburn, ( - ) change in bowel habits Skin: ( - ) abnormal skin rashes Lymphatics: ( - ) new lymphadenopathy, ( - ) easy bruising Neurological: ( - ) numbness, ( - ) tingling, ( - ) new weaknesses Behavioral/Psych: ( - ) mood change, ( - ) new changes  All other systems were reviewed with the patient and are negative.  PHYSICAL EXAMINATION: ECOG PERFORMANCE STATUS: {CHL ONC ECOG PS:[id number]}  There were no vitals filed for this visit. There were no vitals filed for this visit.  GENERAL: well appearing *** in NAD  SKIN: skin color, texture, turgor are normal, no rashes or significant lesions EYES: conjunctiva are pink and non-injected, sclera clear OROPHARYNX: no exudate, no erythema; lips, buccal mucosa, and tongue normal  NECK: supple, non-tender LYMPH:  no palpable lymphadenopathy in the cervical, axillary or supraclavicular lymph nodes.  LUNGS: clear to auscultation and percussion with normal  breathing effort HEART: regular rate & rhythm and no murmurs and no lower extremity edema ABDOMEN: soft, non-tender, non-distended, normal bowel sounds Musculoskeletal: no cyanosis of digits and no clubbing  PSYCH: alert & oriented x 3, fluent speech NEURO: no focal motor/sensory deficits  LABORATORY DATA:  I have reviewed the data as listed    Latest Ref Rng & Units 04/15/2023    6:03 AM 04/14/2023    4:55 AM 04/13/2023    5:47 AM  CBC  WBC 4.0 - 10.5 K/uL 13.4  12.8  13.5   Hemoglobin 13.0 - 17.0 g/dL 16.1  09.6  04.5   Hematocrit 39.0 - 52.0 % 32.7  32.6  34.3   Platelets 150 - 400 K/uL 279  255  239        Latest Ref Rng & Units 04/17/2023    5:48 AM 04/15/2023    6:03 AM 04/14/2023    4:55 AM  CMP  Glucose 70 - 99 mg/dL  409  811   BUN 8 - 23 mg/dL  13  18   Creatinine 9.14 - 1.24 mg/dL 7.82  9.56  2.13   Sodium 135 - 145 mmol/L  141  142   Potassium 3.5 - 5.1 mmol/L  3.7  3.2   Chloride 98 - 111 mmol/L  106  103   CO2 22 - 32 mmol/L  27  24   Calcium 8.9 - 10.3 mg/dL  8.7  8.5      PATHOLOGY: ***  BLOOD FILM: *** Review of the peripheral blood smear showed normal appearing white cells with neutrophils that were appropriately lobated and granulated. There was no predominance of bi-lobed or hyper-segmented neutrophils appreciated. No Dohle bodies were noted. There was no left shifting, immature forms or blasts noted. Lymphocytes remain normal in size without any predominance of large granular lymphocytes. Red cells show no anisopoikilocytosis, macrocytes , microcytes or polychromasia. There were no schistocytes, target cells, echinocytes, acanthocytes, dacrocytes, or stomatocytes.There was no rouleaux formation, nucleated red cells, or intra-cellular inclusions noted.  The platelets are normal in size, shape, and color without any clumping evident.  RADIOGRAPHIC STUDIES: I have personally reviewed the radiological images as listed and agreed with the findings in the  report. CT ABDOMEN PELVIS W CONTRAST Result Date: 04/16/2023 CLINICAL DATA:  Postop abdominal pain, newly diagnosed lymphoma EXAM: CT ABDOMEN AND PELVIS WITH CONTRAST TECHNIQUE: Multidetector CT imaging of the abdomen and pelvis was performed using the standard protocol following bolus administration of intravenous contrast. RADIATION DOSE REDUCTION: This exam was performed according to the departmental dose-optimization program which includes automated exposure control, adjustment of the mA and/or kV according to patient size and/or use of iterative reconstruction technique. CONTRAST:  75mL OMNIPAQUE IOHEXOL 350 MG/ML SOLN COMPARISON:  04/10/2023 FINDINGS: Lower chest: Mild bibasilar atelectasis. Hepatobiliary: Liver is within normal limits. Gallbladder is unremarkable. No intrahepatic or extrahepatic ductal dilatation. Pancreas: Within normal limits Spleen: Within normal limits Adrenals/Urinary Tract: Adrenal glands are within normal limits. Bilateral simple renal cysts, measuring up to 3.6 cm in the right lower kidney (series 3/image 49), benign (Bosniak I). 3 mm nonobstructing left lower pole renal calculus (series 3/image 41). No hydronephrosis. Bladder is mildly thick-walled although underdistended. Stomach/Bowel: Stomach is within normal limits. No evidence of bowel obstruction. Suspected subtotal small bowel/mesenteric mass resection, with residual abnormal soft tissue in the left mid abdomen (series 3/images 53, 59, and 69). Central necrosis with mesenteric fluid/gas (series 3/image 60). Adjacent left mid abdominal drain (series 3/image 83) with terminates in the right lower abdomen (series 3/image 68). Suspected appendix is normal (series 3/image 84). No colonic wall thickening or inflammatory changes. Vascular/Lymphatic: No evidence of abdominal aortic aneurysm. Atherosclerotic calcifications of the abdominal aorta and branch vessels, although vessels remain patent. No suspicious abdominopelvic  lymphadenopathy. Reproductive: Prostatomegaly, suggesting BPH. Other: Additional small mesenteric fluid collection beneath the anterior abdominal wall measuring approximately 2.7 x 5.4 cm (series 3/image 55), postsurgical. No free air. Musculoskeletal: Postsurgical changes along the midline anterior abdominal wall. Degenerative changes of the visualized thoracolumbar spine. IMPRESSION: Suspected subtotal small bowel/mesenteric mass resection, with residual abnormal soft tissue in the left mid abdomen, as above. This likely corresponds to the patient's known T-cell lymphoma. Associated central necrosis with mesenteric fluid/gas. Adjacent left mid abdominal drain with terminates in the right lower abdomen. Additional small mesenteric fluid collection beneath the anterior abdominal wall, postsurgical. Additional ancillary findings as above. Electronically Signed   By: Charline Bills M.D.   On: 04/16/2023 20:48   DG Abd Portable 1 View Result Date: 04/10/2023 CLINICAL DATA:  68 year old male NG tube placement. EXAM: PORTABLE ABDOMEN - 1 VIEW COMPARISON:  CT Abdomen and Pelvis 0852 hours today. FINDINGS: Supine AP view at 1053 hours. Enteric tube tip has been placed into the stomach. The side hole is not apparent. Lung bases and bowel gas pattern appears stable, pneumoperitoneum more evident by CT. IMPRESSION: Enteric tube tip placed into the proximal stomach. Side hole not identified, consider advancement of several cm to ensure side hole placement within the stomach. Electronically Signed   By: Odessa Fleming M.D.   On: 04/10/2023 11:08   CT ABDOMEN PELVIS W CONTRAST Result Date: 04/10/2023 CLINICAL DATA:  Left lower quadrant abdominal pain.  Dry heaves. EXAM: CT ABDOMEN AND PELVIS WITH CONTRAST TECHNIQUE: Multidetector CT imaging of the abdomen and pelvis was performed using the standard protocol following bolus administration of intravenous contrast. RADIATION DOSE REDUCTION: This exam was performed according to the  departmental dose-optimization program which includes automated exposure control, adjustment of the mA and/or kV  according to patient size and/or use of iterative reconstruction technique. CONTRAST:  OMNIPAQUE IOHEXOL 300 MG/ML  SOLN COMPARISON:  12/07/2019 FINDINGS: Lower chest: Dependent atelectasis. Hepatobiliary: No suspicious focal abnormality within the liver parenchyma. There is no evidence for gallstones, gallbladder wall thickening, or pericholecystic fluid. No intrahepatic or extrahepatic biliary dilation. Pancreas: No focal mass lesion. No dilatation of the main duct. No intraparenchymal cyst. No peripancreatic edema. Spleen: No splenomegaly. No suspicious focal mass lesion. Adrenals/Urinary Tract: No adrenal nodule or mass. Small bilateral renal cysts noted. No followup imaging is recommended. 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 No followup imaging is recommended. No evidence for hydroureter. Mild circumferential bladder wall thickening likely accentuated by underdistention. Stomach/Bowel: Stomach is unremarkable. No gastric wall thickening. No evidence of outlet obstruction. Duodenum is normally positioned as is the ligament of Treitz. 13.9 x 9.9 x 12.4 cm mass lesion is identified in the left abdomen. This lesion contains central fluid, debris and gas and is contiguous with the lumen of the small bowel. The terminal ileum is normal. The appendix is normal. Colon is nondilated. There is some mild wall thickening in the descending colon is a tracks along the small bowel mass, likely secondary. Vascular/Lymphatic: There is mild atherosclerotic calcification of the abdominal aorta without aneurysm. There is no gastrohepatic or hepatoduodenal ligament lymphadenopathy. No retroperitoneal or mesenteric lymphadenopathy. No pelvic sidewall lymphadenopathy. Reproductive: Prostate gland is mildly enlarged. Other: Small volume ascites. Moderate volume intraperitoneal free  gas. Musculoskeletal: No worrisome lytic or sclerotic osseous abnormality. IMPRESSION: 1. 13.9 x 9.9 x 12.4 cm mass lesion in the left abdomen containing central fluid, debris and gas and is contiguous with the lumen of the small bowel. 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 The presence of associated intraperitoneal free gas is consistent with perforation of the lesion/involved small bowel segment. 2. Small volume ascites. 3. Mild wall thickening in the descending colon is a tracks along the small bowel mass, likely secondary. 4.  Aortic Atherosclerosis (ICD10-I70.0). Critical Value/emergent results were called by telephone at the time of interpretation on 04/10/2023 at 9:38 am to provider Dr. Theresia Lo, who verbally acknowledged these results. Electronically Signed   By: Kennith Center M.D.   On: 04/10/2023 09:38    ASSESSMENT & PLAN ***  No orders of the defined types were placed in this encounter.   All questions were answered. The patient knows to call the clinic with any problems, questions or concerns.  I have spent a total of {CHL ONC TIME VISIT - ZOXWR:[id number]} minutes of face-to-face and non-face-to-face time, preparing to see the patient, obtaining and/or reviewing separately obtained history, performing a medically appropriate examination, counseling and educating the patient, ordering medications/tests/procedures, referring and communicating with other health care professionals, documenting clinical information in the electronic health record, independently interpreting results and communicating results to the patient, and care coordination.   Georga Kaufmann, PA-C Department of Hematology/Oncology [facility] Phone: [phone]

## 2023-04-23 ENCOUNTER — Other Ambulatory Visit: Payer: Self-pay | Admitting: *Deleted

## 2023-04-23 ENCOUNTER — Encounter: Payer: Self-pay | Admitting: Rheumatology

## 2023-04-23 ENCOUNTER — Encounter: Payer: Self-pay | Admitting: Physician Assistant

## 2023-04-23 ENCOUNTER — Inpatient Hospital Stay: Payer: 59

## 2023-04-23 ENCOUNTER — Inpatient Hospital Stay: Payer: 59 | Attending: Physician Assistant | Admitting: Physician Assistant

## 2023-04-23 ENCOUNTER — Ambulatory Visit: Payer: 59 | Attending: Rheumatology | Admitting: Rheumatology

## 2023-04-23 ENCOUNTER — Other Ambulatory Visit (HOSPITAL_COMMUNITY): Payer: Self-pay

## 2023-04-23 ENCOUNTER — Other Ambulatory Visit: Payer: Self-pay

## 2023-04-23 VITALS — BP 168/96 | HR 82 | Resp 16 | Ht 73.0 in | Wt 236.6 lb

## 2023-04-23 VITALS — BP 155/81 | HR 81 | Temp 97.7°F | Resp 18 | Wt 236.5 lb

## 2023-04-23 DIAGNOSIS — R7303 Prediabetes: Secondary | ICD-10-CM | POA: Diagnosis not present

## 2023-04-23 DIAGNOSIS — Z79899 Other long term (current) drug therapy: Secondary | ICD-10-CM | POA: Insufficient documentation

## 2023-04-23 DIAGNOSIS — F322 Major depressive disorder, single episode, severe without psychotic features: Secondary | ICD-10-CM

## 2023-04-23 DIAGNOSIS — M25511 Pain in right shoulder: Secondary | ICD-10-CM

## 2023-04-23 DIAGNOSIS — M353 Polymyalgia rheumatica: Secondary | ICD-10-CM | POA: Insufficient documentation

## 2023-04-23 DIAGNOSIS — C8449 Peripheral T-cell lymphoma, not classified, extranodal and solid organ sites: Secondary | ICD-10-CM

## 2023-04-23 DIAGNOSIS — E7800 Pure hypercholesterolemia, unspecified: Secondary | ICD-10-CM

## 2023-04-23 DIAGNOSIS — G8929 Other chronic pain: Secondary | ICD-10-CM

## 2023-04-23 DIAGNOSIS — C862 Enteropathy-type (intestinal) T-cell lymphoma not having achieved remission: Secondary | ICD-10-CM | POA: Diagnosis present

## 2023-04-23 DIAGNOSIS — M25552 Pain in left hip: Secondary | ICD-10-CM

## 2023-04-23 DIAGNOSIS — J3089 Other allergic rhinitis: Secondary | ICD-10-CM

## 2023-04-23 DIAGNOSIS — R700 Elevated erythrocyte sedimentation rate: Secondary | ICD-10-CM

## 2023-04-23 DIAGNOSIS — I7300 Raynaud's syndrome without gangrene: Secondary | ICD-10-CM

## 2023-04-23 DIAGNOSIS — G4733 Obstructive sleep apnea (adult) (pediatric): Secondary | ICD-10-CM

## 2023-04-23 DIAGNOSIS — M25512 Pain in left shoulder: Secondary | ICD-10-CM

## 2023-04-23 DIAGNOSIS — K219 Gastro-esophageal reflux disease without esophagitis: Secondary | ICD-10-CM | POA: Insufficient documentation

## 2023-04-23 DIAGNOSIS — R5383 Other fatigue: Secondary | ICD-10-CM

## 2023-04-23 DIAGNOSIS — N400 Enlarged prostate without lower urinary tract symptoms: Secondary | ICD-10-CM

## 2023-04-23 DIAGNOSIS — R109 Unspecified abdominal pain: Secondary | ICD-10-CM | POA: Insufficient documentation

## 2023-04-23 DIAGNOSIS — J45990 Exercise induced bronchospasm: Secondary | ICD-10-CM

## 2023-04-23 DIAGNOSIS — K631 Perforation of intestine (nontraumatic): Secondary | ICD-10-CM

## 2023-04-23 DIAGNOSIS — M791 Myalgia, unspecified site: Secondary | ICD-10-CM

## 2023-04-23 DIAGNOSIS — M25551 Pain in right hip: Secondary | ICD-10-CM | POA: Diagnosis not present

## 2023-04-23 DIAGNOSIS — R7309 Other abnormal glucose: Secondary | ICD-10-CM

## 2023-04-23 DIAGNOSIS — R400 Somnolence: Secondary | ICD-10-CM

## 2023-04-23 DIAGNOSIS — Z7952 Long term (current) use of systemic steroids: Secondary | ICD-10-CM

## 2023-04-23 DIAGNOSIS — N200 Calculus of kidney: Secondary | ICD-10-CM

## 2023-04-23 LAB — CMP (CANCER CENTER ONLY)
ALT: 27 U/L (ref 0–44)
AST: 16 U/L (ref 15–41)
Albumin: 3.8 g/dL (ref 3.5–5.0)
Alkaline Phosphatase: 74 U/L (ref 38–126)
Anion gap: 8 (ref 5–15)
BUN: 23 mg/dL (ref 8–23)
CO2: 30 mmol/L (ref 22–32)
Calcium: 9.8 mg/dL (ref 8.9–10.3)
Chloride: 99 mmol/L (ref 98–111)
Creatinine: 1.27 mg/dL — ABNORMAL HIGH (ref 0.61–1.24)
GFR, Estimated: >60 mL/min (ref >60)
Glucose, Bld: 203 mg/dL — ABNORMAL HIGH (ref 70–99)
Potassium: 4.2 mmol/L (ref 3.5–5.1)
Sodium: 137 mmol/L (ref 135–145)
Total Bilirubin: 0.3 mg/dL (ref 0.0–1.2)
Total Protein: 7.3 g/dL (ref 6.5–8.1)

## 2023-04-23 LAB — CBC WITH DIFFERENTIAL (CANCER CENTER ONLY)
Abs Immature Granulocytes: 0.3 10*3/uL — ABNORMAL HIGH (ref 0.00–0.07)
Basophils Absolute: 0.1 10*3/uL (ref 0.0–0.1)
Basophils Relative: 0 %
Eosinophils Absolute: 0.2 10*3/uL (ref 0.0–0.5)
Eosinophils Relative: 1 %
HCT: 38.4 % — ABNORMAL LOW (ref 39.0–52.0)
Hemoglobin: 12.3 g/dL — ABNORMAL LOW (ref 13.0–17.0)
Immature Granulocytes: 2 %
Lymphocytes Relative: 5 %
Lymphs Abs: 1 10*3/uL (ref 0.7–4.0)
MCH: 27.9 pg (ref 26.0–34.0)
MCHC: 32 g/dL (ref 30.0–36.0)
MCV: 87.1 fL (ref 80.0–100.0)
Monocytes Absolute: 0.9 10*3/uL (ref 0.1–1.0)
Monocytes Relative: 5 %
Neutro Abs: 15.8 10*3/uL — ABNORMAL HIGH (ref 1.7–7.7)
Neutrophils Relative %: 87 %
Platelet Count: 599 10*3/uL — ABNORMAL HIGH (ref 150–400)
RBC: 4.41 MIL/uL (ref 4.22–5.81)
RDW: 14.8 % (ref 11.5–15.5)
WBC Count: 18.3 10*3/uL — ABNORMAL HIGH (ref 4.0–10.5)
nRBC: 0 % (ref 0.0–0.2)

## 2023-04-23 LAB — LACTATE DEHYDROGENASE: LDH: 236 U/L — ABNORMAL HIGH (ref 98–192)

## 2023-04-23 MED ORDER — OXYCODONE HCL 5 MG PO TABS
5.0000 mg | ORAL_TABLET | ORAL | 0 refills | Status: DC | PRN
  Filled 2023-04-23: qty 20, 5d supply, fill #0

## 2023-04-23 MED ORDER — PREDNISONE 5 MG PO TABS
ORAL_TABLET | ORAL | 0 refills | Status: DC
  Filled 2023-04-23: qty 188, 73d supply, fill #0

## 2023-04-23 MED ORDER — OXYCODONE HCL 5 MG PO TABS
5.0000 mg | ORAL_TABLET | Freq: Four times a day (QID) | ORAL | 0 refills | Status: DC | PRN
  Filled 2023-04-23 – 2023-04-24 (×2): qty 180, 23d supply, fill #0

## 2023-04-23 NOTE — Progress Notes (Signed)
The proposed treatment discussed in conference is for discussion purpose only and is not a binding recommendation.  The patients have not been physically examined, or presented with their treatment options.  Therefore, final treatment plans cannot be decided.  

## 2023-04-23 NOTE — Patient Instructions (Addendum)
Decrease prednisone by 2.5 mg every 2 weeks until he reached prednisone 10 mg daily.  After that he will reduce prednisone by 1 mg every month.  Please schedule an appointment with Dr. [place] for possible skin biopsy to rule out a myopathic dermatomyositis.

## 2023-04-23 NOTE — Telephone Encounter (Signed)
Per Dr. Corliss Skains, Prednisone 20 mg po daily x 2 weeks, 15 mg po daily x2 weeks, 10 mg po daily x 1 month. Then taper by 1 mg every month.

## 2023-04-23 NOTE — Progress Notes (Signed)
Met patient during visit with Karena Addison and Dr Leonides Schanz, introduced myself and explained my role. Will be in touch with PET scan appointment and  available as needed.

## 2023-04-24 ENCOUNTER — Other Ambulatory Visit (HOSPITAL_COMMUNITY): Payer: Self-pay

## 2023-04-24 ENCOUNTER — Other Ambulatory Visit: Payer: Self-pay

## 2023-04-24 NOTE — Progress Notes (Signed)
TSH is elevated.  Please forward results to his PCP.  Most of the lab results are still pending.

## 2023-04-25 ENCOUNTER — Other Ambulatory Visit (HOSPITAL_COMMUNITY): Payer: Self-pay | Admitting: General Surgery

## 2023-04-25 ENCOUNTER — Other Ambulatory Visit: Payer: Self-pay | Admitting: Physician Assistant

## 2023-04-25 DIAGNOSIS — Z9049 Acquired absence of other specified parts of digestive tract: Secondary | ICD-10-CM

## 2023-04-25 DIAGNOSIS — C8449 Peripheral T-cell lymphoma, not classified, extranodal and solid organ sites: Secondary | ICD-10-CM

## 2023-04-25 DIAGNOSIS — Z08 Encounter for follow-up examination after completed treatment for malignant neoplasm: Secondary | ICD-10-CM

## 2023-04-28 ENCOUNTER — Other Ambulatory Visit (HOSPITAL_COMMUNITY): Payer: Self-pay

## 2023-04-28 ENCOUNTER — Other Ambulatory Visit: Payer: Self-pay | Admitting: General Surgery

## 2023-04-28 ENCOUNTER — Other Ambulatory Visit: Payer: Self-pay

## 2023-04-28 MED ORDER — PROCHLORPERAZINE MALEATE 5 MG PO TABS
5.0000 mg | ORAL_TABLET | Freq: Four times a day (QID) | ORAL | 0 refills | Status: DC | PRN
  Filled 2023-04-28 – 2023-04-29 (×4): qty 60, 15d supply, fill #0

## 2023-04-29 ENCOUNTER — Other Ambulatory Visit: Payer: Self-pay

## 2023-04-29 ENCOUNTER — Other Ambulatory Visit (HOSPITAL_COMMUNITY): Payer: Self-pay

## 2023-04-29 LAB — SYSTEMIC SCLEROSIS (SCLERODERMA) 12 ANTIBODIES PANEL 2
Centromere protein A Ab: <11 SI (ref <11)
Centromere protein B Ab: <11 SI (ref <11)
Fibrillarin Ab: <11 SI (ref <11)
PM-SCL-100 Ab: <11 SI (ref <11)
PM-SCL-75 Ab: <11 SI (ref <11)
RNA polymerase III RP11 Ab: <11 SI (ref <11)
RNA polymerase III RP155 Ab: <11 SI (ref <11)
SCL-70 extractable nuclear Ab: <11 SI (ref <11)
Th-To Ab: <11 SI (ref <11)
U1 small nuclear ribonucleoprotein 70kD Ab: <11 SI (ref <11)
U1 small nuclear ribonucleoprotein A Ab: <11 SI (ref <11)
U1 small nuclear ribonucleoprotein C Ab: <11 SI (ref <11)

## 2023-04-29 LAB — MYOSITIS SPECIFIC II ANTIBODIES PANEL
EJ AB: <11 SI (ref <11)
JO-1 AB: <11 SI (ref <11)
MDA-5 AB: <11 SI (ref <11)
MI-2 ALPHA AB: <11 SI (ref <11)
MI-2 BETA AB: <11 SI (ref <11)
NXP-2 AB: <11 SI (ref <11)
OJ AB: <11 SI (ref <11)
PL-12 AB: <11 SI (ref <11)
PL-7 AB: <11 SI (ref <11)
SRP-AB: <11 SI (ref <11)
TIF-1y AB: <11 SI (ref <11)

## 2023-04-29 LAB — IGG, IGA, IGM
IgG (Immunoglobin G), Serum: 1108 mg/dL (ref 600–1540)
IgM, Serum: 188 mg/dL (ref 50–300)
Immunoglobulin A: 185 mg/dL (ref 70–320)

## 2023-04-29 LAB — ANTI-SMITH ANTIBODY: ENA SM Ab Ser-aCnc: <1 AI

## 2023-04-29 LAB — PROTEIN ELECTROPHORESIS, SERUM, WITH REFLEX
Albumin ELP: 3.5 g/dL — ABNORMAL LOW (ref 3.8–4.8)
Alpha 1: 0.5 g/dL — ABNORMAL HIGH (ref 0.2–0.3)
Alpha 2: 1 g/dL — ABNORMAL HIGH (ref 0.5–0.9)
Beta 2: 0.4 g/dL (ref 0.2–0.5)
Beta Globulin: 0.4 g/dL (ref 0.4–0.6)
Gamma Globulin: 1 g/dL (ref 0.8–1.7)
Total Protein: 6.8 g/dL (ref 6.1–8.1)

## 2023-04-29 LAB — SJOGRENS SYNDROME-B EXTRACTABLE NUCLEAR ANTIBODY: SSB (La) (ENA) Antibody, IgG: <1 AI

## 2023-04-29 LAB — IFE INTERPRETATION

## 2023-04-29 LAB — URINALYSIS, ROUTINE W REFLEX MICROSCOPIC
Bilirubin Urine: NEGATIVE
Glucose, UA: NEGATIVE
Hgb urine dipstick: NEGATIVE
Leukocytes,Ua: NEGATIVE
Nitrite: NEGATIVE
Protein, ur: NEGATIVE
Specific Gravity, Urine: 1.029 (ref 1.001–1.035)
pH: <=5 (ref 5.0–8.0)

## 2023-04-29 LAB — C3 AND C4
C3 Complement: 203 mg/dL — ABNORMAL HIGH (ref 82–185)
C4 Complement: 32 mg/dL (ref 15–53)

## 2023-04-29 LAB — BETA-2 GLYCOPROTEIN ANTIBODIES
Beta-2 Glyco 1 IgA: <2 U/mL (ref <20.0)
Beta-2 Glyco 1 IgM: <2 U/mL (ref <20.0)
Beta-2 Glyco I IgG: <2 U/mL (ref <20.0)

## 2023-04-29 LAB — CK: Total CK: 55 U/L (ref 22–308)

## 2023-04-29 LAB — ANTI-DNA ANTIBODY, DOUBLE-STRANDED: ds DNA Ab: 1 [IU]/mL

## 2023-04-29 LAB — HEPATITIS B SURFACE ANTIGEN: Hepatitis B Surface Ag: NONREACTIVE

## 2023-04-29 LAB — CARDIOLIPIN ANTIBODIES, IGG, IGM, IGA
Anticardiolipin IgA: 3.8 [APL'U]/mL (ref <20.0)
Anticardiolipin IgG: <2 [GPL'U]/mL (ref <20.0)
Anticardiolipin IgM: <2 [MPL'U]/mL (ref <20.0)

## 2023-04-29 LAB — RNP ANTIBODY: Ribonucleic Protein(ENA) Antibody, IgG: <1 AI

## 2023-04-29 LAB — SEDIMENTATION RATE: Sed Rate: 46 mm/h — ABNORMAL HIGH (ref 0–20)

## 2023-04-29 LAB — TSH: TSH: 13.26 m[IU]/L — ABNORMAL HIGH (ref 0.40–4.50)

## 2023-04-29 LAB — PAN-ANCA
ANCA SCREEN: NEGATIVE
Myeloperoxidase Abs: <1 AI (ref <1.0)
Serine Protease 3: <1 AI (ref <1.0)

## 2023-04-29 LAB — HEPATITIS B CORE ANTIBODY, IGM: Hep B C IgM: NONREACTIVE

## 2023-04-29 LAB — CYCLIC CITRUL PEPTIDE ANTIBODY, IGG: Cyclic Citrullin Peptide Ab: <16 U

## 2023-04-29 LAB — CRYOGLOBULIN: Cryoglobulin, Qualitative Analysis: NOT DETECTED

## 2023-04-29 LAB — SJOGRENS SYNDROME-A EXTRACTABLE NUCLEAR ANTIBODY: SSA (Ro) (ENA) Antibody, IgG: <1 AI

## 2023-04-29 LAB — HMGCR AB (IGG): HMGCR AB (IGG): <2 CU (ref <20)

## 2023-04-29 LAB — HEPATITIS C ANTIBODY: Hepatitis C Ab: NONREACTIVE

## 2023-04-30 ENCOUNTER — Other Ambulatory Visit (HOSPITAL_COMMUNITY): Payer: Self-pay

## 2023-04-30 ENCOUNTER — Other Ambulatory Visit: Payer: Self-pay

## 2023-05-01 ENCOUNTER — Encounter: Payer: Self-pay | Admitting: Pharmacist

## 2023-05-01 ENCOUNTER — Other Ambulatory Visit (HOSPITAL_COMMUNITY): Payer: Self-pay

## 2023-05-01 ENCOUNTER — Other Ambulatory Visit: Payer: Self-pay

## 2023-05-01 MED ORDER — ONDANSETRON HCL 4 MG PO TABS
4.0000 mg | ORAL_TABLET | Freq: Three times a day (TID) | ORAL | 3 refills | Status: DC
  Filled 2023-05-01: qty 18, 21d supply, fill #0
  Filled 2023-05-02: qty 40, 14d supply, fill #0

## 2023-05-01 MED ORDER — PANTOPRAZOLE SODIUM 40 MG PO TBEC
40.0000 mg | DELAYED_RELEASE_TABLET | Freq: Every day | ORAL | 5 refills | Status: DC
  Filled 2023-05-01: qty 30, 30d supply, fill #0
  Filled 2023-05-27: qty 30, 30d supply, fill #1
  Filled 2023-06-26: qty 30, 30d supply, fill #2
  Filled 2023-07-20: qty 30, 30d supply, fill #3
  Filled 2023-08-17: qty 30, 30d supply, fill #4
  Filled 2023-09-20: qty 30, 30d supply, fill #5

## 2023-05-01 MED ORDER — AMOXICILLIN-POT CLAVULANATE 875-125 MG PO TABS
1.0000 | ORAL_TABLET | Freq: Two times a day (BID) | ORAL | 0 refills | Status: DC
  Filled 2023-05-01 – 2023-05-02 (×2): qty 28, 14d supply, fill #0

## 2023-05-02 ENCOUNTER — Ambulatory Visit (HOSPITAL_COMMUNITY)
Admission: RE | Admit: 2023-05-02 | Discharge: 2023-05-02 | Disposition: A | Discharge status: Home / Self Care | Payer: 59 | Source: Ambulatory Visit | Attending: Physician Assistant | Admitting: Physician Assistant

## 2023-05-02 ENCOUNTER — Telehealth: Payer: Self-pay | Admitting: *Deleted

## 2023-05-02 ENCOUNTER — Other Ambulatory Visit (HOSPITAL_COMMUNITY): Payer: Self-pay

## 2023-05-02 DIAGNOSIS — C8449 Peripheral T-cell lymphoma, not classified, extranodal and solid organ sites: Secondary | ICD-10-CM | POA: Diagnosis present

## 2023-05-02 LAB — GLUCOSE, CAPILLARY: Glucose-Capillary: 142 mg/dL — ABNORMAL HIGH (ref 70–99)

## 2023-05-02 MED ORDER — FLUDEOXYGLUCOSE F - 18 (FDG) INJECTION
11.6400 | Freq: Once | INTRAVENOUS | Status: AC
  Administered 2023-05-02: 11.64 via INTRAVENOUS

## 2023-05-02 NOTE — Telephone Encounter (Signed)
It would be patient's decision.

## 2023-05-02 NOTE — Telephone Encounter (Signed)
Patient's wife Elijah [name] called on behalf of the patient. She states that at his appointment Elijah [name] was advised to contact the dermatologist to schedule a skin biopsy. Elijah [name] advised since patient is going through a lot with recent cancer diagnosis, [name] does not want to have the skin biopsy unless it will change his treatment. Please advise.

## 2023-05-02 NOTE — Telephone Encounter (Signed)
Reached out to patient and advised Dr. Corliss Skains advised having the skin biopsy would be his decision. Patient expressed understanding.

## 2023-05-05 ENCOUNTER — Other Ambulatory Visit (HOSPITAL_COMMUNITY): Payer: Self-pay

## 2023-05-05 ENCOUNTER — Encounter (HOSPITAL_COMMUNITY): Payer: 59

## 2023-05-05 ENCOUNTER — Other Ambulatory Visit: Payer: Self-pay | Admitting: General Surgery

## 2023-05-05 DIAGNOSIS — R1907 Generalized intra-abdominal and pelvic swelling, mass and lump: Secondary | ICD-10-CM

## 2023-05-05 MED ORDER — ALBUTEROL SULFATE HFA 108 (90 BASE) MCG/ACT IN AERS
2.0000 | INHALATION_SPRAY | Freq: Every day | RESPIRATORY_TRACT | 0 refills | Status: DC | PRN
  Filled 2023-05-05: qty 6.7, 17d supply, fill #0
  Filled 2023-05-05: qty 6.7, 16d supply, fill #0

## 2023-05-06 ENCOUNTER — Encounter: Payer: Self-pay | Admitting: General Surgery

## 2023-05-06 ENCOUNTER — Ambulatory Visit (HOSPITAL_COMMUNITY)
Admission: RE | Admit: 2023-05-06 | Discharge: 2023-05-06 | Disposition: A | Discharge status: Home / Self Care | Payer: Medicare Other | Source: Ambulatory Visit | Attending: Cardiology | Admitting: Cardiology

## 2023-05-06 ENCOUNTER — Other Ambulatory Visit (HOSPITAL_COMMUNITY): Payer: Self-pay | Admitting: General Surgery

## 2023-05-06 ENCOUNTER — Telehealth: Payer: Self-pay | Admitting: *Deleted

## 2023-05-06 DIAGNOSIS — R1907 Generalized intra-abdominal and pelvic swelling, mass and lump: Secondary | ICD-10-CM

## 2023-05-06 DIAGNOSIS — Z08 Encounter for follow-up examination after completed treatment for malignant neoplasm: Secondary | ICD-10-CM | POA: Diagnosis present

## 2023-05-06 DIAGNOSIS — C8449 Peripheral T-cell lymphoma, not classified, extranodal and solid organ sites: Secondary | ICD-10-CM

## 2023-05-06 DIAGNOSIS — Z0189 Encounter for other specified special examinations: Secondary | ICD-10-CM | POA: Diagnosis not present

## 2023-05-06 LAB — ECHOCARDIOGRAM COMPLETE
AR max vel: 2.11 cm2
AV Area VTI: 2.52 cm2
AV Area mean vel: 2.17 cm2
AV Mean grad: 3.5 mm[Hg]
AV Peak grad: 6.1 mm[Hg]
Ao pk vel: 1.24 m/s
Area-P 1/2: 3.46 cm2
S' Lateral: 2.27 cm

## 2023-05-06 NOTE — Progress Notes (Unsigned)
 Mugweru, Thom, MD  Baldwin Channing CROME Cc: Polly Cordella LABOR, MD PROCEDURE / BIOPSY REVIEW Date: 05/06/23  Requested Biopsy site: Retroperitoneum. Reason for request: Mass Imaging review: Best seen on PET CT, 05/02/23  Decision: Defer Path proven T cell lymphoma on surgical resection (04/10/23), with likely centrally necrotic mass at resection site on non-contrast PET CT (05/02/23)  Additional comments: Recommend repeat contrasted CT AP w IV contrast, to re-evaluate prior to Bx vs other VIR intervention.  Spoke w Dr Polly Re recommendations (05/05/23)  Please contact me with questions, concerns, or if issue pertaining to this request arise.  Thom Hall, MD [facility]       Previous Messages    ----- Message ----- From: Baldwin Channing CROME Sent: 05/06/2023   2:23 PM EST To: Channing CROME Baldwin; Eleanor Mighty; * Subject: US  BIOPSY (ABDOMINAL RETROPERTIONEAL MASS)    Procedure : US  BIOPSY (ABDOMINAL RETROPERTIONEAL MASS)  Reason : please Eval for drain versus biopsy of intra-abdominal collection or mass Dx: Generalized intra-abdominal and pelvic swelling, mass and lump [R19.07 (ICD-10-CM)]  History :  NM PET Image Initial (PI) Skull Base To Thigh ,CT ABDOMEN PELVIS W CONTRAST ,DG Abd Portable 1 View,CT ABDOMEN PELVIS W CONTRAST  Provider: Polly Cordella LABOR, MD   Provider contact : [phone]

## 2023-05-06 NOTE — Telephone Encounter (Signed)
 TCT Elijah [name] to advise that confirmed with IR they have order for patient's biopsy ordered by Dr. Polly. Advised her they will call to schedule. Elijah [name] said he is willing to go anytime. She states that they want to get his treatment started soon.

## 2023-05-06 NOTE — Telephone Encounter (Signed)
 Ms. Moronta called office. Said Dr. Polly, patient's surgeon, told them yesterday he was placing an expedited order with [facility] for a biopsy and port placement. She was concerned because no one had called to schedule.   Contacted Interventional Radiology at [facility] to follow up - they advised contacting [facility]iology Scheduling as IR had not received an order for this.  Per Eleanor MANIFOLD with Centralized Scheduling, a referral placed in EPIC for IR by a provider outside [facility] is not seen by scheduling.  She advised that surgeon send a signed order to [employer] # [phone].  [facility] and gave this information to staff with fax number.   Also faxed the Urgent IR referral in EPIC from Dr. Polly to [facility] Scheduling as added support. Fax confirmation received.

## 2023-05-08 ENCOUNTER — Ambulatory Visit (HOSPITAL_COMMUNITY)
Admission: RE | Admit: 2023-05-08 | Discharge: 2023-05-08 | Disposition: A | Discharge status: Home / Self Care | Payer: Medicare Other | Source: Ambulatory Visit | Attending: General Surgery | Admitting: General Surgery

## 2023-05-08 DIAGNOSIS — C8449 Peripheral T-cell lymphoma, not classified, extranodal and solid organ sites: Secondary | ICD-10-CM | POA: Insufficient documentation

## 2023-05-08 MED ORDER — IOHEXOL 300 MG/ML  SOLN
100.0000 mL | Freq: Once | INTRAMUSCULAR | Status: AC | PRN
Start: 2023-05-08 — End: 2023-05-08
  Administered 2023-05-08: 100 mL via INTRAVENOUS

## 2023-05-08 MED ORDER — IOHEXOL 300 MG/ML  SOLN
30.0000 mL | Freq: Once | INTRAMUSCULAR | Status: AC | PRN
  Administered 2023-05-08: 30 mL via ORAL

## 2023-05-09 ENCOUNTER — Other Ambulatory Visit: Payer: Self-pay

## 2023-05-09 ENCOUNTER — Ambulatory Visit (HOSPITAL_COMMUNITY): Payer: 59

## 2023-05-09 ENCOUNTER — Other Ambulatory Visit (HOSPITAL_COMMUNITY): Payer: Medicare Other

## 2023-05-09 ENCOUNTER — Telehealth: Payer: Self-pay | Admitting: *Deleted

## 2023-05-09 ENCOUNTER — Other Ambulatory Visit (HOSPITAL_COMMUNITY): Payer: Self-pay

## 2023-05-09 MED ORDER — AMOXICILLIN-POT CLAVULANATE 875-125 MG PO TABS
1.0000 | ORAL_TABLET | Freq: Two times a day (BID) | ORAL | 0 refills | Status: DC
  Filled 2023-05-09 – 2023-05-13 (×3): qty 28, 14d supply, fill #0

## 2023-05-09 NOTE — Telephone Encounter (Signed)
 Received call from pt's wife, Elijah [name].  Sge states that her husband had a CT scan done yesterday, and that Dr. Polly has reviewed the results. He told the patient and wife that there was not a need for additional biopsies, that the mass remains consistent with known lymphoma. Elijah [name] wanted to make sure Dr. Federico and Dr. Polly have been communicating and that they are in agreement about no need for biopsy. Discussed this with Dr. Federico. He states that Dr. Polly and he have been in communication with each other and are in agreement with no need for biopsy. The plan is to continue current wound care with wound vac and JP drain. Dr. Polly to advise when wound has healed enough to begin chemotherapy. OK for pt to have port placed on 05/13/23. Pt and wife aware of next appts with both physicians. No further questions or concerns. Advised to call if needed.

## 2023-05-10 ENCOUNTER — Encounter (HOSPITAL_COMMUNITY): Payer: Self-pay

## 2023-05-11 ENCOUNTER — Other Ambulatory Visit: Payer: Self-pay | Admitting: Radiology

## 2023-05-12 ENCOUNTER — Other Ambulatory Visit (HOSPITAL_COMMUNITY): Payer: Self-pay

## 2023-05-12 NOTE — H&P (Signed)
 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 He is s/p exploratory laparotomy and small bowel resection 04/10/23. Ascites was negative for malignant cells but culture grew rare klebsiella, few lactobacillus. Pt currently on Augmentin. No reported fevers/chills. He presents today for port a cath placement to assist with treatment.     Patient is Full Code  Past Medical History:  Diagnosis Date   Acid reflux    Allergic rhinitis    Asthma    Bloating    BPH (benign prostatic hyperplasia)    Depression    ED (erectile dysfunction)    Epigastric pain    Exercise-induced asthma    GERD (gastroesophageal reflux disease)    History of kidney stones    Hypercholesteremia    Migraine 2009   Polymyalgia rheumatica (HCC)    Prediabetes    started on metformin during january hospitalization   Prostate disease    Sleep apnea     Past Surgical History:  Procedure Laterality Date   ANKLE SURGERY Right    BOWEL RESECTION N/A 04/10/2023   Procedure: SMALL BOWEL RESECTION WITH MASS;  Surgeon: Moise Boring, MD;  Location: [facility] OR;  Service: General;  Laterality: N/A;   CHEST TUBE INSERTION Left    COLONOSCOPY     CYSTOSCOPY WITH RETROGRADE PYELOGRAM, URETEROSCOPY AND STENT PLACEMENT Left 06/29/2020   Procedure: CYSTOSCOPY WITH RETROGRADE PYELOGRAM, URETEROSCOPY , BASKET EXTRACTION, AND STENT PLACEMENT;  Surgeon: Sebastian Ache, MD;  Location: [facility] ORS;  Service: Urology;  Laterality: Left;  1 HR   KNEE SURGERY Right    LAPAROTOMY N/A  04/10/2023   Procedure: EXPLORATORY LAPAROTOMY;  Surgeon: Moise Boring, MD;  Location: [facility] OR;  Service: General;  Laterality: N/A;   SKIN CANCER EXCISION     unsure   TONSILLECTOMY     VASECTOMY     WISDOM TOOTH EXTRACTION      Allergies: Singulair [montelukast], Amiodarone, and Crestor [rosuvastatin]  Medications: Prior to Admission medications   Medication Sig Start Date End Date Taking? Authorizing Provider  Accu-Chek Softclix Lancets lancets Use daily before breakfast. 04/16/23   Narda Bonds, MD  acetaminophen (TYLENOL) 325 MG tablet Take 2 tablets (650 mg total) by mouth every 6 (six) hours as needed. 04/16/23   Barnetta Chapel, PA-C  albuterol (VENTOLIN HFA) 108 (90 Base) MCG/ACT inhaler Inhale 2 puffs into the lungs as needed (max of 12 puffs per day) 05/04/23     alfuzosin (UROXATRAL) 10 MG 24 hr tablet Take 1 tablet (10 mg total) by mouth daily. 01/03/23     amoxicillin-clavulanate (AUGMENTIN) 875-125 MG tablet Take 1 tablet by mouth every 12 (twelve) hours for 14 days. 05/09/23     Blood Glucose Monitoring Suppl (ACCU-CHEK GUIDE) w/Device KIT Use daily before breakfast. 04/16/23   Narda Bonds, MD  CINNAMON PO Take 1 capsule by mouth daily.    [provider]  famotidine (PEPCID) 20 MG tablet Take 20 mg by mouth 2 (two) times daily as needed for heartburn or indigestion. Patient only take 1 tablet in the am.    [provider]  finasteride (PROSCAR)  5 MG tablet Take 1 tablet (5 mg total) by mouth daily. 02/11/23     glucose blood (ACCU-CHEK GUIDE TEST) test strip Use daily before breakfast. 04/16/23   Narda Bonds, MD  Glucose Blood (BLOOD GLUCOSE TEST STRIPS) STRP Use to check blood sugar before breakfast as directed 04/16/23   Narda Bonds, MD  metFORMIN (GLUCOPHAGE) 850 MG tablet Take 1 tablet (850 mg total) by mouth daily with breakfast for 7 days, THEN 1 tablet (850 mg total) 2 (two) times daily with a meal. Increase from daily to twice daily if  no/minimal GI symptoms). Patient not taking: Reported on 04/23/2023 04/16/23 05/24/23  Narda Bonds, MD  methocarbamol (ROBAXIN) 500 MG tablet Take 2 tablets (1,000 mg total) by mouth every 8 (eight) hours as needed for muscle spasms. 04/16/23   Barnetta Chapel, PA-C  minoxidil (LONITEN) 2.5 MG tablet Take 1 tablet (2.5 mg total) by mouth daily. 12/03/22     ondansetron (ZOFRAN) 4 MG tablet Take 1 tablet (4 mg total) by mouth every 8 (eight) hours as needed for nausea. 05/01/23     ondansetron (ZOFRAN-ODT) 4 MG disintegrating tablet Take 4 mg by mouth as needed for nausea or vomiting.    [provider]  oxyCODONE (OXY IR/ROXICODONE) 5 MG immediate release tablet Take 1-2 tablets (5-10 mg total) by mouth every 6 (six) hours as needed for severe pain (pain score 7-10). 04/23/23   Jaci Standard, MD  pantoprazole (PROTONIX) 40 MG tablet Take 1 tablet (40 mg total) by mouth daily 30 minutes to 1 hour before morning meal. 05/01/23     predniSONE (DELTASONE) 5 MG tablet Take 4.5 tablets (22.5 mg total) by mouth daily. 04/16/23   Narda Bonds, MD  predniSONE (DELTASONE) 5 MG tablet Take 4 tablets (20 mg total) by mouth daily for 14 days, THEN 3 tablets (15 mg total) daily for 14 days, THEN 2 tablets (10 mg total) daily for 1 month. 04/23/23 07/05/23  Pollyann Savoy, MD  Probiotic TBEC Take 1 capsule by mouth daily.    [provider]  prochlorperazine (COMPAZINE) 5 MG tablet Take 1 tablet (5 mg total) by mouth every 6 (six) hours as needed for nausea or vomiting. 04/28/23   Moise Boring, MD  sildenafil (REVATIO) 20 MG tablet Take 2-5 tablets (40-100 mg total) by mouth as directed as needed for sexual funtion. Patient not taking: Reported on 04/23/2023 06/24/22     tadalafil, PAH, (ADCIRCA) 20 MG tablet Take 1 tablet (20 mg total) by mouth every 3 (three) days as needed for sexual activity Patient not taking: Reported on 04/23/2023 08/13/22        Family History  Problem Relation Age of  Onset   Healthy Son    Healthy Daughter     Social History   Socioeconomic History   Marital status: Married    Spouse name: Not on file   Number of children: Not on file   Years of education: Not on file   Highest education level: Not on file  Occupational History   Not on file  Tobacco Use   Smoking status: Never    Passive exposure: Never   Smokeless tobacco: Never  Vaping Use   Vaping status: Never Used  Substance and Sexual Activity   Alcohol use: Yes    Comment: social   Drug use: Never   Sexual activity: Not on file  Other Topics Concern   Not on file  Social History Narrative  Not on file   Social Drivers of Health   Financial Resource Strain: Not on file  Food Insecurity: No Food Insecurity (04/10/2023)   Hunger Vital Sign    Worried About Running Out of Food in the Last Year: Never true    Ran Out of Food in the Last Year: Never true  Transportation Needs: No Transportation Needs (04/10/2023)   PRAPARE - Administrator, Civil Service (Medical): No    Lack of Transportation (Non-Medical): No  Physical Activity: Not on file  Stress: Not on file  Social Connections: Not on file       Review of Systems: denies fever,HA,CP,worsening dyspnea, cough, N/V or bleeding; he does have some abd discomfort, fatigue  Vital Signs:pending     Advance Care Plan: The advanced care place/surrogate decision maker was discussed at the time of visit and the patient did not wish to discuss or was not able to name a surrogate decision maker or provide an advance care plan.  Physical Exam: awake/alert; chest- distant but clear BS bilat; heart- RRR; abd-soft, mild dist, +BS, midline wound vac, left lower JP drain in place with small amount tan fluid in bulb, some mild gen tenderness to palpation; no LE edema  Imaging: CT ABDOMEN PELVIS W CONTRAST Result Date: 05/08/2023 CLINICAL DATA:  Follow-up peripheral T-cell lymphoma of the intestine. * Tracking Code: BO *  EXAM: CT ABDOMEN AND PELVIS WITH CONTRAST TECHNIQUE: Multidetector CT imaging of the abdomen and pelvis was performed using the standard protocol following bolus administration of intravenous contrast. RADIATION DOSE REDUCTION: This exam was performed according to the departmental dose-optimization program which includes automated exposure control, adjustment of the mA and/or kV according to patient size and/or use of iterative reconstruction technique. CONTRAST:  OMNIPAQUE IOHEXOL 300 MG/ML  SOLN COMPARISON:  PET-CT on 05/02/2023 FINDINGS: Lower Chest: No acute findings. Hepatobiliary:  No suspicious hepatic masses identified. Pancreas:  No mass or inflammatory changes. Spleen: Within normal limits in size and appearance. Adrenals/Urinary Tract: No suspicious masses identified. No evidence of ureteral calculi or hydronephrosis. Stomach/Bowel: Large poorly defined mass with central necrosis is again seen in the left lower quadrant mesentery which shows involvement of small bowel loops. This measures 11.8 x 7.6 cm on image 73/2, compared to 10.7 x 7.4 cm at same level on previous study. Small adjacent mesenteric soft tissue nodules or lymph nodes are again seen, some showing mild increase in size since previous study. Index lesion measures 2.0 x 1.7 cm on image 71/2 compared to 1.7 x 1.2 cm on prior exam. Surgical drain remains in place. No No evidence of bowel obstruction. Vascular/Lymphatic: Mild increase in small mesenteric soft tissue nodules or lymph nodes, as described above. No acute vascular findings. Reproductive:  Stable mildly enlarged prostate. Other:  None. Musculoskeletal:  No suspicious bone lesions identified. IMPRESSION: Mild increase in size of large necrotic mass in left lower quadrant mesentery, which involves small bowel. Mild increase in size of adjacent small mesenteric soft tissue nodules or lymph nodes. Electronically Signed   By: Danae Orleans M.D.   On: 05/08/2023 14:39    ECHOCARDIOGRAM COMPLETE Result Date: 05/06/2023    ECHOCARDIOGRAM REPORT   Patient Name:   Elijah [name] Date of Exam: 05/06/2023 Medical Rec #:  [id number]        Height:       73.0 in Accession #:    [id number]       Weight:       236.5  lb Date of Birth:  10-Jan-1956         BSA:          2.311 m Patient Age:    67 years         BP:           155/81 mmHg Patient Gender: M                HR:           84 bpm. Exam Location:  Outpatient Procedure: 2D Echo, Pediatric Echo and Cardiac Doppler Indications:    Z51.11 Encounter for antineoplastic chemotheraphy  History:        Patient has no prior history of Echocardiogram examinations.                 Risk Factors:Sleep Apnea and Non-Smoker. Patient denies chest                 pain, SOB and leg edema. He currently has lymphoma and                 recovering from small bowel surgery 04/10/2023. Pre-chemotherapy                 schocardiogram needed.  Sonographer:    Carlos American RVT, RDCS (AE), RDMS Referring Phys: [id number] Briant Cedar  Sonographer Comments: Suboptimal apical window. Image acquisition challenging due to patient body habitus. Global longitudinal strain was attempted. IMPRESSIONS  1. Left ventricular ejection fraction, by estimation, is 65 to 70%. The left ventricle has normal function. The left ventricle has no regional wall motion abnormalities. There is mild left ventricular hypertrophy. Left ventricular diastolic parameters are consistent with Grade I diastolic dysfunction (impaired relaxation).  2. Right ventricular systolic function is normal. The right ventricular size is normal. Tricuspid regurgitation signal is inadequate for assessing PA pressure.  3. The mitral valve is degenerative. Trivial mitral valve regurgitation. No evidence of mitral stenosis.  4. The aortic valve is grossly normal. There is mild calcification of the aortic valve. Aortic valve regurgitation is not visualized. No aortic stenosis is present.  5. The inferior vena cava  is normal in size with greater than 50% respiratory variability, suggesting right atrial pressure of 3 mmHg. FINDINGS  Left Ventricle: Left ventricular ejection fraction, by estimation, is 65 to 70%. The left ventricle has normal function. The left ventricle has no regional wall motion abnormalities. Global longitudinal strain performed but not reported based on interpreter judgement due to suboptimal tracking. The left ventricular internal cavity size was normal in size. There is mild left ventricular hypertrophy. Left ventricular diastolic parameters are consistent with Grade I diastolic dysfunction (impaired relaxation). Right Ventricle: The right ventricular size is normal. No increase in right ventricular wall thickness. Right ventricular systolic function is normal. Tricuspid regurgitation signal is inadequate for assessing PA pressure. Left Atrium: Left atrial size was normal in size. Right Atrium: Right atrial size was normal in size. Pericardium: There is no evidence of pericardial effusion. Mitral Valve: The mitral valve is degenerative in appearance. There is mild thickening of the mitral valve leaflet(s). Trivial mitral valve regurgitation. No evidence of mitral valve stenosis. Tricuspid Valve: The tricuspid valve is normal in structure. Tricuspid valve regurgitation is trivial. No evidence of tricuspid stenosis. Aortic Valve: The aortic valve is grossly normal. There is mild calcification of the aortic valve. Aortic valve regurgitation is not visualized. No aortic stenosis is present. Aortic valve mean gradient measures 3.5 mmHg. Aortic valve peak  gradient measures 6.1 mmHg. Aortic valve area, by VTI measures 2.52 cm. Pulmonic Valve: The pulmonic valve was normal in structure. Pulmonic valve regurgitation is trivial. No evidence of pulmonic stenosis. Aorta: The aortic root is normal in size and structure. Ascending aorta measurements are within normal limits for age when indexed to body surface area.  Venous: The inferior vena cava is normal in size with greater than 50% respiratory variability, suggesting right atrial pressure of 3 mmHg. IAS/Shunts: No atrial level shunt detected by color flow Doppler.  LEFT VENTRICLE PLAX 2D LVIDd:         3.66 cm   Diastology LVIDs:         2.27 cm   LV e' medial:    7.14 cm/s LV PW:         1.13 cm   LV E/e' medial:  6.1 LV IVS:        1.16 cm   LV e' lateral:   10.10 cm/s LVOT diam:     1.87 cm   LV E/e' lateral: 4.3 LV SV:         44 LV SV Index:   19 LVOT Area:     2.75 cm  RIGHT VENTRICLE RV S prime:     16.10 cm/s TAPSE (M-mode): 3.1 cm LEFT ATRIUM             Index        RIGHT ATRIUM           Index LA diam:        3.69 cm 1.60 cm/m   RA Area:     13.30 cm LA Vol (A2C):   68.8 ml 29.78 ml/m  RA Volume:   25.70 ml  11.12 ml/m LA Vol (A4C):   49.1 ml 21.25 ml/m LA Biplane Vol: 57.5 ml 24.89 ml/m  AORTIC VALVE                    PULMONIC VALVE AV Area (Vmax):    2.11 cm     PV Vmax:       1.01 m/s AV Area (Vmean):   2.17 cm     PV Peak grad:  4.1 mmHg AV Area (VTI):     2.52 cm AV Vmax:           123.50 cm/s AV Vmean:          82.100 cm/s AV VTI:            0.174 m AV Peak Grad:      6.1 mmHg AV Mean Grad:      3.5 mmHg LVOT Vmax:         95.00 cm/s LVOT Vmean:        64.900 cm/s LVOT VTI:          0.160 m LVOT/AV VTI ratio: 0.92  AORTA Ao Root diam: 3.56 cm Ao Asc diam:  3.68 cm Ao Arch diam: 3.2 cm MITRAL VALVE MV Area (PHT): 3.46 cm    SHUNTS MV Decel Time: 219 msec    Systemic VTI:  0.16 m MV E velocity: 43.50 cm/s  Systemic Diam: 1.87 cm MV A velocity: 57.60 cm/s MV E/A ratio:  0.76 Weston Brass MD Electronically signed by Weston Brass MD Signature Date/Time: 05/06/2023/9:52:34 PM    Final    NM PET Image Initial (PI) Skull Base To Thigh Result Date: 05/02/2023 CLINICAL DATA:  Initial treatment strategy for new diagnosis monomorphic T-cell lymphoma of the intestine. Post  partial resection. EXAM: NUCLEAR MEDICINE PET SKULL BASE TO THIGH TECHNIQUE:  11.6 mCi F-18 FDG was injected intravenously. Full-ring PET imaging was performed from the skull base to thigh after the radiotracer. CT data was obtained and used for attenuation correction and anatomic localization. Fasting blood glucose: 146 mg/dl COMPARISON:  CT 82/95/6213, 04/10/2023 FINDINGS: Mediastinal blood pool activity: SUV max 2.3 Liver activity: SUV max NA NECK: No hypermetabolic lymph nodes in the neck. Incidental CT findings: None. CHEST: No hypermetabolic mediastinal or hilar nodes. No suspicious pulmonary nodules on the CT scan. Incidental CT findings: None. ABDOMEN/PELVIS: Within the LEFT central abdomen there is a thick-walled intensely hypermetabolic cavitary process at site of previously described cavitary mass. Cavitary mass has an intensely hypermetabolic thickened rim measuring 2 cm in thickness with SUV max equal 17 (image 67). The mass has central photopenia consistent with necrosis. Mass measures approximately 10.2 by 8. 2 cm in axial dimension (169/series 3). The mass is elongated in coronal projection measuring 18 cm. The findings are not dissimilar from the pre surgical CT. There is a percutaneous drainage catheter is extends through the central portion of the mass. The central portion of mass is photopenic consistent with necrosis. There is intense metabolic activity associated with the ascending, transverse and descending colon. No clear hypermetabolic adenopathy in the abdomen pelvis. Adenopathy in the pelvis. Incidental CT findings: None. SKELETON: No focal hypermetabolic activity to suggest skeletal metastasis. Incidental CT findings: None. IMPRESSION: 1. Intensely hypermetabolic thick-walled cavitary mass in the LEFT central abdomen following small-bowel resection for intestinal lymphoma. Residual thick-walled hypermetabolic mass either represents residual lymphoma or postoperative infection. Favor lymphoma. 2. Central photopenia consistent with necrosis. 3. Percutaneous drainage  catheter extends through the central portion of the mass. 4. No evidence of hypermetabolic adenopathy in the abdomen pelvis. 5. No evidence of hypermetabolic metastatic disease in the chest or pelvis. 6. Intense metabolic activity associated with the ascending, transverse and descending colon is favored physiologic. Electronically Signed   By: Genevive Bi M.D.   On: 05/02/2023 16:16   CT ABDOMEN PELVIS W CONTRAST Result Date: 04/16/2023 CLINICAL DATA:  Postop abdominal pain, newly diagnosed lymphoma EXAM: CT ABDOMEN AND PELVIS WITH CONTRAST TECHNIQUE: Multidetector CT imaging of the abdomen and pelvis was performed using the standard protocol following bolus administration of intravenous contrast. RADIATION DOSE REDUCTION: This exam was performed according to the departmental dose-optimization program which includes automated exposure control, adjustment of the mA and/or kV according to patient size and/or use of iterative reconstruction technique. CONTRAST:  75mL OMNIPAQUE IOHEXOL 350 MG/ML SOLN COMPARISON:  04/10/2023 FINDINGS: Lower chest: Mild bibasilar atelectasis. Hepatobiliary: Liver is within normal limits. Gallbladder is unremarkable. No intrahepatic or extrahepatic ductal dilatation. Pancreas: Within normal limits Spleen: Within normal limits Adrenals/Urinary Tract: Adrenal glands are within normal limits. Bilateral simple renal cysts, measuring up to 3.6 cm in the right lower kidney (series 3/image 49), benign (Bosniak I). 3 mm nonobstructing left lower pole renal calculus (series 3/image 41). No hydronephrosis. Bladder is mildly thick-walled although underdistended. Stomach/Bowel: Stomach is within normal limits. No evidence of bowel obstruction. Suspected subtotal small bowel/mesenteric mass resection, with residual abnormal soft tissue in the left mid abdomen (series 3/images 53, 59, and 69). Central necrosis with mesenteric fluid/gas (series 3/image 60). Adjacent left mid abdominal drain  (series 3/image 83) with terminates in the right lower abdomen (series 3/image 68). Suspected appendix is normal (series 3/image 84). No colonic wall thickening or inflammatory changes. Vascular/Lymphatic: No evidence of abdominal aortic aneurysm. Atherosclerotic calcifications  of the abdominal aorta and branch vessels, although vessels remain patent. No suspicious abdominopelvic lymphadenopathy. Reproductive: Prostatomegaly, suggesting BPH. Other: Additional small mesenteric fluid collection beneath the anterior abdominal wall measuring approximately 2.7 x 5.4 cm (series 3/image 55), postsurgical. No free air. Musculoskeletal: Postsurgical changes along the midline anterior abdominal wall. Degenerative changes of the visualized thoracolumbar spine. IMPRESSION: Suspected subtotal small bowel/mesenteric mass resection, with residual abnormal soft tissue in the left mid abdomen, as above. This likely corresponds to the patient's known T-cell lymphoma. Associated central necrosis with mesenteric fluid/gas. Adjacent left mid abdominal drain with terminates in the right lower abdomen. Additional small mesenteric fluid collection beneath the anterior abdominal wall, postsurgical. Additional ancillary findings as above. Electronically Signed   By: Charline Bills M.D.   On: 04/16/2023 20:48    Labs:  CBC: Recent Labs    04/13/23 0547 04/14/23 0455 04/15/23 0603 04/23/23 1543  WBC 13.5* 12.8* 13.4* 18.3*  HGB 10.8* 10.3* 10.6* 12.3*  HCT 34.3* 32.6* 32.7* 38.4*  PLT 239 255 279 599*    COAGS: No results for input(s): "INR", "APTT" in the last 8760 hours.  BMP: Recent Labs    04/13/23 0547 04/14/23 0455 04/15/23 0603 04/17/23 0548 04/23/23 1543  NA 146* 142 141  --  137  K 3.7 3.2* 3.7  --  4.2  CL 110 103 106  --  99  CO2 30 24 27   --  30  GLUCOSE 143* 151* 172*  --  203*  BUN 21 18 13   --  23  CALCIUM 8.9 8.5* 8.7*  --  9.8  CREATININE 1.50* 1.43* 1.22 1.27* 1.27*  GFRNONAA 51* 54*  >60 >60 >60    LIVER FUNCTION TESTS: Recent Labs    04/10/23 0629 04/23/23 0932 04/23/23 1543  BILITOT 0.4  --  0.3  AST 18  --  16  ALT 23  --  27  ALKPHOS 61  --  74  PROT 6.9 6.8 7.3  ALBUMIN 4.0  --  3.8    TUMOR MARKERS: No results for input(s): "AFPTM", "CEA", "CA199", "CHROMGRNA" in the last 8760 hours.  Assessment and Plan: 68 y.o. 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 He is s/p exploratory laparotomy and small bowel resection 04/10/23. Ascites was negative for malignant cells but culture grew rare klebsiella, few lactobacillus. Pt currently on Augmentin. Temp today 99.1. He presents today for port a cath placement to assist with treatment. Risks and benefits of image guided port-a-catheter placement was discussed with the patient/spouse  including, but not limited to bleeding, infection, pneumothorax, or fibrin sheath development and need for additional procedures.  All of the patient's questions were answered, patient is agreeable to proceed. Consent signed and in chart.    Thank you for allowing our service to participate in Elijah [name] 's care.  Electronically Signed: D. Jeananne Rama, PA-C   05/12/2023, 4:20 PM      I spent a total of  15 minutes    in face to face in clinical consultation, greater than 50% of which was counseling/coordinating care for port a cath placement

## 2023-05-13 ENCOUNTER — Ambulatory Visit (HOSPITAL_COMMUNITY)
Admission: RE | Admit: 2023-05-13 | Discharge: 2023-05-13 | Disposition: A | Discharge status: Home / Self Care | Payer: Medicare Other | Source: Ambulatory Visit | Attending: Physician Assistant | Admitting: Physician Assistant

## 2023-05-13 ENCOUNTER — Encounter (HOSPITAL_COMMUNITY): Payer: Self-pay

## 2023-05-13 ENCOUNTER — Other Ambulatory Visit (HOSPITAL_COMMUNITY): Payer: Self-pay

## 2023-05-13 ENCOUNTER — Other Ambulatory Visit: Payer: Self-pay

## 2023-05-13 ENCOUNTER — Ambulatory Visit (HOSPITAL_COMMUNITY)
Admission: RE | Admit: 2023-05-13 | Discharge: 2023-05-13 | Disposition: A | Discharge status: Home / Self Care | Payer: Medicare Other | Source: Ambulatory Visit | Attending: Physician Assistant

## 2023-05-13 DIAGNOSIS — G4730 Sleep apnea, unspecified: Secondary | ICD-10-CM | POA: Insufficient documentation

## 2023-05-13 DIAGNOSIS — N400 Enlarged prostate without lower urinary tract symptoms: Secondary | ICD-10-CM | POA: Insufficient documentation

## 2023-05-13 DIAGNOSIS — M353 Polymyalgia rheumatica: Secondary | ICD-10-CM | POA: Insufficient documentation

## 2023-05-13 DIAGNOSIS — Z7984 Long term (current) use of oral hypoglycemic drugs: Secondary | ICD-10-CM | POA: Insufficient documentation

## 2023-05-13 DIAGNOSIS — F32A Depression, unspecified: Secondary | ICD-10-CM | POA: Insufficient documentation

## 2023-05-13 DIAGNOSIS — R7303 Prediabetes: Secondary | ICD-10-CM | POA: Insufficient documentation

## 2023-05-13 DIAGNOSIS — C8449 Peripheral T-cell lymphoma, not classified, extranodal and solid organ sites: Secondary | ICD-10-CM | POA: Insufficient documentation

## 2023-05-13 DIAGNOSIS — J45909 Unspecified asthma, uncomplicated: Secondary | ICD-10-CM | POA: Insufficient documentation

## 2023-05-13 DIAGNOSIS — E785 Hyperlipidemia, unspecified: Secondary | ICD-10-CM | POA: Diagnosis not present

## 2023-05-13 DIAGNOSIS — K219 Gastro-esophageal reflux disease without esophagitis: Secondary | ICD-10-CM | POA: Diagnosis not present

## 2023-05-13 DIAGNOSIS — Z792 Long term (current) use of antibiotics: Secondary | ICD-10-CM | POA: Insufficient documentation

## 2023-05-13 HISTORY — PX: IR IMAGING GUIDED PORT INSERTION: IMG5740

## 2023-05-13 MED ORDER — HEPARIN SOD (PORK) LOCK FLUSH 100 UNIT/ML IV SOLN
INTRAVENOUS | Status: AC
  Filled 2023-05-13: qty 5

## 2023-05-13 MED ORDER — LIDOCAINE-EPINEPHRINE 1 %-1:100000 IJ SOLN
20.0000 mL | Freq: Once | INTRAMUSCULAR | Status: AC
  Administered 2023-05-13: 20 mL via INTRADERMAL

## 2023-05-13 MED ORDER — CEFAZOLIN SODIUM-DEXTROSE 2-4 GM/100ML-% IV SOLN
2.0000 g | Freq: Once | INTRAVENOUS | Status: AC
  Administered 2023-05-13: 2 g via INTRAVENOUS

## 2023-05-13 MED ORDER — CEFAZOLIN SODIUM-DEXTROSE 2-4 GM/100ML-% IV SOLN
INTRAVENOUS | Status: AC
  Filled 2023-05-13: qty 100

## 2023-05-13 MED ORDER — FENTANYL CITRATE (PF) 100 MCG/2ML IJ SOLN
INTRAMUSCULAR | Status: AC
  Filled 2023-05-13: qty 2

## 2023-05-13 MED ORDER — DIPHENHYDRAMINE HCL 50 MG/ML IJ SOLN
INTRAMUSCULAR | Status: AC
  Filled 2023-05-13: qty 1

## 2023-05-13 MED ORDER — SODIUM CHLORIDE 0.9 % IV SOLN: INTRAVENOUS | Status: DC

## 2023-05-13 MED ORDER — MIDAZOLAM HCL 2 MG/2ML IJ SOLN
INTRAMUSCULAR | Status: AC
  Filled 2023-05-13: qty 2

## 2023-05-13 MED ORDER — FENTANYL CITRATE (PF) 100 MCG/2ML IJ SOLN
INTRAMUSCULAR | Status: AC | PRN
  Administered 2023-05-13 (×2): 50 ug via INTRAVENOUS

## 2023-05-13 MED ORDER — MIDAZOLAM HCL 2 MG/2ML IJ SOLN
INTRAMUSCULAR | Status: AC | PRN
  Administered 2023-05-13: 1 mg via INTRAVENOUS
  Administered 2023-05-13: .5 mg via INTRAVENOUS
  Administered 2023-05-13 (×2): 1 mg via INTRAVENOUS

## 2023-05-13 MED ORDER — LIDOCAINE-EPINEPHRINE 1 %-1:100000 IJ SOLN
INTRAMUSCULAR | Status: AC
  Filled 2023-05-13: qty 1

## 2023-05-13 MED ORDER — DIPHENHYDRAMINE HCL 50 MG/ML IJ SOLN
INTRAMUSCULAR | Status: AC | PRN
  Administered 2023-05-13: 50 mg via INTRAVENOUS

## 2023-05-13 MED ORDER — HEPARIN SOD (PORK) LOCK FLUSH 100 UNIT/ML IV SOLN
500.0000 [IU] | Freq: Once | INTRAVENOUS | Status: AC
  Administered 2023-05-13: 500 [IU] via INTRAVENOUS

## 2023-05-13 NOTE — Discharge Instructions (Signed)
your port that you cannot control. Summary Take care of the port as told by your health care provider. Keep the manufacturer's information card with you at all times. Change your dressing as told by your health care provider. Contact a health care provider if you have a fever or chills or if you have redness, swelling, or pain around your port insertion site. Keep all follow-up visits as told by your health care provider. This information is not intended to replace advice given to you  by your health care provider. Make sure you discuss any questions you have with your health care provider. Document Revised: 10/14/2017 Document Reviewed: 10/14/2017 Elsevier Patient Education  2021 [employer]   Moderate Conscious Sedation, Adult, Care After This sheet gives you information about how to care for yourself after your procedure. Your health care provider may also give you more specific instructions. If you have problems or questions, contact your health care provider. What can I expect after the procedure? After the procedure, it is common to have: Sleepiness for several hours. Impaired judgment for several hours. Difficulty with balance. Vomiting if you eat too soon. Follow these instructions at home: For the time period you were told by your health care provider: Rest. Do not participate in activities where you could fall or become injured. Do not drive or use machinery. Do not drink alcohol. Do not take sleeping pills or medicines that cause drowsiness. Do not make important decisions or sign legal documents. Do not take care of children on your own.        Eating and drinking Follow the diet recommended by your health care provider. Drink enough fluid to keep your urine pale yellow. If you vomit: Drink water, juice, or soup when you can drink without vomiting. Make sure you have little or no nausea before eating solid foods.    General instructions Take over-the-counter and prescription medicines only as told by your health care provider. Have a responsible adult stay with you for the time you are told. It is important to have someone help care for you until you are awake and alert. Do not smoke. Keep all follow-up visits as told by your health care provider. This is important. Contact a health care provider if: You are still sleepy or having trouble with balance after 24 hours. You feel light-headed. You keep feeling nauseous or you keep vomiting. You  develop a rash. You have a fever. You have redness or swelling around the IV site. Get help right away if: You have trouble breathing. You have new-onset confusion at home. Summary After the procedure, it is common to feel sleepy, have impaired judgment, or feel nauseous if you eat too soon. Rest after you get home. Know the things you should not do after the procedure. Follow the diet recommended by your health care provider and drink enough fluid to keep your urine pale yellow. Get help right away if you have trouble breathing or new-onset confusion at home. This information is not intended to replace advice given to you by your health care provider. Make sure you discuss any questions you have with your health care provider. Document Revised: 07/16/2019 Document Reviewed: 02/11/2019 Elsevier Patient Education  2021 [employer].

## 2023-05-13 NOTE — Procedures (Signed)
Vascular and Interventional Radiology Procedure Note  Patient: Elijah [name] DOB: [birth year] Medical Record Number: [id number] Note Date/Time: 05/13/23 2:39 PM   Performing Physician: Roanna Banning, MD Assistant(s): None  Diagnosis: Lymphoma  Procedure: PORT PLACEMENT  Anesthesia: Conscious Sedation Complications: None Estimated Blood Loss: Minimal  Findings:  Successful right-sided port placement, with the tip of the catheter in the proximal right atrium.  Plan: Catheter ready for use.  See detailed procedure note with images in PACS. The patient tolerated the procedure well without incident or complication and was returned to Recovery in stable condition.    Roanna Banning, MD [facility]   Pager. [phone] Clinic. [phone]

## 2023-05-13 NOTE — Progress Notes (Signed)
 Office Visit Note  Patient: Elijah [name]             Date of Birth: November 08, 1955           MRN: [id number]             PCP: Tally Joe, MD Referring: Tally Joe, MD Visit Date: 05/27/2023 Occupation: @GUAROCC @  Subjective:  Elevated sed rate and muscle pain  History of Present Illness: Elijah [name] is a 68 y.o. male with polymyalgia rheumatica and Raynauds.  He returns today after his last visit on January 22.  He has been tapering prednisone and has been on prednisone 15 mg daily about 1 week.  He started chemotherapy yesterday and he is currently on prednisone 60 mg daily for 5 days.  The plan is to taper prednisone by 2.5 mg every 2 weeks until he reaches prednisone 10 mg p.o. daily.  He denies any joint pain or joint swelling.  He denies any symptoms of Raynauds currently.  No muscular weakness or muscle tenderness.  He still on antibiotics for the next 2 days.  He has a wound VAC now.  He was accompanied by his wife today.    Activities of Daily Living:  Patient reports morning stiffness for 0 minute.   Patient Reports nocturnal pain.  Difficulty dressing/grooming: Denies Difficulty climbing stairs: Denies Difficulty getting out of chair: Denies Difficulty using hands for taps, buttons, cutlery, and/or writing: Denies  Review of Systems  Constitutional:  Positive for fatigue.  HENT:  Negative for mouth sores and mouth dryness.   Eyes:  Positive for dryness.  Respiratory:  Negative for shortness of breath.   Cardiovascular:  Negative for chest pain and palpitations.  Gastrointestinal:  Negative for blood in stool, constipation and diarrhea.  Endocrine: Negative for increased urination.  Genitourinary:  Negative for involuntary urination.  Musculoskeletal:  Positive for joint pain and joint pain. Negative for gait problem, joint swelling, myalgias, muscle weakness, morning stiffness, muscle tenderness and myalgias.  Skin:  Negative for color change, rash, hair loss  and sensitivity to sunlight.  Allergic/Immunologic: Negative for susceptible to infections.  Neurological:  Negative for dizziness and headaches.  Hematological:  Negative for swollen glands.  Psychiatric/Behavioral:  Negative for depressed mood and sleep disturbance. The patient is not nervous/anxious.     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) 04/23/2023   Small bowel perforation (HCC) 04/10/2023   Gastroesophageal reflux disease without esophagitis 04/09/2023   Allergic rhinitis due to allergen 04/09/2023   Severe major depression (HCC) 04/09/2023   Obstructive sleep apnea 04/09/2023   Daytime somnolence 04/09/2023   Exercise-induced asthma 04/09/2023    Past Medical History:  Diagnosis Date   Acid reflux    Allergic rhinitis    Asthma    Bloating    BPH (benign prostatic hyperplasia)    Depression    ED (erectile dysfunction)    Epigastric pain    Exercise-induced asthma    GERD (gastroesophageal reflux disease)    History of kidney stones    Hypercholesteremia    Migraine 2009   Polymyalgia rheumatica (HCC)    Prediabetes    started on metformin during january hospitalization   Prostate disease    Sleep apnea     Family History  Problem Relation Age of Onset   Healthy Son    Healthy Daughter  Past Surgical History:  Procedure Laterality Date   ANKLE SURGERY Right    BOWEL RESECTION N/A 04/10/2023   Procedure: SMALL BOWEL RESECTION WITH MASS;  Surgeon: Moise Boring, MD;  Location: [facility] OR;  Service: General;  Laterality: N/A;   CHEST TUBE INSERTION Left    COLONOSCOPY     CYSTOSCOPY WITH RETROGRADE PYELOGRAM, URETEROSCOPY AND STENT PLACEMENT Left 06/29/2020   Procedure: CYSTOSCOPY WITH RETROGRADE PYELOGRAM, URETEROSCOPY , BASKET EXTRACTION, AND STENT PLACEMENT;  Surgeon: Sebastian Ache, MD;  Location: [facility];  Service:  Urology;  Laterality: Left;  1 HR   IR IMAGING GUIDED PORT INSERTION  05/13/2023   KNEE SURGERY Right    LAPAROTOMY N/A 04/10/2023   Procedure: EXPLORATORY LAPAROTOMY;  Surgeon: Moise Boring, MD;  Location: [facility] OR;  Service: General;  Laterality: N/A;   SKIN CANCER EXCISION     unsure   TONSILLECTOMY     VASECTOMY     WISDOM TOOTH EXTRACTION     Social History   Social History Narrative   Not on file    There is no immunization history on file for this patient.   Objective: Vital Signs: BP 127/80 (BP Location: Left Arm, Patient Position: Sitting, Cuff Size: Large)   Pulse 85   Resp 16   Ht 6\' 1"  (1.854 m)   Wt 231 lb (104.8 kg)   BMI 30.48 kg/m    Physical Exam Vitals and nursing note reviewed.  Constitutional:      Appearance: He is well-developed.  HENT:     Head: Normocephalic and atraumatic.  Eyes:     Conjunctiva/sclera: Conjunctivae normal.     Pupils: Pupils are equal, round, and reactive to light.  Cardiovascular:     Rate and Rhythm: Normal rate and regular rhythm.     Heart sounds: Normal heart sounds.  Pulmonary:     Effort: Pulmonary effort is normal.     Breath sounds: Normal breath sounds.  Abdominal:     General: Bowel sounds are normal.     Palpations: Abdomen is soft.  Musculoskeletal:     Cervical back: Normal range of motion and neck supple.  Skin:    General: Skin is warm and dry.     Capillary Refill: Capillary refill takes 2 to 3 seconds.  Neurological:     Mental Status: He is alert and oriented to person, place, and time.     Comments: Tremors  Psychiatric:        Behavior: Behavior normal.      Musculoskeletal Exam: Cervical spine was in good range of motion.  Shoulders, elbows, wrist joints, MCPs PIPs and DIPs been good range of motion without any discomfort.  Hip joints and knee joints in good range of motion.  There was no tenderness over ankles or MTPs.  No muscular weakness or tenderness was noted.  CDAI Exam: CDAI Score:  -- Patient Global: --; Provider Global: -- Swollen: --; Tender: -- Joint Exam 05/27/2023   No joint exam has been documented for this visit   There is currently no information documented on the homunculus. Go to the Rheumatology activity and complete the homunculus joint exam.  Investigation: No additional findings.  Imaging: IR IMAGING GUIDED PORT INSERTION Result Date: 05/13/2023 INDICATION: port for chemo.  History of T-cell lymphoma. EXAM: IMPLANTED PORT A CATH PLACEMENT WITH ULTRASOUND AND FLUOROSCOPIC GUIDANCE MEDICATIONS: Ancef 2 gm IV; The antibiotic was administered within an appropriate time interval prior to skin puncture. ANESTHESIA/SEDATION: Moderate (conscious) sedation was  employed during this procedure. A total of Versed 3.5 mg and Fentanyl 100 mcg was administered intravenously. Moderate Sedation Time: 16 minutes. The patient's level of consciousness and vital signs were monitored continuously by radiology nursing throughout the procedure under my direct supervision. FLUOROSCOPY TIME:  Fluoroscopic dose; 0 mGy COMPLICATIONS: None immediate. PROCEDURE: The procedure, risks, benefits, and alternatives were explained to the patient. Questions regarding the procedure were encouraged and answered. The patient understands and consents to the procedure. The RIGHT neck and chest were prepped with chlorhexidine in a sterile fashion, and a sterile drape was applied covering the operative field. Maximum barrier sterile technique with sterile gowns and gloves were used for the procedure. A timeout was performed prior to the initiation of the procedure. Local anesthesia was provided with 1% lidocaine with epinephrine. After creating a small venotomy incision, a micropuncture kit was utilized to access the internal jugular vein under direct, real-time ultrasound guidance. Ultrasound image documentation was performed. The microwire was kinked to measure appropriate catheter length. A subcutaneous  port pocket was then created along the upper chest wall utilizing a combination of sharp and blunt dissection. The pocket was irrigated with sterile saline. A single lumen power injectable port was chosen for placement. The 8 Fr catheter was tunneled from the port pocket site to the venotomy incision. The port was placed in the pocket. The external catheter was trimmed to appropriate length. At the venotomy, an 8 Fr peel-away sheath was placed over a guidewire under fluoroscopic guidance. The catheter was then placed through the sheath and the sheath was removed. Final catheter positioning was confirmed and documented with a fluoroscopic spot radiograph. The port was accessed with a Huber needle, aspirated and flushed with heparinized saline. The port pocket incision was closed with interrupted 3-0 Vicryl suture then Dermabond was applied, including at the venotomy incision. Dressings were placed. The patient tolerated the procedure well without immediate post procedural complication. IMPRESSION: Successful placement of a RIGHT internal jugular approach power injectable Port-A-Cath. The tip of the catheter is positioned within the proximal RIGHT atrium. The catheter is ready for immediate use. Elijah Banning, MD [facility] Radiology Electronically Signed   By: Elijah [name] M.D.   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 * Tracking Code: BO * EXAM: CT ABDOMEN AND PELVIS WITH CONTRAST TECHNIQUE: Multidetector CT imaging of the abdomen and pelvis was performed using the standard protocol following bolus administration of intravenous contrast. RADIATION DOSE REDUCTION: This exam was performed according to the departmental dose-optimization program which includes automated exposure control, adjustment of the mA and/or kV according to patient size and/or use of  iterative reconstruction technique. CONTRAST:  OMNIPAQUE IOHEXOL 300 MG/ML  SOLN COMPARISON:  PET-CT on 05/02/2023 FINDINGS: Lower Chest: No acute findings. Hepatobiliary:  No suspicious hepatic masses identified. Pancreas:  No mass or inflammatory changes. Spleen: Within normal limits in size and appearance. Adrenals/Urinary Tract: No suspicious masses identified. No evidence of ureteral calculi or hydronephrosis. Stomach/Bowel: Large poorly defined mass with central necrosis is again seen in the left lower quadrant mesentery which shows involvement of small bowel loops. This measures 11.8 x 7.6 cm on image 73/2, compared to 10.7 x 7.4 cm at same level on previous study. Small adjacent mesenteric soft tissue nodules or lymph nodes are again seen, some showing mild increase in size since previous study. Index lesion measures 2.0 x  1.7 cm on image 71/2 compared to 1.7 x 1.2 cm on prior exam. Surgical drain remains in place. No No evidence of bowel obstruction. Vascular/Lymphatic: Mild increase in small mesenteric soft tissue nodules or lymph nodes, as described above. No acute vascular findings. Reproductive:  Stable mildly enlarged prostate. Other:  None. Musculoskeletal:  No suspicious bone lesions identified. IMPRESSION: Mild increase in size of large necrotic mass in left lower quadrant mesentery, which involves small bowel. Mild increase in size of adjacent small mesenteric soft tissue nodules or lymph nodes. Electronically Signed   By: Danae Orleans M.D.   On: 05/08/2023 14:39   ECHOCARDIOGRAM COMPLETE Result Date: 05/06/2023    ECHOCARDIOGRAM REPORT   Patient Name:   KENTO GOSSMAN Date of Exam: 05/06/2023 Medical Rec #:  [id number]        Height:       73.0 in Accession #:    [id number]       Weight:       236.5 lb Date of Birth:  1956-01-22         BSA:          2.311 m Patient Age:    67 years         BP:           155/81 mmHg Patient Gender: M                HR:           84 bpm. Exam Location:   Outpatient Procedure: 2D Echo, Pediatric Echo and Cardiac Doppler Indications:    Z51.11 Encounter for antineoplastic chemotheraphy  History:        Patient has no prior history of Echocardiogram examinations.                 Risk Factors:Sleep Apnea and Non-Smoker. Patient denies chest                 pain, SOB and leg edema. He currently has lymphoma and                 recovering from small bowel surgery 04/10/2023. Pre-chemotherapy                 schocardiogram needed.  Sonographer:    Carlos American RVT, RDCS (AE), RDMS Referring Phys: [id number] Briant Cedar  Sonographer Comments: Suboptimal apical window. Image acquisition challenging due to patient body habitus. Global longitudinal strain was attempted. IMPRESSIONS  1. Left ventricular ejection fraction, by estimation, is 65 to 70%. The left ventricle has normal function. The left ventricle has no regional wall motion abnormalities. There is mild left ventricular hypertrophy. Left ventricular diastolic parameters are consistent with Grade I diastolic dysfunction (impaired relaxation).  2. Right ventricular systolic function is normal. The right ventricular size is normal. Tricuspid regurgitation signal is inadequate for assessing PA pressure.  3. The mitral valve is degenerative. Trivial mitral valve regurgitation. No evidence of mitral stenosis.  4. The aortic valve is grossly normal. There is mild calcification of the aortic valve. Aortic valve regurgitation is not visualized. No aortic stenosis is present.  5. The inferior vena cava is normal in size with greater than 50% respiratory variability, suggesting right atrial pressure of 3 mmHg. FINDINGS  Left Ventricle: Left ventricular ejection fraction, by estimation, is 65 to 70%. The left ventricle has normal function. The left ventricle has no regional wall motion abnormalities. Global longitudinal strain performed but not reported based on interpreter judgement due  to suboptimal tracking. The left  ventricular internal cavity size was normal in size. There is mild left ventricular hypertrophy. Left ventricular diastolic parameters are consistent with Grade I diastolic dysfunction (impaired relaxation). Right Ventricle: The right ventricular size is normal. No increase in right ventricular wall thickness. Right ventricular systolic function is normal. Tricuspid regurgitation signal is inadequate for assessing PA pressure. Left Atrium: Left atrial size was normal in size. Right Atrium: Right atrial size was normal in size. Pericardium: There is no evidence of pericardial effusion. Mitral Valve: The mitral valve is degenerative in appearance. There is mild thickening of the mitral valve leaflet(s). Trivial mitral valve regurgitation. No evidence of mitral valve stenosis. Tricuspid Valve: The tricuspid valve is normal in structure. Tricuspid valve regurgitation is trivial. No evidence of tricuspid stenosis. Aortic Valve: The aortic valve is grossly normal. There is mild calcification of the aortic valve. Aortic valve regurgitation is not visualized. No aortic stenosis is present. Aortic valve mean gradient measures 3.5 mmHg. Aortic valve peak gradient measures 6.1 mmHg. Aortic valve area, by VTI measures 2.52 cm. Pulmonic Valve: The pulmonic valve was normal in structure. Pulmonic valve regurgitation is trivial. No evidence of pulmonic stenosis. Aorta: The aortic root is normal in size and structure. Ascending aorta measurements are within normal limits for age when indexed to body surface area. Venous: The inferior vena cava is normal in size with greater than 50% respiratory variability, suggesting right atrial pressure of 3 mmHg. IAS/Shunts: No atrial level shunt detected by color flow Doppler.  LEFT VENTRICLE PLAX 2D LVIDd:         3.66 cm   Diastology LVIDs:         2.27 cm   LV e' medial:    7.14 cm/s LV PW:         1.13 cm   LV E/e' medial:  6.1 LV IVS:        1.16 cm   LV e' lateral:   10.10 cm/s LVOT  diam:     1.87 cm   LV E/e' lateral: 4.3 LV SV:         44 LV SV Index:   19 LVOT Area:     2.75 cm  RIGHT VENTRICLE RV S prime:     16.10 cm/s TAPSE (M-mode): 3.1 cm LEFT ATRIUM             Index        RIGHT ATRIUM           Index LA diam:        3.69 cm 1.60 cm/m   RA Area:     13.30 cm LA Vol (A2C):   68.8 ml 29.78 ml/m  RA Volume:   25.70 ml  11.12 ml/m LA Vol (A4C):   49.1 ml 21.25 ml/m LA Biplane Vol: 57.5 ml 24.89 ml/m  AORTIC VALVE                    PULMONIC VALVE AV Area (Vmax):    2.11 cm     PV Vmax:       1.01 m/s AV Area (Vmean):   2.17 cm     PV Peak grad:  4.1 mmHg AV Area (VTI):     2.52 cm AV Vmax:           123.50 cm/s AV Vmean:          82.100 cm/s AV VTI:  0.174 m AV Peak Grad:      6.1 mmHg AV Mean Grad:      3.5 mmHg LVOT Vmax:         95.00 cm/s LVOT Vmean:        64.900 cm/s LVOT VTI:          0.160 m LVOT/AV VTI ratio: 0.92  AORTA Ao Root diam: 3.56 cm Ao Asc diam:  3.68 cm Ao Arch diam: 3.2 cm MITRAL VALVE MV Area (PHT): 3.46 cm    SHUNTS MV Decel Time: 219 msec    Systemic VTI:  0.16 m MV E velocity: 43.50 cm/s  Systemic Diam: 1.87 cm MV A velocity: 57.60 cm/s MV E/A ratio:  0.76 Elijah Brass MD Electronically signed by Elijah Brass MD Signature Date/Time: 05/06/2023/9:52:34 PM    Final    NM PET Image Initial (PI) Skull Base To Thigh Result Date: 05/02/2023 CLINICAL DATA:  Initial treatment strategy for new diagnosis monomorphic T-cell lymphoma of the intestine. Post partial resection. EXAM: NUCLEAR MEDICINE PET SKULL BASE TO THIGH TECHNIQUE: 11.6 mCi F-18 FDG was injected intravenously. Full-ring PET imaging was performed from the skull base to thigh after the radiotracer. CT data was obtained and used for attenuation correction and anatomic localization. Fasting blood glucose: 146 mg/dl COMPARISON:  CT 16/12/9602, 04/10/2023 FINDINGS: Mediastinal blood pool activity: SUV max 2.3 Liver activity: SUV max NA NECK: No hypermetabolic lymph nodes in the neck.  Incidental CT findings: None. CHEST: No hypermetabolic mediastinal or hilar nodes. No suspicious pulmonary nodules on the CT scan. Incidental CT findings: None. ABDOMEN/PELVIS: Within the LEFT central abdomen there is a thick-walled intensely hypermetabolic cavitary process at site of previously described cavitary mass. Cavitary mass has an intensely hypermetabolic thickened rim measuring 2 cm in thickness with SUV max equal 17 (image 67). The mass has central photopenia consistent with necrosis. Mass measures approximately 10.2 by 8. 2 cm in axial dimension (169/series 3). The mass is elongated in coronal projection measuring 18 cm. The findings are not dissimilar from the pre surgical CT. There is a percutaneous drainage catheter is extends through the central portion of the mass. The central portion of mass is photopenic consistent with necrosis. There is intense metabolic activity associated with the ascending, transverse and descending colon. No clear hypermetabolic adenopathy in the abdomen pelvis. Adenopathy in the pelvis. Incidental CT findings: None. SKELETON: No focal hypermetabolic activity to suggest skeletal metastasis. Incidental CT findings: None. IMPRESSION: 1. Intensely hypermetabolic thick-walled cavitary mass in the LEFT central abdomen following small-bowel resection for intestinal lymphoma. Residual thick-walled hypermetabolic mass either represents residual lymphoma or postoperative infection. Favor lymphoma. 2. Central photopenia consistent with necrosis. 3. Percutaneous drainage catheter extends through the central portion of the mass. 4. No evidence of hypermetabolic adenopathy in the abdomen pelvis. 5. No evidence of hypermetabolic metastatic disease in the chest or pelvis. 6. Intense metabolic activity associated with the ascending, transverse and descending colon is favored physiologic. Electronically Signed   By: Genevive Bi M.D.   On: 05/02/2023 16:16    Recent Labs: Lab  Results  Component Value Date   WBC 11.0 (H) 05/26/2023   HGB 12.0 (L) 05/26/2023   PLT 300 05/26/2023   NA 137 05/26/2023   K 4.2 05/26/2023   CL 104 05/26/2023   CO2 24 05/26/2023   GLUCOSE 239 (H) 05/26/2023   BUN 25 (H) 05/26/2023   CREATININE 1.12 05/26/2023   BILITOT 0.3 05/26/2023   ALKPHOS 57 05/26/2023   AST 14 (  L) 05/26/2023   ALT 16 05/26/2023   PROT 7.1 05/26/2023   ALBUMIN 3.8 05/26/2023   CALCIUM 9.7 05/26/2023   GFRAA  06/17/2007    >60        The eGFR has been calculated using the MDRD equation. This calculation has not been validated in all clinical   April 23, 2023 RNP negative, Katrinka Blazing negative, dsDNA negative, SSA negative, SSB negative, scleroderma panel negative, C3-C4 normal, anticardiolipin negative, beta-2 GP 1 negative, anti-CCP negative, ANCA negative, MPO negative, serum proteinase 3 negative, immunoglobulins normal, sed rate 46, CK 55, TSH 13.26, HMG CR antibody <2, cryoglobulins negative UA showed trace ketones, IFE abnormal, LDH 236, hepatitis B negative, hepatitis C nonreactive  October 29, 2022 creatinine 1.17, TSH normal, RF negative, RMSF negative, ANA negative, ESR 83, CPK 55   April 10, 2023 FINAL MICROSCOPIC DIAGNOSIS:   A. 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    Read by Dr. Melody Haver  Speciality Comments: No specialty comments available.  Procedures:  No procedures performed Allergies: Singulair [montelukast], Amlodipine, and Crestor [rosuvastatin]   Assessment / Plan:     Visit Diagnoses: Polymyalgia rheumatica (HCC) - History of pain in shoulders and hips.  Elevated sed rate.  He had good response to prednisone taper. -Presumptive diagnosis of prednisone based on history and elevated sedimentation rate in the past.  It is not unusual to see polymyalgia with malignancy.  I also discussed possibility of seronegative  chronic inflammatory arthritis.  No synovitis was noted on the examination today.  He has not had any increased muscular weakness or tenderness.  No joint pain was reported today.  Patient had been tapering prednisone and had been taking prednisone 15 mg daily for the last 1 week.  His prednisone was increased to 60 mg a day for 5 days for chemotherapy.  After that he will go back to his original dose of 15 mg p.o. daily.  The plan is to taper prednisone by 5 mg every 2 weeks until he reaches prednisone 10 mg daily.  After that he will reduce prednisone by 1 mg every month.  Plan: predniSONE (DELTASONE) 1 MG tablet.  Prescription for prednisone 1 mg tablets were sent for the future taper.  Elevated sed rate-repeat sedimentation rate was 46 on April 23, 2023.  Long term (current) use of systemic steroids - He was on prednisone 40 mg daily and was on prednisone 22.5 mg p.o. daily at the last visit.  He will go back to prednisone 15 mg daily which she started about a week ago after the taper.  Long-term side effects of prednisone including the increased risk of osteoporosis, weight gain, diabetes, hypertension, cataracts were discussed.  Chronic pain of both shoulders -he denies any discomfort today.  All autoimmune workup negative.April 23, 2023 RNP negative, Katrinka Blazing negative, dsDNA negative, SSA negative, SSB negative, scleroderma panel negative, C3-C4 normal, anticardiolipin negative, beta-2 GP 1 negative, anti-CCP negative, ANCA negative, MPO negative, serum proteinase 3 negative  Chronic pain of both hips-he denies any discomfort now.  Raynaud's syndrome without gangrene - He had cold hands and feet.  Decreased capillary refill was noted.  Scleroderma panel negative.  All autoimmune labs were negative.  Lab results were discussed with the patient at length.cryoglobulins negative UA showed trace ketones, IFE abnormal, LDH 236, hepatitis B negative, hepatitis C nonreactive  T-cell lymphoma (HCC) -  Pathology revealed CD3/CD8 positive T-cell  lymphoma favor monomorphic epitheliotropic intestinal T-cell lymphoma.  Patient started chemotherapy yesterday.  He is also on prednisone 50 mg daily.  Small bowel perforation (HCC) - Klebsiella pneumoniae peritonitis.  Status post abdominal mass resection, path report consistent with a small cell lymphoma.  He is currently on Augmentin.  Patient states he will finish antibiotics in 2 days.  He has a wound VAC.  Coarse tremors-tremors were noted on the examination today.  I discussed that it could be related to high-dose steroid use.  We will see how he responds to prednisone taper.  Elevated TSH-labs were forwarded to his PCP.  Other medical problems listed as follows:  Gastroesophageal reflux disease without esophagitis  Pure hypercholesterolemia  Elevated hemoglobin A1c  Exercise-induced asthma  Seasonal allergic rhinitis due to other allergic trigger  Benign prostatic hyperplasia without lower urinary tract symptoms - Followed by Dr. Berneice Heinrich  Severe major depression (HCC)  Daytime somnolence  Obstructive sleep apnea  Kidney stones  Orders: No orders of the defined types were placed in this encounter.  Meds ordered this encounter  Medications   predniSONE (DELTASONE) 1 MG tablet    Sig: Take 4 tablets by mouth daily, along with a 5mg  tablet for a total of 9mg  daily for 1 month. Taper by 1mg  monthly.    Dispense:  120 tablet    Refill:  0     Follow-Up Instructions: Return in about 4 months (around 09/24/2023) for PMR.   Pollyann Savoy, MD  Note - This record has been created using Animal [employer].  Chart creation errors have been sought, but may not always  have been located. Such creation errors do not reflect on  the standard of medical care.

## 2023-05-16 ENCOUNTER — Other Ambulatory Visit: Payer: Self-pay | Admitting: Hematology and Oncology

## 2023-05-16 NOTE — Progress Notes (Signed)
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

## 2023-05-17 ENCOUNTER — Other Ambulatory Visit: Payer: Self-pay

## 2023-05-19 ENCOUNTER — Other Ambulatory Visit (HOSPITAL_COMMUNITY): Payer: Self-pay

## 2023-05-19 ENCOUNTER — Inpatient Hospital Stay: Payer: Medicare Other | Attending: Physician Assistant | Admitting: Hematology and Oncology

## 2023-05-19 ENCOUNTER — Inpatient Hospital Stay: Payer: Medicare Other

## 2023-05-19 ENCOUNTER — Other Ambulatory Visit: Payer: Self-pay

## 2023-05-19 ENCOUNTER — Other Ambulatory Visit: Payer: Self-pay | Admitting: Hematology and Oncology

## 2023-05-19 VITALS — BP 128/81 | HR 100 | Temp 98.3°F | Resp 18 | Ht 73.0 in | Wt 226.3 lb

## 2023-05-19 DIAGNOSIS — Z79899 Other long term (current) drug therapy: Secondary | ICD-10-CM | POA: Insufficient documentation

## 2023-05-19 DIAGNOSIS — C8449 Peripheral T-cell lymphoma, not classified, extranodal and solid organ sites: Secondary | ICD-10-CM

## 2023-05-19 DIAGNOSIS — R109 Unspecified abdominal pain: Secondary | ICD-10-CM | POA: Diagnosis not present

## 2023-05-19 DIAGNOSIS — C862 Enteropathy-type (intestinal) T-cell lymphoma not having achieved remission: Secondary | ICD-10-CM | POA: Insufficient documentation

## 2023-05-19 DIAGNOSIS — Z5111 Encounter for antineoplastic chemotherapy: Secondary | ICD-10-CM | POA: Insufficient documentation

## 2023-05-19 DIAGNOSIS — Z5189 Encounter for other specified aftercare: Secondary | ICD-10-CM | POA: Diagnosis not present

## 2023-05-19 DIAGNOSIS — Z95828 Presence of other vascular implants and grafts: Secondary | ICD-10-CM | POA: Insufficient documentation

## 2023-05-19 LAB — CMP (CANCER CENTER ONLY)
ALT: 20 U/L (ref 0–44)
AST: 14 U/L — ABNORMAL LOW (ref 15–41)
Albumin: 3.8 g/dL (ref 3.5–5.0)
Alkaline Phosphatase: 49 U/L (ref 38–126)
Anion gap: 9 (ref 5–15)
BUN: 20 mg/dL (ref 8–23)
CO2: 29 mmol/L (ref 22–32)
Calcium: 9.8 mg/dL (ref 8.9–10.3)
Chloride: 101 mmol/L (ref 98–111)
Creatinine: 1.15 mg/dL (ref 0.61–1.24)
GFR, Estimated: >60 mL/min (ref >60)
Glucose, Bld: 120 mg/dL — ABNORMAL HIGH (ref 70–99)
Potassium: 3.8 mmol/L (ref 3.5–5.1)
Sodium: 139 mmol/L (ref 135–145)
Total Bilirubin: 0.4 mg/dL (ref 0.0–1.2)
Total Protein: 6.6 g/dL (ref 6.5–8.1)

## 2023-05-19 LAB — CBC WITH DIFFERENTIAL (CANCER CENTER ONLY)
Abs Immature Granulocytes: 0.05 10*3/uL (ref 0.00–0.07)
Basophils Absolute: 0.1 10*3/uL (ref 0.0–0.1)
Basophils Relative: 1 %
Eosinophils Absolute: 0.4 10*3/uL (ref 0.0–0.5)
Eosinophils Relative: 5 %
HCT: 37.4 % — ABNORMAL LOW (ref 39.0–52.0)
Hemoglobin: 12.1 g/dL — ABNORMAL LOW (ref 13.0–17.0)
Immature Granulocytes: 1 %
Lymphocytes Relative: 17 %
Lymphs Abs: 1.5 10*3/uL (ref 0.7–4.0)
MCH: 27.8 pg (ref 26.0–34.0)
MCHC: 32.4 g/dL (ref 30.0–36.0)
MCV: 85.8 fL (ref 80.0–100.0)
Monocytes Absolute: 0.8 10*3/uL (ref 0.1–1.0)
Monocytes Relative: 9 %
Neutro Abs: 5.8 10*3/uL (ref 1.7–7.7)
Neutrophils Relative %: 67 %
Platelet Count: 321 10*3/uL (ref 150–400)
RBC: 4.36 MIL/uL (ref 4.22–5.81)
RDW: 14.9 % (ref 11.5–15.5)
WBC Count: 8.5 10*3/uL (ref 4.0–10.5)
nRBC: 0 % (ref 0.0–0.2)

## 2023-05-19 LAB — LACTATE DEHYDROGENASE: LDH: 229 U/L — ABNORMAL HIGH (ref 98–192)

## 2023-05-19 MED ORDER — PROCHLORPERAZINE MALEATE 10 MG PO TABS
10.0000 mg | ORAL_TABLET | Freq: Four times a day (QID) | ORAL | 0 refills | Status: DC | PRN
  Filled 2023-05-19: qty 30, 8d supply, fill #0

## 2023-05-19 MED ORDER — PREDNISONE 20 MG PO TABS
60.0000 mg | ORAL_TABLET | Freq: Every day | ORAL | 5 refills | Status: DC
  Filled 2023-05-19: qty 15, 5d supply, fill #0

## 2023-05-19 MED ORDER — LIDOCAINE-PRILOCAINE 2.5-2.5 % EX CREA
1.0000 | TOPICAL_CREAM | CUTANEOUS | 0 refills | Status: DC | PRN
  Filled 2023-05-19: qty 30, 30d supply, fill #0

## 2023-05-19 MED ORDER — HEPARIN SOD (PORK) LOCK FLUSH 100 UNIT/ML IV SOLN
500.0000 [IU] | Freq: Once | INTRAVENOUS | Status: AC
  Administered 2023-05-19: 500 [IU]

## 2023-05-19 MED ORDER — ONDANSETRON HCL 8 MG PO TABS
8.0000 mg | ORAL_TABLET | Freq: Three times a day (TID) | ORAL | 0 refills | Status: DC | PRN
  Filled 2023-05-19: qty 18, 21d supply, fill #0

## 2023-05-19 MED ORDER — ALLOPURINOL 300 MG PO TABS
300.0000 mg | ORAL_TABLET | Freq: Every day | ORAL | 1 refills | Status: DC
  Filled 2023-05-19: qty 90, 90d supply, fill #0
  Filled 2023-08-17: qty 90, 90d supply, fill #1

## 2023-05-19 MED ORDER — SODIUM CHLORIDE 0.9% FLUSH
10.0000 mL | Freq: Once | INTRAVENOUS | Status: AC
  Administered 2023-05-19: 10 mL

## 2023-05-19 NOTE — Progress Notes (Signed)
 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  CT abdomen/pelvis showed shows a mass in the left abdomen associated with the small bowel, along with pneumoperitoneum. Underwent exploratory laparotomy and small bowel resection. 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  04/23/2023: Established care with [facility].  Interval History:  Elijah [name] 68 y.o. male with medical history significant for T cell lymphoma who presents for a follow up visit. The patient's last visit was on 04/23/2023 at which time he established care. In the interim since the last visit his drain has been removed but his wound vac remains in place.   On exam today Elijah [name] reports the drain has been removed since the last visit and his wound is healing well.  He does continue to have the wound VAC in place.  He reports he did have a bout of pain yesterday which did resolve after taking pain medication.  He reports that he took 2 Oxy IR, Compazine, and Tylenol which was helpful.  He also took some senna and Gas-X.  He reports that he does feel a lot of distention and discomfort on the left side of his abdomen.  He reports otherwise he has had no fevers, chills, sweats, nausea, vomiting or diarrhea.  There is been no drainage from his wound sites.  Today we discussed options moving forward.  We discussed that starting chemotherapy was risky while he had an open wound with a wound VAC.  After discussing the risks and benefits he notes that he was  agreeable to proceeding with treatment well aware that his blood counts will drop and he is very susceptible to infection, particularly with his open wound.  We also noted that due to the progression of disease on the PET CT scan delaying for longer is also potentially risky.  After weighing the risks and benefits we were agreeable to proceeding with treatment next week.  MEDICAL HISTORY:  Past Medical History:  Diagnosis Date   Acid reflux    Allergic rhinitis    Asthma    Bloating    BPH (benign prostatic hyperplasia)    Depression    ED (erectile dysfunction)    Epigastric pain    Exercise-induced asthma    GERD (gastroesophageal reflux disease)    History of kidney stones    Hypercholesteremia    Migraine 2009   Polymyalgia rheumatica (HCC)    Prediabetes    started on metformin during january hospitalization   Prostate disease    Sleep apnea     SURGICAL HISTORY: Past Surgical History:  Procedure Laterality Date   ANKLE SURGERY Right    BOWEL RESECTION N/A 04/10/2023   Procedure: SMALL BOWEL RESECTION WITH MASS;  Surgeon: Moise Boring, MD;  Location: [facility] OR;  Service: General;  Laterality: N/A;   CHEST TUBE INSERTION Left    COLONOSCOPY     CYSTOSCOPY WITH RETROGRADE PYELOGRAM, URETEROSCOPY AND STENT PLACEMENT Left 06/29/2020   Procedure: CYSTOSCOPY WITH RETROGRADE PYELOGRAM, URETEROSCOPY , BASKET EXTRACTION, AND STENT PLACEMENT;  Surgeon: Sebastian Ache, MD;  Location: [facility] ORS;  Service: Urology;  Laterality: Left;  1 HR   IR IMAGING GUIDED PORT  INSERTION  05/13/2023   KNEE SURGERY Right    LAPAROTOMY N/A 04/10/2023   Procedure: EXPLORATORY LAPAROTOMY;  Surgeon: Moise Boring, MD;  Location: [facility] OR;  Service: General;  Laterality: N/A;   SKIN CANCER EXCISION     unsure   TONSILLECTOMY     VASECTOMY     WISDOM TOOTH EXTRACTION      SOCIAL HISTORY: Social History   Socioeconomic History   Marital status: Married    Spouse name: Not on file   Number of  children: Not on file   Years of education: Not on file   Highest education level: Not on file  Occupational History   Not on file  Tobacco Use   Smoking status: Never    Passive exposure: Never   Smokeless tobacco: Never  Vaping Use   Vaping status: Never Used  Substance and Sexual Activity   Alcohol use: Yes    Comment: social   Drug use: Never   Sexual activity: Not on file  Other Topics Concern   Not on file  Social History Narrative   Not on file   Social Drivers of Health   Financial Resource Strain: Not on file  Food Insecurity: No Food Insecurity (04/10/2023)   Hunger Vital Sign    Worried About Running Out of Food in the Last Year: Never true    Ran Out of Food in the Last Year: Never true  Transportation Needs: No Transportation Needs (04/10/2023)   PRAPARE - Administrator, Civil Service (Medical): No    Lack of Transportation (Non-Medical): No  Physical Activity: Not on file  Stress: Not on file  Social Connections: Not on file  Intimate Partner Violence: Not At Risk (04/10/2023)   Humiliation, Afraid, Rape, and Kick questionnaire    Fear of Current or Ex-Partner: No    Emotionally Abused: No    Physically Abused: No    Sexually Abused: No    FAMILY HISTORY: Family History  Problem Relation Age of Onset   Healthy Son    Healthy Daughter     ALLERGIES:  is allergic to singulair [montelukast], amlodipine, and crestor [rosuvastatin].  MEDICATIONS:  Current Outpatient Medications  Medication Sig Dispense Refill   allopurinol (ZYLOPRIM) 300 MG tablet Take 1 tablet (300 mg total) by mouth daily. 90 tablet 1   lidocaine-prilocaine (EMLA) cream Apply 1 Application topically as needed. 30 g 0   ondansetron (ZOFRAN) 8 MG tablet Take 1 tablet (8 mg total) by mouth every 8 (eight) hours as needed. 30 tablet 0   predniSONE (DELTASONE) 20 MG tablet Take 3 tablets (60 mg total) by mouth daily. Take on Day 1 of chemotherapy and continue for 5 full days.  Restart with each cycle. 15 tablet 5   prochlorperazine (COMPAZINE) 10 MG tablet Take 1 tablet (10 mg total) by mouth every 6 (six) hours as needed for nausea or vomiting. 30 tablet 0   Accu-Chek Softclix Lancets lancets Use daily before breakfast. 100 each 0   acetaminophen (TYLENOL) 325 MG tablet Take 2 tablets (650 mg total) by mouth every 6 (six) hours as needed.     albuterol (VENTOLIN HFA) 108 (90 Base) MCG/ACT inhaler Inhale 2 puffs into the lungs as needed (max of 12 puffs per day) 6.7 g 0   alfuzosin (UROXATRAL) 10 MG 24 hr tablet Take 1 tablet (10 mg total) by mouth daily. 90 tablet 1   Blood Glucose Monitoring Suppl (ACCU-CHEK GUIDE) w/Device KIT Use daily before  breakfast. 1 kit 0   CINNAMON PO Take 1 capsule by mouth daily.     finasteride (PROSCAR) 5 MG tablet Take 1 tablet (5 mg total) by mouth daily. 90 tablet 3   glucose blood (ACCU-CHEK GUIDE TEST) test strip Use daily before breakfast. 50 each 0   Glucose Blood (BLOOD GLUCOSE TEST STRIPS) STRP Use to check blood sugar before breakfast as directed 100 strip 0   methocarbamol (ROBAXIN) 500 MG tablet Take 2 tablets (1,000 mg total) by mouth every 8 (eight) hours as needed for muscle spasms. 60 tablet 1   minoxidil (LONITEN) 2.5 MG tablet Take 1 tablet (2.5 mg total) by mouth daily. 365 tablet 1   oxyCODONE (OXY IR/ROXICODONE) 5 MG immediate release tablet Take 1-2 tablets (5-10 mg total) by mouth every 6 (six) hours as needed for severe pain (pain score 7-10). 180 tablet 0   pantoprazole (PROTONIX) 40 MG tablet Take 1 tablet (40 mg total) by mouth daily 30 minutes to 1 hour before morning meal. 30 tablet 5   Probiotic TBEC Take 1 capsule by mouth daily.     sildenafil (REVATIO) 20 MG tablet Take 2-5 tablets (40-100 mg total) by mouth as directed as needed for sexual funtion. (Patient not taking: Reported on 04/23/2023) 90 tablet 0   tadalafil, PAH, (ADCIRCA) 20 MG tablet Take 1 tablet (20 mg total) by mouth every 3 (three) days as  needed for sexual activity (Patient not taking: Reported on 04/23/2023) 20 tablet 5   No current facility-administered medications for this visit.    REVIEW OF SYSTEMS:   Constitutional: ( - ) fevers, ( - )  chills , ( - ) night sweats Eyes: ( - ) blurriness of vision, ( - ) double vision, ( - ) watery eyes Ears, nose, mouth, throat, and face: ( - ) mucositis, ( - ) sore throat Respiratory: ( - ) cough, ( - ) dyspnea, ( - ) wheezes Cardiovascular: ( - ) palpitation, ( - ) chest discomfort, ( - ) lower extremity swelling Gastrointestinal:  ( - ) nausea, ( - ) heartburn, ( - ) change in bowel habits Skin: ( - ) abnormal skin rashes Lymphatics: ( - ) new lymphadenopathy, ( - ) easy bruising Neurological: ( - ) numbness, ( - ) tingling, ( - ) new weaknesses Behavioral/Psych: ( - ) mood change, ( - ) new changes  All other systems were reviewed with the patient and are negative.  PHYSICAL EXAMINATION: ECOG PERFORMANCE STATUS: 1 - Symptomatic but completely ambulatory  Vitals:   05/19/23 1031  BP: 128/81  Pulse: 100  Resp: 18  Temp: 98.3 F (36.8 C)  SpO2: 98%   Filed Weights   05/19/23 1031  Weight: 226 lb 4.8 oz (102.6 kg)    GENERAL: Well-appearing middle-age Caucasian male, alert, no distress and comfortable SKIN: skin color, texture, turgor are normal, no rashes or significant lesions EYES: conjunctiva are pink and non-injected, sclera clear LUNGS: clear to auscultation and percussion with normal breathing effort HEART: regular rate & rhythm and no murmurs and no lower extremity edema ABDOMEN: soft, non-tender, non-distended, normal bowel sounds.  Wound VAC in place in central abdomen, prior drain site covered dry and intact on left side of abdomen. Musculoskeletal: no cyanosis of digits and no clubbing  PSYCH: alert & oriented x 3, fluent speech NEURO: no focal motor/sensory deficits  LABORATORY DATA:  I have reviewed the data as listed    Latest Ref Rng & Units  05/19/2023  9:49 AM 04/23/2023    3:43 PM 04/15/2023    6:03 AM  CBC  WBC 4.0 - 10.5 K/uL 8.5  18.3  13.4   Hemoglobin 13.0 - 17.0 g/dL 16.1  09.6  04.5   Hematocrit 39.0 - 52.0 % 37.4  38.4  32.7   Platelets 150 - 400 K/uL 321  599  279        Latest Ref Rng & Units 05/19/2023    9:49 AM 04/23/2023    3:43 PM 04/23/2023    9:32 AM  CMP  Glucose 70 - 99 mg/dL 409  811    BUN 8 - 23 mg/dL 20  23    Creatinine 9.14 - 1.24 mg/dL 7.82  9.56    Sodium 213 - 145 mmol/L 139  137    Potassium 3.5 - 5.1 mmol/L 3.8  4.2    Chloride 98 - 111 mmol/L 101  99    CO2 22 - 32 mmol/L 29  30    Calcium 8.9 - 10.3 mg/dL 9.8  9.8    Total Protein 6.5 - 8.1 g/dL 6.6  7.3  6.8   Total Bilirubin 0.0 - 1.2 mg/dL 0.4  0.3    Alkaline Phos 38 - 126 U/L 49  74    AST 15 - 41 U/L 14  16    ALT 0 - 44 U/L 20  27      No results found for: "MPROTEIN" No results found for: "KPAFRELGTCHN", "LAMBDASER", "KAPLAMBRATIO"  RADIOGRAPHIC STUDIES: I have personally reviewed the radiological images as listed and agreed with the findings in the report. IR IMAGING GUIDED PORT INSERTION Result Date: 05/13/2023 INDICATION: port for chemo.  History of T-cell lymphoma. EXAM: IMPLANTED PORT A CATH PLACEMENT WITH ULTRASOUND AND FLUOROSCOPIC GUIDANCE MEDICATIONS: Ancef 2 gm IV; The antibiotic was administered within an appropriate time interval prior to skin puncture. ANESTHESIA/SEDATION: Moderate (conscious) sedation was employed during this procedure. A total of Versed 3.5 mg and Fentanyl 100 mcg was administered intravenously. Moderate Sedation Time: 16 minutes. The patient's level of consciousness and vital signs were monitored continuously by radiology nursing throughout the procedure under my direct supervision. FLUOROSCOPY TIME:  Fluoroscopic dose; 0 mGy COMPLICATIONS: None immediate. PROCEDURE: The procedure, risks, benefits, and alternatives were explained to the patient. Questions regarding the procedure were encouraged  and answered. The patient understands and consents to the procedure. The RIGHT neck and chest were prepped with chlorhexidine in a sterile fashion, and a sterile drape was applied covering the operative field. Maximum barrier sterile technique with sterile gowns and gloves were used for the procedure. A timeout was performed prior to the initiation of the procedure. Local anesthesia was provided with 1% lidocaine with epinephrine. After creating a small venotomy incision, a micropuncture kit was utilized to access the internal jugular vein under direct, real-time ultrasound guidance. Ultrasound image documentation was performed. The microwire was kinked to measure appropriate catheter length. A subcutaneous port pocket was then created along the upper chest wall utilizing a combination of sharp and blunt dissection. The pocket was irrigated with sterile saline. A single lumen power injectable port was chosen for placement. The 8 Fr catheter was tunneled from the port pocket site to the venotomy incision. The port was placed in the pocket. The external catheter was trimmed to appropriate length. At the venotomy, an 8 Fr peel-away sheath was placed over a guidewire under fluoroscopic guidance. The catheter was then placed through the sheath and the sheath was removed. Final  catheter positioning was confirmed and documented with a fluoroscopic spot radiograph. The port was accessed with a Huber needle, aspirated and flushed with heparinized saline. The port pocket incision was closed with interrupted 3-0 Vicryl suture then Dermabond was applied, including at the venotomy incision. Dressings were placed. The patient tolerated the procedure well without immediate post procedural complication. IMPRESSION: Successful placement of a RIGHT internal jugular approach power injectable Port-A-Cath. The tip of the catheter is positioned within the proximal RIGHT atrium. The catheter is ready for immediate use. Roanna Banning, MD  [facility] Rad[facility] Electronically Signed   By: Roanna Banning M.D.   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 * Tracking Code: BO * EXAM: CT ABDOMEN AND PELVIS WITH CONTRAST TECHNIQUE: Multidetector CT imaging of the abdomen and pelvis was performed using the standard protocol following bolus administration of intravenous contrast. RADIATION DOSE REDUCTION: This exam was performed according to the departmental dose-optimization program which includes automated exposure control, adjustment of the mA and/or kV according to patient size and/or use of iterative reconstruction technique. CONTRAST:  OMNIPAQUE IOHEXOL 300 MG/ML  SOLN COMPARISON:  PET-CT on 05/02/2023 FINDINGS: Lower Chest: No acute findings. Hepatobiliary:  No suspicious hepatic masses identified. Pancreas:  No mass or inflammatory changes. Spleen: Within normal limits in size and appearance. Adrenals/Urinary Tract: No suspicious masses identified. No evidence of ureteral calculi or hydronephrosis. Stomach/Bowel: Large poorly defined mass with central necrosis is again seen in the left lower quadrant mesentery which shows involvement of small bowel loops. This measures 11.8 x 7.6 cm on image 73/2, compared to 10.7 x 7.4 cm at same level on previous study. Small adjacent mesenteric soft tissue nodules or lymph nodes are again seen, some showing mild increase in size since previous study. Index lesion measures 2.0 x 1.7 cm on image 71/2 compared to 1.7 x 1.2 cm on prior exam. Surgical drain remains in place. No No evidence of bowel obstruction. Vascular/Lymphatic: Mild increase in small mesenteric soft tissue nodules or lymph nodes, as described above. No acute vascular findings. Reproductive:  Stable mildly enlarged prostate. Other:  None. Musculoskeletal:  No suspicious bone lesions identified.  IMPRESSION: Mild increase in size of large necrotic mass in left lower quadrant mesentery, which involves small bowel. Mild increase in size of adjacent small mesenteric soft tissue nodules or lymph nodes. Electronically Signed   By: Danae Orleans M.D.   On: 05/08/2023 14:39   ECHOCARDIOGRAM COMPLETE Result Date: 05/06/2023    ECHOCARDIOGRAM REPORT   Patient Name:   Elijah [name] Date of Exam: 05/06/2023 Medical Rec #:  [id number]        Height:       73.0 in Accession #:    [id number]       Weight:       236.5 lb Date of Birth:  08-Jan-1956         BSA:          2.311 m Patient Age:    67 years         BP:           155/81 mmHg Patient Gender: M                HR:           84 bpm. Exam Location:  Outpatient Procedure: 2D Echo, Pediatric Echo and Cardiac Doppler Indications:  Z51.11 Encounter for antineoplastic chemotheraphy  History:        Patient has no prior history of Echocardiogram examinations.                 Risk Factors:Sleep Apnea and Non-Smoker. Patient denies chest                 pain, SOB and leg edema. He currently has lymphoma and                 recovering from small bowel surgery 04/10/2023. Pre-chemotherapy                 schocardiogram needed.  Sonographer:    Carlos American RVT, RDCS (AE), RDMS Referring Phys: [id number] Briant Cedar  Sonographer Comments: Suboptimal apical window. Image acquisition challenging due to patient body habitus. Global longitudinal strain was attempted. IMPRESSIONS  1. Left ventricular ejection fraction, by estimation, is 65 to 70%. The left ventricle has normal function. The left ventricle has no regional wall motion abnormalities. There is mild left ventricular hypertrophy. Left ventricular diastolic parameters are consistent with Grade I diastolic dysfunction (impaired relaxation).  2. Right ventricular systolic function is normal. The right ventricular size is normal. Tricuspid regurgitation signal is inadequate for assessing PA pressure.  3. The mitral valve is  degenerative. Trivial mitral valve regurgitation. No evidence of mitral stenosis.  4. The aortic valve is grossly normal. There is mild calcification of the aortic valve. Aortic valve regurgitation is not visualized. No aortic stenosis is present.  5. The inferior vena cava is normal in size with greater than 50% respiratory variability, suggesting right atrial pressure of 3 mmHg. FINDINGS  Left Ventricle: Left ventricular ejection fraction, by estimation, is 65 to 70%. The left ventricle has normal function. The left ventricle has no regional wall motion abnormalities. Global longitudinal strain performed but not reported based on interpreter judgement due to suboptimal tracking. The left ventricular internal cavity size was normal in size. There is mild left ventricular hypertrophy. Left ventricular diastolic parameters are consistent with Grade I diastolic dysfunction (impaired relaxation). Right Ventricle: The right ventricular size is normal. No increase in right ventricular wall thickness. Right ventricular systolic function is normal. Tricuspid regurgitation signal is inadequate for assessing PA pressure. Left Atrium: Left atrial size was normal in size. Right Atrium: Right atrial size was normal in size. Pericardium: There is no evidence of pericardial effusion. Mitral Valve: The mitral valve is degenerative in appearance. There is mild thickening of the mitral valve leaflet(s). Trivial mitral valve regurgitation. No evidence of mitral valve stenosis. Tricuspid Valve: The tricuspid valve is normal in structure. Tricuspid valve regurgitation is trivial. No evidence of tricuspid stenosis. Aortic Valve: The aortic valve is grossly normal. There is mild calcification of the aortic valve. Aortic valve regurgitation is not visualized. No aortic stenosis is present. Aortic valve mean gradient measures 3.5 mmHg. Aortic valve peak gradient measures 6.1 mmHg. Aortic valve area, by VTI measures 2.52 cm. Pulmonic  Valve: The pulmonic valve was normal in structure. Pulmonic valve regurgitation is trivial. No evidence of pulmonic stenosis. Aorta: The aortic root is normal in size and structure. Ascending aorta measurements are within normal limits for age when indexed to body surface area. Venous: The inferior vena cava is normal in size with greater than 50% respiratory variability, suggesting right atrial pressure of 3 mmHg. IAS/Shunts: No atrial level shunt detected by color flow Doppler.  LEFT VENTRICLE PLAX 2D LVIDd:  3.66 cm   Diastology LVIDs:         2.27 cm   LV e' medial:    7.14 cm/s LV PW:         1.13 cm   LV E/e' medial:  6.1 LV IVS:        1.16 cm   LV e' lateral:   10.10 cm/s LVOT diam:     1.87 cm   LV E/e' lateral: 4.3 LV SV:         44 LV SV Index:   19 LVOT Area:     2.75 cm  RIGHT VENTRICLE RV S prime:     16.10 cm/s TAPSE (M-mode): 3.1 cm LEFT ATRIUM             Index        RIGHT ATRIUM           Index LA diam:        3.69 cm 1.60 cm/m   RA Area:     13.30 cm LA Vol (A2C):   68.8 ml 29.78 ml/m  RA Volume:   25.70 ml  11.12 ml/m LA Vol (A4C):   49.1 ml 21.25 ml/m LA Biplane Vol: 57.5 ml 24.89 ml/m  AORTIC VALVE                    PULMONIC VALVE AV Area (Vmax):    2.11 cm     PV Vmax:       1.01 m/s AV Area (Vmean):   2.17 cm     PV Peak grad:  4.1 mmHg AV Area (VTI):     2.52 cm AV Vmax:           123.50 cm/s AV Vmean:          82.100 cm/s AV VTI:            0.174 m AV Peak Grad:      6.1 mmHg AV Mean Grad:      3.5 mmHg LVOT Vmax:         95.00 cm/s LVOT Vmean:        64.900 cm/s LVOT VTI:          0.160 m LVOT/AV VTI ratio: 0.92  AORTA Ao Root diam: 3.56 cm Ao Asc diam:  3.68 cm Ao Arch diam: 3.2 cm MITRAL VALVE MV Area (PHT): 3.46 cm    SHUNTS MV Decel Time: 219 msec    Systemic VTI:  0.16 m MV E velocity: 43.50 cm/s  Systemic Diam: 1.87 cm MV A velocity: 57.60 cm/s MV E/A ratio:  0.76 Weston Brass MD Electronically signed by Weston Brass MD Signature Date/Time: 05/06/2023/9:52:34  PM    Final    NM PET Image Initial (PI) Skull Base To Thigh Result Date: 05/02/2023 CLINICAL DATA:  Initial treatment strategy for new diagnosis monomorphic T-cell lymphoma of the intestine. Post partial resection. EXAM: NUCLEAR MEDICINE PET SKULL BASE TO THIGH TECHNIQUE: 11.6 mCi F-18 FDG was injected intravenously. Full-ring PET imaging was performed from the skull base to thigh after the radiotracer. CT data was obtained and used for attenuation correction and anatomic localization. Fasting blood glucose: 146 mg/dl COMPARISON:  CT 30/86/5784, 04/10/2023 FINDINGS: Mediastinal blood pool activity: SUV max 2.3 Liver activity: SUV max NA NECK: No hypermetabolic lymph nodes in the neck. Incidental CT findings: None. CHEST: No hypermetabolic mediastinal or hilar nodes. No suspicious pulmonary nodules on the CT scan. Incidental CT findings: None. ABDOMEN/PELVIS: Within the LEFT  central abdomen there is a thick-walled intensely hypermetabolic cavitary process at site of previously described cavitary mass. Cavitary mass has an intensely hypermetabolic thickened rim measuring 2 cm in thickness with SUV max equal 17 (image 67). The mass has central photopenia consistent with necrosis. Mass measures approximately 10.2 by 8. 2 cm in axial dimension (169/series 3). The mass is elongated in coronal projection measuring 18 cm. The findings are not dissimilar from the pre surgical CT. There is a percutaneous drainage catheter is extends through the central portion of the mass. The central portion of mass is photopenic consistent with necrosis. There is intense metabolic activity associated with the ascending, transverse and descending colon. No clear hypermetabolic adenopathy in the abdomen pelvis. Adenopathy in the pelvis. Incidental CT findings: None. SKELETON: No focal hypermetabolic activity to suggest skeletal metastasis. Incidental CT findings: None. IMPRESSION: 1. Intensely hypermetabolic thick-walled cavitary mass in  the LEFT central abdomen following small-bowel resection for intestinal lymphoma. Residual thick-walled hypermetabolic mass either represents residual lymphoma or postoperative infection. Favor lymphoma. 2. Central photopenia consistent with necrosis. 3. Percutaneous drainage catheter extends through the central portion of the mass. 4. No evidence of hypermetabolic adenopathy in the abdomen pelvis. 5. No evidence of hypermetabolic metastatic disease in the chest or pelvis. 6. Intense metabolic activity associated with the ascending, transverse and descending colon is favored physiologic. Electronically Signed   By: Genevive Bi M.D.   On: 05/02/2023 16:16    ASSESSMENT & PLAN Elijah [name] 68 y.o. male with medical history significant for T cell lymphoma who presents for a follow up visit.   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 Mesenteric margin is positive.  --Discussed MEITL is a rare subtype of peripheral T-cell lymphoma, approximately less than 5% of cases.  --Reviewed that the treatment strategy will include CHOP based chemotherapy. Reviewed frequency and common side effects.  -- baseline PET imaging complete on 05/02/2023.  PLAN: --Labs today show white blood cell 8.5, hemoglobin 12.1, MCV 85.8, platelets 321.  Creatinine and LFTs within normal limits. --Plan to start chemotherapy once surgical wound has healed, need clearance from surgical team. --RTC next week for anticipated start of treatment.   #Abdominal pain: --Improving after surgery.  --Currently on oxycodone 5-10 mg q 4 hours, refill sent today.  #Supportive Care -- chemotherapy education to be scheduled  -- port placed --TTE  complete, adequate for treatment.  -- zofran 8mg  q8H PRN and compazine 10mg  PO q6H for nausea -- acyclovir 400mg  PO BID for VCZ prophylaxis -- allopurinol 300mg  PO daily for TLS prophylaxis -- EMLA cream for port   Orders Placed This Encounter  Procedures   CBC with Differential (Cancer Center Only)    Standing Status:   Future    Expected Date:   05/26/2023    Expiration Date:   05/25/2024   CMP (Cancer Center only)    Standing Status:   Future    Expected Date:   05/26/2023    Expiration Date:   05/25/2024    All questions were answered. The patient knows to call the clinic with any problems, questions or concerns.  A total of more than 30 minutes were spent on this encounter with face-to-face time and non-face-to-face time, including preparing to see the patient, ordering tests and/or medications, counseling the patient and coordination of care  as outlined above.   Ulysees Barns, MD Department of Hematology/Oncology [facility] at [facility] Phone: [phone] Pager: [phone] Email: [email or web address]  05/19/2023 4:33 PM

## 2023-05-21 ENCOUNTER — Other Ambulatory Visit (HOSPITAL_COMMUNITY): Payer: Self-pay

## 2023-05-22 ENCOUNTER — Other Ambulatory Visit: Payer: Self-pay | Admitting: Hematology and Oncology

## 2023-05-22 ENCOUNTER — Other Ambulatory Visit: Payer: Self-pay | Admitting: *Deleted

## 2023-05-22 ENCOUNTER — Other Ambulatory Visit (HOSPITAL_COMMUNITY): Payer: Self-pay

## 2023-05-22 DIAGNOSIS — C8449 Peripheral T-cell lymphoma, not classified, extranodal and solid organ sites: Secondary | ICD-10-CM

## 2023-05-22 MED ORDER — DEXAMETHASONE 20 MG PO TABS
60.0000 mg | ORAL_TABLET | Freq: Every day | ORAL | 5 refills | Status: DC
  Filled 2023-05-22: qty 15, 5d supply, fill #0

## 2023-05-23 ENCOUNTER — Inpatient Hospital Stay: Payer: Medicare Other

## 2023-05-23 MED FILL — Fosaprepitant Dimeglumine For IV Infusion 150 MG (Base Eq): INTRAVENOUS | Qty: 5 | Status: AC

## 2023-05-24 ENCOUNTER — Other Ambulatory Visit: Payer: Self-pay

## 2023-05-26 ENCOUNTER — Inpatient Hospital Stay: Payer: Medicare Other | Admitting: Nutrition

## 2023-05-26 ENCOUNTER — Inpatient Hospital Stay: Payer: Medicare Other

## 2023-05-26 ENCOUNTER — Other Ambulatory Visit: Payer: Self-pay | Admitting: Hematology and Oncology

## 2023-05-26 VITALS — BP 146/86 | HR 81 | Temp 97.6°F | Resp 17 | Wt 227.2 lb

## 2023-05-26 DIAGNOSIS — Z95828 Presence of other vascular implants and grafts: Secondary | ICD-10-CM

## 2023-05-26 DIAGNOSIS — C8449 Peripheral T-cell lymphoma, not classified, extranodal and solid organ sites: Secondary | ICD-10-CM

## 2023-05-26 DIAGNOSIS — Z5111 Encounter for antineoplastic chemotherapy: Secondary | ICD-10-CM | POA: Diagnosis not present

## 2023-05-26 LAB — CMP (CANCER CENTER ONLY)
ALT: 16 U/L (ref 0–44)
AST: 14 U/L — ABNORMAL LOW (ref 15–41)
Albumin: 3.8 g/dL (ref 3.5–5.0)
Alkaline Phosphatase: 57 U/L (ref 38–126)
Anion gap: 9 (ref 5–15)
BUN: 25 mg/dL — ABNORMAL HIGH (ref 8–23)
CO2: 24 mmol/L (ref 22–32)
Calcium: 9.7 mg/dL (ref 8.9–10.3)
Chloride: 104 mmol/L (ref 98–111)
Creatinine: 1.12 mg/dL (ref 0.61–1.24)
GFR, Estimated: >60 mL/min (ref >60)
Glucose, Bld: 239 mg/dL — ABNORMAL HIGH (ref 70–99)
Potassium: 4.2 mmol/L (ref 3.5–5.1)
Sodium: 137 mmol/L (ref 135–145)
Total Bilirubin: 0.3 mg/dL (ref 0.0–1.2)
Total Protein: 7.1 g/dL (ref 6.5–8.1)

## 2023-05-26 LAB — CBC WITH DIFFERENTIAL (CANCER CENTER ONLY)
Abs Immature Granulocytes: 0.06 10*3/uL (ref 0.00–0.07)
Basophils Absolute: 0 10*3/uL (ref 0.0–0.1)
Basophils Relative: 0 %
Eosinophils Absolute: 0.2 10*3/uL (ref 0.0–0.5)
Eosinophils Relative: 2 %
HCT: 38 % — ABNORMAL LOW (ref 39.0–52.0)
Hemoglobin: 12 g/dL — ABNORMAL LOW (ref 13.0–17.0)
Immature Granulocytes: 1 %
Lymphocytes Relative: 5 %
Lymphs Abs: 0.5 10*3/uL — ABNORMAL LOW (ref 0.7–4.0)
MCH: 27.9 pg (ref 26.0–34.0)
MCHC: 31.6 g/dL (ref 30.0–36.0)
MCV: 88.4 fL (ref 80.0–100.0)
Monocytes Absolute: 0.2 10*3/uL (ref 0.1–1.0)
Monocytes Relative: 2 %
Neutro Abs: 9.9 10*3/uL — ABNORMAL HIGH (ref 1.7–7.7)
Neutrophils Relative %: 90 %
Platelet Count: 300 10*3/uL (ref 150–400)
RBC: 4.3 MIL/uL (ref 4.22–5.81)
RDW: 15.3 % (ref 11.5–15.5)
WBC Count: 11 10*3/uL — ABNORMAL HIGH (ref 4.0–10.5)
nRBC: 0 % (ref 0.0–0.2)

## 2023-05-26 MED ORDER — SODIUM CHLORIDE 0.9 % IV SOLN
750.0000 mg/m2 | Freq: Once | INTRAVENOUS | Status: AC
  Administered 2023-05-26: 1740 mg via INTRAVENOUS
  Filled 2023-05-26: qty 87

## 2023-05-26 MED ORDER — HEPARIN SOD (PORK) LOCK FLUSH 100 UNIT/ML IV SOLN
500.0000 [IU] | Freq: Once | INTRAVENOUS | Status: AC | PRN
  Administered 2023-05-26: 500 [IU]

## 2023-05-26 MED ORDER — DOXORUBICIN HCL CHEMO IV INJECTION 2 MG/ML
50.0000 mg/m2 | Freq: Once | INTRAVENOUS | Status: AC
  Administered 2023-05-26: 116 mg via INTRAVENOUS
  Filled 2023-05-26: qty 58

## 2023-05-26 MED ORDER — SODIUM CHLORIDE 0.9% FLUSH
10.0000 mL | INTRAVENOUS | Status: DC | PRN
  Administered 2023-05-26: 10 mL

## 2023-05-26 MED ORDER — SODIUM CHLORIDE 0.9% FLUSH
10.0000 mL | Freq: Once | INTRAVENOUS | Status: AC
Start: 2023-05-26 — End: 2023-05-26
  Administered 2023-05-26: 10 mL

## 2023-05-26 MED ORDER — SODIUM CHLORIDE 0.9 % IV SOLN
150.0000 mg | Freq: Once | INTRAVENOUS | Status: AC
  Administered 2023-05-26: 150 mg via INTRAVENOUS
  Filled 2023-05-26: qty 150

## 2023-05-26 MED ORDER — SODIUM CHLORIDE 0.9 % IV SOLN
2.0000 mg | Freq: Once | INTRAVENOUS | Status: AC
  Administered 2023-05-26: 2 mg via INTRAVENOUS
  Filled 2023-05-26: qty 2

## 2023-05-26 MED ORDER — DEXAMETHASONE SODIUM PHOSPHATE 10 MG/ML IJ SOLN
10.0000 mg | Freq: Once | INTRAMUSCULAR | Status: AC
  Administered 2023-05-26: 10 mg via INTRAVENOUS
  Filled 2023-05-26: qty 1

## 2023-05-26 MED ORDER — PALONOSETRON HCL INJECTION 0.25 MG/5ML
0.2500 mg | Freq: Once | INTRAVENOUS | Status: AC
  Administered 2023-05-26: 0.25 mg via INTRAVENOUS
  Filled 2023-05-26: qty 5

## 2023-05-26 MED ORDER — SODIUM CHLORIDE 0.9 % IV SOLN: INTRAVENOUS | Status: DC

## 2023-05-26 NOTE — Progress Notes (Signed)
 Richarda Overlie, MD  Claudean Kinds This ordered should be canceled, discussed with Dr. Hillery Hunter.  [place]       Previous Messages    ----- Message ----- From: Claudean Kinds Sent: 05/26/2023   9:55 AM EST To: Claudean Kinds; Ir Procedure Requests Subject: US BIOPSY (ABDOMINAL RETROPERTIONEAL MASS)    Procedure : US BIOPSY (ABDOMINAL RETROPERTIONEAL MASS)  Reason : please Eval for drain versus biopsy of intra-abdominal collection or mass Dx: Generalized intra-abdominal and pelvic swelling, mass and lump [R19.07 (ICD-10-CM)]  History :  NM PET Image Initial (PI) Skull Base To Thigh ,CT ABDOMEN PELVIS W CONTRAST ,DG Abd Portable 1 View,CT ABDOMEN PELVIS W CONTRAST ---- repeat CT abdomen pelv w/ done on 05/08/23  Provider: Moise Boring, MD   Provider contact : [phone]

## 2023-05-26 NOTE — Patient Instructions (Signed)
 [facility] - A DEPT OF [facility]  Discharge Instructions: Thank you for choosing [facility] to provide your oncology and hematology care.   If you have a lab appointment with the Cancer Center, please go directly to the Cancer Center and check in at the registration area.   Wear comfortable clothing and clothing appropriate for easy access to any Portacath or PICC line.   We strive to give you quality time with your provider. You may need to reschedule your appointment if you arrive late (15 or more minutes).  Arriving late affects you and other patients whose appointments are after yours.  Also, if you miss three or more appointments without notifying the office, you may be dismissed from the clinic at the provider's discretion.      For prescription refill requests, have your pharmacy contact our office and allow 72 hours for refills to be completed.    Today you received the following chemotherapy and/or immunotherapy agents: Doxorubicin, Vincristine, and Cyclophosphamide ( Cytoxan).      To help prevent nausea and vomiting after your treatment, we encourage you to take your nausea medication as directed.  BELOW ARE SYMPTOMS THAT SHOULD BE REPORTED IMMEDIATELY: *FEVER GREATER THAN 100.4 F (38 C) OR HIGHER *CHILLS OR SWEATING *NAUSEA AND VOMITING THAT IS NOT CONTROLLED WITH YOUR NAUSEA MEDICATION *UNUSUAL SHORTNESS OF BREATH *UNUSUAL BRUISING OR BLEEDING *URINARY PROBLEMS (pain or burning when urinating, or frequent urination) *BOWEL PROBLEMS (unusual diarrhea, constipation, pain near the anus) TENDERNESS IN MOUTH AND THROAT WITH OR WITHOUT PRESENCE OF ULCERS (sore throat, sores in mouth, or a toothache) UNUSUAL RASH, SWELLING OR PAIN  UNUSUAL VAGINAL DISCHARGE OR ITCHING   Items with * indicate a potential emergency and should be followed up as soon as possible or go to the Emergency Department if any problems should occur.  Please  show the CHEMOTHERAPY ALERT CARD or IMMUNOTHERAPY ALERT CARD at check-in to the Emergency Department and triage nurse.  Should you have questions after your visit or need to cancel or reschedule your appointment, please contact [facility] - A DEPT OF [facility]  Dept: [phone]  and follow the prompts.  Office hours are 8:00 a.m. to 4:30 p.m. Monday - Friday. Please note that voicemails left after 4:00 p.m. may not be returned until the following business day.  We are closed weekends and major holidays. You have access to a nurse at all times for urgent questions. Please call the main number to the clinic Dept: [phone] and follow the prompts.   For any non-urgent questions, you may also contact your provider using MyChart. We now offer e-Visits for anyone 53 and older to request care online for non-urgent symptoms. For details visit [email or web address].   Also download the MyChart app! Go to the app store, search "MyChart", open the app, select [facility], and log in with your MyChart username and password.  Doxorubicin Injection What is this medication? DOXORUBICIN (dox oh ROO bi sin) treats some types of cancer. It works by slowing down the growth of cancer cells. This medicine may be used for other purposes; ask your health care provider or pharmacist if you have questions. COMMON BRAND NAME(S): Adriamycin, Adriamycin PFS, Adriamycin RDF, Rubex What should I tell my care team before I take this medication? They need to know if you have any of these conditions: Heart disease History of low blood cell levels caused  by a medication Liver disease Recent or ongoing radiation An unusual or allergic reaction to doxorubicin, other medications, foods, dyes, or preservatives If you or your partner are pregnant or trying to get pregnant Breast-feeding How should I use this medication? This medication is injected into a vein. It is given by your care team  in a hospital or clinic setting. Talk to your care team about the use of this medication in children. Special care may be needed. Overdosage: If you think you have taken too much of this medicine contact a poison control center or emergency room at once. NOTE: This medicine is only for you. Do not share this medicine with others. What if I miss a dose? Keep appointments for follow-up doses. It is important not to miss your dose. Call your care team if you are unable to keep an appointment. What may interact with this medication? 6-mercaptopurine Paclitaxel Phenytoin St. John's wort Trastuzumab Verapamil This list may not describe all possible interactions. Give your health care provider a list of all the medicines, herbs, non-prescription drugs, or dietary supplements you use. Also tell them if you smoke, drink alcohol, or use illegal drugs. Some items may interact with your medicine. What should I watch for while using this medication? Your condition will be monitored carefully while you are receiving this medication. You may need blood work while taking this medication. This medication may make you feel generally unwell. This is not uncommon as chemotherapy can affect healthy cells as well as cancer cells. Report any side effects. Continue your course of treatment even though you feel ill unless your care team tells you to stop. There is a maximum amount of this medication you should receive throughout your life. The amount depends on the medical condition being treated and your overall health. Your care team will watch how much of this medication you receive. Tell your care team if you have taken this medication before. Your urine may turn red for a few days after your dose. This is not blood. If your urine is dark or brown, call your care team. In some cases, you may be given additional medications to help with side effects. Follow all directions for their use. This medication may increase  your risk of getting an infection. Call your care team for advice if you get a fever, chills, sore throat, or other symptoms of a cold or flu. Do not treat yourself. Try to avoid being around people who are sick. This medication may increase your risk to bruise or bleed. Call your care team if you notice any unusual bleeding. Talk to your care team about your risk of cancer. You may be more at risk for certain types of cancers if you take this medication. Talk to your care team if you or your partner may be pregnant. Serious birth defects can occur if you take this medication during pregnancy and for 6 months after the last dose. Contraception is recommended while taking this medication and for 6 months after the last dose. Your care team can help you find the option that works for you. If your partner can get pregnant, use a condom while taking this medication and for 6 months after the last dose. Do not breastfeed while taking this medication. This medication may cause infertility. Talk to your care team if you are concerned about your fertility. What side effects may I notice from receiving this medication? Side effects that you should report to your care team as soon as  possible: Allergic reactions--skin rash, itching, hives, swelling of the face, lips, tongue, or throat Heart failure--shortness of breath, swelling of the ankles, feet, or hands, sudden weight gain, unusual weakness or fatigue Heart rhythm changes--fast or irregular heartbeat, dizziness, feeling faint or lightheaded, chest pain, trouble breathing Infection--fever, chills, cough, sore throat, wounds that don't heal, pain or trouble when passing urine, general feeling of discomfort or being unwell Low red blood cell level--unusual weakness or fatigue, dizziness, headache, trouble breathing Painful swelling, warmth, or redness of the skin, blisters or sores at the infusion site Unusual bruising or bleeding Side effects that usually  do not require medical attention (report to your care team if they continue or are bothersome): Diarrhea Hair loss Nausea Pain, redness, or swelling with sores inside the mouth or throat Red urine This list may not describe all possible side effects. Call your doctor for medical advice about side effects. You may report side effects to FDA at 1-800-FDA-1088. Where should I keep my medication? This medication is given in a hospital or clinic. It will not be stored at home. NOTE: This sheet is a summary. It may not cover all possible information. If you have questions about this medicine, talk to your doctor, pharmacist, or health care provider.  2024 Elsevier/Gold Standard (2022-06-20 00:00:00) Vincristine Injection What is this medication? VINCRISTINE (vin KRIS teen) treats some types of cancer. It works by slowing down the growth of cancer cells. This medicine may be used for other purposes; ask your health care provider or pharmacist if you have questions. COMMON BRAND NAME(S): Oncovin, Vincasar PFS What should I tell my care team before I take this medication? They need to know if you have any of these conditions: Infection Kidney disease Liver disease Low white blood cell levels Lung disease Nervous system disease, such as Charcot-Marie-Tooth (CMT) Recent or ongoing radiation therapy An unusual or allergic reaction to vincristine, other chemotherapy agents, other medications, foods, dyes, or preservatives Pregnant or trying to get pregnant Breast-feeding How should I use this medication? This medication is infused into a vein. It is given by your care team in a hospital or clinic setting. Talk to your care team about the use of this medication in children. While it may be given to children for selected conditions, precautions do apply. Overdosage: If you think you have taken too much of this medicine contact a poison control center or emergency room at once. NOTE: This medicine  is only for you. Do not share this medicine with others. What if I miss a dose? Keep appointments for follow-up doses. It is important not to miss your dose. Call your care team if you are unable to keep an appointment. What may interact with this medication? Do not take this medication with any of the following: Live virus vaccines This medication may also interact with the following: Medications for fungal infections, such as itraconazole or fluconazole Phenytoin Supplements, such as St. John's wort This list may not describe all possible interactions. Give your health care provider a list of all the medicines, herbs, non-prescription drugs, or dietary supplements you use. Also tell them if you smoke, drink alcohol, or use illegal drugs. Some items may interact with your medicine. What should I watch for while using this medication? Your condition will be monitored carefully while you are receiving this medication. This medication may make you feel generally unwell. This is not uncommon as chemotherapy can affect healthy cells as well as cancer cells. Report any side effects. Continue  your course of treatment even though you feel ill unless your care team tells you to stop. You may need blood work while taking this medication. This medication may increase your risk to bruise or bleed. Call your care team if you notice any unusual bleeding. This medication may increase your risk of getting an infection. Call your care team for advice if you get a fever, chills, sore throat, or other symptoms of a cold or flu. Do not treat yourself. Try to avoid being around people who are sick. This medication will cause constipation. If you do not have a bowel movement for 3 days, call your care team. Call your care team if you are around anyone with measles, chickenpox, or if you develop sores or blisters that do not heal properly. Be careful brushing or flossing your teeth or using a toothpick because you may  get an infection or bleed more easily. If you have any dental work done, tell your dentist you are receiving this medication Talk to your care team if you or your partner wish to become pregnant or think either of you might be pregnant. This medication can cause serious birth defects. This medication may cause infertility. Talk to your care team if you are concerned about your fertility. Talk to your care team before breastfeeding. Changes to your treatment plan may be needed. What side effects may I notice from receiving this medication? Side effects that you should report to your care team as soon as possible: Allergic reactions--skin rash, itching, hives, swelling of the face, lips, tongue, or throat High uric acid level--severe pain, redness, warmth, or swelling in joints, pain or trouble passing urine, pain in the lower back or sides Infection--fever, chills, cough, sore throat, wounds that don't heal, pain or trouble when passing urine, general feeling of discomfort or being unwell Pain, tingling, or numbness in the hands or feet, muscle weakness, change in vision, confusion or trouble speaking, loss of balance or coordination, trouble walking, seizures Painful swelling, warmth, or redness of the skin, blisters or sores at the infusion site Shortness of breath or trouble breathing Side effects that usually do not require medical attention (report to your care team if they continue or are bothersome): Constipation Diarrhea Hair loss Loss of appetite Nausea Stomach cramping Vomiting This list may not describe all possible side effects. Call your doctor for medical advice about side effects. You may report side effects to FDA at 1-800-FDA-1088. Where should I keep my medication? This medication is given in a hospital or clinic. It will not be stored at home. NOTE: This sheet is a summary. It may not cover all possible information. If you have questions about this medicine, talk to your  doctor, pharmacist, or health care provider.  2024 Elsevier/Gold Standard (2021-06-12 00:00:00) Cyclophosphamide Injection What is this medication? CYCLOPHOSPHAMIDE (sye kloe FOSS fa mide) treats some types of cancer. It works by slowing down the growth of cancer cells. This medicine may be used for other purposes; ask your health care provider or pharmacist if you have questions. COMMON BRAND NAME(S): Cyclophosphamide, Cytoxan, Neosar What should I tell my care team before I take this medication? They need to know if you have any of these conditions: Heart disease Irregular heartbeat or rhythm Infection Kidney problems Liver disease Low blood cell levels (white cells, platelets, or red blood cells) Lung disease Previous radiation Trouble passing urine An unusual or allergic reaction to cyclophosphamide, other medications, foods, dyes, or preservatives Pregnant or trying to get  pregnant Breast-feeding How should I use this medication? This medication is injected into a vein. It is given by your care team in a hospital or clinic setting. Talk to your care team about the use of this medication in children. Special care may be needed. Overdosage: If you think you have taken too much of this medicine contact a poison control center or emergency room at once. NOTE: This medicine is only for you. Do not share this medicine with others. What if I miss a dose? Keep appointments for follow-up doses. It is important not to miss your dose. Call your care team if you are unable to keep an appointment. What may interact with this medication? Amphotericin B Amiodarone Azathioprine Certain antivirals for HIV or hepatitis Certain medications for blood pressure, such as enalapril, lisinopril, quinapril Cyclosporine Diuretics Etanercept Indomethacin Medications that relax muscles Metronidazole Natalizumab Tamoxifen Warfarin This list may not describe all possible interactions. Give your  health care provider a list of all the medicines, herbs, non-prescription drugs, or dietary supplements you use. Also tell them if you smoke, drink alcohol, or use illegal drugs. Some items may interact with your medicine. What should I watch for while using this medication? This medication may make you feel generally unwell. This is not uncommon as chemotherapy can affect healthy cells as well as cancer cells. Report any side effects. Continue your course of treatment even though you feel ill unless your care team tells you to stop. You may need blood work while you are taking this medication. This medication may increase your risk of getting an infection. Call your care team for advice if you get a fever, chills, sore throat, or other symptoms of a cold or flu. Do not treat yourself. Try to avoid being around people who are sick. Avoid taking medications that contain aspirin, acetaminophen, ibuprofen, naproxen, or ketoprofen unless instructed by your care team. These medications may hide a fever. Be careful brushing or flossing your teeth or using a toothpick because you may get an infection or bleed more easily. If you have any dental work done, tell your dentist you are receiving this medication. Drink water or other fluids as directed. Urinate often, even at night. Some products may contain alcohol. Ask your care team if this medication contains alcohol. Be sure to tell all care teams you are taking this medicine. Certain medicines, like metronidazole and disulfiram, can cause an unpleasant reaction when taken with alcohol. The reaction includes flushing, headache, nausea, vomiting, sweating, and increased thirst. The reaction can last from 30 minutes to several hours. Talk to your care team if you wish to become pregnant or think you might be pregnant. This medication can cause serious birth defects if taken during pregnancy and for 1 year after the last dose. A negative pregnancy test is required  before starting this medication. A reliable form of contraception is recommended while taking this medication and for 1 year after the last dose. Talk to your care team about reliable forms of contraception. Do not father a child while taking this medication and for 4 months after the last dose. Use a condom during this time period. Do not breast-feed while taking this medication or for 1 week after the last dose. This medication may cause infertility. Talk to your care team if you are concerned about your fertility. Talk to your care team about your risk of cancer. You may be more at risk for certain types of cancer if you take this medication. What  side effects may I notice from receiving this medication? Side effects that you should report to your care team as soon as possible: Allergic reactions--skin rash, itching, hives, swelling of the face, lips, tongue, or throat Dry cough, shortness of breath or trouble breathing Heart failure--shortness of breath, swelling of the ankles, feet, or hands, sudden weight gain, unusual weakness or fatigue Heart muscle inflammation--unusual weakness or fatigue, shortness of breath, chest pain, fast or irregular heartbeat, dizziness, swelling of the ankles, feet, or hands Heart rhythm changes--fast or irregular heartbeat, dizziness, feeling faint or lightheaded, chest pain, trouble breathing Infection--fever, chills, cough, sore throat, wounds that don't heal, pain or trouble when passing urine, general feeling of discomfort or being unwell Kidney injury--decrease in the amount of urine, swelling of the ankles, hands, or feet Liver injury--right upper belly pain, loss of appetite, nausea, light-colored stool, dark yellow or brown urine, yellowing skin or eyes, unusual weakness or fatigue Low red blood cell level--unusual weakness or fatigue, dizziness, headache, trouble breathing Low sodium level--muscle weakness, fatigue, dizziness, headache, confusion Red or  dark brown urine Unusual bruising or bleeding Side effects that usually do not require medical attention (report to your care team if they continue or are bothersome): Hair loss Irregular menstrual cycles or spotting Loss of appetite Nausea Pain, redness, or swelling with sores inside the mouth or throat Vomiting This list may not describe all possible side effects. Call your doctor for medical advice about side effects. You may report side effects to FDA at 1-800-FDA-1088. Where should I keep my medication? This medication is given in a hospital or clinic. It will not be stored at home. NOTE: This sheet is a summary. It may not cover all possible information. If you have questions about this medicine, talk to your doctor, pharmacist, or health care provider.  2024 Elsevier/Gold Standard (2021-08-03 00:00:00)

## 2023-05-26 NOTE — Progress Notes (Signed)
 68 year old male diagnosed with non-Hodgkin's lymphoma and followed by Dr. Leonides Schanz.  Patient will receive CHOP. He was admitted on Jan 9 with abdominal pain, small bowel mass and perforation. He was sent home with a wound vac.  Past medical history includes depression, GERD, hypercholesterolemia, prediabetes, and sleep apnea.  Medications include Zofran, prednisone, Compazine, and Protonix.  Labs include: Glucose 239, BUN 25.  Height: 6 foot 1 inch. Weight: 227 pounds 4 ounces. Usual body weight: Patient weighed 247 pounds in January 2025. BMI: 29.98. ECOG: 1.  Patient is status post small bowel perforation with resection.  He has a slow healing wound and is wearing a wound vac.He is positive for 8% weight loss in less than 3 months primarily due to surgery and healthier diet prior to small bowel perforation. He tries to eat a lot of protein. He drinks [employer] Nutrition Plan shakes 1-2 times daily providing 150 calories and 30 grams protein each. Describes slight nausea after eating but no vomiting.  Nutrition diagnosis: Unintended weight loss related to lymphoma as evidenced by 20 pound weight loss/8% in less than 3 months.  Intervention: Educated on importance of smaller more frequent meals and snacks with adequate calories and protein. Reviewed high-protein foods. Recommended Juven 2 times daily and provided coupons and purchasing information. An alternate option: MVI + 500 mg Vitamin C BID + 220 mg Zinc Sulfate daily. Discontinue Zinc after 2 weeks to prevent copper deficiency.  Provided handouts and coupons along with contact information.  Monitoring, evaluation, goals: Patient will consume adequate calories and protein to minimize further weight loss and improve healing.  Next visit: Tuesday, March 18 during infusion.  **Disclaimer: This note was dictated with voice recognition software. Similar sounding words can inadvertently be transcribed and this note may contain  transcription errors which may not have been corrected upon publication of note.**

## 2023-05-27 ENCOUNTER — Ambulatory Visit: Payer: Medicare Other | Attending: Rheumatology | Admitting: Rheumatology

## 2023-05-27 ENCOUNTER — Other Ambulatory Visit: Payer: Self-pay

## 2023-05-27 ENCOUNTER — Other Ambulatory Visit (HOSPITAL_COMMUNITY): Payer: Self-pay

## 2023-05-27 ENCOUNTER — Encounter: Payer: Self-pay | Admitting: Rheumatology

## 2023-05-27 VITALS — BP 127/80 | HR 85 | Resp 16 | Ht 73.0 in | Wt 231.0 lb

## 2023-05-27 DIAGNOSIS — J45990 Exercise induced bronchospasm: Secondary | ICD-10-CM

## 2023-05-27 DIAGNOSIS — I7300 Raynaud's syndrome without gangrene: Secondary | ICD-10-CM

## 2023-05-27 DIAGNOSIS — G252 Other specified forms of tremor: Secondary | ICD-10-CM

## 2023-05-27 DIAGNOSIS — M25512 Pain in left shoulder: Secondary | ICD-10-CM | POA: Insufficient documentation

## 2023-05-27 DIAGNOSIS — G8929 Other chronic pain: Secondary | ICD-10-CM

## 2023-05-27 DIAGNOSIS — K219 Gastro-esophageal reflux disease without esophagitis: Secondary | ICD-10-CM

## 2023-05-27 DIAGNOSIS — R7989 Other specified abnormal findings of blood chemistry: Secondary | ICD-10-CM | POA: Diagnosis present

## 2023-05-27 DIAGNOSIS — R400 Somnolence: Secondary | ICD-10-CM | POA: Diagnosis present

## 2023-05-27 DIAGNOSIS — R700 Elevated erythrocyte sedimentation rate: Secondary | ICD-10-CM

## 2023-05-27 DIAGNOSIS — R7309 Other abnormal glucose: Secondary | ICD-10-CM

## 2023-05-27 DIAGNOSIS — E7800 Pure hypercholesterolemia, unspecified: Secondary | ICD-10-CM | POA: Diagnosis present

## 2023-05-27 DIAGNOSIS — N200 Calculus of kidney: Secondary | ICD-10-CM | POA: Diagnosis present

## 2023-05-27 DIAGNOSIS — K631 Perforation of intestine (nontraumatic): Secondary | ICD-10-CM | POA: Diagnosis present

## 2023-05-27 DIAGNOSIS — M353 Polymyalgia rheumatica: Secondary | ICD-10-CM

## 2023-05-27 DIAGNOSIS — G4733 Obstructive sleep apnea (adult) (pediatric): Secondary | ICD-10-CM | POA: Diagnosis present

## 2023-05-27 DIAGNOSIS — M25551 Pain in right hip: Secondary | ICD-10-CM | POA: Diagnosis present

## 2023-05-27 DIAGNOSIS — J3089 Other allergic rhinitis: Secondary | ICD-10-CM

## 2023-05-27 DIAGNOSIS — Z7952 Long term (current) use of systemic steroids: Secondary | ICD-10-CM | POA: Diagnosis not present

## 2023-05-27 DIAGNOSIS — M25552 Pain in left hip: Secondary | ICD-10-CM | POA: Diagnosis present

## 2023-05-27 DIAGNOSIS — C8590 Non-Hodgkin lymphoma, unspecified, unspecified site: Secondary | ICD-10-CM | POA: Diagnosis present

## 2023-05-27 DIAGNOSIS — F322 Major depressive disorder, single episode, severe without psychotic features: Secondary | ICD-10-CM | POA: Diagnosis present

## 2023-05-27 DIAGNOSIS — N400 Enlarged prostate without lower urinary tract symptoms: Secondary | ICD-10-CM | POA: Diagnosis present

## 2023-05-27 DIAGNOSIS — M25511 Pain in right shoulder: Secondary | ICD-10-CM | POA: Diagnosis present

## 2023-05-27 MED ORDER — PREDNISONE 1 MG PO TABS
ORAL_TABLET | ORAL | 0 refills | Status: DC
  Filled 2023-05-27: qty 120, 30d supply, fill #0

## 2023-05-27 NOTE — Patient Instructions (Signed)
 Prednisone 15 mg by mouth daily for 2 weeks Prednisone 12.5 mg daily for 2 weeks Prednisone 10 mg daily for 4 weeks and then taper by 1 mg every month

## 2023-05-28 ENCOUNTER — Other Ambulatory Visit: Payer: Self-pay | Admitting: Hematology and Oncology

## 2023-05-28 ENCOUNTER — Inpatient Hospital Stay: Payer: Medicare Other

## 2023-05-28 DIAGNOSIS — Z95828 Presence of other vascular implants and grafts: Secondary | ICD-10-CM

## 2023-05-28 MED ORDER — PEGFILGRASTIM INJECTION 6 MG/0.6ML ~~LOC~~
6.0000 mg | PREFILLED_SYRINGE | Freq: Once | SUBCUTANEOUS | Status: DC
Start: 2023-05-28 — End: 2023-05-28

## 2023-05-29 ENCOUNTER — Inpatient Hospital Stay: Payer: Medicare Other

## 2023-05-29 ENCOUNTER — Encounter: Payer: Self-pay | Admitting: Hematology and Oncology

## 2023-05-29 ENCOUNTER — Ambulatory Visit: Payer: Medicare Other

## 2023-05-29 VITALS — BP 151/82 | HR 79 | Temp 98.0°F | Resp 17

## 2023-05-29 DIAGNOSIS — Z95828 Presence of other vascular implants and grafts: Secondary | ICD-10-CM

## 2023-05-29 DIAGNOSIS — Z5111 Encounter for antineoplastic chemotherapy: Secondary | ICD-10-CM | POA: Diagnosis not present

## 2023-05-29 MED ORDER — PEGFILGRASTIM INJECTION 6 MG/0.6ML ~~LOC~~
6.0000 mg | PREFILLED_SYRINGE | Freq: Once | SUBCUTANEOUS | Status: AC
  Administered 2023-05-29: 6 mg via SUBCUTANEOUS
  Filled 2023-05-29: qty 0.6

## 2023-05-29 NOTE — Patient Instructions (Signed)

## 2023-06-02 ENCOUNTER — Telehealth: Payer: Self-pay | Admitting: *Deleted

## 2023-06-02 ENCOUNTER — Other Ambulatory Visit: Payer: Self-pay | Admitting: *Deleted

## 2023-06-02 DIAGNOSIS — Z95828 Presence of other vascular implants and grafts: Secondary | ICD-10-CM

## 2023-06-02 DIAGNOSIS — C8449 Peripheral T-cell lymphoma, not classified, extranodal and solid organ sites: Secondary | ICD-10-CM

## 2023-06-02 NOTE — Telephone Encounter (Signed)
 Received call from [facility] RN  She states she saw pt today and he has c/o dizziness, some blurred vision, a bit darker urine.  VSS 158/72 sitting, 140/78 standing. Advised that I would call pt and appreciated her call to [place]. TCT patient. Spoke with him.  He had his 1st treatment on 05/26/23. Overall, he tolerated it well. Occasional mild nausea-resolves with zofran or compazine. He does confirm the dizziness, occ blurred vision, a bit darker urine, decreased appetite-mostly because food does not taste the same right now. Also admits to right and left flank pain (which is new). Asked pt if he would be agreeable to coming in tomorrow for labs, u/a and culture tomorrow and be seen in [facility]. He is agreeable to this. Appts have been made lab orders are in. He is aware of his appt time tomorrow.

## 2023-06-03 ENCOUNTER — Inpatient Hospital Stay

## 2023-06-03 ENCOUNTER — Inpatient Hospital Stay (HOSPITAL_COMMUNITY)

## 2023-06-03 ENCOUNTER — Inpatient Hospital Stay (HOSPITAL_COMMUNITY)
Admission: AD | Admit: 2023-06-03 | Discharge: 2023-06-07 | DRG: 871 | Disposition: A | Discharge status: Home Health | Source: Ambulatory Visit | Attending: Family Medicine | Admitting: Family Medicine

## 2023-06-03 ENCOUNTER — Encounter (HOSPITAL_COMMUNITY): Payer: Self-pay

## 2023-06-03 ENCOUNTER — Inpatient Hospital Stay: Attending: Physician Assistant | Admitting: Physician Assistant

## 2023-06-03 ENCOUNTER — Other Ambulatory Visit: Payer: Self-pay

## 2023-06-03 ENCOUNTER — Encounter: Admitting: Physician Assistant

## 2023-06-03 ENCOUNTER — Other Ambulatory Visit

## 2023-06-03 VITALS — BP 122/58 | HR 111 | Temp 101.4°F | Resp 18 | Ht 73.0 in | Wt 227.0 lb

## 2023-06-03 VITALS — BP 118/65 | HR 100 | Temp 100.8°F | Resp 16

## 2023-06-03 DIAGNOSIS — R509 Fever, unspecified: Secondary | ICD-10-CM

## 2023-06-03 DIAGNOSIS — M353 Polymyalgia rheumatica: Secondary | ICD-10-CM | POA: Diagnosis present

## 2023-06-03 DIAGNOSIS — Z95828 Presence of other vascular implants and grafts: Secondary | ICD-10-CM

## 2023-06-03 DIAGNOSIS — Z7952 Long term (current) use of systemic steroids: Secondary | ICD-10-CM | POA: Diagnosis not present

## 2023-06-03 DIAGNOSIS — Z79899 Other long term (current) drug therapy: Secondary | ICD-10-CM

## 2023-06-03 DIAGNOSIS — D630 Anemia in neoplastic disease: Secondary | ICD-10-CM | POA: Diagnosis present

## 2023-06-03 DIAGNOSIS — E099 Drug or chemical induced diabetes mellitus without complications: Secondary | ICD-10-CM

## 2023-06-03 DIAGNOSIS — K651 Peritoneal abscess: Secondary | ICD-10-CM | POA: Diagnosis present

## 2023-06-03 DIAGNOSIS — G4733 Obstructive sleep apnea (adult) (pediatric): Secondary | ICD-10-CM | POA: Diagnosis present

## 2023-06-03 DIAGNOSIS — K219 Gastro-esophageal reflux disease without esophagitis: Secondary | ICD-10-CM | POA: Diagnosis present

## 2023-06-03 DIAGNOSIS — N400 Enlarged prostate without lower urinary tract symptoms: Secondary | ICD-10-CM | POA: Diagnosis present

## 2023-06-03 DIAGNOSIS — T380X5A Adverse effect of glucocorticoids and synthetic analogues, initial encounter: Secondary | ICD-10-CM | POA: Diagnosis present

## 2023-06-03 DIAGNOSIS — I96 Gangrene, not elsewhere classified: Secondary | ICD-10-CM | POA: Diagnosis present

## 2023-06-03 DIAGNOSIS — N179 Acute kidney failure, unspecified: Secondary | ICD-10-CM

## 2023-06-03 DIAGNOSIS — F322 Major depressive disorder, single episode, severe without psychotic features: Secondary | ICD-10-CM | POA: Diagnosis present

## 2023-06-03 DIAGNOSIS — E0965 Drug or chemical induced diabetes mellitus with hyperglycemia: Secondary | ICD-10-CM | POA: Diagnosis present

## 2023-06-03 DIAGNOSIS — E785 Hyperlipidemia, unspecified: Secondary | ICD-10-CM | POA: Diagnosis present

## 2023-06-03 DIAGNOSIS — D709 Neutropenia, unspecified: Secondary | ICD-10-CM | POA: Diagnosis present

## 2023-06-03 DIAGNOSIS — C862 Enteropathy-type (intestinal) T-cell lymphoma not having achieved remission: Secondary | ICD-10-CM | POA: Diagnosis not present

## 2023-06-03 DIAGNOSIS — A419 Sepsis, unspecified organism: Principal | ICD-10-CM | POA: Diagnosis present

## 2023-06-03 DIAGNOSIS — R5081 Fever presenting with conditions classified elsewhere: Secondary | ICD-10-CM

## 2023-06-03 DIAGNOSIS — E876 Hypokalemia: Secondary | ICD-10-CM | POA: Diagnosis not present

## 2023-06-03 DIAGNOSIS — Z85828 Personal history of other malignant neoplasm of skin: Secondary | ICD-10-CM | POA: Diagnosis not present

## 2023-06-03 DIAGNOSIS — C8449 Peripheral T-cell lymphoma, not classified, extranodal and solid organ sites: Secondary | ICD-10-CM | POA: Diagnosis present

## 2023-06-03 DIAGNOSIS — F32A Depression, unspecified: Secondary | ICD-10-CM | POA: Diagnosis present

## 2023-06-03 DIAGNOSIS — Z5111 Encounter for antineoplastic chemotherapy: Secondary | ICD-10-CM | POA: Insufficient documentation

## 2023-06-03 DIAGNOSIS — Z888 Allergy status to other drugs, medicaments and biological substances status: Secondary | ICD-10-CM

## 2023-06-03 DIAGNOSIS — R6510 Systemic inflammatory response syndrome (SIRS) of non-infectious origin without acute organ dysfunction: Secondary | ICD-10-CM | POA: Diagnosis present

## 2023-06-03 LAB — URINALYSIS, COMPLETE (UACMP) WITH MICROSCOPIC
Bacteria, UA: NONE SEEN
Bilirubin Urine: NEGATIVE
Glucose, UA: NEGATIVE mg/dL
Hgb urine dipstick: NEGATIVE
Ketones, ur: NEGATIVE mg/dL
Leukocytes,Ua: NEGATIVE
Nitrite: NEGATIVE
Protein, ur: NEGATIVE mg/dL
Specific Gravity, Urine: 1.025 (ref 1.005–1.030)
pH: 5 (ref 5.0–8.0)

## 2023-06-03 LAB — CBC WITH DIFFERENTIAL (CANCER CENTER ONLY)
Abs Immature Granulocytes: 0.86 10*3/uL — ABNORMAL HIGH (ref 0.00–0.07)
Basophils Absolute: 0.1 10*3/uL (ref 0.0–0.1)
Basophils Relative: 1 %
Eosinophils Absolute: 0.2 10*3/uL (ref 0.0–0.5)
Eosinophils Relative: 4 %
HCT: 36.6 % — ABNORMAL LOW (ref 39.0–52.0)
Hemoglobin: 11.8 g/dL — ABNORMAL LOW (ref 13.0–17.0)
Immature Granulocytes: 19 %
Lymphocytes Relative: 19 %
Lymphs Abs: 0.9 10*3/uL (ref 0.7–4.0)
MCH: 27.8 pg (ref 26.0–34.0)
MCHC: 32.2 g/dL (ref 30.0–36.0)
MCV: 86.3 fL (ref 80.0–100.0)
Monocytes Absolute: 1.1 10*3/uL — ABNORMAL HIGH (ref 0.1–1.0)
Monocytes Relative: 25 %
Neutro Abs: 1.5 10*3/uL — ABNORMAL LOW (ref 1.7–7.7)
Neutrophils Relative %: 32 %
Platelet Count: 250 10*3/uL (ref 150–400)
RBC: 4.24 MIL/uL (ref 4.22–5.81)
RDW: 14.6 % (ref 11.5–15.5)
WBC Count: 4.6 10*3/uL (ref 4.0–10.5)
nRBC: 0.7 % — ABNORMAL HIGH (ref 0.0–0.2)

## 2023-06-03 LAB — CMP (CANCER CENTER ONLY)
ALT: 22 U/L (ref 0–44)
AST: 13 U/L — ABNORMAL LOW (ref 15–41)
Albumin: 3.7 g/dL (ref 3.5–5.0)
Alkaline Phosphatase: 66 U/L (ref 38–126)
Anion gap: 11 (ref 5–15)
BUN: 23 mg/dL (ref 8–23)
CO2: 25 mmol/L (ref 22–32)
Calcium: 9.3 mg/dL (ref 8.9–10.3)
Chloride: 104 mmol/L (ref 98–111)
Creatinine: 1.27 mg/dL — ABNORMAL HIGH (ref 0.61–1.24)
GFR, Estimated: >60 mL/min (ref >60)
Glucose, Bld: 169 mg/dL — ABNORMAL HIGH (ref 70–99)
Potassium: 3.7 mmol/L (ref 3.5–5.1)
Sodium: 140 mmol/L (ref 135–145)
Total Bilirubin: 0.5 mg/dL (ref 0.0–1.2)
Total Protein: 6.8 g/dL (ref 6.5–8.1)

## 2023-06-03 LAB — RESP PANEL BY RT-PCR (RSV, FLU A&B, COVID)  RVPGX2
Influenza A by PCR: NEGATIVE
Influenza B by PCR: NEGATIVE
Resp Syncytial Virus by PCR: NEGATIVE
SARS Coronavirus 2 by RT PCR: NEGATIVE

## 2023-06-03 LAB — SAMPLE TO BLOOD BANK

## 2023-06-03 LAB — GLUCOSE, CAPILLARY: Glucose-Capillary: 142 mg/dL — ABNORMAL HIGH (ref 70–99)

## 2023-06-03 LAB — LACTIC ACID, PLASMA
Lactic Acid, Venous: 3 mmol/L (ref 0.5–1.9)
Lactic Acid, Venous: 2.1 mmol/L (ref 0.5–1.9)

## 2023-06-03 MED ORDER — ALLOPURINOL 300 MG PO TABS
300.0000 mg | ORAL_TABLET | Freq: Every day | ORAL | Status: DC
  Administered 2023-06-03 – 2023-06-07 (×5): 300 mg via ORAL
  Filled 2023-06-03 (×5): qty 1

## 2023-06-03 MED ORDER — PANTOPRAZOLE SODIUM 40 MG PO TBEC
40.0000 mg | DELAYED_RELEASE_TABLET | Freq: Every day | ORAL | Status: DC
  Administered 2023-06-04 – 2023-06-07 (×4): 40 mg via ORAL
  Filled 2023-06-03 (×4): qty 1

## 2023-06-03 MED ORDER — FAMOTIDINE IN NACL 20-0.9 MG/50ML-% IV SOLN
20.0000 mg | Freq: Once | INTRAVENOUS | Status: AC
  Administered 2023-06-03: 20 mg via INTRAVENOUS
  Filled 2023-06-03: qty 50

## 2023-06-03 MED ORDER — ACETAMINOPHEN 325 MG PO TABS
650.0000 mg | ORAL_TABLET | Freq: Once | ORAL | Status: AC
  Administered 2023-06-03: 650 mg via ORAL
  Filled 2023-06-03: qty 2

## 2023-06-03 MED ORDER — PROCHLORPERAZINE MALEATE 10 MG PO TABS: 10.0000 mg | ORAL_TABLET | Freq: Four times a day (QID) | ORAL | Status: DC | PRN

## 2023-06-03 MED ORDER — ALFUZOSIN HCL ER 10 MG PO TB24
10.0000 mg | ORAL_TABLET | Freq: Every day | ORAL | Status: DC
  Administered 2023-06-03 – 2023-06-07 (×5): 10 mg via ORAL
  Filled 2023-06-03 (×5): qty 1

## 2023-06-03 MED ORDER — ACETAMINOPHEN 650 MG RE SUPP: 650.0000 mg | Freq: Four times a day (QID) | RECTAL | Status: DC | PRN

## 2023-06-03 MED ORDER — ONDANSETRON HCL 4 MG/2ML IJ SOLN
8.0000 mg | Freq: Once | INTRAMUSCULAR | Status: AC
  Administered 2023-06-03: 8 mg via INTRAVENOUS
  Filled 2023-06-03: qty 4

## 2023-06-03 MED ORDER — VANCOMYCIN HCL 1500 MG/300ML IV SOLN
1500.0000 mg | Freq: Every day | INTRAVENOUS | Status: DC
  Administered 2023-06-03: 1500 mg via INTRAVENOUS
  Filled 2023-06-03 (×2): qty 300

## 2023-06-03 MED ORDER — SODIUM CHLORIDE 0.9 % IV BOLUS
500.0000 mL | Freq: Once | INTRAVENOUS | Status: AC
  Administered 2023-06-03: 500 mL via INTRAVENOUS

## 2023-06-03 MED ORDER — INSULIN ASPART 100 UNIT/ML IJ SOLN
0.0000 [IU] | Freq: Three times a day (TID) | INTRAMUSCULAR | Status: DC
  Administered 2023-06-04 (×2): 1 [IU] via SUBCUTANEOUS
  Administered 2023-06-04 – 2023-06-05 (×2): 2 [IU] via SUBCUTANEOUS
  Administered 2023-06-05 (×2): 1 [IU] via SUBCUTANEOUS
  Administered 2023-06-06 (×3): 2 [IU] via SUBCUTANEOUS
  Administered 2023-06-07: 1 [IU] via SUBCUTANEOUS

## 2023-06-03 MED ORDER — ALBUTEROL SULFATE (2.5 MG/3ML) 0.083% IN NEBU: 3.0000 mL | INHALATION_SOLUTION | Freq: Four times a day (QID) | RESPIRATORY_TRACT | Status: DC | PRN

## 2023-06-03 MED ORDER — PREDNISONE 5 MG PO TABS
15.0000 mg | ORAL_TABLET | Freq: Every day | ORAL | Status: DC
  Administered 2023-06-04 – 2023-06-07 (×4): 15 mg via ORAL
  Filled 2023-06-03 (×4): qty 3

## 2023-06-03 MED ORDER — ONDANSETRON HCL 4 MG/2ML IJ SOLN
4.0000 mg | Freq: Four times a day (QID) | INTRAMUSCULAR | Status: DC | PRN
  Administered 2023-06-04: 4 mg via INTRAVENOUS
  Filled 2023-06-03: qty 2

## 2023-06-03 MED ORDER — OXYCODONE HCL 5 MG PO TABS
5.0000 mg | ORAL_TABLET | Freq: Four times a day (QID) | ORAL | Status: DC | PRN
  Administered 2023-06-03 – 2023-06-05 (×5): 5 mg via ORAL
  Administered 2023-06-06 – 2023-06-07 (×4): 10 mg via ORAL
  Filled 2023-06-03: qty 2
  Filled 2023-06-03: qty 1
  Filled 2023-06-03: qty 2
  Filled 2023-06-03 (×2): qty 1
  Filled 2023-06-03 (×2): qty 2
  Filled 2023-06-03 (×2): qty 1

## 2023-06-03 MED ORDER — ACETAMINOPHEN 325 MG PO TABS
650.0000 mg | ORAL_TABLET | Freq: Four times a day (QID) | ORAL | Status: DC | PRN
  Administered 2023-06-03: 650 mg via ORAL
  Filled 2023-06-03: qty 2

## 2023-06-03 MED ORDER — IOHEXOL 300 MG/ML  SOLN
100.0000 mL | Freq: Once | INTRAMUSCULAR | Status: AC | PRN
  Administered 2023-06-03: 100 mL via INTRAVENOUS

## 2023-06-03 MED ORDER — FINASTERIDE 5 MG PO TABS
5.0000 mg | ORAL_TABLET | Freq: Every day | ORAL | Status: DC
  Administered 2023-06-03 – 2023-06-07 (×5): 5 mg via ORAL
  Filled 2023-06-03 (×5): qty 1

## 2023-06-03 MED ORDER — SODIUM CHLORIDE 0.9 % IV SOLN: INTRAVENOUS | Status: AC

## 2023-06-03 MED ORDER — SODIUM CHLORIDE 0.9 % IV SOLN
2.0000 g | Freq: Once | INTRAVENOUS | Status: AC
  Administered 2023-06-03: 2 g via INTRAVENOUS
  Filled 2023-06-03: qty 12.5

## 2023-06-03 MED ORDER — SODIUM CHLORIDE 0.9 % IV SOLN
2.0000 g | Freq: Three times a day (TID) | INTRAVENOUS | Status: DC
  Administered 2023-06-03 – 2023-06-05 (×5): 2 g via INTRAVENOUS
  Filled 2023-06-03 (×5): qty 12.5

## 2023-06-03 MED ORDER — ONDANSETRON HCL 4 MG PO TABS
4.0000 mg | ORAL_TABLET | Freq: Four times a day (QID) | ORAL | Status: DC | PRN
  Filled 2023-06-03: qty 1

## 2023-06-03 MED ORDER — SODIUM CHLORIDE 0.9 % IV SOLN: Freq: Once | INTRAVENOUS | Status: AC

## 2023-06-03 MED ORDER — METHOCARBAMOL 500 MG PO TABS
1000.0000 mg | ORAL_TABLET | Freq: Three times a day (TID) | ORAL | Status: DC | PRN
  Administered 2023-06-05 – 2023-06-06 (×2): 1000 mg via ORAL
  Filled 2023-06-03 (×2): qty 2

## 2023-06-03 NOTE — Assessment & Plan Note (Signed)
 Cpap nightly, does not want while inpatient

## 2023-06-03 NOTE — Progress Notes (Signed)
 Symptom Management Consult Note [facility]    Patient Care Team: Tally Joe, MD as PCP - General (Family Medicine) Lyn Records, MD (Inactive) as PCP - Cardiology (Cardiology)    Name / MRN / DOB: Elijah [name]  [id number]  [birth year]   Date of visit: 06/03/2023   Chief Complaint/Reason for visit: dizziness, decreased appetite    Current Therapy: CHOP with Neulasta  Last treatment:  Day 1   Cycle 1 on 05/26/23 with G-CSF on 05/29/23   ASSESSMENT & PLAN: Patient is a 68 y.o. 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 Leonides Schanz.  I have viewed most recent oncology note and lab work.    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 Patient is ill appearing although non toxic. Clear lung exam. No abdominal or CVA tenderness. Abdominal wound vac in place without overt infection. - Blood cultures collected. 1 L NS, IV zofran and pepcid, and tylenol administered in clinic. IV cefepime administered after cultures were collected. We do not have vancomycin in clinic.  - CBC with WBC 4.6 and ANC 1.5, stable anemia. CMP sowing elevated creatinine to 1.27, likely dehydration as he had had poor fluid intake. UA is negative for infection, urine culture in process. Covid/flu/RSV swab still in process. - Recheck vitals after IVF and BP improved 122/58. - Medical oncology team notified of admission. Spoke with Dr. Lafe Garin with hospitalist service who agrees to assume care of patient and bring into the hospital for further evaluation and management.     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   Elijah [name] is a 68 y.o. male with oncologic history as above presenting to [facility] today with chief complaint of dizziness and decreased appetite. Patient is accompanied by spouse who provides additional history.  Patient had first chemotherapy x 8 days ago. He was feeling unwell afterwards however symptoms worsened x 2 days ago. Patient reports feeling dizzy when changing positions and persistent nausea despite taking compazine and zofran. Last doses were compazine at 9a and zofran yesterday morning. He has minimal PO intake secondary to persistent nausea. He mentioned symptoms to home health nurse yesterday who encouraged him to call the Cancer Center. His abdominal dressing was changed yesterday and spouse admits being pleased with the healing process. He has not had purulent drainage, bleeding, or pain out of proportion.   He reports bilateral flank pain x 2 days as well. He describes it as cramping and sometimes radiating to the front, mostly on the left side. Pain is not consistent with MSK strain, describes it as deeper. He did have long bone pain after his Neulasta injection that was manageable with Claritin. This pain is different. No urinary burning or discharge but reports decreased urine output and a sensation of incomplete emptying. No blood in urine. No history of UTI. He has been monitoring temperature frequently and has been  afebrile although has been taking tylenol. Admits to chills and cold sweats last night. No sick contacts.  ROS  All other systems are reviewed and are negative for acute change except as noted in the HPI.    Allergies  Allergen Reactions   Singulair [Montelukast] Hives   Amlodipine     agitation   Crestor [Rosuvastatin]  Other (See Comments)    Muscle Aches      Past Medical History:  Diagnosis Date   Acid reflux    Allergic rhinitis    Asthma    Bloating    BPH (benign prostatic hyperplasia)    Depression    ED (erectile dysfunction)    Epigastric pain    Exercise-induced asthma    GERD (gastroesophageal reflux disease)    History of kidney stones    Hypercholesteremia    Migraine 2009   Polymyalgia rheumatica (HCC)    Prediabetes    started on metformin during january hospitalization   Prostate disease    Sleep apnea      Past Surgical History:  Procedure Laterality Date   ANKLE SURGERY Right    BOWEL RESECTION N/A 04/10/2023   Procedure: SMALL BOWEL RESECTION WITH MASS;  Surgeon: Moise Boring, MD;  Location: [facility] OR;  Service: General;  Laterality: N/A;   CHEST TUBE INSERTION Left    COLONOSCOPY     CYSTOSCOPY WITH RETROGRADE PYELOGRAM, URETEROSCOPY AND STENT PLACEMENT Left 06/29/2020   Procedure: CYSTOSCOPY WITH RETROGRADE PYELOGRAM, URETEROSCOPY , BASKET EXTRACTION, AND STENT PLACEMENT;  Surgeon: Sebastian Ache, MD;  Location: [facility] ORS;  Service: Urology;  Laterality: Left;  1 HR   IR IMAGING GUIDED PORT INSERTION  05/13/2023   KNEE SURGERY Right    LAPAROTOMY N/A 04/10/2023   Procedure: EXPLORATORY LAPAROTOMY;  Surgeon: Moise Boring, MD;  Location[facility] OR;  Service: General;  Laterality: N/A;   SKIN CANCER EXCISION     unsure   TONSILLECTOMY     VASECTOMY     WISDOM TOOTH EXTRACTION      Social History   Socioeconomic History   Marital status: Married    Spouse name: Not on file   Number of children: Not on file   Years of education: Not on file   Highest education level: Not on file  Occupational History   Not on file  Tobacco Use   Smoking status: Never    Passive exposure: Never   Smokeless tobacco: Never  Vaping Use   Vaping status: Never Used  Substance and Sexual Activity   Alcohol use: Yes    Comment: social   Drug use: Never   Sexual activity:  Not on file  Other Topics Concern   Not on file  Social History Narrative   Not on file   Social Drivers of Health   Financial Resource Strain: Not on file  Food Insecurity: No Food Insecurity (04/10/2023)   Hunger Vital Sign    Worried About Running Out of Food in the Last Year: Never true    Ran Out of Food in the Last Year: Never true  Transportation Needs: No Transportation Needs (04/10/2023)   PRAPARE - Administrator, Civil Service (Medical): No    Lack of Transportation (Non-Medical): No  Physical Activity: Not on file  Stress: Not on file  Social Connections: Not on file  Intimate Partner Violence: Not At Risk (04/10/2023)   Humiliation, Afraid, Rape, and Kick questionnaire    Fear of Current or Ex-Partner: No  Emotionally Abused: No    Physically Abused: No    Sexually Abused: No    Family History  Problem Relation Age of Onset   Healthy Son    Healthy Daughter      Current Outpatient Medications:    Accu-Chek Softclix Lancets lancets, Use daily before breakfast., Disp: 100 each, Rfl: 0   acetaminophen (TYLENOL) 325 MG tablet, Take 2 tablets (650 mg total) by mouth every 6 (six) hours as needed., Disp: , Rfl:    albuterol (VENTOLIN HFA) 108 (90 Base) MCG/ACT inhaler, Inhale 2 puffs into the lungs as needed (max of 12 puffs per day), Disp: 6.7 g, Rfl: 0   alfuzosin (UROXATRAL) 10 MG 24 hr tablet, Take 1 tablet (10 mg total) by mouth daily., Disp: 90 tablet, Rfl: 1   allopurinol (ZYLOPRIM) 300 MG tablet, Take 1 tablet (300 mg total) by mouth daily., Disp: 90 tablet, Rfl: 1   Blood Glucose Monitoring Suppl (ACCU-CHEK GUIDE) w/Device KIT, Use daily before breakfast., Disp: 1 kit, Rfl: 0   CINNAMON PO, Take 1 capsule by mouth daily., Disp: , Rfl:    finasteride (PROSCAR) 5 MG tablet, Take 1 tablet (5 mg total) by mouth daily., Disp: 90 tablet, Rfl: 3   glucose blood (ACCU-CHEK GUIDE TEST) test strip, Use daily before breakfast., Disp: 50 each, Rfl: 0   Glucose  Blood (BLOOD GLUCOSE TEST STRIPS) STRP, Use to check blood sugar before breakfast as directed, Disp: 100 strip, Rfl: 0   lidocaine-prilocaine (EMLA) cream, Apply 1 Application topically as needed., Disp: 30 g, Rfl: 0   methocarbamol (ROBAXIN) 500 MG tablet, Take 2 tablets (1,000 mg total) by mouth every 8 (eight) hours as needed for muscle spasms., Disp: 60 tablet, Rfl: 1   minoxidil (LONITEN) 2.5 MG tablet, Take 1 tablet (2.5 mg total) by mouth daily., Disp: 365 tablet, Rfl: 1   ondansetron (ZOFRAN) 8 MG tablet, Take 1 tablet (8 mg total) by mouth every 8 (eight) hours as needed., Disp: 30 tablet, Rfl: 0   oxyCODONE (OXY IR/ROXICODONE) 5 MG immediate release tablet, Take 1-2 tablets (5-10 mg total) by mouth every 6 (six) hours as needed for severe pain (pain score 7-10)., Disp: 180 tablet, Rfl: 0   pantoprazole (PROTONIX) 40 MG tablet, Take 1 tablet (40 mg total) by mouth daily 30 minutes to 1 hour before morning meal., Disp: 30 tablet, Rfl: 5   predniSONE (DELTASONE) 1 MG tablet, Take 4 tablets by mouth daily, along with a 5mg  tablet for a total of 9mg  daily for 1 month. Taper by 1mg  monthly., Disp: 120 tablet, Rfl: 0   predniSONE (DELTASONE) 20 MG tablet, Take 3 tablets (60 mg total) by mouth daily. Take on Day 1 of chemotherapy and continue for 5 full days. Restart with each cycle., Disp: 15 tablet, Rfl: 5   Probiotic TBEC, Take 1 capsule by mouth daily., Disp: , Rfl:    prochlorperazine (COMPAZINE) 10 MG tablet, Take 1 tablet (10 mg total) by mouth every 6 (six) hours as needed for nausea or vomiting., Disp: 30 tablet, Rfl: 0   sildenafil (REVATIO) 20 MG tablet, Take 2-5 tablets (40-100 mg total) by mouth as directed as needed for sexual funtion. (Patient not taking: Reported on 04/23/2023), Disp: 90 tablet, Rfl: 0   tadalafil, PAH, (ADCIRCA) 20 MG tablet, Take 1 tablet (20 mg total) by mouth every 3 (three) days as needed for sexual activity (Patient not taking: Reported on 04/23/2023), Disp: 20  tablet, Rfl: 5  PHYSICAL EXAM: ECOG  FS:1 - Symptomatic but completely ambulatory    Vitals:   06/03/23 1000 06/03/23 1309  BP: 97/64 (!) 122/58  Pulse: (!) 126 (!) 111  Resp: 18 18  Temp: (!) 100.4 F (38 C) (!) 101.4 F (38.6 C)  TempSrc: Oral Oral  SpO2: 97% 96%  Weight: 227 lb (103 kg)   Height: 6\' 1"  (1.854 m)    Physical Exam Vitals and nursing note reviewed.  Constitutional:      General: He is not in acute distress.    Appearance: He is ill-appearing. He is not toxic-appearing.  HENT:     Head: Normocephalic.     Mouth/Throat:     Mouth: Mucous membranes are dry.  Eyes:     Conjunctiva/sclera: Conjunctivae normal.  Cardiovascular:     Rate and Rhythm: Regular rhythm. Tachycardia present.     Pulses: Normal pulses.     Heart sounds: Normal heart sounds.  Pulmonary:     Effort: Pulmonary effort is normal.     Breath sounds: Normal breath sounds.  Chest:     Comments: Port-a-cath in right upper chest without signs of infection Abdominal:     General: There is no distension.     Tenderness: There is no right CVA tenderness or left CVA tenderness.     Comments: Wound vac with dressing. Dressing is clean/dry/intact. No surrounding erythema.  Abdomen is non tender  Musculoskeletal:     Cervical back: Normal range of motion.  Skin:    General: Skin is warm and dry.  Neurological:     Mental Status: He is alert.        LABORATORY DATA: I have reviewed the data as listed    Latest Ref Rng & Units 06/03/2023   10:50 AM 05/26/2023    1:46 PM 05/19/2023    9:49 AM  CBC  WBC 4.0 - 10.5 K/uL 4.6  11.0  8.5   Hemoglobin 13.0 - 17.0 g/dL 81.1  91.4  78.2   Hematocrit 39.0 - 52.0 % 36.6  38.0  37.4   Platelets 150 - 400 K/uL 250  300  321         Latest Ref Rng & Units 06/03/2023   10:50 AM 05/26/2023    1:46 PM 05/19/2023    9:49 AM  CMP  Glucose 70 - 99 mg/dL 956  213  086   BUN 8 - 23 mg/dL 23  25  20    Creatinine 0.61 - 1.24 mg/dL 5.78  4.69  6.29    Sodium 135 - 145 mmol/L 140  137  139   Potassium 3.5 - 5.1 mmol/L 3.7  4.2  3.8   Chloride 98 - 111 mmol/L 104  104  101   CO2 22 - 32 mmol/L 25  24  29    Calcium 8.9 - 10.3 mg/dL 9.3  9.7  9.8   Total Protein 6.5 - 8.1 g/dL 6.8  7.1  6.6   Total Bilirubin 0.0 - 1.2 mg/dL 0.5  0.3  0.4   Alkaline Phos 38 - 126 U/L 66  57  49   AST 15 - 41 U/L 13  14  14    ALT 0 - 44 U/L 22  16  20         RADIOGRAPHIC STUDIES (from last 24 hours if applicable) I have personally reviewed the radiological images as listed and agreed with the findings in the report. No results found.      Visit Diagnosis: 1. Neutropenic fever (  HCC)   2. 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   All questions were answered. The patient knows to call the clinic with any problems, questions or concerns. No barriers to learning was detected.  A total of more than 40 minutes were spent on this encounter with face-to-face time and non-face-to-face time, including preparing to see the patient, ordering tests and/or medications, counseling the patient and coordination of care as outlined above.    Thank you for allowing me to participate in the care of this patient.    Shanon Ace, PA-C Department of Hematology/Oncology [facility] at [facility] Phone: [phone]  [phone]    06/03/2023 2:23 PM

## 2023-06-03 NOTE — Assessment & Plan Note (Signed)
 Continue prednisone 15mg  daily (tapering down)

## 2023-06-03 NOTE — Assessment & Plan Note (Signed)
Continue protonix daily. 

## 2023-06-03 NOTE — Progress Notes (Signed)
 Patient direct admitted to 6E for neutropenic fever. Report given to 6E RN, Revonda Standard. PAC saline locked for transfer. Patient transferred via wheelchair with all belongings. Wife, Clydie Braun, aware and present at transfer.

## 2023-06-03 NOTE — H&P (Signed)
 History and Physical    Patient: Elijah [name] UEA:[id number] DOB: [birth year] DOA: 06/03/2023 DOS: the patient was seen and examined on 06/03/2023 PCP: Tally Joe, MD  Patient coming from:  direct admit  - lives with his wife. Ambulates independently    Chief Complaint: neutropenic fever   HPI: [name] is a 68 y.o. male with medical history significant of recent diagnosis of T cell lymphoma who was started on chemo last week, GERD, BPH, PMR, HLD, prediabetes, OSA who presented to cancer clinic today for dizziness and decreased appetite and was found to be febrile with tachycardia and soft bp. Blood cultures collected, given 1L NS IVF, IV zforan and tylneol. IV cefepime also administered after BC obtained. He was then directly admitted to [facility] for neutropenic fever.   In January he was admitted for perforated small bowel due to newly diagnosed lymphoma and had small bowel resection. He still has his wound vac in place.   He had his first chemo infusion on 05/26/2023. He had pegfilgrastim on 05/29/23.   He tells me in the last day he has had dry heaves and nausea. He has had rigors and sweats as well. No fever at home. He has been eating and drinking okay before this.  Denies any vision changes +headaches, chest pain or palpitations, some shortness of breath, mainly with exercise, abdominal pain, dysuria or leg swelling. He had scrotal pain yesterday, but this resolved. He has some pain around his port that is new. He also has had some congestion.   He does not smoke or drink alcohol.   ER Course:  vitals: 100.8, bp: 118/65, HR: 100, RR: 16 Pertinent labs: wbc: 4.6, ANC 1.5, creatinine: 1.27, urine culture and blood cultures obtained. Covid/flu/RSV negative Direct admitted to the floor.    Review of Systems: As mentioned in the history of present illness. All other systems reviewed and are negative. Past Medical History:  Diagnosis Date   Acid reflux    Allergic rhinitis    Asthma     Bloating    BPH (benign prostatic hyperplasia)    Depression    ED (erectile dysfunction)    Epigastric pain    Exercise-induced asthma    GERD (gastroesophageal reflux disease)    History of kidney stones    Hypercholesteremia    Migraine 2009   Polymyalgia rheumatica (HCC)    Prediabetes    started on metformin during january hospitalization   Prostate disease    Sleep apnea    Past Surgical History:  Procedure Laterality Date   ANKLE SURGERY Right    BOWEL RESECTION N/A 04/10/2023   Procedure: SMALL BOWEL RESECTION WITH MASS;  Surgeon: Moise Boring, MD;  Location: [facility] OR;  Service: General;  Laterality: N/A;   CHEST TUBE INSERTION Left    COLONOSCOPY     CYSTOSCOPY WITH RETROGRADE PYELOGRAM, URETEROSCOPY AND STENT PLACEMENT Left 06/29/2020   Procedure: CYSTOSCOPY WITH RETROGRADE PYELOGRAM, URETEROSCOPY , BASKET EXTRACTION, AND STENT PLACEMENT;  Surgeon: Sebastian Ache, MD;  Location: [facility] ORS;  Service: Urology;  Laterality: Left;  1 HR   IR IMAGING GUIDED PORT INSERTION  05/13/2023   KNEE SURGERY Right    LAPAROTOMY N/A 04/10/2023   Procedure: EXPLORATORY LAPAROTOMY;  Surgeon: Moise Boring, MD;  Location: [facility] OR;  Service: General;  Laterality: N/A;   SKIN CANCER EXCISION     unsure   TONSILLECTOMY     VASECTOMY     WISDOM TOOTH EXTRACTION  Social History:  reports that he has never smoked. He has never been exposed to tobacco smoke. He has never used smokeless tobacco. He reports current alcohol use. He reports that he does not use drugs.  Allergies  Allergen Reactions   Singulair [Montelukast] Hives   Amlodipine     agitation   Crestor [Rosuvastatin] Other (See Comments)    Muscle Aches     Family History  Problem Relation Age of Onset   Healthy Son    Healthy Daughter     Prior to Admission medications   Medication Sig Start Date End Date Taking? Authorizing Provider  Accu-Chek Softclix Lancets lancets Use daily before breakfast. 04/16/23    Narda Bonds, MD  acetaminophen (TYLENOL) 325 MG tablet Take 2 tablets (650 mg total) by mouth every 6 (six) hours as needed. 04/16/23   Barnetta Chapel, PA-C  albuterol (VENTOLIN HFA) 108 (90 Base) MCG/ACT inhaler Inhale 2 puffs into the lungs as needed (max of 12 puffs per day) 05/04/23     alfuzosin (UROXATRAL) 10 MG 24 hr tablet Take 1 tablet (10 mg total) by mouth daily. 01/03/23     allopurinol (ZYLOPRIM) 300 MG tablet Take 1 tablet (300 mg total) by mouth daily. 05/19/23   Jaci Standard, MD  Blood Glucose Monitoring Suppl (ACCU-CHEK GUIDE) w/Device KIT Use daily before breakfast. 04/16/23   Narda Bonds, MD  CINNAMON PO Take 1 capsule by mouth daily.    [provider]  finasteride (PROSCAR) 5 MG tablet Take 1 tablet (5 mg total) by mouth daily. 02/11/23     glucose blood (ACCU-CHEK GUIDE TEST) test strip Use daily before breakfast. 04/16/23   Narda Bonds, MD  Glucose Blood (BLOOD GLUCOSE TEST STRIPS) STRP Use to check blood sugar before breakfast as directed 04/16/23   Narda Bonds, MD  lidocaine-prilocaine (EMLA) cream Apply 1 Application topically as needed. 05/19/23   Jaci Standard, MD  methocarbamol (ROBAXIN) 500 MG tablet Take 2 tablets (1,000 mg total) by mouth every 8 (eight) hours as needed for muscle spasms. 04/16/23   Barnetta Chapel, PA-C  minoxidil (LONITEN) 2.5 MG tablet Take 1 tablet (2.5 mg total) by mouth daily. 12/03/22     ondansetron (ZOFRAN) 8 MG tablet Take 1 tablet (8 mg total) by mouth every 8 (eight) hours as needed. 05/19/23   Jaci Standard, MD  oxyCODONE (OXY IR/ROXICODONE) 5 MG immediate release tablet Take 1-2 tablets (5-10 mg total) by mouth every 6 (six) hours as needed for severe pain (pain score 7-10). 04/23/23   Jaci Standard, MD  pantoprazole (PROTONIX) 40 MG tablet Take 1 tablet (40 mg total) by mouth daily 30 minutes to 1 hour before morning meal. 05/01/23     predniSONE (DELTASONE) 1 MG tablet Take 4 tablets by mouth daily, along with  a 5mg  tablet for a total of 9mg  daily for 1 month. Taper by 1mg  monthly. 05/27/23   Pollyann Savoy, MD  predniSONE (DELTASONE) 20 MG tablet Take 3 tablets (60 mg total) by mouth daily. Take on Day 1 of chemotherapy and continue for 5 full days. Restart with each cycle. 05/19/23   Jaci Standard, MD  Probiotic TBEC Take 1 capsule by mouth daily.    [provider]  prochlorperazine (COMPAZINE) 10 MG tablet Take 1 tablet (10 mg total) by mouth every 6 (six) hours as needed for nausea or vomiting. 05/19/23   Jaci Standard, MD  sildenafil (  REVATIO) 20 MG tablet Take 2-5 tablets (40-100 mg total) by mouth as directed as needed for sexual funtion. Patient not taking: Reported on 04/23/2023 06/24/22     tadalafil, PAH, (ADCIRCA) 20 MG tablet Take 1 tablet (20 mg total) by mouth every 3 (three) days as needed for sexual activity Patient not taking: Reported on 04/23/2023 08/13/22       Physical Exam: Vitals:   06/03/23 1723  BP: 130/68  Pulse: 96  Resp: 16  Temp: 98.6 F (37 C)  TempSrc: Oral  SpO2: 98%   General:  Appears calm and comfortable and is in NAD Eyes:  PERRL, EOMI, normal lids, iris ENT:  grossly normal hearing, lips & tongue, mmm; appropriate dentition Neck:  no LAD, masses or thyromegaly; no carotid bruits Cardiovascular:  RRR, no m/r/g. No LE edema.  Respiratory:   CTA bilaterally with no wheezes/rales/rhonchi.  Normal respiratory effort. Abdomen:  soft, NT, ND, NABS. Wound vac in place. No erythema, drainage.  Back:   normal alignment, no CVAT Skin:  no rash or induration seen on limited exam. Port in right upper chest. Some erythema, no drainage. Hemangioma right arm  Musculoskeletal:  grossly normal tone BUE/BLE, good ROM, no bony abnormality Lower extremity:  No LE edema.  Limited foot exam with no ulcerations.  2+ distal pulses. Psychiatric:  grossly normal mood and affect, speech fluent and appropriate, AOx3 Neurologic:  CN 2-12 grossly intact, moves all  extremities in coordinated fashion, sensation intact   Radiological Exams on Admission: Independently reviewed - see discussion in A/P where applicable  No results found.   Labs on Admission: I have personally reviewed the available labs and imaging studies at the time of the admission.  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 Leonides Schanz to treatment team and send message  -blood cultures/urine culture obtained before antibiotics given  -CXR shows no consolidation or acute findings, official read pending  -he has no no TTP over abdomen/wound vac with no signs of infection, but will check abdomen/pelvis CT in setting of open wound with newly initiated chemotherapy  -port area tender with some erythema, but reports tenderness after they accessed it today  -continue cefepime and add vanc with possible skin infection from port  -continue IVF  -check RVP, covid/flu/RSV negative. 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 Mesenteric margin is positive  -CHOP based chemo started on 05/26/23 -pegfilgrastim on 05/29/23  -allopurinol 300mg  PO daily for TLS prophylaxis  -acyclovir 400mg  PO BID for VCZ prophylaxis (does not appear to be on this) will f/u on med rec  -oncology to follow   Gastroesophageal reflux disease without esophagitis Continue protonix daily   BPH (benign prostatic hyperplasia) Continue finasteride and alfuzosin   Obstructive sleep apnea Cpap nightly, does not want while inpatient     Advance Care Planning:   Code Status: Full Code   Consults: oncology   DVT Prophylaxis: SCDs   Family Communication: wife at bedside   Severity of Illness: The appropriate patient status for this patient is INPATIENT. Inpatient status is judged to be reasonable and necessary in order to provide the required intensity of service to ensure the patient's safety. The patient's presenting symptoms, physical exam findings, and initial radiographic and laboratory data in the context of their chronic comorbidities is felt to place them at high risk for further clinical deterioration. Furthermore, it is not anticipated that the patient will be medically stable for discharge from the hospital within 2 midnights of admission.    * I certify that at the point of admission it is my clinical judgment that the patient will require inpatient hospital care spanning beyond 2 midnights from the point of admission due to high intensity of service, high risk for further deterioration and high frequency of surveillance required.*  Author: Orland Mustard, MD 06/03/2023 6:18 PM  For on call review [email or web address].

## 2023-06-03 NOTE — Progress Notes (Signed)
 Pharmacy Antibiotic Note  Elijah [name] is a 68 y.o. male admitted on 06/03/2023 with febrile neutropenia.  Pharmacy has been consulted for vancomycin and cefepime dosing.  Plan: Vancomycin 1500 mg IV q24 hr (est AUC 482 based on SCr 1.27; Vd 0.72) Measure vancomycin AUC at steady state as indicated SCr q48h while on vanc MRSA PCR ordered; f/u and narrow vanc as appropriate Cefepime 2 g IV q8 hr     Temp (24hrs), Avg:100.5 F (38.1 C), Min:98.6 F (37 C), Max:101.4 F (38.6 C)  Recent Labs  Lab 06/03/23 1050  WBC 4.6  CREATININE 1.27*    Estimated Creatinine Clearance: 72.1 mL/min (A) (by C-G formula based on SCr of 1.27 mg/dL (H)).    Allergies  Allergen Reactions   Singulair [Montelukast] Hives   Amlodipine     agitation   Crestor [Rosuvastatin] Other (See Comments)    Muscle Aches     Antimicrobials this admission: 3/4 vancomycin >>  3/4 cefepime >>   Dose adjustments this admission: n/a  Microbiology results: 3/4 BCx: sent (note both sets state drawn from right elbow, but with different collection times) 3/4 UCx: sent   Thank you for allowing pharmacy to be a part of this patient's care.  Aaban Griep A 06/03/2023 6:19 PM

## 2023-06-03 NOTE — Assessment & Plan Note (Signed)
 Mild AKI in setting of one day history of nausea and vomiting  Received 1L IVF and will continue IVF overnight  Strict I/O UA done  Avoid nephrotoxic drugs  Follow

## 2023-06-03 NOTE — Assessment & Plan Note (Signed)
 A1C of 7.5 in January 2025 Has been on chronic high doses of prednison and now weaning down to 15mg  daily  SSI and accuchecks qac/hs

## 2023-06-03 NOTE — Assessment & Plan Note (Signed)
 Continue finasteride and alfuzosin

## 2023-06-03 NOTE — Plan of Care (Signed)
  Problem: Education: Goal: Knowledge of General Education information will improve Description: Including pain rating scale, medication(s)/side effects and non-pharmacologic comfort measures 06/03/2023 1805 by Riki Rusk, LPN Outcome: Progressing 06/03/2023 1804 by Riki Rusk, LPN Outcome: Progressing   Problem: Health Behavior/Discharge Planning: Goal: Ability to manage health-related needs will improve 06/03/2023 1805 by Riki Rusk, LPN Outcome: Progressing 06/03/2023 1804 by Riki Rusk, LPN Outcome: Progressing   Problem: Clinical Measurements: Goal: Ability to maintain clinical measurements within normal limits will improve 06/03/2023 1805 by Riki Rusk, LPN Outcome: Progressing 06/03/2023 1804 by Riki Rusk, LPN Outcome: Progressing Goal: Will remain free from infection 06/03/2023 1805 by Riki Rusk, LPN Outcome: Progressing 06/03/2023 1804 by Riki Rusk, LPN Outcome: Progressing Goal: Diagnostic test results will improve 06/03/2023 1805 by Riki Rusk, LPN Outcome: Progressing 06/03/2023 1804 by Riki Rusk, LPN Outcome: Progressing Goal: Respiratory complications will improve 06/03/2023 1805 by Riki Rusk, LPN Outcome: Progressing 06/03/2023 1804 by Riki Rusk, LPN Outcome: Progressing Goal: Cardiovascular complication will be avoided 06/03/2023 1805 by Riki Rusk, LPN Outcome: Progressing 06/03/2023 1804 by Riki Rusk, LPN Outcome: Progressing   Problem: Activity: Goal: Risk for activity intolerance will decrease 06/03/2023 1805 by Riki Rusk, LPN Outcome: Progressing 06/03/2023 1804 by Riki Rusk, LPN Outcome: Progressing   Problem: Nutrition: Goal: Adequate nutrition will be maintained 06/03/2023 1805 by Riki Rusk, LPN Outcome: Progressing 06/03/2023 1804 by Riki Rusk, LPN Outcome: Progressing   Problem: Coping: Goal: Level of anxiety will  decrease 06/03/2023 1805 by Riki Rusk, LPN Outcome: Progressing 06/03/2023 1804 by Riki Rusk, LPN Outcome: Progressing   Problem: Elimination: Goal: Will not experience complications related to bowel motility 06/03/2023 1805 by Riki Rusk, LPN Outcome: Progressing 06/03/2023 1804 by Riki Rusk, LPN Outcome: Progressing Goal: Will not experience complications related to urinary retention 06/03/2023 1805 by Riki Rusk, LPN Outcome: Progressing 06/03/2023 1804 by Riki Rusk, LPN Outcome: Progressing   Problem: Pain Managment: Goal: General experience of comfort will improve and/or be controlled 06/03/2023 1805 by Riki Rusk, LPN Outcome: Progressing 06/03/2023 1804 by Riki Rusk, LPN Outcome: Progressing   Problem: Safety: Goal: Ability to remain free from injury will improve 06/03/2023 1805 by Riki Rusk, LPN Outcome: Progressing 06/03/2023 1804 by Riki Rusk, LPN Outcome: Progressing   Problem: Skin Integrity: Goal: Risk for impaired skin integrity will decrease 06/03/2023 1805 by Riki Rusk, LPN Outcome: Progressing 06/03/2023 1804 by Riki Rusk, LPN Outcome: Progressing

## 2023-06-03 NOTE — Assessment & Plan Note (Signed)
 Initially presented with small bowel perforation, s/p exploratory laparotomy and small bowel resection. 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 Mesenteric margin is positive  -CHOP based chemo started on 05/26/23 -pegfilgrastim on 05/29/23  -allopurinol 300mg  PO daily for TLS prophylaxis  -acyclovir 400mg  PO BID for VCZ prophylaxis (does not appear to be on this) will f/u on med rec  -oncology to follow

## 2023-06-03 NOTE — Assessment & Plan Note (Addendum)
 68 year old with history of newly diagnosed T cell lyphoma s/p small bowel resection in 04/2023 and s/p first round of chemo on 2/24 who presented to cancer clinic for complaints of dizziness and nausea/vomiting found to be febrile to 101.4 and meeting sepsis criteria with tachycardia and fever and initially soft blood pressures that responded to IVF in setting of neutropenia  -admit to med surg  -will add Dr. Leonides Schanz to treatment team and send message  -blood cultures/urine culture obtained before antibiotics given  -CXR shows no consolidation or acute findings, official read pending  -he has no no TTP over abdomen/wound vac with no signs of infection, but will check abdomen/pelvis CT in setting of open wound with newly initiated chemotherapy  -port area tender with some erythema, but reports tenderness after they accessed it today  -continue cefepime and add vanc with possible skin infection from port  -continue IVF  -check RVP, covid/flu/RSV negative. Reports some congestion -trend lactic acid  -trend CBC/follow ANC (1.5 today)

## 2023-06-03 NOTE — Plan of Care (Signed)

## 2023-06-04 ENCOUNTER — Encounter (HOSPITAL_COMMUNITY): Payer: Self-pay | Admitting: Family Medicine

## 2023-06-04 ENCOUNTER — Other Ambulatory Visit: Payer: Self-pay

## 2023-06-04 DIAGNOSIS — C862 Enteropathy-type (intestinal) T-cell lymphoma not having achieved remission: Secondary | ICD-10-CM

## 2023-06-04 DIAGNOSIS — R5081 Fever presenting with conditions classified elsewhere: Secondary | ICD-10-CM | POA: Diagnosis not present

## 2023-06-04 DIAGNOSIS — D709 Neutropenia, unspecified: Secondary | ICD-10-CM | POA: Diagnosis not present

## 2023-06-04 LAB — CBC WITH DIFFERENTIAL/PLATELET
Abs Immature Granulocytes: 1.27 10*3/uL — ABNORMAL HIGH (ref 0.00–0.07)
Basophils Absolute: 0 10*3/uL (ref 0.0–0.1)
Basophils Relative: 0 %
Eosinophils Absolute: 0.1 10*3/uL (ref 0.0–0.5)
Eosinophils Relative: 1 %
HCT: 32.9 % — ABNORMAL LOW (ref 39.0–52.0)
Hemoglobin: 10.2 g/dL — ABNORMAL LOW (ref 13.0–17.0)
Immature Granulocytes: 10 %
Lymphocytes Relative: 7 %
Lymphs Abs: 0.8 10*3/uL (ref 0.7–4.0)
MCH: 28.3 pg (ref 26.0–34.0)
MCHC: 31 g/dL (ref 30.0–36.0)
MCV: 91.1 fL (ref 80.0–100.0)
Monocytes Absolute: 1.8 10*3/uL — ABNORMAL HIGH (ref 0.1–1.0)
Monocytes Relative: 14 %
Neutro Abs: 8.4 10*3/uL — ABNORMAL HIGH (ref 1.7–7.7)
Neutrophils Relative %: 68 %
Platelets: 201 10*3/uL (ref 150–400)
RBC: 3.61 MIL/uL — ABNORMAL LOW (ref 4.22–5.81)
RDW: 15.1 % (ref 11.5–15.5)
WBC: 12.5 10*3/uL — ABNORMAL HIGH (ref 4.0–10.5)
nRBC: 0.2 % (ref 0.0–0.2)

## 2023-06-04 LAB — GLUCOSE, CAPILLARY
Glucose-Capillary: 126 mg/dL — ABNORMAL HIGH (ref 70–99)
Glucose-Capillary: 139 mg/dL — ABNORMAL HIGH (ref 70–99)
Glucose-Capillary: 177 mg/dL — ABNORMAL HIGH (ref 70–99)

## 2023-06-04 LAB — BASIC METABOLIC PANEL
Anion gap: 10 (ref 5–15)
BUN: 18 mg/dL (ref 8–23)
CO2: 21 mmol/L — ABNORMAL LOW (ref 22–32)
Calcium: 8.4 mg/dL — ABNORMAL LOW (ref 8.9–10.3)
Chloride: 105 mmol/L (ref 98–111)
Creatinine, Ser: 0.96 mg/dL (ref 0.61–1.24)
GFR, Estimated: >60 mL/min (ref >60)
Glucose, Bld: 119 mg/dL — ABNORMAL HIGH (ref 70–99)
Potassium: 3.8 mmol/L (ref 3.5–5.1)
Sodium: 136 mmol/L (ref 135–145)

## 2023-06-04 LAB — URINE CULTURE: Culture: <10000 — AB

## 2023-06-04 MED ORDER — METRONIDAZOLE 500 MG/100ML IV SOLN
500.0000 mg | Freq: Two times a day (BID) | INTRAVENOUS | Status: DC
Start: 2023-06-04 — End: 2023-06-07
  Administered 2023-06-04 – 2023-06-07 (×7): 500 mg via INTRAVENOUS
  Filled 2023-06-04 (×7): qty 100

## 2023-06-04 MED ORDER — METOPROLOL TARTRATE 5 MG/5ML IV SOLN: 5.0000 mg | INTRAVENOUS | Status: DC | PRN

## 2023-06-04 MED ORDER — LORATADINE 10 MG PO TABS
10.0000 mg | ORAL_TABLET | Freq: Every day | ORAL | Status: DC
  Administered 2023-06-05 – 2023-06-07 (×3): 10 mg via ORAL
  Filled 2023-06-04 (×3): qty 1

## 2023-06-04 MED ORDER — TRAZODONE HCL 50 MG PO TABS
50.0000 mg | ORAL_TABLET | Freq: Every evening | ORAL | Status: DC | PRN
  Administered 2023-06-06: 50 mg via ORAL
  Filled 2023-06-04: qty 1

## 2023-06-04 MED ORDER — HYDRALAZINE HCL 20 MG/ML IJ SOLN: 10.0000 mg | INTRAMUSCULAR | Status: DC | PRN

## 2023-06-04 MED ORDER — IPRATROPIUM-ALBUTEROL 0.5-2.5 (3) MG/3ML IN SOLN: 3.0000 mL | RESPIRATORY_TRACT | Status: DC | PRN

## 2023-06-04 MED ORDER — SENNOSIDES-DOCUSATE SODIUM 8.6-50 MG PO TABS: 1.0000 | ORAL_TABLET | Freq: Every evening | ORAL | Status: DC | PRN

## 2023-06-04 NOTE — Progress Notes (Signed)
 [facility] Progress Note     Subjective: CC:  [name] is known to our service from an admission 1/9-1/16 for small bowel mass with perforation. He underwent ex lap, SBR 04/10/23 by Dr. 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 He got his first cycle of chemo one week ago (05/26/23) and follows with Dr. Leonides Schanz. He presented to the hospital from cancer clinic with neutropenic fever and dizziness. He also reports chills. He reports tolerating PO, including juven and fiarlife protein shakes. He is having flatus along with daily Bms. CT of the abdomen and pelvis was performed and showed large necrotic mesenteric mass now containing air.    Objective: Vital signs in last 24 hours: Temp:  [98.6 F (37 C)-101.8 F (38.8 C)] 99.1 F (37.3 C) (03/05 0755) Pulse Rate:  [93-111] 106 (03/05 0755) Resp:  [16-18] 16 (03/05 0755) BP: (102-144)/(58-73) 143/68 (03/05 0755) SpO2:  [94 %-98 %] 94 % (03/05 0755) Last BM Date : 06/03/23  Intake/Output from previous day: 03/04 0701 - 03/05 0700 In: 560.4 [I.V.:160.4; IV Piggyback:400] Out: -  Intake/Output this shift: No intake/output data recorded.  PE: Gen:  Alert, NAD, pleasant Card:  Regular rate and rhythm Pulm:  Normal effort ORA  Abd: Soft, non-tender, mild distention, VAC in place holding suction - wife shows photo on her phone that looks like healthy granulation tissue.  Skin: warm and dry, no rashes  Psych: A&Ox3   Lab Results:  Recent Labs    06/03/23 1050 06/04/23 0443  WBC 4.6 12.5*  HGB 11.8* 10.2*  HCT 36.6* 32.9*  PLT 250 201   BMET Recent Labs    06/03/23 1050 06/04/23 0443  NA 140 136  K 3.7 3.8  CL 104 105  CO2 25 21*  GLUCOSE 169* 119*  BUN 23 18  CREATININE 1.27* 0.96  CALCIUM 9.3 8.4*   PT/INR No results for input(s): "LABPROT", "INR" in the last 72 hours. CMP     Component Value Date/Time   NA 136 06/04/2023 0443   K 3.8 06/04/2023 0443   CL 105 06/04/2023 0443   CO2  21 (L) 06/04/2023 0443   GLUCOSE 119 (H) 06/04/2023 0443   BUN 18 06/04/2023 0443   CREATININE 0.96 06/04/2023 0443   CREATININE 1.27 (H) 06/03/2023 1050   CALCIUM 8.4 (L) 06/04/2023 0443   PROT 6.8 06/03/2023 1050   ALBUMIN 3.7 06/03/2023 1050   AST 13 (L) 06/03/2023 1050   ALT 22 06/03/2023 1050   ALKPHOS 66 06/03/2023 1050   BILITOT 0.5 06/03/2023 1050   GFRNONAA >60 06/04/2023 0443   GFRNONAA >60 06/03/2023 1050   GFRAA  06/17/2007 2140    >60        The eGFR has been calculated using the MDRD equation. This calculation has not been validated in all clinical   Lipase     Component Value Date/Time   LIPASE 11 04/10/2023 0629       Studies/Results: CT ABDOMEN PELVIS W CONTRAST Result Date: 06/04/2023 CLINICAL DATA:  History of recent small bowel resection with ongoing nausea, initial encounter EXAM: CT ABDOMEN AND PELVIS WITH CONTRAST TECHNIQUE: Multidetector CT imaging of the abdomen and pelvis was performed using the standard protocol following bolus administration of intravenous contrast. RADIATION DOSE REDUCTION: This exam was performed according to the departmental dose-optimization program which includes automated exposure control, adjustment of the mA and/or kV according to patient size and/or use of iterative reconstruction technique. CONTRAST:  OMNIPAQUE IOHEXOL 300 MG/ML  SOLN COMPARISON:  05/08/2023 FINDINGS: Lower chest: No acute abnormality. Hepatobiliary: No focal liver abnormality is seen. No gallstones, gallbladder wall thickening, or biliary dilatation. Pancreas: Unremarkable. No pancreatic ductal dilatation or surrounding inflammatory changes. Spleen: Normal in size without focal abnormality. Adrenals/Urinary Tract: Adrenal glands are within normal limits bilaterally. Kidneys demonstrate a normal enhancement pattern. Bilateral renal cysts are noted. No follow-up is recommended. No obstructive changes are seen. The bladder is partially distended. Stomach/Bowel:  No obstructive or inflammatory changes of the colon are noted. The appendix is within normal limits. Surgical changes are noted in the small bowel. No obstructive changes are seen. Stomach is within normal limits. Vascular/Lymphatic: Mild vascular calcifications are noted. Scattered small mesenteric lymph nodes are noted. A large centrally necrotic mass lesion is noted within the mesentery to the left of the midline similar to that seen on the prior exam. Air is noted within now consistent with increasing infection. The mass now measures approximately 11.5 x 9.1 cm. Reproductive: Prostate is unremarkable. Other: No abdominal wall hernia or abnormality. No abdominopelvic ascites. Musculoskeletal: No acute or significant osseous findings. IMPRESSION: Large centrally necrotic mass lesion is again identified within the mesentery. Increased air is noted within likely related to super infection. Previously seen surgical drain has been removed. No other focal abnormality is noted. Electronically Signed   By: Alcide Clever M.D.   On: 06/04/2023 01:42   DG CHEST PORT 1 VIEW Result Date: 06/03/2023 CLINICAL DATA:  Neutropenic fever EXAM: PORTABLE CHEST 1 VIEW COMPARISON:  05/02/2023 PET-CT FINDINGS: Right chest wall port is noted in satisfactory position. Cardiac shadow is within normal limits. The lungs are well aerated without focal infiltrate. No bony abnormality is seen. IMPRESSION: No acute abnormality noted. Electronically Signed   By: Alcide Clever M.D.   On: 06/03/2023 21:09    Anti-infectives: Anti-infectives (From admission, onward)    Start     Dose/Rate Route Frequency Ordered Stop   06/04/23 0930  metroNIDAZOLE (FLAGYL) IVPB 500 mg        500 mg 100 mL/hr over 60 Minutes Intravenous Every 12 hours 06/04/23 0846     06/03/23 2000  vancomycin (VANCOREADY) IVPB 1500 mg/300 mL  Status:  Discontinued        1,500 mg 150 mL/hr over 120 Minutes Intravenous Daily 06/03/23 1819 06/04/23 0846   06/03/23 2000   ceFEPIme (MAXIPIME) 2 g in sodium chloride 0.9 % 100 mL IVPB        2 g 200 mL/hr over 30 Minutes Intravenous Every 8 hours 06/03/23 1819          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 Hillery Hunter  Patient with known positive tumor margins who has evidence of tumor necrosis of the mesentery and possible superinfection. He has a fever but is hemodynamically stable with a benign abdominal exam. Reviewed his CT with IR Dr. Archer Asa who feels the necrotic tumor may communicate with bowel. The patient does not have a bowel obstruction from this process and is on active chemotherapy. No emergent surgical needs. Agree with IV abx.     LOS: 1 day   I reviewed nursing notes, Consultant oncology notes, hospitalist notes, last 24 h vitals and pain scores, last 48 h intake and output, last 24 h labs and trends, and last 24 h imaging results.  This care required moderate level of medical decision making.   Hosie Spangle, PA-C [facility]  Please see Amion for pager number during day hours 7:00am-4:30pm

## 2023-06-04 NOTE — Progress Notes (Signed)
 Mobility Specialist - Progress Note   06/04/23 1412  Mobility  Activity Ambulated independently in hallway  Level of Assistance Independent  Assistive Device None  Distance Ambulated (ft) 500 ft  Activity Response Tolerated well  Mobility Referral Yes  Mobility visit 1 Mobility  Mobility Specialist Start Time (ACUTE ONLY) 1402  Mobility Specialist Stop Time (ACUTE ONLY) 1412  Mobility Specialist Time Calculation (min) (ACUTE ONLY) 10 min   Pt received in bed and agreeable to mobility. No complaints during session. Pt to bathroom after session with all needs met.    [facility]

## 2023-06-04 NOTE — Plan of Care (Signed)
   Problem: Education: Goal: Knowledge of General Education information will improve Description: Including pain rating scale, medication(s)/side effects and non-pharmacologic comfort measures Outcome: Progressing   Problem: Health Behavior/Discharge Planning: Goal: Ability to manage health-related needs will improve Outcome: Progressing   Problem: Clinical Measurements: Goal: Ability to maintain clinical measurements within normal limits will improve Outcome: Progressing Goal: Will remain free from infection Outcome: Progressing Goal: Diagnostic test results will improve Outcome: Progressing Goal: Respiratory complications will improve Outcome: Progressing Goal: Cardiovascular complication will be avoided Outcome: Progressing   Problem: Activity: Goal: Risk for activity intolerance will decrease Outcome: Progressing   Problem: Nutrition: Goal: Adequate nutrition will be maintained Outcome: Progressing   Problem: Coping: Goal: Level of anxiety will decrease Outcome: Progressing   Problem: Elimination: Goal: Will not experience complications related to bowel motility Outcome: Progressing Goal: Will not experience complications related to urinary retention Outcome: Progressing   Problem: Pain Managment: Goal: General experience of comfort will improve and/or be controlled Outcome: Progressing   Problem: Safety: Goal: Ability to remain free from injury will improve Outcome: Progressing   Problem: Skin Integrity: Goal: Risk for impaired skin integrity will decrease Outcome: Progressing   Problem: Education: Goal: Ability to describe self-care measures that may prevent or decrease complications (Diabetes Survival Skills Education) will improve Outcome: Progressing Goal: Individualized Educational Video(s) Outcome: Progressing   Problem: Coping: Goal: Ability to adjust to condition or change in health will improve Outcome: Progressing   Problem: Fluid  Volume: Goal: Ability to maintain a balanced intake and output will improve Outcome: Progressing   Problem: Health Behavior/Discharge Planning: Goal: Ability to identify and utilize available resources and services will improve Outcome: Progressing Goal: Ability to manage health-related needs will improve Outcome: Progressing   Problem: Metabolic: Goal: Ability to maintain appropriate glucose levels will improve Outcome: Progressing   Problem: Nutritional: Goal: Maintenance of adequate nutrition will improve Outcome: Progressing Goal: Progress toward achieving an optimal weight will improve Outcome: Progressing   Problem: Skin Integrity: Goal: Risk for impaired skin integrity will decrease Outcome: Progressing   Problem: Tissue Perfusion: Goal: Adequacy of tissue perfusion will improve Outcome: Progressing   Problem: Education: Goal: Ability to describe self-care measures that may prevent or decrease complications (Diabetes Survival Skills Education) will improve Outcome: Progressing Goal: Individualized Educational Video(s) Outcome: Progressing   Problem: Coping: Goal: Ability to adjust to condition or change in health will improve Outcome: Progressing   Problem: Fluid Volume: Goal: Ability to maintain a balanced intake and output will improve Outcome: Progressing   Problem: Health Behavior/Discharge Planning: Goal: Ability to identify and utilize available resources and services will improve Outcome: Progressing Goal: Ability to manage health-related needs will improve Outcome: Progressing   Problem: Metabolic: Goal: Ability to maintain appropriate glucose levels will improve Outcome: Progressing   Problem: Nutritional: Goal: Maintenance of adequate nutrition will improve Outcome: Progressing Goal: Progress toward achieving an optimal weight will improve Outcome: Progressing   Problem: Skin Integrity: Goal: Risk for impaired skin integrity will  decrease Outcome: Progressing   Problem: Tissue Perfusion: Goal: Adequacy of tissue perfusion will improve Outcome: Progressing

## 2023-06-04 NOTE — Hospital Course (Addendum)
 Brief Narrative:  68 year old with history of recently diagnosed T-cell lymphoma started chemo last week, GERD, BPH, PMNR, HLD, prediabetes, OSA presented from the cancer clinic for dizziness.  Patient was found to be febrile, hypotensive and tachycardic.  Initially broad-spectrum antibiotics were started and admitted for SIRS  He was admitted in January for perforated small bowel and was diagnosed with lymphoma requiring small bowel resection.   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  First round of chemo 2/24. -This patient does not have neutropenia -COVID/flu/RSV-negative - Respiratory panel still pending.  Cultures from the clinic are negative, UA is negative.  Chest x-ray is negative - CT abdomen pelvis shows large previously noted mesenteric necrotic lesion with some evidence of air with possible superimposed infection.  Discussed with general surgery.  Due to rising WBC, plans for IR drain placement.  - Change antibiotics to Rocephin and Flagyl.  Leukocytosis possibly related to prednisone vs infection.   Hypokalemia - As needed repletion    AKI (acute kidney injury) (HCC), resolved -Admission creatinine 1.2, now at baseline 0.9   Steroid-induced diabetes (HCC) On chronic prednisone.  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 Leonides Schanz   Gastroesophageal reflux disease without  esophagitis Continue protonix daily    BPH (benign prostatic hyperplasia) Continue finasteride and alfuzosin    Obstructive sleep apnea Cpap nightly, does not want while inpatient    DVT prophylaxis: SCDs Start: 06/03/23 1755    Code Status: Full Code Family Communication: Spouse at bedside Status is: Inpatient Remains inpatient appropriate because: Hopefully home in next 24 hours if WBC stabilizes or trends down  Subjective: Feels ok Remains afebrile.   Examination:  General exam: Appears calm and comfortable  Respiratory system: Clear to auscultation. Respiratory effort normal. Cardiovascular system: S1 & S2 heard, RRR. No JVD, murmurs, rubs, gallops or clicks. No pedal edema. Gastrointestinal system: Abdomen is nondistended, soft and nontender. No organomegaly or masses felt. Normal bowel sounds heard. Central nervous system: Alert and oriented. No focal neurological deficits. Extremities: Symmetric 5 x 5 power. Skin: No rashes, lesions or ulcers Psychiatry: Judgement and insight appear normal. Mood & affect appropriate.

## 2023-06-04 NOTE — Plan of Care (Signed)
  Problem: Education: Goal: Knowledge of General Education information will improve Description: Including pain rating scale, medication(s)/side effects and non-pharmacologic comfort measures Outcome: Not Progressing   Problem: Health Behavior/Discharge Planning: Goal: Ability to manage health-related needs will improve Outcome: Not Progressing   Problem: Clinical Measurements: Goal: Ability to maintain clinical measurements within normal limits will improve Outcome: Not Progressing Goal: Will remain free from infection Outcome: Not Progressing Goal: Diagnostic test results will improve Outcome: Not Progressing Goal: Respiratory complications will improve Outcome: Not Progressing Goal: Cardiovascular complication will be avoided Outcome: Not Progressing   Problem: Activity: Goal: Risk for activity intolerance will decrease Outcome: Not Progressing   Problem: Nutrition: Goal: Adequate nutrition will be maintained Outcome: Not Progressing   Problem: Coping: Goal: Level of anxiety will decrease Outcome: Not Progressing   Problem: Elimination: Goal: Will not experience complications related to bowel motility Outcome: Not Progressing Goal: Will not experience complications related to urinary retention Outcome: Not Progressing   Problem: Pain Managment: Goal: General experience of comfort will improve and/or be controlled Outcome: Not Progressing   Problem: Safety: Goal: Ability to remain free from injury will improve Outcome: Not Progressing   Problem: Skin Integrity: Goal: Risk for impaired skin integrity will decrease Outcome: Not Progressing   Problem: Education: Goal: Ability to describe self-care measures that may prevent or decrease complications (Diabetes Survival Skills Education) will improve Outcome: Not Progressing Goal: Individualized Educational Video(s) Outcome: Not Progressing   Problem: Coping: Goal: Ability to adjust to condition or change in  health will improve Outcome: Not Progressing   Problem: Fluid Volume: Goal: Ability to maintain a balanced intake and output will improve Outcome: Not Progressing   Problem: Health Behavior/Discharge Planning: Goal: Ability to identify and utilize available resources and services will improve Outcome: Not Progressing Goal: Ability to manage health-related needs will improve Outcome: Not Progressing   Problem: Metabolic: Goal: Ability to maintain appropriate glucose levels will improve Outcome: Not Progressing   Problem: Nutritional: Goal: Maintenance of adequate nutrition will improve Outcome: Not Progressing Goal: Progress toward achieving an optimal weight will improve Outcome: Not Progressing   Problem: Skin Integrity: Goal: Risk for impaired skin integrity will decrease Outcome: Not Progressing   Problem: Tissue Perfusion: Goal: Adequacy of tissue perfusion will improve Outcome: Not Progressing   Problem: Education: Goal: Ability to describe self-care measures that may prevent or decrease complications (Diabetes Survival Skills Education) will improve Outcome: Not Progressing Goal: Individualized Educational Video(s) Outcome: Not Progressing   Problem: Coping: Goal: Ability to adjust to condition or change in health will improve Outcome: Not Progressing   Problem: Fluid Volume: Goal: Ability to maintain a balanced intake and output will improve Outcome: Not Progressing   Problem: Health Behavior/Discharge Planning: Goal: Ability to identify and utilize available resources and services will improve Outcome: Not Progressing Goal: Ability to manage health-related needs will improve Outcome: Not Progressing   Problem: Metabolic: Goal: Ability to maintain appropriate glucose levels will improve Outcome: Not Progressing   Problem: Nutritional: Goal: Maintenance of adequate nutrition will improve Outcome: Not Progressing Goal: Progress toward achieving an  optimal weight will improve Outcome: Not Progressing   Problem: Skin Integrity: Goal: Risk for impaired skin integrity will decrease Outcome: Not Progressing   Problem: Tissue Perfusion: Goal: Adequacy of tissue perfusion will improve Outcome: Not Progressing

## 2023-06-04 NOTE — Progress Notes (Signed)
 PROGRESS NOTE    Elijah [name]  ZOX:[id number] DOB: 05-29-1955 DOA: 06/03/2023 PCP: Tally Joe, MD    Brief Narrative:  68 year old with history of recently diagnosed T-cell lymphoma started chemo last week, GERD, BPH, PMNR, HLD, prediabetes, OSA presented from the cancer clinic for dizziness.  Patient was found to be febrile, hypotensive and tachycardic.  Initially broad-spectrum antibiotics were started and admitted for SIRS  He was admitted in January for perforated small bowel and was diagnosed with lymphoma requiring small bowel resection.   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  First round of chemo 2/24. -This patient does not have neutropenia -COVID/flu/RSV-negative - Respiratory panel pending.  Cultures from the clinic are negative, UA is negative.  Chest x-ray is negative - CT abdomen pelvis shows large previously noted mesenteric necrotic lesion with some evidence of air with possible superimposed infection.  Discussed with general surgery.  No surgical indication at this point.  Continue antibiotics. -Broad-spectrum antibiotics.  I will tentatively plan to treat for about 7-10 days    AKI (acute kidney injury) (HCC), resolved -Admission creatinine 1.2, now at baseline 0.9   Steroid-induced diabetes (HCC) On chronic prednisone.  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 Mesenteric margin is positive  -CHOP based chemo started on 05/26/23 -pegfilgrastim on 05/29/23  -allopurinol 300mg  PO daily for TLS prophylaxis  -acyclovir 400mg  PO BID for VCZ prophylaxis (does not appear to be on this) will f/u on med rec  -oncology to follow    Gastroesophageal reflux disease without esophagitis Continue protonix daily    BPH (benign prostatic hyperplasia) Continue finasteride and alfuzosin    Obstructive sleep apnea Cpap nightly, does not want while inpatient    DVT prophylaxis: SCDs Start: 06/03/23 1755    Code Status: Full Code Family Communication: Spouse at bedside Status is: Inpatient Remains inpatient appropriate because: Continue hospital stay    Subjective:  Seen at bedside, no complaints.  Tells me his pain is slightly better controlled today.  Examination:  General exam: Appears calm and comfortable  Respiratory system: Clear to auscultation. Respiratory effort normal. Cardiovascular system: S1 & S2 heard, RRR. No JVD, murmurs, rubs, gallops or clicks. No pedal edema. Gastrointestinal system: Abdomen is nondistended, soft and nontender. No organomegaly or masses felt. Normal bowel sounds heard. Central nervous system: Alert and oriented. No focal neurological deficits. Extremities: Symmetric 5 x 5 power. Skin: No rashes, lesions or ulcers Psychiatry: Judgement and insight appear normal. Mood & affect appropriate.                Diet Orders (From admission, onward)     Start     Ordered   06/03/23 1749  Diet regular Room service appropriate? Yes; Fluid consistency: Thin  Diet effective now       Question  Answer Comment  Room service appropriate? Yes   Fluid consistency: Thin      06/03/23  1748            Objective: Vitals:   06/04/23 0400 06/04/23 0432 06/04/23 0755 06/04/23 1102  BP: (!) 144/66 (!) 144/66 (!) 143/68 (!) 143/68  Pulse: 100 (!) 103 (!) 106 (!) 106  Resp: 18 18 16 16   Temp: 99.2 F (37.3 C) 99.2 F (37.3 C) 99.1 F (37.3 C) 99.1 F (37.3 C)  TempSrc: Oral Oral Oral Oral  SpO2: 94% 94% 94%   Weight:    102.9 kg  Height:    6\' 1"  (1.854 m)    Intake/Output Summary (Last 24 hours) at 06/04/2023 1133 Last data filed at 06/04/2023 1044 Gross per 24 hour  Intake 800.4 ml  Output --  Net 800.4 ml   Filed Weights   06/04/23 1102  Weight: 102.9 kg    Scheduled Meds:  alfuzosin  10 mg Oral Daily   allopurinol  300 mg Oral Daily   finasteride  5 mg Oral Daily   insulin aspart  0-9 Units Subcutaneous TID WC   pantoprazole  40 mg Oral Daily   predniSONE  15 mg Oral Daily   Continuous Infusions:  sodium chloride 100 mL/hr at 06/03/23 2229   ceFEPime (MAXIPIME) IV 2 g (06/04/23 0548)   metronidazole 500 mg (06/04/23 0941)    Nutritional status     Body mass index is 29.93 kg/m.  Data Reviewed:   CBC: Recent Labs  Lab 06/03/23 1050 06/04/23 0443  WBC 4.6 12.5*  NEUTROABS 1.5* 8.4*  HGB 11.8* 10.2*  HCT 36.6* 32.9*  MCV 86.3 91.1  PLT 250 201   Basic Metabolic Panel: Recent Labs  Lab 06/03/23 1050 06/04/23 0443  NA 140 136  K 3.7 3.8  CL 104 105  CO2 25 21*  GLUCOSE 169* 119*  BUN 23 18  CREATININE 1.27* 0.96  CALCIUM 9.3 8.4*   GFR: Estimated Creatinine Clearance: 95.4 mL/min (by C-G formula based on SCr of 0.96 mg/dL). Liver Function Tests: Recent Labs  Lab 06/03/23 1050  AST 13*  ALT 22  ALKPHOS 66  BILITOT 0.5  PROT 6.8  ALBUMIN 3.7   No results for input(s): "LIPASE", "AMYLASE" in the last 168 hours. No results for input(s): "AMMONIA" in the last 168 hours. Coagulation Profile: No results for input(s): "INR", "PROTIME" in the last 168 hours. Cardiac Enzymes: No results for input(s): "CKTOTAL",  "CKMB", "CKMBINDEX", "TROPONINI" in the last 168 hours. BNP (last 3 results) No results for input(s): "PROBNP" in the last 8760 hours. HbA1C: No results for input(s): "HGBA1C" in the last 72 hours. CBG: Recent Labs  Lab 06/03/23 2144 06/04/23 0804 06/04/23 1107  GLUCAP 142* 126* 139*   Lipid Profile: No results for input(s): "CHOL", "HDL", "LDLCALC", "TRIG", "CHOLHDL", "LDLDIRECT" in the last 72 hours. Thyroid Function Tests: No results for input(s): "TSH", "T4TOTAL", "FREET4", "T3FREE", "THYROIDAB" in the last 72 hours. Anemia Panel: No results for input(s): "VITAMINB12", "FOLATE", "FERRITIN", "TIBC", "IRON", "RETICCTPCT" in the last 72 hours. Sepsis Labs: Recent Labs  Lab 06/03/23 1928 06/03/23 2302  LATICACIDVEN 3.0* 2.1*    Recent Results (from the past 240 hours)  Resp panel by RT-PCR (RSV, Flu A&B, Covid) Anterior Nasal Swab     Status: None   Collection Time: 06/03/23 11:51 AM   Specimen: Anterior Nasal Swab  Result Value Ref Range Status   SARS Coronavirus 2 by RT PCR NEGATIVE NEGATIVE Final  Influenza A by PCR NEGATIVE NEGATIVE Final   Influenza B by PCR NEGATIVE NEGATIVE Final    Comment: (NOTE) The Xpert Xpress SARS-CoV-2/FLU/RSV plus assay is intended as an aid in the diagnosis of influenza from Nasopharyngeal swab specimens and should not be used as a sole basis for treatment. Nasal washings and aspirates are unacceptable for Xpert Xpress SARS-CoV-2/FLU/RSV testing.  Fact Sheet for Patients: BloggerCourse.com  Fact Sheet for Healthcare Providers: SeriousBroker.it  This test is not yet approved or cleared by the [place] FDA and has been authorized for detection and/or diagnosis of SARS-CoV-2 by FDA under an Emergency Use Authorization (EUA). This EUA will remain in effect (meaning this test can be used) for the duration of the COVID-19 declaration under Section 564(b)(1) of the Act, 21  U.S.C. section 360bbb-3(b)(1), unless the authorization is terminated or revoked.     Resp Syncytial Virus by PCR NEGATIVE NEGATIVE Final    Comment: (NOTE) Fact Sheet for Patients: BloggerCourse.com  Fact Sheet for Healthcare Providers: SeriousBroker.it  This test is not yet approved or cleared by the [place] FDA and has been authorized for detection and/or diagnosis of SARS-CoV-2 by FDA under an Emergency Use Authorization (EUA). This EUA will remain in effect (meaning this test can be used) for the duration of the COVID-19 declaration under Section 564(b)(1) of the Act, 21 U.S.C. section 360bbb-3(b)(1), unless the authorization is terminated or revoked.  Performed at [facility], [address] [place], [place] [address]   Culture, blood (routine x 2)     Status: None (Preliminary result)   Collection Time: 06/03/23 12:11 PM   Specimen: BLOOD  Result Value Ref Range Status   Specimen Description   Final    BLOOD RIGHT ANTECUBITAL Performed at [facility], [address], [place], [place] [address]    Special Requests   Final    NONE Performed at [facility] [place] [facility], [address] [place], [place] [address]    Culture   Final    NO GROWTH < 24 HOURS Performed at [facility], [address] [place], [place] [address]    Report Status PENDING  Incomplete  Culture, blood (routine x 2)     Status: None (Preliminary result)   Collection Time: 06/03/23 12:26 PM   Specimen: BLOOD  Result Value Ref Range Status   Specimen Description   Final    BLOOD RIGHT ANTECUBITAL Performed at [facility], [address], [place], [place] [address]    Special Requests   Final    RIGHT ANTECUBITAL Performed at [facility], [address] [place], [place] [address]    Culture   Final    NO GROWTH < 24 HOURS Performed at [facility], [address] [place], [place] [address]    Report Status PENDING  Incomplete         Radiology Studies: CT ABDOMEN PELVIS W CONTRAST Result Date: 06/04/2023 CLINICAL DATA:  History of recent small bowel resection with ongoing nausea, initial encounter EXAM: CT ABDOMEN AND PELVIS WITH CONTRAST TECHNIQUE: Multidetector CT imaging of the abdomen and pelvis was performed using the standard protocol following bolus administration of intravenous contrast. RADIATION DOSE REDUCTION: This exam was performed according to the departmental dose-optimization program which includes automated exposure control, adjustment of the mA and/or kV according to patient size and/or use of iterative reconstruction technique. CONTRAST:  OMNIPAQUE IOHEXOL 300 MG/ML  SOLN  COMPARISON:  05/08/2023 FINDINGS: Lower chest: No acute abnormality. Hepatobiliary: No focal liver abnormality is seen. No gallstones, gallbladder wall thickening, or biliary dilatation. Pancreas: Unremarkable. No pancreatic ductal dilatation or surrounding inflammatory changes. Spleen: Normal in size without focal abnormality. Adrenals/Urinary Tract: Adrenal glands are within normal limits bilaterally. Kidneys demonstrate a normal enhancement pattern. Bilateral renal cysts are noted. No follow-up is recommended. No obstructive changes are seen. The bladder is partially distended. Stomach/Bowel: No obstructive or inflammatory changes of the colon are noted. The appendix is within normal limits. Surgical changes are noted in the small bowel. No obstructive changes are seen. Stomach is within normal limits. Vascular/Lymphatic: Mild vascular calcifications are noted. Scattered small mesenteric lymph nodes are noted. A large centrally necrotic mass lesion is noted within the mesentery to the left of the midline similar to that seen on the prior exam. Air is noted within now consistent with increasing infection. The mass now measures approximately 11.5 x 9.1 cm.  Reproductive: Prostate is unremarkable. Other: No abdominal wall hernia or abnormality. No abdominopelvic ascites. Musculoskeletal: No acute or significant osseous findings. IMPRESSION: Large centrally necrotic mass lesion is again identified within the mesentery. Increased air is noted within likely related to super infection. Previously seen surgical drain has been removed. No other focal abnormality is noted. Electronically Signed   By: Alcide Clever M.D.   On: 06/04/2023 01:42   DG CHEST PORT 1 VIEW Result Date: 06/03/2023 CLINICAL DATA:  Neutropenic fever EXAM: PORTABLE CHEST 1 VIEW COMPARISON:  05/02/2023 PET-CT FINDINGS: Right chest wall port is noted in satisfactory position. Cardiac shadow is within normal limits. The lungs are well aerated without focal infiltrate. No bony abnormality is seen. IMPRESSION: No acute abnormality noted. Electronically Signed   By: Alcide Clever M.D.   On: 06/03/2023 21:09           LOS: 1 day   Time spent= 35 mins    Miguel Rota, MD [facility]  If 7PM-7AM, please contact night-coverage  06/04/2023, 11:33 AM

## 2023-06-04 NOTE — Progress Notes (Signed)
 Elijah [name]   DOB:01-Nov-1955   ZO#:[id number]      ASSESSMENT & PLAN:  1.  Fever - Patient admitted 06/03/2023 due to elevated temp.  Has been intermittent since yesterday with most recent temp 99.2 today. - COVID and flu all negative.  Cultures with NGTD. - Continue IV antibiotics and antifungals as ordered - WBC and ANC stable today.  Patient is not neutropenic. - Monitor fever curve - Continue supportive care  2.  T-cell lymphoma - Diagnosed 04/10/2023.  Initially presented with small bowel perforation.  Status post exploratory lap and small bowel resection. - Pathology of small bowel and mass resection 04/10/2023 showed T-cell lymphoma.  Ascitic fluid was negative for malignant cells.  Mesenteric margin was positive. - PET/CT scan done 05/02/2023 shows intensely hypermetabolic mass left central abdomen following small bowel resection for intestinal lymphoma.  No evidence of hypermetabolic adenopathy in the abdomen and pelvis, no evidence of mets in the chest or pelvis. - Chemotherapy initiated on 05/26/2023 with G-CSF given subsequently. - CT abdomen pelvis 06/03/2023 shows large centrally necrotic mass lesion within the mesentery.  Increased air noted within likely related to superinfection. - Medical oncology/Dr. Leonides Schanz following.  Next outpatient oncology visit scheduled for 06/17/2023.  3.  Anemia - Hemoglobin 10.2 today - Likely due to malignancy and dilution effect - No transfusional intervention required at this time - Monitor CBC with differential  4.  Diabetes - Elevated blood glucose levels - Likely due to steroids, on prednisone - Medicine managing with insulin sliding scale  Code Status Full  Subjective:  Patient seen awake and alert laying supine in bed.  Reports feeling okay.  Admits to slight headache and slight nausea yesterday.  Wonders if chemo will be given as scheduled or if will be delayed.  Still with intermittent fevers.  No other acute distress  noted.  Objective:  Vitals:   06/04/23 0432 06/04/23 0755  BP: (!) 144/66 (!) 143/68  Pulse: (!) 103 (!) 106  Resp: 18 16  Temp: 99.2 F (37.3 C) 99.1 F (37.3 C)  SpO2: 94% 94%     Intake/Output Summary (Last 24 hours) at 06/04/2023 0913 Last data filed at 06/04/2023 0426 Gross per 24 hour  Intake 560.4 ml  Output --  Net 560.4 ml     REVIEW OF SYSTEMS:   Constitutional: +fevers, chills or abnormal night sweats Eyes: Denies blurriness of vision, double vision or watery eyes Ears, nose, mouth, throat, and face: Denies mucositis or sore throat Respiratory: Denies cough, dyspnea or wheezes Cardiovascular: Denies palpitation, chest discomfort or lower extremity swelling Gastrointestinal:  +nausea, heartburn or change in bowel habits Skin: Denies abnormal skin rashes Lymphatics: Denies new lymphadenopathy or easy bruising Neurological: Denies numbness, tingling or new weaknesses Behavioral/Psych: Mood is stable, no new changes  All other systems were reviewed with the patient and are negative.  PHYSICAL EXAMINATION: ECOG PERFORMANCE STATUS: 1 - Symptomatic but completely ambulatory  Vitals:   06/04/23 0432 06/04/23 0755  BP: (!) 144/66 (!) 143/68  Pulse: (!) 103 (!) 106  Resp: 18 16  Temp: 99.2 F (37.3 C) 99.1 F (37.3 C)  SpO2: 94% 94%   There were no vitals filed for this visit.  GENERAL: alert, no distress and comfortable SKIN: skin color, texture, turgor are normal, no rashes or significant lesions EYES: normal, conjunctiva are pink and non-injected, sclera clear OROPHARYNX: no exudate, no erythema and lips, buccal mucosa, and tongue normal  NECK: supple, thyroid normal size, non-tender, without nodularity LYMPH:  no palpable lymphadenopathy in the cervical, axillary or inguinal LUNGS: clear to auscultation and percussion with normal breathing effort HEART: regular rate & rhythm and no murmurs and no lower extremity edema ABDOMEN: abdomen soft, non-tender and  normal bowel sounds MUSCULOSKELETAL: no cyanosis of digits and no clubbing  PSYCH: alert & oriented x 3 with fluent speech NEURO: no focal motor/sensory deficits   All questions were answered. The patient knows to call the clinic with any problems, questions or concerns.   The total time spent in the appointment was 40 minutes encounter with patient including review of chart and various tests results, discussions about plan of care and coordination of care plan  Dawson Bills, NP 06/04/2023 9:13 AM    Labs Reviewed:  Lab Results  Component Value Date   WBC 12.5 (H) 06/04/2023   HGB 10.2 (L) 06/04/2023   HCT 32.9 (L) 06/04/2023   MCV 91.1 06/04/2023   PLT 201 06/04/2023   Recent Labs    05/19/23 0949 05/26/23 1346 06/03/23 1050 06/04/23 0443  NA 139 137 140 136  K 3.8 4.2 3.7 3.8  CL 101 104 104 105  CO2 29 24 25  21*  GLUCOSE 120* 239* 169* 119*  BUN 20 25* 23 18  CREATININE 1.15 1.12 1.27* 0.96  CALCIUM 9.8 9.7 9.3 8.4*  GFRNONAA >60 >60 >60 >60  PROT 6.6 7.1 6.8  --   ALBUMIN 3.8 3.8 3.7  --   AST 14* 14* 13*  --   ALT 20 16 22   --   ALKPHOS 49 57 66  --   BILITOT 0.4 0.3 0.5  --     Studies Reviewed:  CT ABDOMEN PELVIS W CONTRAST Result Date: 06/04/2023 CLINICAL DATA:  History of recent small bowel resection with ongoing nausea, initial encounter EXAM: CT ABDOMEN AND PELVIS WITH CONTRAST TECHNIQUE: Multidetector CT imaging of the abdomen and pelvis was performed using the standard protocol following bolus administration of intravenous contrast. RADIATION DOSE REDUCTION: This exam was performed according to the departmental dose-optimization program which includes automated exposure control, adjustment of the mA and/or kV according to patient size and/or use of iterative reconstruction technique. CONTRAST:  OMNIPAQUE IOHEXOL 300 MG/ML  SOLN COMPARISON:  05/08/2023 FINDINGS: Lower chest: No acute abnormality. Hepatobiliary: No focal liver abnormality is seen. No  gallstones, gallbladder wall thickening, or biliary dilatation. Pancreas: Unremarkable. No pancreatic ductal dilatation or surrounding inflammatory changes. Spleen: Normal in size without focal abnormality. Adrenals/Urinary Tract: Adrenal glands are within normal limits bilaterally. Kidneys demonstrate a normal enhancement pattern. Bilateral renal cysts are noted. No follow-up is recommended. No obstructive changes are seen. The bladder is partially distended. Stomach/Bowel: No obstructive or inflammatory changes of the colon are noted. The appendix is within normal limits. Surgical changes are noted in the small bowel. No obstructive changes are seen. Stomach is within normal limits. Vascular/Lymphatic: Mild vascular calcifications are noted. Scattered small mesenteric lymph nodes are noted. A large centrally necrotic mass lesion is noted within the mesentery to the left of the midline similar to that seen on the prior exam. Air is noted within now consistent with increasing infection. The mass now measures approximately 11.5 x 9.1 cm. Reproductive: Prostate is unremarkable. Other: No abdominal wall hernia or abnormality. No abdominopelvic ascites. Musculoskeletal: No acute or significant osseous findings. IMPRESSION: Large centrally necrotic mass lesion is again identified within the mesentery. Increased air is noted within likely related to super infection. Previously seen surgical drain has been removed. No other focal  abnormality is noted. Electronically Signed   By: Alcide Clever M.D.   On: 06/04/2023 01:42   DG CHEST PORT 1 VIEW Result Date: 06/03/2023 CLINICAL DATA:  Neutropenic fever EXAM: PORTABLE CHEST 1 VIEW COMPARISON:  05/02/2023 PET-CT FINDINGS: Right chest wall port is noted in satisfactory position. Cardiac shadow is within normal limits. The lungs are well aerated without focal infiltrate. No bony abnormality is seen. IMPRESSION: No acute abnormality noted. Electronically Signed   By: Alcide Clever  M.D.   On: 06/03/2023 21:09   IR IMAGING GUIDED PORT INSERTION Result Date: 05/13/2023 INDICATION: port for chemo.  History of T-cell lymphoma. EXAM: IMPLANTED PORT A CATH PLACEMENT WITH ULTRASOUND AND FLUOROSCOPIC GUIDANCE MEDICATIONS: Ancef 2 gm IV; The antibiotic was administered within an appropriate time interval prior to skin puncture. ANESTHESIA/SEDATION: Moderate (conscious) sedation was employed during this procedure. A total of Versed 3.5 mg and Fentanyl 100 mcg was administered intravenously. Moderate Sedation Time: 16 minutes. The patient's level of consciousness and vital signs were monitored continuously by radiology nursing throughout the procedure under my direct supervision. FLUOROSCOPY TIME:  Fluoroscopic dose; 0 mGy COMPLICATIONS: None immediate. PROCEDURE: The procedure, risks, benefits, and alternatives were explained to the patient. Questions regarding the procedure were encouraged and answered. The patient understands and consents to the procedure. The RIGHT neck and chest were prepped with chlorhexidine in a sterile fashion, and a sterile drape was applied covering the operative field. Maximum barrier sterile technique with sterile gowns and gloves were used for the procedure. A timeout was performed prior to the initiation of the procedure. Local anesthesia was provided with 1% lidocaine with epinephrine. After creating a small venotomy incision, a micropuncture kit was utilized to access the internal jugular vein under direct, real-time ultrasound guidance. Ultrasound image documentation was performed. The microwire was kinked to measure appropriate catheter length. A subcutaneous port pocket was then created along the upper chest wall utilizing a combination of sharp and blunt dissection. The pocket was irrigated with sterile saline. A single lumen power injectable port was chosen for placement. The 8 Fr catheter was tunneled from the port pocket site to the venotomy incision. The port  was placed in the pocket. The external catheter was trimmed to appropriate length. At the venotomy, an 8 Fr peel-away sheath was placed over a guidewire under fluoroscopic guidance. The catheter was then placed through the sheath and the sheath was removed. Final catheter positioning was confirmed and documented with a fluoroscopic spot radiograph. The port was accessed with a Huber needle, aspirated and flushed with heparinized saline. The port pocket incision was closed with interrupted 3-0 Vicryl suture then Dermabond was applied, including at the venotomy incision. Dressings were placed. The patient tolerated the procedure well without immediate post procedural complication. IMPRESSION: Successful placement of a RIGHT internal jugular approach power injectable Port-A-Cath. The tip of the catheter is positioned within the proximal RIGHT atrium. The catheter is ready for immediate use. Roanna Banning, MD [facility] Rad[facility] Electronically Signed   By: Roanna Banning M.D.   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 * Tracking Code: BO * EXAM: CT ABDOMEN AND PELVIS WITH CONTRAST TECHNIQUE: Multidetector CT imaging of the abdomen and pelvis was performed using the standard protocol following bolus administration of intravenous contrast. RADIATION DOSE REDUCTION: This exam was performed according to the departmental dose-optimization program which includes automated exposure control,  adjustment of the mA and/or kV according to patient size and/or use of iterative reconstruction technique. CONTRAST:  OMNIPAQUE IOHEXOL 300 MG/ML  SOLN COMPARISON:  PET-CT on 05/02/2023 FINDINGS: Lower Chest: No acute findings. Hepatobiliary:  No suspicious hepatic masses identified. Pancreas:  No mass or inflammatory changes. Spleen: Within normal limits in size and appearance.  Adrenals/Urinary Tract: No suspicious masses identified. No evidence of ureteral calculi or hydronephrosis. Stomach/Bowel: Large poorly defined mass with central necrosis is again seen in the left lower quadrant mesentery which shows involvement of small bowel loops. This measures 11.8 x 7.6 cm on image 73/2, compared to 10.7 x 7.4 cm at same level on previous study. Small adjacent mesenteric soft tissue nodules or lymph nodes are again seen, some showing mild increase in size since previous study. Index lesion measures 2.0 x 1.7 cm on image 71/2 compared to 1.7 x 1.2 cm on prior exam. Surgical drain remains in place. No No evidence of bowel obstruction. Vascular/Lymphatic: Mild increase in small mesenteric soft tissue nodules or lymph nodes, as described above. No acute vascular findings. Reproductive:  Stable mildly enlarged prostate. Other:  None. Musculoskeletal:  No suspicious bone lesions identified. IMPRESSION: Mild increase in size of large necrotic mass in left lower quadrant mesentery, which involves small bowel. Mild increase in size of adjacent small mesenteric soft tissue nodules or lymph nodes. Electronically Signed   By: Danae Orleans M.D.   On: 05/08/2023 14:39   ECHOCARDIOGRAM COMPLETE Result Date: 05/06/2023    ECHOCARDIOGRAM REPORT   Patient Name:   Elijah [name] Date of Exam: 05/06/2023 Medical Rec #:  [id number]        Height:       73.0 in Accession #:    [id number]       Weight:       236.5 lb Date of Birth:  01-20-1956         BSA:          2.311 m Patient Age:    67 years         BP:           155/81 mmHg Patient Gender: M                HR:           84 bpm. Exam Location:  Outpatient Procedure: 2D Echo, Pediatric Echo and Cardiac Doppler Indications:    Z51.11 Encounter for antineoplastic chemotheraphy  History:        Patient has no prior history of Echocardiogram examinations.                 Risk Factors:Sleep Apnea and Non-Smoker. Patient denies chest                 pain, SOB and leg  edema. He currently has lymphoma and                 recovering from small bowel surgery 04/10/2023. Pre-chemotherapy                 schocardiogram needed.  Sonographer:    Carlos American RVT, RDCS (AE), RDMS Referring Phys: [id number] Briant Cedar  Sonographer Comments: Suboptimal apical window. Image acquisition challenging due to patient body habitus. Global longitudinal strain was attempted. IMPRESSIONS  1. Left ventricular ejection fraction, by estimation, is 65 to 70%. The left ventricle has normal function. The left ventricle has no regional wall motion abnormalities. There is mild left ventricular hypertrophy.  Left ventricular diastolic parameters are consistent with Grade I diastolic dysfunction (impaired relaxation).  2. Right ventricular systolic function is normal. The right ventricular size is normal. Tricuspid regurgitation signal is inadequate for assessing PA pressure.  3. The mitral valve is degenerative. Trivial mitral valve regurgitation. No evidence of mitral stenosis.  4. The aortic valve is grossly normal. There is mild calcification of the aortic valve. Aortic valve regurgitation is not visualized. No aortic stenosis is present.  5. The inferior vena cava is normal in size with greater than 50% respiratory variability, suggesting right atrial pressure of 3 mmHg. FINDINGS  Left Ventricle: Left ventricular ejection fraction, by estimation, is 65 to 70%. The left ventricle has normal function. The left ventricle has no regional wall motion abnormalities. Global longitudinal strain performed but not reported based on interpreter judgement due to suboptimal tracking. The left ventricular internal cavity size was normal in size. There is mild left ventricular hypertrophy. Left ventricular diastolic parameters are consistent with Grade I diastolic dysfunction (impaired relaxation). Right Ventricle: The right ventricular size is normal. No increase in right ventricular wall thickness. Right ventricular  systolic function is normal. Tricuspid regurgitation signal is inadequate for assessing PA pressure. Left Atrium: Left atrial size was normal in size. Right Atrium: Right atrial size was normal in size. Pericardium: There is no evidence of pericardial effusion. Mitral Valve: The mitral valve is degenerative in appearance. There is mild thickening of the mitral valve leaflet(s). Trivial mitral valve regurgitation. No evidence of mitral valve stenosis. Tricuspid Valve: The tricuspid valve is normal in structure. Tricuspid valve regurgitation is trivial. No evidence of tricuspid stenosis. Aortic Valve: The aortic valve is grossly normal. There is mild calcification of the aortic valve. Aortic valve regurgitation is not visualized. No aortic stenosis is present. Aortic valve mean gradient measures 3.5 mmHg. Aortic valve peak gradient measures 6.1 mmHg. Aortic valve area, by VTI measures 2.52 cm. Pulmonic Valve: The pulmonic valve was normal in structure. Pulmonic valve regurgitation is trivial. No evidence of pulmonic stenosis. Aorta: The aortic root is normal in size and structure. Ascending aorta measurements are within normal limits for age when indexed to body surface area. Venous: The inferior vena cava is normal in size with greater than 50% respiratory variability, suggesting right atrial pressure of 3 mmHg. IAS/Shunts: No atrial level shunt detected by color flow Doppler.  LEFT VENTRICLE PLAX 2D LVIDd:         3.66 cm   Diastology LVIDs:         2.27 cm   LV e' medial:    7.14 cm/s LV PW:         1.13 cm   LV E/e' medial:  6.1 LV IVS:        1.16 cm   LV e' lateral:   10.10 cm/s LVOT diam:     1.87 cm   LV E/e' lateral: 4.3 LV SV:         44 LV SV Index:   19 LVOT Area:     2.75 cm  RIGHT VENTRICLE RV S prime:     16.10 cm/s TAPSE (M-mode): 3.1 cm LEFT ATRIUM             Index        RIGHT ATRIUM           Index LA diam:        3.69 cm 1.60 cm/m   RA Area:     13.30 cm LA Vol (A2C):  68.8 ml 29.78 ml/m  RA  Volume:   25.70 ml  11.12 ml/m LA Vol (A4C):   49.1 ml 21.25 ml/m LA Biplane Vol: 57.5 ml 24.89 ml/m  AORTIC VALVE                    PULMONIC VALVE AV Area (Vmax):    2.11 cm     PV Vmax:       1.01 m/s AV Area (Vmean):   2.17 cm     PV Peak grad:  4.1 mmHg AV Area (VTI):     2.52 cm AV Vmax:           123.50 cm/s AV Vmean:          82.100 cm/s AV VTI:            0.174 m AV Peak Grad:      6.1 mmHg AV Mean Grad:      3.5 mmHg LVOT Vmax:         95.00 cm/s LVOT Vmean:        64.900 cm/s LVOT VTI:          0.160 m LVOT/AV VTI ratio: 0.92  AORTA Ao Root diam: 3.56 cm Ao Asc diam:  3.68 cm Ao Arch diam: 3.2 cm MITRAL VALVE MV Area (PHT): 3.46 cm    SHUNTS MV Decel Time: 219 msec    Systemic VTI:  0.16 m MV E velocity: 43.50 cm/s  Systemic Diam: 1.87 cm MV A velocity: 57.60 cm/s MV E/A ratio:  0.76 Weston Brass MD Electronically signed by Weston Brass MD Signature Date/Time: 05/06/2023/9:52:34 PM    Final

## 2023-06-05 ENCOUNTER — Encounter: Payer: Self-pay | Admitting: Hematology and Oncology

## 2023-06-05 ENCOUNTER — Inpatient Hospital Stay (HOSPITAL_COMMUNITY)

## 2023-06-05 ENCOUNTER — Encounter (HOSPITAL_COMMUNITY): Payer: Self-pay | Admitting: Family Medicine

## 2023-06-05 DIAGNOSIS — D709 Neutropenia, unspecified: Secondary | ICD-10-CM | POA: Diagnosis not present

## 2023-06-05 DIAGNOSIS — R5081 Fever presenting with conditions classified elsewhere: Secondary | ICD-10-CM | POA: Diagnosis not present

## 2023-06-05 LAB — PHOSPHORUS: Phosphorus: 3.2 mg/dL (ref 2.5–4.6)

## 2023-06-05 LAB — CBC WITH DIFFERENTIAL/PLATELET
Abs Immature Granulocytes: 3.31 10*3/uL — ABNORMAL HIGH (ref 0.00–0.07)
Basophils Absolute: 0 10*3/uL (ref 0.0–0.1)
Basophils Relative: 0 %
Eosinophils Absolute: 0.1 10*3/uL (ref 0.0–0.5)
Eosinophils Relative: 1 %
HCT: 31.3 % — ABNORMAL LOW (ref 39.0–52.0)
Hemoglobin: 9.6 g/dL — ABNORMAL LOW (ref 13.0–17.0)
Immature Granulocytes: 18 %
Lymphocytes Relative: 4 %
Lymphs Abs: 0.8 10*3/uL (ref 0.7–4.0)
MCH: 27.7 pg (ref 26.0–34.0)
MCHC: 30.7 g/dL (ref 30.0–36.0)
MCV: 90.5 fL (ref 80.0–100.0)
Monocytes Absolute: 2 10*3/uL — ABNORMAL HIGH (ref 0.1–1.0)
Monocytes Relative: 11 %
Neutro Abs: 12.4 10*3/uL — ABNORMAL HIGH (ref 1.7–7.7)
Neutrophils Relative %: 66 %
Platelets: 189 10*3/uL (ref 150–400)
RBC: 3.46 MIL/uL — ABNORMAL LOW (ref 4.22–5.81)
RDW: 15.1 % (ref 11.5–15.5)
WBC: 18.6 10*3/uL — ABNORMAL HIGH (ref 4.0–10.5)
nRBC: 0.1 % (ref 0.0–0.2)

## 2023-06-05 LAB — MAGNESIUM: Magnesium: 2.1 mg/dL (ref 1.7–2.4)

## 2023-06-05 LAB — GLUCOSE, CAPILLARY
Glucose-Capillary: 166 mg/dL — ABNORMAL HIGH (ref 70–99)
Glucose-Capillary: 132 mg/dL — ABNORMAL HIGH (ref 70–99)
Glucose-Capillary: 157 mg/dL — ABNORMAL HIGH (ref 70–99)
Glucose-Capillary: 143 mg/dL — ABNORMAL HIGH (ref 70–99)
Glucose-Capillary: 145 mg/dL — ABNORMAL HIGH (ref 70–99)

## 2023-06-05 LAB — BASIC METABOLIC PANEL
Anion gap: 11 (ref 5–15)
BUN: 14 mg/dL (ref 8–23)
CO2: 23 mmol/L (ref 22–32)
Calcium: 8.6 mg/dL — ABNORMAL LOW (ref 8.9–10.3)
Chloride: 106 mmol/L (ref 98–111)
Creatinine, Ser: 0.87 mg/dL (ref 0.61–1.24)
GFR, Estimated: >60 mL/min (ref >60)
Glucose, Bld: 139 mg/dL — ABNORMAL HIGH (ref 70–99)
Potassium: 3.2 mmol/L — ABNORMAL LOW (ref 3.5–5.1)
Sodium: 140 mmol/L (ref 135–145)

## 2023-06-05 LAB — PROTIME-INR
INR: 1.1 (ref 0.8–1.2)
Prothrombin Time: 14 s (ref 11.4–15.2)

## 2023-06-05 MED ORDER — SODIUM CHLORIDE 0.9 % IV SOLN
INTRAVENOUS | Status: AC
  Filled 2023-06-05: qty 250

## 2023-06-05 MED ORDER — SODIUM CHLORIDE 0.9 % IV SOLN
2.0000 g | INTRAVENOUS | Status: DC
  Administered 2023-06-05 – 2023-06-07 (×3): 2 g via INTRAVENOUS
  Filled 2023-06-05 (×3): qty 20

## 2023-06-05 MED ORDER — FENTANYL CITRATE (PF) 100 MCG/2ML IJ SOLN
INTRAMUSCULAR | Status: AC | PRN
  Administered 2023-06-05: 50 ug via INTRAVENOUS

## 2023-06-05 MED ORDER — MIDAZOLAM HCL 2 MG/2ML IJ SOLN
INTRAMUSCULAR | Status: AC | PRN
  Administered 2023-06-05: 1 mg via INTRAVENOUS

## 2023-06-05 MED ORDER — SODIUM CHLORIDE 0.9% FLUSH
5.0000 mL | Freq: Three times a day (TID) | INTRAVENOUS | Status: DC
  Administered 2023-06-05 – 2023-06-07 (×5): 5 mL

## 2023-06-05 MED ORDER — FLUMAZENIL 0.5 MG/5ML IV SOLN
INTRAVENOUS | Status: AC
  Filled 2023-06-05: qty 5

## 2023-06-05 MED ORDER — ENOXAPARIN SODIUM 40 MG/0.4ML IJ SOSY: 40.0000 mg | PREFILLED_SYRINGE | INTRAMUSCULAR | Status: DC

## 2023-06-05 MED ORDER — POTASSIUM CHLORIDE CRYS ER 20 MEQ PO TBCR
40.0000 meq | EXTENDED_RELEASE_TABLET | ORAL | Status: AC
  Administered 2023-06-05 (×2): 40 meq via ORAL
  Filled 2023-06-05 (×2): qty 2

## 2023-06-05 MED ORDER — MIDAZOLAM HCL 2 MG/2ML IJ SOLN
INTRAMUSCULAR | Status: AC
  Filled 2023-06-05: qty 4

## 2023-06-05 MED ORDER — DIPHENHYDRAMINE HCL 50 MG/ML IJ SOLN
INTRAMUSCULAR | Status: AC | PRN
  Administered 2023-06-05: 25 mg via INTRAVENOUS

## 2023-06-05 MED ORDER — DIPHENHYDRAMINE HCL 50 MG/ML IJ SOLN
INTRAMUSCULAR | Status: AC
  Filled 2023-06-05: qty 1

## 2023-06-05 MED ORDER — FENTANYL CITRATE (PF) 100 MCG/2ML IJ SOLN
INTRAMUSCULAR | Status: AC
  Filled 2023-06-05: qty 4

## 2023-06-05 MED ORDER — NALOXONE HCL 0.4 MG/ML IJ SOLN
INTRAMUSCULAR | Status: AC
  Filled 2023-06-05: qty 1

## 2023-06-05 MED ORDER — MIDAZOLAM HCL 2 MG/2ML IJ SOLN
INTRAMUSCULAR | Status: AC | PRN
Start: 2023-06-05 — End: 2023-06-05
  Administered 2023-06-05: 1 mg via INTRAVENOUS

## 2023-06-05 NOTE — Procedures (Signed)
 Vascular and Interventional Radiology Procedure Note  Patient: Elijah [name] DOB: [birth year] Medical Record Number: [id number] Note Date/Time: 06/05/23 4:02 PM   Performing Physician: Roanna Banning, MD Assistant(s): None  Diagnosis: LLQ abscess  Procedure: DRAINAGE CATHETER PLACEMENT into a LEFT LOWER QUADRANT ABSCESS  Anesthesia: Conscious Sedation Complications: None Estimated Blood Loss: Minimal Specimens: Sent for Gram Stain, Aerobe Culture, and Anerobe Culture  Findings:  Successful CT-guided placement of 12 F catheter into LLQ abscess.  Plan:  - Flush drain with 5 mL Normal Saline every 8 hours. - Follow up drain evaluation / sinogram in 10 day(s).  See detailed procedure note with images in PACS. The patient tolerated the procedure well without incident or complication and was returned to Recovery in stable condition.    Roanna Banning, MD [facility] Radiology   Pager. [phone] Clinic. [phone]

## 2023-06-05 NOTE — Plan of Care (Signed)
 Problem: Education: Goal: Knowledge of General Education information will improve Description: Including pain rating scale, medication(s)/side effects and non-pharmacologic comfort measures 06/05/2023 0551 by Elijah Ewings, RN Outcome: Progressing 06/05/2023 0551 by Elijah Ewings, RN Outcome: Progressing   Problem: Health Behavior/Discharge Planning: Goal: Ability to manage health-related needs will improve 06/05/2023 0551 by Elijah Ewings, RN Outcome: Progressing 06/05/2023 0551 by Elijah Ewings, RN Outcome: Progressing   Problem: Clinical Measurements: Goal: Ability to maintain clinical measurements within normal limits will improve 06/05/2023 0551 by Elijah Ewings, RN Outcome: Progressing 06/05/2023 0551 by Elijah Ewings, RN Outcome: Progressing Goal: Will remain free from infection 06/05/2023 0551 by Elijah Ewings, RN Outcome: Progressing 06/05/2023 0551 by Elijah Ewings, RN Outcome: Progressing Goal: Diagnostic test results will improve 06/05/2023 0551 by Elijah Ewings, RN Outcome: Progressing 06/05/2023 0551 by Elijah Ewings, RN Outcome: Progressing Goal: Respiratory complications will improve 06/05/2023 0551 by Elijah Ewings, RN Outcome: Progressing 06/05/2023 0551 by Elijah Ewings, RN Outcome: Progressing Goal: Cardiovascular complication will be avoided 06/05/2023 0551 by Elijah Ewings, RN Outcome: Progressing 06/05/2023 0551 by Elijah Ewings, RN Outcome: Progressing   Problem: Activity: Goal: Risk for activity intolerance will decrease 06/05/2023 0551 by Elijah Ewings, RN Outcome: Progressing 06/05/2023 0551 by Elijah Ewings, RN Outcome: Progressing   Problem: Nutrition: Goal: Adequate nutrition will be maintained 06/05/2023 0551 by Elijah Ewings, RN Outcome: Progressing 06/05/2023 0551 by Elijah Ewings, RN Outcome: Progressing   Problem: Coping: Goal: Level of anxiety will decrease 06/05/2023 0551 by Elijah Ewings, RN Outcome:  Progressing 06/05/2023 0551 by Elijah Ewings, RN Outcome: Progressing   Problem: Elimination: Goal: Will not experience complications related to bowel motility 06/05/2023 0551 by Elijah Ewings, RN Outcome: Progressing 06/05/2023 0551 by Elijah Ewings, RN Outcome: Progressing Goal: Will not experience complications related to urinary retention 06/05/2023 0551 by Elijah Ewings, RN Outcome: Progressing 06/05/2023 0551 by Elijah Ewings, RN Outcome: Progressing   Problem: Pain Managment: Goal: General experience of comfort will improve and/or be controlled 06/05/2023 0551 by Elijah Ewings, RN Outcome: Progressing 06/05/2023 0551 by Elijah Ewings, RN Outcome: Progressing   Problem: Safety: Goal: Ability to remain free from injury will improve 06/05/2023 0551 by Elijah Ewings, RN Outcome: Progressing 06/05/2023 0551 by Elijah Ewings, RN Outcome: Progressing   Problem: Skin Integrity: Goal: Risk for impaired skin integrity will decrease 06/05/2023 0551 by Elijah Ewings, RN Outcome: Progressing 06/05/2023 0551 by Elijah Ewings, RN Outcome: Progressing   Problem: Education: Goal: Ability to describe self-care measures that may prevent or decrease complications (Diabetes Survival Skills Education) will improve 06/05/2023 0551 by Elijah Ewings, RN Outcome: Progressing 06/05/2023 0551 by Elijah Ewings, RN Outcome: Progressing Goal: Individualized Educational Video(s) 06/05/2023 0551 by Elijah Ewings, RN Outcome: Progressing 06/05/2023 0551 by Elijah Ewings, RN Outcome: Progressing   Problem: Coping: Goal: Ability to adjust to condition or change in health will improve 06/05/2023 0551 by Elijah Ewings, RN Outcome: Progressing 06/05/2023 0551 by Elijah Ewings, RN Outcome: Progressing   Problem: Fluid Volume: Goal: Ability to maintain a balanced intake and output will improve 06/05/2023 0551 by Elijah Ewings, RN Outcome: Progressing 06/05/2023 0551 by  Elijah Ewings, RN Outcome: Progressing   Problem: Health Behavior/Discharge Planning: Goal: Ability to identify and utilize available resources and services will improve 06/05/2023 0551 by Elijah Ewings, RN Outcome: Progressing 06/05/2023 0551 by  Aynslee Mulhall L, RN Outcome: Progressing Goal: Ability to manage health-related needs will improve 06/05/2023 0551 by Elijah Ewings, RN Outcome: Progressing 06/05/2023 0551 by Elijah Ewings, RN Outcome: Progressing   Problem: Metabolic: Goal: Ability to maintain appropriate glucose levels will improve 06/05/2023 0551 by Elijah Ewings, RN Outcome: Progressing 06/05/2023 0551 by Elijah Ewings, RN Outcome: Progressing   Problem: Nutritional: Goal: Maintenance of adequate nutrition will improve 06/05/2023 0551 by Elijah Ewings, RN Outcome: Progressing 06/05/2023 0551 by Elijah Ewings, RN Outcome: Progressing Goal: Progress toward achieving an optimal weight will improve 06/05/2023 0551 by Elijah Ewings, RN Outcome: Progressing 06/05/2023 0551 by Elijah Ewings, RN Outcome: Progressing   Problem: Skin Integrity: Goal: Risk for impaired skin integrity will decrease 06/05/2023 0551 by Elijah Ewings, RN Outcome: Progressing 06/05/2023 0551 by Elijah Ewings, RN Outcome: Progressing   Problem: Tissue Perfusion: Goal: Adequacy of tissue perfusion will improve 06/05/2023 0551 by Elijah Ewings, RN Outcome: Progressing 06/05/2023 0551 by Elijah Ewings, RN Outcome: Progressing   Problem: Education: Goal: Ability to describe self-care measures that may prevent or decrease complications (Diabetes Survival Skills Education) will improve 06/05/2023 0551 by Elijah Ewings, RN Outcome: Progressing 06/05/2023 0551 by Elijah Ewings, RN Outcome: Progressing Goal: Individualized Educational Video(s) 06/05/2023 0551 by Elijah Ewings, RN Outcome: Progressing 06/05/2023 0551 by Elijah Ewings, RN Outcome: Progressing    Problem: Coping: Goal: Ability to adjust to condition or change in health will improve 06/05/2023 0551 by Elijah Ewings, RN Outcome: Progressing 06/05/2023 0551 by Elijah Ewings, RN Outcome: Progressing   Problem: Fluid Volume: Goal: Ability to maintain a balanced intake and output will improve 06/05/2023 0551 by Elijah Ewings, RN Outcome: Progressing 06/05/2023 0551 by Elijah Ewings, RN Outcome: Progressing   Problem: Health Behavior/Discharge Planning: Goal: Ability to identify and utilize available resources and services will improve 06/05/2023 0551 by Elijah Ewings, RN Outcome: Progressing 06/05/2023 0551 by Elijah Ewings, RN Outcome: Progressing Goal: Ability to manage health-related needs will improve 06/05/2023 0551 by Elijah Ewings, RN Outcome: Progressing 06/05/2023 0551 by Elijah Ewings, RN Outcome: Progressing   Problem: Metabolic: Goal: Ability to maintain appropriate glucose levels will improve 06/05/2023 0551 by Elijah Ewings, RN Outcome: Progressing 06/05/2023 0551 by Elijah Ewings, RN Outcome: Progressing   Problem: Nutritional: Goal: Maintenance of adequate nutrition will improve 06/05/2023 0551 by Elijah Ewings, RN Outcome: Progressing 06/05/2023 0551 by Elijah Ewings, RN Outcome: Progressing Goal: Progress toward achieving an optimal weight will improve 06/05/2023 0551 by Elijah Ewings, RN Outcome: Progressing 06/05/2023 0551 by Elijah Ewings, RN Outcome: Progressing   Problem: Skin Integrity: Goal: Risk for impaired skin integrity will decrease 06/05/2023 0551 by Elijah Ewings, RN Outcome: Progressing 06/05/2023 0551 by Elijah Ewings, RN Outcome: Progressing   Problem: Tissue Perfusion: Goal: Adequacy of tissue perfusion will improve 06/05/2023 0551 by Elijah Ewings, RN Outcome: Progressing 06/05/2023 0551 by Elijah Ewings, RN Outcome: Progressing

## 2023-06-05 NOTE — Consult Note (Addendum)
 WOC Nurse Consult Note: Reason for Consult:  change NPWT dressing midline, patient known to WOC nursing team from previous admission  Wound type:surgical; superficial, no depth; 100% granulation Pressure Injury POA: NA Measurement: 10cm x 0.3cm x 0.1cm  Wound bed: clean, pink, moist Drainage (amount, consistency, odor) none Periwound: intact  Dressing procedure/placement/frequency: DC VAC; applied dry dressing today, ordered hydrogel. Elijah Bridge PA at bedside to support decision to DC NPWT dressing. Patient and wife agree with POC, discussed with bedside nurse.  Daily: cleanse with saline, pat dry, apply hydrogel [facility] [place] # [phone]) to the wound bed, top with dry dressing   OK for bedside nursing to change dressing.   Elijah [name] [facility], CNS, [employer] [phone]

## 2023-06-05 NOTE — Progress Notes (Signed)
 PROGRESS NOTE    Elijah [name]  YNW:[id number] DOB: 1955/05/30 DOA: 06/03/2023 PCP: Tally Joe, MD    Brief Narrative:  68 year old with history of recently diagnosed T-cell lymphoma started chemo last week, GERD, BPH, PMNR, HLD, prediabetes, OSA presented from the cancer clinic for dizziness.  Patient was found to be febrile, hypotensive and tachycardic.  Initially broad-spectrum antibiotics were started and admitted for SIRS  He was admitted in January for perforated small bowel and was diagnosed with lymphoma requiring small bowel resection.   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  First round of chemo 2/24. -This patient does not have neutropenia -COVID/flu/RSV-negative - Respiratory panel still pending.  Cultures from the clinic are negative, UA is negative.  Chest x-ray is negative - CT abdomen pelvis shows large previously noted mesenteric necrotic lesion with some evidence of air with possible superimposed infection.  Discussed with general surgery.  Due to rising WBC, plans for IR drain placement.  - Change antibiotics to Rocephin and Flagyl.  Leukocytosis possibly related to prednisone vs infection.   Hypokalemia - As needed repletion    AKI (acute kidney injury) (HCC), resolved -Admission creatinine 1.2, now at baseline 0.9   Steroid-induced diabetes (HCC) On chronic prednisone.  Sliding scale and Accu-Cheks   PMR (polymyalgia rheumatica) (HCC) Continue prednisone 15mg  daily (tapering down)    Peripheral T-cell lymphoma of solid  organ excluding spleen [facility]) Follow-up outpatient oncology, Dr. Leonides Schanz   Gastroesophageal reflux disease without esophagitis Continue protonix daily    BPH (benign prostatic hyperplasia) Continue finasteride and alfuzosin    Obstructive sleep apnea Cpap nightly, does not want while inpatient    DVT prophylaxis: SCDs Start: 06/03/23 1755    Code Status: Full Code Family Communication: Spouse at bedside Status is: Inpatient Remains inpatient appropriate because: Hopefully home in next 24 hours if WBC stabilizes or trends down  Subjective: Feels ok Remains afebrile.   Examination:  General exam: Appears calm and comfortable  Respiratory system: Clear to auscultation. Respiratory effort normal. Cardiovascular system: S1 & S2 heard, RRR. No JVD, murmurs, rubs, gallops or clicks. No pedal edema. Gastrointestinal system: Abdomen is nondistended, soft and nontender. No organomegaly or masses felt. Normal bowel sounds heard. Central nervous system: Alert and oriented. No focal neurological deficits. Extremities: Symmetric 5 x 5 power. Skin: No rashes, lesions or ulcers Psychiatry: Judgement and insight appear normal. Mood & affect appropriate.                Diet Orders (From admission, onward)     Start     Ordered   06/05/23 0806  Diet NPO time specified Except for: Sips with Meds  Diet effective now       Question:  Except for  Answer:  Sips with Meds   06/05/23 0805            Objective: Vitals:   06/04/23 1159 06/04/23 1307 06/04/23 2157 06/05/23 0558  BP: 130/70 137/82 (!) 148/73 (!) 140/75  Pulse: (!) 102 (!) 101 93 92  Resp: 16 20 16 16   Temp: 98 F (36.7 C) 98.5 F (  36.9 C) 98.7 F (37.1 C) 98.3 F (36.8 C)  TempSrc: Oral Oral Oral Oral  SpO2: 94% 94% 96% 95%  Weight:      Height:        Intake/Output Summary (Last 24 hours) at 06/05/2023 1106 Last data filed at 06/05/2023 0346 Gross per 24 hour  Intake 504.37 ml  Output --  Net 504.37 ml    Filed Weights   06/04/23 1102  Weight: 102.9 kg    Scheduled Meds:  alfuzosin  10 mg Oral Daily   allopurinol  300 mg Oral Daily   finasteride  5 mg Oral Daily   insulin aspart  0-9 Units Subcutaneous TID WC   loratadine  10 mg Oral Daily   pantoprazole  40 mg Oral Daily   potassium chloride  40 mEq Oral Q4H   predniSONE  15 mg Oral Daily   Continuous Infusions:  cefTRIAXone (ROCEPHIN)  IV 2 g (06/05/23 1104)   metronidazole 500 mg (06/05/23 0942)    Nutritional status     Body mass index is 29.93 kg/m.  Data Reviewed:   CBC: Recent Labs  Lab 06/03/23 1050 06/04/23 0443 06/05/23 0510  WBC 4.6 12.5* 18.6*  NEUTROABS 1.5* 8.4* 12.4*  HGB 11.8* 10.2* 9.6*  HCT 36.6* 32.9* 31.3*  MCV 86.3 91.1 90.5  PLT 250 201 189   Basic Metabolic Panel: Recent Labs  Lab 06/03/23 1050 06/04/23 0443 06/05/23 0510  NA 140 136 140  K 3.7 3.8 3.2*  CL 104 105 106  CO2 25 21* 23  GLUCOSE 169* 119* 139*  BUN 23 18 14   CREATININE 1.27* 0.96 0.87  CALCIUM 9.3 8.4* 8.6*  MG  --   --  2.1  PHOS  --   --  3.2   GFR: Estimated Creatinine Clearance: 105.3 mL/min (by C-G formula based on SCr of 0.87 mg/dL). Liver Function Tests: Recent Labs  Lab 06/03/23 1050  AST 13*  ALT 22  ALKPHOS 66  BILITOT 0.5  PROT 6.8  ALBUMIN 3.7   No results for input(s): "LIPASE", "AMYLASE" in the last 168 hours. No results for input(s): "AMMONIA" in the last 168 hours. Coagulation Profile: No results for input(s): "INR", "PROTIME" in the last 168 hours. Cardiac Enzymes: No results for input(s): "CKTOTAL", "CKMB", "CKMBINDEX", "TROPONINI" in the last 168 hours. BNP (last 3 results) No results for input(s): "PROBNP" in the last 8760 hours. HbA1C: No results for input(s): "HGBA1C" in the last 72 hours. CBG: Recent Labs  Lab 06/04/23 0804 06/04/23 1107 06/04/23 1701 06/04/23 2158 06/05/23 0738  GLUCAP 126* 139* 177* 166* 132*   Lipid Profile: No results for input(s): "CHOL",  "HDL", "LDLCALC", "TRIG", "CHOLHDL", "LDLDIRECT" in the last 72 hours. Thyroid Function Tests: No results for input(s): "TSH", "T4TOTAL", "FREET4", "T3FREE", "THYROIDAB" in the last 72 hours. Anemia Panel: No results for input(s): "VITAMINB12", "FOLATE", "FERRITIN", "TIBC", "IRON", "RETICCTPCT" in the last 72 hours. Sepsis Labs: Recent Labs  Lab 06/03/23 1928 06/03/23 2302  LATICACIDVEN 3.0* 2.1*    Recent Results (from the past 240 hours)  Culture, Urine     Status: Abnormal   Collection Time: 06/03/23 11:08 AM   Specimen: Urine, Clean Catch  Result Value Ref Range Status   Specimen Description   Final    URINE, CLEAN CATCH Performed at [facility], [address] [place], [place] [address]    Special Requests   Final    NONE Performed at [facility], [address] [place], [place] [address]    Culture (A)  Final    <10,000 COLONIES/mL INSIGNIFICANT GROWTH Performed at [facility], [address] [place], [place] [address]    Report Status 06/04/2023 FINAL  Final  Resp panel by RT-PCR (RSV, Flu A&B, Covid) Anterior Nasal Swab     Status: None   Collection Time: 06/03/23 11:51 AM   Specimen: Anterior Nasal Swab  Result Value Ref Range Status   SARS Coronavirus 2 by RT PCR NEGATIVE NEGATIVE Final   Influenza A by PCR NEGATIVE NEGATIVE Final   Influenza B by PCR NEGATIVE NEGATIVE Final    Comment: (NOTE) The Xpert Xpress SARS-CoV-2/FLU/RSV plus assay is intended as an aid in the diagnosis of influenza from Nasopharyngeal swab specimens and should not be used as a sole basis for treatment. Nasal washings and aspirates are unacceptable for Xpert Xpress SARS-CoV-2/FLU/RSV testing.  Fact Sheet for Patients: BloggerCourse.com  Fact Sheet for Healthcare Providers: SeriousBroker.it  This test is not yet approved or cleared by the [place] FDA and has been  authorized for detection and/or diagnosis of SARS-CoV-2 by FDA under an Emergency Use Authorization (EUA). This EUA will remain in effect (meaning this test can be used) for the duration of the COVID-19 declaration under Section 564(b)(1) of the Act, 21 U.S.C. section 360bbb-3(b)(1), unless the authorization is terminated or revoked.     Resp Syncytial Virus by PCR NEGATIVE NEGATIVE Final    Comment: (NOTE) Fact Sheet for Patients: BloggerCourse.com  Fact Sheet for Healthcare Providers: SeriousBroker.it  This test is not yet approved or cleared by the [place] FDA and has been authorized for detection and/or diagnosis of SARS-CoV-2 by FDA under an Emergency Use Authorization (EUA). This EUA will remain in effect (meaning this test can be used) for the duration of the COVID-19 declaration under Section 564(b)(1) of the Act, 21 U.S.C. section 360bbb-3(b)(1), unless the authorization is terminated or revoked.  Performed at [facility], [address] [place], [place] [address]   Culture, blood (routine x 2)     Status: None (Preliminary result)   Collection Time: 06/03/23 12:11 PM   Specimen: BLOOD  Result Value Ref Range Status   Specimen Description   Final    BLOOD RIGHT ANTECUBITAL Performed at [facility], [address], [place], [place] [address]    Special Requests   Final    NONE Performed at [facility], [address] [place], [place] [address]    Culture   Final    NO GROWTH < 24 HOURS Performed at [facility], [address], [place], [place] [address]    Report Status PENDING  Incomplete  Culture, blood (routine x 2)     Status: None (Preliminary result)   Collection Time: 06/03/23 12:26 PM   Specimen: BLOOD  Result Value Ref Range Status   Specimen Description   Final    BLOOD RIGHT ANTECUBITAL Performed at [facility], [address],  [place], [place] [address]    Special Requests   Final    RIGHT ANTECUBITAL Performed at [facility], [address], [place], [place] [address]    Culture   Final    NO GROWTH < 24 HOURS Performed at [facility], [address], [place], [place] [address]    Report Status PENDING  Incomplete         Radiology Studies: CT ABDOMEN PELVIS  W CONTRAST Result Date: 06/04/2023 CLINICAL DATA:  History of recent small bowel resection with ongoing nausea, initial encounter EXAM: CT ABDOMEN AND PELVIS WITH CONTRAST TECHNIQUE: Multidetector CT imaging of the abdomen and pelvis was performed using the standard protocol following bolus administration of intravenous contrast. RADIATION DOSE REDUCTION: This exam was performed according to the departmental dose-optimization program which includes automated exposure control, adjustment of the mA and/or kV according to patient size and/or use of iterative reconstruction technique. CONTRAST:  OMNIPAQUE IOHEXOL 300 MG/ML  SOLN COMPARISON:  05/08/2023 FINDINGS: Lower chest: No acute abnormality. Hepatobiliary: No focal liver abnormality is seen. No gallstones, gallbladder wall thickening, or biliary dilatation. Pancreas: Unremarkable. No pancreatic ductal dilatation or surrounding inflammatory changes. Spleen: Normal in size without focal abnormality. Adrenals/Urinary Tract: Adrenal glands are within normal limits bilaterally. Kidneys demonstrate a normal enhancement pattern. Bilateral renal cysts are noted. No follow-up is recommended. No obstructive changes are seen. The bladder is partially distended. Stomach/Bowel: No obstructive or inflammatory changes of the colon are noted. The appendix is within normal limits. Surgical changes are noted in the small bowel. No obstructive changes are seen. Stomach is within normal limits. Vascular/Lymphatic: Mild vascular calcifications are noted. Scattered small mesenteric lymph nodes are noted.  A large centrally necrotic mass lesion is noted within the mesentery to the left of the midline similar to that seen on the prior exam. Air is noted within now consistent with increasing infection. The mass now measures approximately 11.5 x 9.1 cm. Reproductive: Prostate is unremarkable. Other: No abdominal wall hernia or abnormality. No abdominopelvic ascites. Musculoskeletal: No acute or significant osseous findings. IMPRESSION: Large centrally necrotic mass lesion is again identified within the mesentery. Increased air is noted within likely related to super infection. Previously seen surgical drain has been removed. No other focal abnormality is noted. Electronically Signed   By: Alcide Clever M.D.   On: 06/04/2023 01:42   DG CHEST PORT 1 VIEW Result Date: 06/03/2023 CLINICAL DATA:  Neutropenic fever EXAM: PORTABLE CHEST 1 VIEW COMPARISON:  05/02/2023 PET-CT FINDINGS: Right chest wall port is noted in satisfactory position. Cardiac shadow is within normal limits. The lungs are well aerated without focal infiltrate. No bony abnormality is seen. IMPRESSION: No acute abnormality noted. Electronically Signed   By: Alcide Clever M.D.   On: 06/03/2023 21:09           LOS: 2 days   Time spent= 35 mins    Miguel Rota, MD [facility]  If 7PM-7AM, please contact night-coverage  06/05/2023, 11:06 AM

## 2023-06-05 NOTE — H&P (Signed)
 Chief Complaint: T cell lymphoma, request for image guided abdominal fluid collection drain placement  Referring Provider(s): [name],John T IV Carl Best, PA-C  Supervising Physician: Roanna Banning  Patient Status: [facility] - In-pt  History of Present Illness: TEMITOPE GRIFFING is a 68 y.o. 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 Recent admission 1/9 to 1/16 for small bowel mass with perforation; ex lap, SBR 04/10/23 by Dr. Audrie Lia.  Current admission after presenting for fever and dizziness. CT of the abdomen and pelvis was performed and showed large necrotic mesenteric mass now containing air. [facility] consulted IR for image guided abdominal fluid collection aspiration with possible drain placement given poor outcomes expected with surgical intervention and likelihood that fluid collection communicates with bowel.    Patient is Full Code  Past Medical History:  Diagnosis Date   Acid reflux    Allergic rhinitis    Asthma    Bloating    BPH (benign prostatic hyperplasia)    Depression    ED (erectile dysfunction)    Epigastric pain    Exercise-induced asthma    GERD (gastroesophageal reflux disease)    History of kidney stones    Hypercholesteremia    Migraine 2009   Polymyalgia rheumatica (HCC)    Prediabetes    started on metformin during january hospitalization   Prostate disease    Sleep apnea     Past Surgical History:  Procedure Laterality Date   ANKLE SURGERY Right    BOWEL RESECTION N/A 04/10/2023   Procedure: SMALL BOWEL RESECTION WITH MASS;  Surgeon: Moise Boring, MD;  Location: [facility] OR;  Service: General;  Laterality: N/A;   CHEST TUBE INSERTION Left    COLONOSCOPY     CYSTOSCOPY WITH RETROGRADE PYELOGRAM, URETEROSCOPY AND STENT PLACEMENT Left 06/29/2020   Procedure: CYSTOSCOPY WITH RETROGRADE PYELOGRAM, URETEROSCOPY , BASKET EXTRACTION, AND STENT PLACEMENT;  Surgeon: Sebastian Ache, MD;  Location: [facility] ORS;  Service: Urology;  Laterality: Left;  1 HR   IR IMAGING GUIDED PORT INSERTION  05/13/2023   KNEE SURGERY Right    LAPAROTOMY N/A 04/10/2023   Procedure: EXPLORATORY LAPAROTOMY;  Surgeon: Moise Boring, MD;  Location: [facility] OR;  Service: General;  Laterality: N/A;   SKIN CANCER EXCISION     unsure   TONSILLECTOMY     VASECTOMY     WISDOM TOOTH EXTRACTION      Allergies: Singulair [montelukast], Amlodipine, and Crestor [rosuvastatin]  Medications: Prior to Admission medications   Medication Sig Start Date End Date Taking? Authorizing Provider  acetaminophen (TYLENOL) 325 MG tablet Take 2 tablets (650 mg total) by mouth every 6 (six) hours as needed. 04/16/23  Yes Barnetta Chapel, PA-C  albuterol (VENTOLIN HFA) 108 (90 Base) MCG/ACT inhaler Inhale 2 puffs into the lungs as needed (max of 12 puffs per day) 05/04/23  Yes   alfuzosin (UROXATRAL) 10 MG 24 hr tablet Take 1 tablet (10 mg total) by mouth daily. 01/03/23  Yes   allopurinol (ZYLOPRIM) 300 MG tablet Take 1 tablet (300 mg total) by mouth daily. 05/19/23  Yes Jaci Standard, MD  CINNAMON PO Take 1 capsule by mouth daily.   Yes [provider]  finasteride (PROSCAR) 5 MG tablet Take 1 tablet (5 mg total) by mouth daily. 02/11/23  Yes   lidocaine-prilocaine (EMLA) cream Apply 1 Application topically as needed. 05/19/23  Yes Jaci Standard, MD  loratadine (CLARITIN) 10 MG  tablet Take 10 mg by mouth daily.   Yes [provider]  methocarbamol (ROBAXIN) 500 MG tablet Take 2 tablets (1,000 mg total) by mouth every 8 (eight) hours as needed for muscle spasms. 04/16/23  Yes Barnetta Chapel, PA-C  minoxidil (LONITEN) 2.5 MG tablet Take 1 tablet (2.5 mg total) by mouth daily. 12/03/22  Yes   ondansetron (ZOFRAN) 8 MG tablet Take 1 tablet (8 mg total) by mouth every 8 (eight) hours as needed. Patient taking differently: Take 8 mg by mouth every 8 (eight) hours as needed for refractory nausea /  vomiting. 05/19/23  Yes Jaci Standard, MD  oxyCODONE (OXY IR/ROXICODONE) 5 MG immediate release tablet Take 1-2 tablets (5-10 mg total) by mouth every 6 (six) hours as needed for severe pain (pain score 7-10). 04/23/23  Yes Jaci Standard, MD  pantoprazole (PROTONIX) 40 MG tablet Take 1 tablet (40 mg total) by mouth daily 30 minutes to 1 hour before morning meal. 05/01/23  Yes   predniSONE (DELTASONE) 1 MG tablet Take 4 tablets by mouth daily, along with a 5mg  tablet for a total of 9mg  daily for 1 month. Taper by 1mg  monthly. 05/27/23  Yes Deveshwar, Janalyn Rouse, MD  Probiotic TBEC Take 1 capsule by mouth daily.   Yes [provider]  prochlorperazine (COMPAZINE) 10 MG tablet Take 1 tablet (10 mg total) by mouth every 6 (six) hours as needed for nausea or vomiting. 05/19/23  Yes Jaci Standard, MD  Accu-Chek Softclix Lancets lancets Use daily before breakfast. 04/16/23   Narda Bonds, MD  Blood Glucose Monitoring Suppl (ACCU-CHEK GUIDE) w/Device KIT Use daily before breakfast. 04/16/23   Narda Bonds, MD  glucose blood (ACCU-CHEK GUIDE TEST) test strip Use daily before breakfast. 04/16/23   Narda Bonds, MD  Glucose Blood (BLOOD GLUCOSE TEST STRIPS) STRP Use to check blood sugar before breakfast as directed 04/16/23   Narda Bonds, MD     Family History  Problem Relation Age of Onset   Healthy Son    Healthy Daughter     Social History   Socioeconomic History   Marital status: Married    Spouse name: Not on file   Number of children: Not on file   Years of education: Not on file   Highest education level: Not on file  Occupational History   Not on file  Tobacco Use   Smoking status: Never    Passive exposure: Never   Smokeless tobacco: Never  Vaping Use   Vaping status: Never Used  Substance and Sexual Activity   Alcohol use: Yes    Comment: social   Drug use: Never   Sexual activity: Not on file  Other Topics Concern   Not on file  Social History Narrative    Not on file   Social Drivers of Health   Financial Resource Strain: Not on file  Food Insecurity: No Food Insecurity (06/03/2023)   Hunger Vital Sign    Worried About Running Out of Food in the Last Year: Never true    Ran Out of Food in the Last Year: Never true  Transportation Needs: No Transportation Needs (06/03/2023)   PRAPARE - Administrator, Civil Service (Medical): No    Lack of Transportation (Non-Medical): No  Physical Activity: Not on file  Stress: Not on file  Social Connections: Socially Integrated (06/04/2023)   Social Connection and Isolation Panel [NHANES]    Frequency of Communication with Friends  and Family: More than three times a week    Frequency of Social Gatherings with Friends and Family: Three times a week    Attends Religious Services: More than 4 times per year    Active Member of Clubs or Organizations: No    Attends Banker Meetings: 1 to 4 times per year    Marital Status: Married       Review of Systems  Constitutional:  Negative for chills and fever.  Cardiovascular:  Negative for chest pain and leg swelling.  Gastrointestinal:  Negative for abdominal pain, nausea and vomiting.  Neurological:  Negative for dizziness.     Vital Signs: BP (!) 140/75 (BP Location: Left Arm)   Pulse 92   Temp 98.3 F (36.8 C) (Oral)   Resp 16   Ht 6\' 1"  (1.854 m)   Wt 226 lb 13.7 oz (102.9 kg)   SpO2 95%   BMI 29.93 kg/m   Advance Care Plan: No documents on file   Physical Exam Constitutional:      Appearance: Normal appearance.  HENT:     Mouth/Throat:     Mouth: Mucous membranes are moist.     Pharynx: Oropharynx is clear.  Cardiovascular:     Rate and Rhythm: Normal rate and regular rhythm.     Pulses: Normal pulses.     Heart sounds: Normal heart sounds. No murmur heard.    No friction rub. No gallop.  Pulmonary:     Effort: Pulmonary effort is normal.     Breath sounds: Normal breath sounds.  Abdominal:      Palpations: Abdomen is soft.     Tenderness: There is no abdominal tenderness.     Comments: Midline wound vac present, no erythema or leakage  Skin:    General: Skin is warm and dry.     Capillary Refill: Capillary refill takes less than 2 seconds.  Neurological:     General: No focal deficit present.     Mental Status: He is alert and oriented to person, place, and time.      Imaging: CT ABDOMEN PELVIS W CONTRAST Result Date: 06/04/2023 CLINICAL DATA:  History of recent small bowel resection with ongoing nausea, initial encounter EXAM: CT ABDOMEN AND PELVIS WITH CONTRAST TECHNIQUE: Multidetector CT imaging of the abdomen and pelvis was performed using the standard protocol following bolus administration of intravenous contrast. RADIATION DOSE REDUCTION: This exam was performed according to the departmental dose-optimization program which includes automated exposure control, adjustment of the mA and/or kV according to patient size and/or use of iterative reconstruction technique. CONTRAST:  OMNIPAQUE IOHEXOL 300 MG/ML  SOLN COMPARISON:  05/08/2023 FINDINGS: Lower chest: No acute abnormality. Hepatobiliary: No focal liver abnormality is seen. No gallstones, gallbladder wall thickening, or biliary dilatation. Pancreas: Unremarkable. No pancreatic ductal dilatation or surrounding inflammatory changes. Spleen: Normal in size without focal abnormality. Adrenals/Urinary Tract: Adrenal glands are within normal limits bilaterally. Kidneys demonstrate a normal enhancement pattern. Bilateral renal cysts are noted. No follow-up is recommended. No obstructive changes are seen. The bladder is partially distended. Stomach/Bowel: No obstructive or inflammatory changes of the colon are noted. The appendix is within normal limits. Surgical changes are noted in the small bowel. No obstructive changes are seen. Stomach is within normal limits. Vascular/Lymphatic: Mild vascular calcifications are noted. Scattered  small mesenteric lymph nodes are noted. A large centrally necrotic mass lesion is noted within the mesentery to the left of the midline similar to that seen on the  prior exam. Air is noted within now consistent with increasing infection. The mass now measures approximately 11.5 x 9.1 cm. Reproductive: Prostate is unremarkable. Other: No abdominal wall hernia or abnormality. No abdominopelvic ascites. Musculoskeletal: No acute or significant osseous findings. IMPRESSION: Large centrally necrotic mass lesion is again identified within the mesentery. Increased air is noted within likely related to super infection. Previously seen surgical drain has been removed. No other focal abnormality is noted. Electronically Signed   By: Alcide Clever M.D.   On: 06/04/2023 01:42   DG CHEST PORT 1 VIEW Result Date: 06/03/2023 CLINICAL DATA:  Neutropenic fever EXAM: PORTABLE CHEST 1 VIEW COMPARISON:  05/02/2023 PET-CT FINDINGS: Right chest wall port is noted in satisfactory position. Cardiac shadow is within normal limits. The lungs are well aerated without focal infiltrate. No bony abnormality is seen. IMPRESSION: No acute abnormality noted. Electronically Signed   By: Alcide Clever M.D.   On: 06/03/2023 21:09   IR IMAGING GUIDED PORT INSERTION Result Date: 05/13/2023 INDICATION: port for chemo.  History of T-cell lymphoma. EXAM: IMPLANTED PORT A CATH PLACEMENT WITH ULTRASOUND AND FLUOROSCOPIC GUIDANCE MEDICATIONS: Ancef 2 gm IV; The antibiotic was administered within an appropriate time interval prior to skin puncture. ANESTHESIA/SEDATION: Moderate (conscious) sedation was employed during this procedure. A total of Versed 3.5 mg and Fentanyl 100 mcg was administered intravenously. Moderate Sedation Time: 16 minutes. The patient's level of consciousness and vital signs were monitored continuously by radiology nursing throughout the procedure under my direct supervision. FLUOROSCOPY TIME:  Fluoroscopic dose; 0 mGy COMPLICATIONS:  None immediate. PROCEDURE: The procedure, risks, benefits, and alternatives were explained to the patient. Questions regarding the procedure were encouraged and answered. The patient understands and consents to the procedure. The RIGHT neck and chest were prepped with chlorhexidine in a sterile fashion, and a sterile drape was applied covering the operative field. Maximum barrier sterile technique with sterile gowns and gloves were used for the procedure. A timeout was performed prior to the initiation of the procedure. Local anesthesia was provided with 1% lidocaine with epinephrine. After creating a small venotomy incision, a micropuncture kit was utilized to access the internal jugular vein under direct, real-time ultrasound guidance. Ultrasound image documentation was performed. The microwire was kinked to measure appropriate catheter length. A subcutaneous port pocket was then created along the upper chest wall utilizing a combination of sharp and blunt dissection. The pocket was irrigated with sterile saline. A single lumen power injectable port was chosen for placement. The 8 Fr catheter was tunneled from the port pocket site to the venotomy incision. The port was placed in the pocket. The external catheter was trimmed to appropriate length. At the venotomy, an 8 Fr peel-away sheath was placed over a guidewire under fluoroscopic guidance. The catheter was then placed through the sheath and the sheath was removed. Final catheter positioning was confirmed and documented with a fluoroscopic spot radiograph. The port was accessed with a Huber needle, aspirated and flushed with heparinized saline. The port pocket incision was closed with interrupted 3-0 Vicryl suture then Dermabond was applied, including at the venotomy incision. Dressings were placed. The patient tolerated the procedure well without immediate post procedural complication. IMPRESSION: Successful placement of a RIGHT internal jugular approach power  injectable Port-A-Cath. The tip of the catheter is positioned within the proximal RIGHT atrium. The catheter is ready for immediate use. Roanna Banning, MD [facility] Electronically Signed   By: Roanna Banning M.D.   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 * Tracking Code: BO * EXAM: CT ABDOMEN AND PELVIS WITH CONTRAST TECHNIQUE: Multidetector CT imaging of the abdomen and pelvis was performed using the standard protocol following bolus administration of intravenous contrast. RADIATION DOSE REDUCTION: This exam was performed according to the departmental dose-optimization program which includes automated exposure control, adjustment of the mA and/or kV according to patient size and/or use of iterative reconstruction technique. CONTRAST:  OMNIPAQUE IOHEXOL 300 MG/ML  SOLN COMPARISON:  PET-CT on 05/02/2023 FINDINGS: Lower Chest: No acute findings. Hepatobiliary:  No suspicious hepatic masses identified. Pancreas:  No mass or inflammatory changes. Spleen: Within normal limits in size and appearance. Adrenals/Urinary Tract: No suspicious masses identified. No evidence of ureteral calculi or hydronephrosis. Stomach/Bowel: Large poorly defined mass with central necrosis is again seen in the left lower quadrant mesentery which shows involvement of small bowel loops. This measures 11.8 x 7.6 cm on image 73/2, compared to 10.7 x 7.4 cm at same level on previous study. Small adjacent mesenteric soft tissue nodules or lymph nodes are again seen, some showing mild increase in size since previous study. Index lesion measures 2.0 x 1.7 cm on image 71/2 compared to 1.7 x 1.2 cm on prior exam. Surgical drain remains in place. No No evidence of bowel obstruction. Vascular/Lymphatic: Mild increase in small mesenteric soft tissue nodules or lymph nodes, as described  above. No acute vascular findings. Reproductive:  Stable mildly enlarged prostate. Other:  None. Musculoskeletal:  No suspicious bone lesions identified. IMPRESSION: Mild increase in size of large necrotic mass in left lower quadrant mesentery, which involves small bowel. Mild increase in size of adjacent small mesenteric soft tissue nodules or lymph nodes. Electronically Signed   By: Danae Orleans M.D.   On: 05/08/2023 14:39    Labs:  CBC: Recent Labs    05/26/23 1346 06/03/23 1050 06/04/23 0443 06/05/23 0510  WBC 11.0* 4.6 12.5* 18.6*  HGB 12.0* 11.8* 10.2* 9.6*  HCT 38.0* 36.6* 32.9* 31.3*  PLT 300 250 201 189    COAGS: No results for input(s): "INR", "APTT" in the last 8760 hours.  BMP: Recent Labs    05/26/23 1346 06/03/23 1050 06/04/23 0443 06/05/23 0510  NA 137 140 136 140  K 4.2 3.7 3.8 3.2*  CL 104 104 105 106  CO2 24 25 21* 23  GLUCOSE 239* 169* 119* 139*  BUN 25* 23 18 14   CALCIUM 9.7 9.3 8.4* 8.6*  CREATININE 1.12 1.27* 0.96 0.87  GFRNONAA >60 >60 >60 >60    LIVER FUNCTION TESTS: Recent Labs    04/23/23 1543 05/19/23 0949 05/26/23 1346 06/03/23 1050  BILITOT 0.3 0.4 0.3 0.5  AST 16 14* 14* 13*  ALT 27 20 16 22   ALKPHOS 74 49 57 66  PROT 7.3 6.6 7.1 6.8  ALBUMIN 3.8 3.8 3.8 3.7    TUMOR MARKERS: No results for input(s): "AFPTM", "CEA", "CA199", "CHROMGRNA" in the last 8760 hours.  Assessment and Plan:  BERTEL VENARD is a 68 y.o. 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 Recent admission 1/9 to 1/16 for small bowel mass with perforation; ex lap, SBR 04/10/23 by Dr. Audrie Lia.  Current admission after presenting for fever and dizziness. CT of the abdomen and pelvis was performed and showed large necrotic mesenteric mass now containing air. CCS consulted IR for  Mesenteric Mass  -CT 3/5: Large centrally necrotic mass lesion is again identified within the mesentery.  Increased air is noted within likely related to super infection. Previously seen surgical drain has been removed. -Image guided abdominal fluid collection aspiration with possible drain placement. -confirmed pt has been NPO -not on a blood thinner, IP lovenox held -3/6: WBC 18.6, Hgb 9.6, plts 189 -spoke with patient and wife at bedside with CCS team, consent signed and in chart, pt understands that this drain may be in place long term depending on whether fluid collection communicates with bowel.  Thank you for allowing our service to participate in CHASTIN RIESGO 's care.   Risks and benefits discussed with the patient including bleeding, infection, damage to adjacent structures, bowel perforation/fistula connection, and sepsis.  All of the patient's questions were answered, patient is agreeable to proceed. Consent signed and in chart.    Electronically Signed: Carlton Adam, NP   06/05/2023, 11:06 AM     I spent a total of 40 Minutes in face to face in clinical consultation, greater than 50% of which was counseling/coordinating care for Image guided abdominal fluid collection aspiration  with possible drain placement.   (A copy of this note was sent to the referring provider and the time of visit.)

## 2023-06-05 NOTE — Progress Notes (Signed)
 [facility] Progress Note     Subjective: No nausea/vomiting or significant abdominal pain this am. Has only had a few sips of Gatorade this am  Wife at bedside  Objective: Vital signs in last 24 hours: Temp:  [98 F (36.7 C)-99.1 F (37.3 C)] 98.3 F (36.8 C) (03/06 0558) Pulse Rate:  [92-106] 92 (03/06 0558) Resp:  [16-20] 16 (03/06 0558) BP: (130-148)/(68-82) 140/75 (03/06 0558) SpO2:  [94 %-96 %] 95 % (03/06 0558) Weight:  [102.9 kg] 102.9 kg (03/05 1102) Last BM Date : 06/03/23  Intake/Output from previous day: 03/05 0701 - 03/06 0700 In: 744.4 [P.O.:240; I.V.:76; IV Piggyback:428.4] Out: -  Intake/Output this shift: No intake/output data recorded.  PE: Gen:  Alert, NAD, pleasant Pulm:  Normal effort ORA  Abd: Soft, non-tender, no distention, VAC in place holding suction. Cannister empty Skin: warm and dry, no rashes  Psych: A&Ox3   Lab Results:  Recent Labs    06/04/23 0443 06/05/23 0510  WBC 12.5* 18.6*  HGB 10.2* 9.6*  HCT 32.9* 31.3*  PLT 201 189   BMET Recent Labs    06/04/23 0443 06/05/23 0510  NA 136 140  K 3.8 3.2*  CL 105 106  CO2 21* 23  GLUCOSE 119* 139*  BUN 18 14  CREATININE 0.96 0.87  CALCIUM 8.4* 8.6*   PT/INR No results for input(s): "LABPROT", "INR" in the last 72 hours. CMP     Component Value Date/Time   NA 140 06/05/2023 0510   K 3.2 (L) 06/05/2023 0510   CL 106 06/05/2023 0510   CO2 23 06/05/2023 0510   GLUCOSE 139 (H) 06/05/2023 0510   BUN 14 06/05/2023 0510   CREATININE 0.87 06/05/2023 0510   CREATININE 1.27 (H) 06/03/2023 1050   CALCIUM 8.6 (L) 06/05/2023 0510   PROT 6.8 06/03/2023 1050   ALBUMIN 3.7 06/03/2023 1050   AST 13 (L) 06/03/2023 1050   ALT 22 06/03/2023 1050   ALKPHOS 66 06/03/2023 1050   BILITOT 0.5 06/03/2023 1050   GFRNONAA >60 06/05/2023 0510   GFRNONAA >60 06/03/2023 1050   GFRAA  06/17/2007 2140    >60        The eGFR has been calculated using the MDRD equation. This  calculation has not been validated in all clinical   Lipase     Component Value Date/Time   LIPASE 11 04/10/2023 0629       Studies/Results: CT ABDOMEN PELVIS W CONTRAST Result Date: 06/04/2023 CLINICAL DATA:  History of recent small bowel resection with ongoing nausea, initial encounter EXAM: CT ABDOMEN AND PELVIS WITH CONTRAST TECHNIQUE: Multidetector CT imaging of the abdomen and pelvis was performed using the standard protocol following bolus administration of intravenous contrast. RADIATION DOSE REDUCTION: This exam was performed according to the departmental dose-optimization program which includes automated exposure control, adjustment of the mA and/or kV according to patient size and/or use of iterative reconstruction technique. CONTRAST:  OMNIPAQUE IOHEXOL 300 MG/ML  SOLN COMPARISON:  05/08/2023 FINDINGS: Lower chest: No acute abnormality. Hepatobiliary: No focal liver abnormality is seen. No gallstones, gallbladder wall thickening, or biliary dilatation. Pancreas: Unremarkable. No pancreatic ductal dilatation or surrounding inflammatory changes. Spleen: Normal in size without focal abnormality. Adrenals/Urinary Tract: Adrenal glands are within normal limits bilaterally. Kidneys demonstrate a normal enhancement pattern. Bilateral renal cysts are noted. No follow-up is recommended. No obstructive changes are seen. The bladder is partially distended. Stomach/Bowel: No obstructive or inflammatory changes of the colon are noted. The appendix is within  normal limits. Surgical changes are noted in the small bowel. No obstructive changes are seen. Stomach is within normal limits. Vascular/Lymphatic: Mild vascular calcifications are noted. Scattered small mesenteric lymph nodes are noted. A large centrally necrotic mass lesion is noted within the mesentery to the left of the midline similar to that seen on the prior exam. Air is noted within now consistent with increasing infection. The mass now  measures approximately 11.5 x 9.1 cm. Reproductive: Prostate is unremarkable. Other: No abdominal wall hernia or abnormality. No abdominopelvic ascites. Musculoskeletal: No acute or significant osseous findings. IMPRESSION: Large centrally necrotic mass lesion is again identified within the mesentery. Increased air is noted within likely related to super infection. Previously seen surgical drain has been removed. No other focal abnormality is noted. Electronically Signed   By: Alcide Clever M.D.   On: 06/04/2023 01:42   DG CHEST PORT 1 VIEW Result Date: 06/03/2023 CLINICAL DATA:  Neutropenic fever EXAM: PORTABLE CHEST 1 VIEW COMPARISON:  05/02/2023 PET-CT FINDINGS: Right chest wall port is noted in satisfactory position. Cardiac shadow is within normal limits. The lungs are well aerated without focal infiltrate. No bony abnormality is seen. IMPRESSION: No acute abnormality noted. Electronically Signed   By: Alcide Clever M.D.   On: 06/03/2023 21:09    Anti-infectives: Anti-infectives (From admission, onward)    Start     Dose/Rate Route Frequency Ordered Stop   06/04/23 0930  metroNIDAZOLE (FLAGYL) IVPB 500 mg        500 mg 100 mL/hr over 60 Minutes Intravenous Every 12 hours 06/04/23 0846     06/03/23 2000  vancomycin (VANCOREADY) IVPB 1500 mg/300 mL  Status:  Discontinued        1,500 mg 150 mL/hr over 120 Minutes Intravenous Daily 06/03/23 1819 06/04/23 0846   06/03/23 2000  ceFEPIme (MAXIPIME) 2 g in sodium chloride 0.9 % 100 mL IVPB        2 g 200 mL/hr over 30 Minutes Intravenous Every 8 hours 06/03/23 1819          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 Hillery Hunter - known positive tumor margins who has evidence of tumor necrosis of the mesentery and possible superinfection and possible communication with bowel  - WBC 18 (12.5) this am on IV abx in setting of steroid use and recent neulasta injection -  Case reviewed with Dr. Cliffton Asters and discussed at length with oncology and [facility] teams. There is risk for ongoing and worsening infection despite antibiotics with surgical intervention even now highly morbid with likely extensive small bowel and colonic resection which would create prohibitive delay in systemic therapy. Recommend IR drain placement for source control. Given the possible communication with bowel there is a risk for fistula formation. This was all discussed with patient and his wife. Their main concern is getting back to systemic therapy as soon as possible and removing any barriers to that goal. We discussed that infection alone can delay course and the goal of better infection control/improvement with drain placement. Plan also discussed with oncology team who plan to re evaluate patient as early as 3/18 to make therapy plans based on clinical improvement at that time. Discussed with IR who will evaluate for drain.  FEN: NPO for IR eval ID: [email or web address]. rocephin/[email or web address]>> VTE: LMWH held for IR eval    LOS: 2 days   I reviewed nursing notes, Consultant oncology notes, hospitalist notes,  last 24 h vitals and pain scores, last 48 h intake and output, last 24 h labs and trends, and last 24 h imaging results and discussed care plan with oncology and [facility]   Eric Form, [facility] 06/05/2023, 10:53 AM Please see Amion for pager number during day hours 7:00am-4:30pm

## 2023-06-06 DIAGNOSIS — R5081 Fever presenting with conditions classified elsewhere: Secondary | ICD-10-CM | POA: Diagnosis not present

## 2023-06-06 DIAGNOSIS — D709 Neutropenia, unspecified: Secondary | ICD-10-CM | POA: Diagnosis not present

## 2023-06-06 LAB — GLUCOSE, CAPILLARY
Glucose-Capillary: 156 mg/dL — ABNORMAL HIGH (ref 70–99)
Glucose-Capillary: 164 mg/dL — ABNORMAL HIGH (ref 70–99)
Glucose-Capillary: 165 mg/dL — ABNORMAL HIGH (ref 70–99)
Glucose-Capillary: 210 mg/dL — ABNORMAL HIGH (ref 70–99)

## 2023-06-06 LAB — CBC WITH DIFFERENTIAL/PLATELET
Abs Immature Granulocytes: 3.41 10*3/uL — ABNORMAL HIGH (ref 0.00–0.07)
Basophils Absolute: 0.1 10*3/uL (ref 0.0–0.1)
Basophils Relative: 0 %
Eosinophils Absolute: 0.1 10*3/uL (ref 0.0–0.5)
Eosinophils Relative: 1 %
HCT: 32.3 % — ABNORMAL LOW (ref 39.0–52.0)
Hemoglobin: 10 g/dL — ABNORMAL LOW (ref 13.0–17.0)
Immature Granulocytes: 18 %
Lymphocytes Relative: 6 %
Lymphs Abs: 1.1 10*3/uL (ref 0.7–4.0)
MCH: 28.2 pg (ref 26.0–34.0)
MCHC: 31 g/dL (ref 30.0–36.0)
MCV: 91 fL (ref 80.0–100.0)
Monocytes Absolute: 1.7 10*3/uL — ABNORMAL HIGH (ref 0.1–1.0)
Monocytes Relative: 9 %
Neutro Abs: 12.6 10*3/uL — ABNORMAL HIGH (ref 1.7–7.7)
Neutrophils Relative %: 66 %
Platelets: 190 10*3/uL (ref 150–400)
RBC: 3.55 MIL/uL — ABNORMAL LOW (ref 4.22–5.81)
RDW: 15.2 % (ref 11.5–15.5)
WBC: 19 10*3/uL — ABNORMAL HIGH (ref 4.0–10.5)
nRBC: 0.2 % (ref 0.0–0.2)

## 2023-06-06 LAB — RESPIRATORY PANEL BY PCR

## 2023-06-06 LAB — BASIC METABOLIC PANEL
Anion gap: 10 (ref 5–15)
BUN: 14 mg/dL (ref 8–23)
CO2: 25 mmol/L (ref 22–32)
Calcium: 8.1 mg/dL — ABNORMAL LOW (ref 8.9–10.3)
Chloride: 106 mmol/L (ref 98–111)
Creatinine, Ser: 0.94 mg/dL (ref 0.61–1.24)
GFR, Estimated: >60 mL/min (ref >60)
Glucose, Bld: 128 mg/dL — ABNORMAL HIGH (ref 70–99)
Potassium: 3.4 mmol/L — ABNORMAL LOW (ref 3.5–5.1)
Sodium: 141 mmol/L (ref 135–145)

## 2023-06-06 LAB — MAGNESIUM: Magnesium: 2 mg/dL (ref 1.7–2.4)

## 2023-06-06 MED ORDER — POTASSIUM CHLORIDE 20 MEQ PO PACK
40.0000 meq | PACK | Freq: Once | ORAL | Status: AC
  Administered 2023-06-06: 40 meq via ORAL
  Filled 2023-06-06: qty 2

## 2023-06-06 MED ORDER — POLYETHYLENE GLYCOL 3350 17 G PO PACK: 17.0000 g | PACK | Freq: Every day | ORAL | Status: DC | PRN

## 2023-06-06 MED ORDER — SODIUM CHLORIDE 0.9% FLUSH
10.0000 mL | Freq: Two times a day (BID) | INTRAVENOUS | Status: DC
  Administered 2023-06-06 – 2023-06-07 (×3): 10 mL

## 2023-06-06 MED ORDER — SENNOSIDES-DOCUSATE SODIUM 8.6-50 MG PO TABS
1.0000 | ORAL_TABLET | Freq: Every day | ORAL | Status: DC
  Administered 2023-06-06: 1 via ORAL
  Filled 2023-06-06: qty 1

## 2023-06-06 MED ORDER — HEPARIN SOD (PORK) LOCK FLUSH 100 UNIT/ML IV SOLN: 500.0000 [IU] | Freq: Once | INTRAVENOUS | Status: DC

## 2023-06-06 MED ORDER — SODIUM CHLORIDE 0.9% FLUSH: 10.0000 mL | INTRAVENOUS | Status: DC | PRN

## 2023-06-06 MED ORDER — CHLORHEXIDINE GLUCONATE CLOTH 2 % EX PADS
6.0000 | MEDICATED_PAD | Freq: Every day | CUTANEOUS | Status: DC
  Administered 2023-06-07: 6 via TOPICAL

## 2023-06-06 NOTE — Progress Notes (Signed)
 PROGRESS NOTE    ERLIN GARDELLA  ZOX:[id number] DOB: [birth year] DOA: 06/03/2023 PCP: Tally Joe, MD   Brief Narrative:  This 68 year old Male with history of recently diagnosed T-cell lymphoma,  started chemo last week, GERD, BPH, PMNR, HLD, prediabetes, OSA presented from the cancer clinic for dizziness.  Patient was found to be febrile, hypotensive and tachycardic.  Initially broad-spectrum antibiotics were started and admitted for SIRS. Workup reveals left lower quadrant abscess. IR consulted, underwent percutaneous biliary drain. He was admitted in January for perforated small bowel and was diagnosed with lymphoma requiring small bowel resection.  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 He was recently diagnosed of T-cell lymphoma requiring small bowel resection in January secondary to perforation.  First round of chemo started on 05/26/23. This patient does not have neutropenia COVID/flu/RSV-negative Respiratory panel still pending.  Cultures from the clinic are negative, UA is negative.   Chest x-ray is negative CT abdomen pelvis shows large previously noted mesenteric necrotic lesion with some evidence of air with possible superimposed infection.  Discussed with general surgery.  Due to rising WBC, plans for IR drain placement.  Antibiotics changed to Rocephin and Flagyl.  Leukocytosis possibly related to prednisone vs infection.  Patient underwent successful left lower quadrant abscess percutaneous drain.   Hypokalemia: Replaced. Continue to monitor.  AKI (acute kidney injury)  (HCC), resolved Admission creatinine 1.2, now at baseline 0.9   Steroid-induced diabetes (HCC) On chronic prednisone. Continue  Sliding scale and Accu-Cheks   PMR (polymyalgia rheumatica) (HCC) Continue prednisone 15mg  daily (tapering down).   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 Leonides Schanz   Gastroesophageal reflux disease without esophagitis: Continue protonix 40 mg daily    BPH (benign prostatic hyperplasia) Continue finasteride and alfuzosin,   Obstructive sleep apnea Cpap nightly, does not want while inpatient  DVT prophylaxis:SCDs Code Status: Full code Family Communication: Wife at bed side Disposition Plan:    Status is: Inpatient Remains inpatient appropriate because: Severity of illness.   Consultants:  IR Oncology  Procedures: PTC Biliary drain Antimicrobials:  Anti-infectives (From admission, onward)    Start     Dose/Rate Route Frequency Ordered Stop   06/05/23 0945  cefTRIAXone (ROCEPHIN) 2 g in sodium chloride 0.9 % 100 mL IVPB        2 g 200 mL/hr over 30 Minutes Intravenous Every 24 hours 06/05/23 0853     06/04/23 0930  metroNIDAZOLE (FLAGYL) IVPB 500 mg        500 mg 100 mL/hr over 60 Minutes Intravenous Every 12 hours 06/04/23 0846     06/03/23 2000  vancomycin (VANCOREADY) IVPB 1500 mg/300 mL  Status:  Discontinued        1,500 mg 150 mL/hr over 120 Minutes Intravenous Daily 06/03/23 1819 06/04/23 0846   06/03/23 2000  ceFEPIme (MAXIPIME) 2 g in sodium chloride 0.9 % 100 mL IVPB  Status:  Discontinued        2 g 200 mL/hr over 30 Minutes Intravenous Every 8 hours 06/03/23 1819 06/05/23 0853      Subjective: Patient was seen and examined  at bedside.  Overnight events noted.   Wife is at bedside states he still has a lot of pain.   Patient is having high output in the percutaneous drain.  Objective: Vitals:   06/05/23 1900 06/05/23 2134 06/06/23 0549 06/06/23 1347  BP: (!)  155/92 (!) 153/93 (!) 164/98 (!) 148/91  Pulse: 86 95 94 (!) 104  Resp: 18 16 16 18   Temp: 97.6 F (36.4 C) 98.7 F (37.1 C) 98.6 F (37 C) 99.4 F (37.4 C)  TempSrc: Oral Oral Oral Oral  SpO2: 96% 97% 96% 96%  Weight:      Height:        Intake/Output Summary (Last 24 hours) at 06/06/2023 1452 Last data filed at 06/06/2023 1200 Gross per 24 hour  Intake 210 ml  Output 640 ml  Net -430 ml   Filed Weights   06/04/23 1102  Weight: 102.9 kg    Examination:  General exam: Appears calm and comfortable, not in any acute distress. Respiratory system: Clear to auscultation. Respiratory effort normal.  RR 15 Cardiovascular system: S1 & S2 heard, RRR. No JVD, murmurs, rubs, gallops or clicks. Gastrointestinal system: Abdomen is non distended, soft and non tender. Midline dressing noted.  Normal bowel sounds heard. Central nervous system: Alert and oriented x 3. No focal neurological deficits. Extremities: No edema, No cyanosis, No clubbing  Skin: No rashes, lesions or ulcers Psychiatry: Judgement and insight appear normal. Mood & affect appropriate.     Data Reviewed: I have personally reviewed following labs and imaging studies  CBC: Recent Labs  Lab 06/03/23 1050 06/04/23 0443 06/05/23 0510 06/06/23 0458  WBC 4.6 12.5* 18.6* 19.0*  NEUTROABS 1.5* 8.4* 12.4* 12.6*  HGB 11.8* 10.2* 9.6* 10.0*  HCT 36.6* 32.9* 31.3* 32.3*  MCV 86.3 91.1 90.5 91.0  PLT 250 201 189 190   Basic Metabolic Panel: Recent Labs  Lab 06/03/23 1050 06/04/23 0443 06/05/23 0510 06/06/23 0458  NA 140 136 140 141  K 3.7 3.8 3.2* 3.4*  CL 104 105 106 106  CO2 25 21* 23 25  GLUCOSE 169* 119* 139* 128*  BUN 23 18 14 14   CREATININE 1.27* 0.96 0.87 0.94  CALCIUM 9.3 8.4* 8.6* 8.1*  MG  --   --  2.1 2.0  PHOS  --   --  3.2  --    GFR: Estimated Creatinine Clearance: 97.4 mL/min (by C-G formula based on SCr of 0.94 mg/dL). Liver Function Tests: Recent Labs  Lab 06/03/23 1050  AST 13*  ALT  22  ALKPHOS 66  BILITOT 0.5  PROT 6.8  ALBUMIN 3.7   No results for input(s): "LIPASE", "AMYLASE" in the last 168 hours. No results for input(s): "AMMONIA" in the last 168 hours. Coagulation Profile: Recent Labs  Lab 06/05/23 1200  INR 1.1   Cardiac Enzymes: No results for input(s): "CKTOTAL", "CKMB", "CKMBINDEX", "TROPONINI" in the last 168 hours. BNP (last 3 results) No results for input(s): "PROBNP" in the last 8760 hours. HbA1C: No results for input(s): "HGBA1C" in the last 72 hours. CBG: Recent Labs  Lab 06/05/23 1124 06/05/23 1653 06/05/23 2135 06/06/23 0733 06/06/23 1130  GLUCAP 157* 143* 145* 156* 164*   Lipid Profile: No results for input(s): "CHOL", "HDL", "LDLCALC", "TRIG", "CHOLHDL", "LDLDIRECT" in the last 72 hours. Thyroid Function Tests: No results for input(s): "TSH", "T4TOTAL", "FREET4", "T3FREE", "THYROIDAB" in the last 72 hours. Anemia Panel: No results for input(s): "VITAMINB12", "FOLATE", "FERRITIN", "TIBC", "IRON", "RETICCTPCT" in the last 72 hours. Sepsis Labs:  Recent Labs  Lab 06/03/23 1928 06/03/23 2302  LATICACIDVEN 3.0* 2.1*    Recent Results (from the past 240 hours)  Culture, Urine     Status: Abnormal   Collection Time: 06/03/23 11:08 AM   Specimen: Urine, Clean Catch  Result Value Ref Range Status   Specimen Description   Final    URINE, CLEAN CATCH Performed at [facility], [address] [place], [place] [address]    Special Requests   Final    NONE Performed at [facility], [address], [place], [place] [address]    Culture (A)  Final    <10,000 COLONIES/mL INSIGNIFICANT GROWTH Performed at [facility], [address], [place], [place] [address]    Report Status 06/04/2023 FINAL  Final  Resp panel by RT-PCR (RSV, Flu A&B, Covid) Anterior Nasal Swab     Status: None   Collection Time: 06/03/23 11:51 AM   Specimen: Anterior Nasal Swab  Result Value Ref Range Status    SARS Coronavirus 2 by RT PCR NEGATIVE NEGATIVE Final   Influenza A by PCR NEGATIVE NEGATIVE Final   Influenza B by PCR NEGATIVE NEGATIVE Final    Comment: (NOTE) The Xpert Xpress SARS-CoV-2/FLU/RSV plus assay is intended as an aid in the diagnosis of influenza from Nasopharyngeal swab specimens and should not be used as a sole basis for treatment. Nasal washings and aspirates are unacceptable for Xpert Xpress SARS-CoV-2/FLU/RSV testing.  Fact Sheet for Patients: BloggerCourse.com  Fact Sheet for Healthcare Providers: SeriousBroker.it  This test is not yet approved or cleared by the [place] FDA and has been authorized for detection and/or diagnosis of SARS-CoV-2 by FDA under an Emergency Use Authorization (EUA). This EUA will remain in effect (meaning this test can be used) for the duration of the COVID-19 declaration under Section 564(b)(1) of the Act, 21 U.S.C. section 360bbb-3(b)(1), unless the authorization is terminated or revoked.     Resp Syncytial Virus by PCR NEGATIVE NEGATIVE Final    Comment: (NOTE) Fact Sheet for Patients: BloggerCourse.com  Fact Sheet for Healthcare Providers: SeriousBroker.it  This test is not yet approved or cleared by the [place] FDA and has been authorized for detection and/or diagnosis of SARS-CoV-2 by FDA under an Emergency Use Authorization (EUA). This EUA will remain in effect (meaning this test can be used) for the duration of the COVID-19 declaration under Section 564(b)(1) of the Act, 21 U.S.C. section 360bbb-3(b)(1), unless the authorization is terminated or revoked.  Performed at [facility], [address], [place], [place] [address]   Culture, blood (routine x 2)     Status: None (Preliminary result)   Collection Time: 06/03/23 12:11 PM   Specimen: BLOOD  Result Value Ref Range Status   Specimen Description    Final    BLOOD RIGHT ANTECUBITAL Performed at [facility], [address] [place], [place] [address]    Special Requests   Final    NONE Performed at [facility], [address], [place], [place] [address]    Culture   Final    NO GROWTH 3 DAYS Performed at [facility], [address] [place], [place] [address]    Report Status PENDING  Incomplete  Culture, blood (routine x 2)     Status: None (Preliminary result)   Collection Time: 06/03/23 12:26 PM   Specimen: BLOOD  Result Value Ref Range Status   Specimen Description  Final    BLOOD RIGHT ANTECUBITAL Performed at [facility], [address] [place], [place] [address]    Special Requests   Final    RIGHT ANTECUBITAL Performed at [facility], [address] [place], [place] [address]    Culture   Final    NO GROWTH 3 DAYS Performed at [facility], [address], [place], [place] [address]    Report Status PENDING  Incomplete  Aerobic/Anaerobic Culture w Gram Stain (surgical/deep wound)     Status: None (Preliminary result)   Collection Time: 06/05/23  5:12 PM   Specimen: Abscess  Result Value Ref Range Status   Specimen Description   Final    ABSCESS Performed at [facility], [address], [place], [place] [address]    Special Requests   Final    NONE Performed at [facility], [address], [place], [place] [address]    Gram Stain   Final    MODERATE WBC PRESENT,BOTH PMN AND MONONUCLEAR MODERATE GRAM POSITIVE COCCI MODERATE GRAM NEGATIVE RODS FEW GRAM POSITIVE RODS    Culture   Final    TOO YOUNG TO READ Performed at [facility], [address], [place], [place] [address]    Report Status PENDING  Incomplete    Radiology Studies: CT GUIDED PERITONEAL/RETROPERITONEAL FLUID DRAIN BY PERC CATH Result Date: 06/05/2023 INDICATION: [id number] Abdominal fluid collection [phone] Briefly,  history of T-cell lymphoma with necrotic mass at LEFT lower quadrant EXAM: CT-GUIDED DRAINAGE CATHETER PLACEMENT INTO LEFT ABDOMINAL ABSCESS COMPARISON:  CT AP, 06/03/2023. MEDICATIONS: The patient is currently admitted to the hospital and receiving intravenous antibiotics. The antibiotics were administered within an appropriate time frame prior to the initiation of the procedure. ANESTHESIA/SEDATION: Moderate (conscious) sedation was employed during this procedure. A total of Versed 4 mg and Fentanyl 100 mcg was administered intravenously. Moderate Sedation Time: 16 minutes. The patient's level of consciousness and vital signs were monitored continuously by radiology nursing throughout the procedure under my direct supervision. CONTRAST:  None FLUOROSCOPY TIME:  CT dose; 1206 mGycm COMPLICATIONS: None immediate. PROCEDURE: RADIATION DOSE REDUCTION: This exam was performed according to the departmental dose-optimization program which includes automated exposure control, adjustment of the mA and/or kV according to patient size and/or use of iterative reconstruction technique. Informed written consent was obtained from the patient after a discussion of the risks, benefits and alternatives to treatment. The patient was placed supine on the CT gantry and a pre procedural CT was performed re-demonstrating the known abscess/fluid collection within the LEFT abdomen. The procedure was planned. A timeout was performed prior to the initiation of the procedure. The LEFT was prepped and draped in the usual sterile fashion. The overlying soft tissues were anesthetized with 1% lidocaine with epinephrine. Appropriate trajectory was planned with the use of a 22 gauge spinal needle. An 18 gauge trocar needle was advanced into the abscess/fluid collection and a short Amplatz super stiff wire was coiled within the collection. Appropriate positioning was confirmed with a limited CT scan. The tract was serially dilated allowing  placement of a 12 Fr drainage catheter. Appropriate positioning was confirmed with a limited postprocedural CT scan. 30 ml of purulent fluid was aspirated. The tube was connected to a drainage bag and sutured in place. A dressing was placed. The patient tolerated the procedure well without immediate post procedural complication. IMPRESSION: Successful CT guided placement of a 12 Fr drainage catheter into the  LEFT lower quadrant abscess with aspiration of 30 mL of purulent fluid. Samples were sent to the laboratory as requested by the ordering clinical team. Roanna Banning, MD [facility] Electronically Signed   By: Roanna Banning M.D.   On: 06/05/2023 18:27    Scheduled Meds:  alfuzosin  10 mg Oral Daily   allopurinol  300 mg Oral Daily   Chlorhexidine Gluconate Cloth  6 each Topical Daily   finasteride  5 mg Oral Daily   heparin lock flush  500 Units Intravenous Once   insulin aspart  0-9 Units Subcutaneous TID WC   loratadine  10 mg Oral Daily   pantoprazole  40 mg Oral Daily   predniSONE  15 mg Oral Daily   senna-docusate  1 tablet Oral Q1200   sodium chloride flush  10-40 mL Intracatheter Q12H   sodium chloride flush  5 mL Intracatheter Q8H   Continuous Infusions:  cefTRIAXone (ROCEPHIN)  IV 2 g (06/06/23 1226)   metronidazole 500 mg (06/06/23 1321)     LOS: 3 days    Time spent: 50 mins    Willeen Niece, MD [facility]   If 7PM-7AM, please contact night-coverage

## 2023-06-06 NOTE — Progress Notes (Signed)
 Pharmacy Antibiotic Note  Elijah [name] is a 68 y.o. male admitted on 06/03/2023 with IAI  Pharmacy has been consulted for   cefepime dosing.  Per previous [facility] notes, plan to treat patient with antibiotics for 7-10 days   Plan: Cefepime 2 g IV q8 hr  Height: 6\' 1"  (185.4 cm) Weight: 102.9 kg (226 lb 13.7 oz) IBW/kg (Calculated) : 79.9  Temp (24hrs), Avg:98.3 F (36.8 C), Min:97.6 F (36.4 C), Max:99 F (37.2 C)  Recent Labs  Lab 06/03/23 1050 06/03/23 1928 06/03/23 2302 06/04/23 0443 06/05/23 0510 06/06/23 0458  WBC 4.6  --   --  12.5* 18.6* 19.0*  CREATININE 1.27*  --   --  0.96 0.87 0.94  LATICACIDVEN  --  3.0* 2.1*  --   --   --     Estimated Creatinine Clearance: 97.4 mL/min (by C-G formula based on SCr of 0.94 mg/dL).    Allergies  Allergen Reactions   Singulair [Montelukast] Hives   Amlodipine Other (See Comments)    agitation   Crestor [Rosuvastatin] Other (See Comments)    Muscle Aches     3/4 vancomycin >> 3/5 3/4 cefepime >>  3/5 flagyl >>    3/4 BCx: NGTD (note both sets state drawn from right elbow, but with different collection times) 3/4 UCx: insignif growth FINAL  3/6 Abscess culture: Moderate GPC, Moderate GNR, few GPR   Adalberto Cole, PharmD, BCPS 06/06/2023 11:21 AM

## 2023-06-06 NOTE — Progress Notes (Signed)
 Pharmacy Antibiotic Note  Elijah [name] is a 68 y.o. male admitted on 06/03/2023 with febrile neutropenia.  Pharmacy has been consulted for   cefepime dosing.  Per previous [facility] notes, plan to treat patient with antibiotics for 7-10 days   Plan: Cefepime 2 g IV q8 hr  Height: 6\' 1"  (185.4 cm) Weight: 102.9 kg (226 lb 13.7 oz) IBW/kg (Calculated) : 79.9  Temp (24hrs), Avg:98.3 F (36.8 C), Min:97.6 F (36.4 C), Max:99 F (37.2 C)  Recent Labs  Lab 06/03/23 1050 06/03/23 1928 06/03/23 2302 06/04/23 0443 06/05/23 0510 06/06/23 0458  WBC 4.6  --   --  12.5* 18.6* 19.0*  CREATININE 1.27*  --   --  0.96 0.87 0.94  LATICACIDVEN  --  3.0* 2.1*  --   --   --     Estimated Creatinine Clearance: 97.4 mL/min (by C-G formula based on SCr of 0.94 mg/dL).    Allergies  Allergen Reactions   Singulair [Montelukast] Hives   Amlodipine Other (See Comments)    agitation   Crestor [Rosuvastatin] Other (See Comments)    Muscle Aches     3/4 vancomycin >> 3/5 3/4 cefepime >>  3/5 flagyl >>    3/4 BCx: NGTD (note both sets state drawn from right elbow, but with different collection times) 3/4 UCx: insignif growth FINAL  3/6 Abscess culture: Moderate GPC, Moderate GNR, few GPR   Adalberto Cole, PharmD, BCPS 06/06/2023 11:19 AM

## 2023-06-06 NOTE — Progress Notes (Signed)
 [facility] Progress Note     Subjective: Tolerating PO without n/v. Had some pain from drain overnight that improved with pain medication. He has not been out of bed yet this am. No bm in a few days  Wife at bedside  Objective: Vital signs in last 24 hours: Temp:  [97.6 F (36.4 C)-99 F (37.2 C)] 98.6 F (37 C) (03/07 0549) Pulse Rate:  [86-98] 94 (03/07 0549) Resp:  [14-18] 16 (03/07 0549) BP: (137-164)/(84-98) 164/98 (03/07 0549) SpO2:  [95 %-99 %] 96 % (03/07 0549) Last BM Date : 06/03/23  Intake/Output from previous day: 03/06 0701 - 03/07 0700 In: 210 [I.V.:10; IV Piggyback:200] Out: 590 [Urine:400; Drains:190] Intake/Output this shift: No intake/output data recorded.  PE: Gen:  Alert, NAD, pleasant Pulm:  Normal effort ORA  Abd: Soft, non-tender, no distention, midline wound shallow with good granulation tissue. See pic from yesterday below. Drain with blood tinged thick brown output Skin: warm and dry, no rashes  Psych: A&Ox3   Lab Results:  Recent Labs    06/05/23 0510 06/06/23 0458  WBC 18.6* 19.0*  HGB 9.6* 10.0*  HCT 31.3* 32.3*  PLT 189 190   BMET Recent Labs    06/05/23 0510 06/06/23 0458  NA 140 141  K 3.2* 3.4*  CL 106 106  CO2 23 25  GLUCOSE 139* 128*  BUN 14 14  CREATININE 0.87 0.94  CALCIUM 8.6* 8.1*   PT/INR Recent Labs    06/05/23 1200  LABPROT 14.0  INR 1.1   CMP     Component Value Date/Time   NA 141 06/06/2023 0458   K 3.4 (L) 06/06/2023 0458   CL 106 06/06/2023 0458   CO2 25 06/06/2023 0458   GLUCOSE 128 (H) 06/06/2023 0458   BUN 14 06/06/2023 0458   CREATININE 0.94 06/06/2023 0458   CREATININE 1.27 (H) 06/03/2023 1050   CALCIUM 8.1 (L) 06/06/2023 0458   PROT 6.8 06/03/2023 1050   ALBUMIN 3.7 06/03/2023 1050   AST 13 (L) 06/03/2023 1050   ALT 22 06/03/2023 1050   ALKPHOS 66 06/03/2023 1050   BILITOT 0.5 06/03/2023 1050   GFRNONAA >60 06/06/2023 0458   GFRNONAA >60 06/03/2023 1050   GFRAA   06/17/2007 2140    >60        The eGFR has been calculated using the MDRD equation. 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 MEDICATIONS: The patient is currently admitted to the hospital and receiving intravenous antibiotics. The antibiotics were administered within an appropriate time frame prior to the initiation of the procedure. ANESTHESIA/SEDATION: Moderate (conscious) sedation was employed during this procedure. A total of Versed 4 mg and Fentanyl 100 mcg was administered intravenously. Moderate Sedation Time: 16 minutes. The patient's level of consciousness and vital signs were monitored continuously by radiology nursing throughout the procedure under my direct supervision. CONTRAST:  None FLUOROSCOPY TIME:  CT dose; 1206 mGycm COMPLICATIONS: None immediate. PROCEDURE: RADIATION DOSE REDUCTION: This exam was performed according to the departmental dose-optimization program which includes automated exposure control, adjustment of the mA and/or kV according to patient size  and/or use of iterative reconstruction technique. Informed written consent was obtained from the patient after a discussion of the risks, benefits and alternatives to treatment. The patient was placed supine on the CT gantry and a pre procedural CT was performed re-demonstrating the known abscess/fluid collection within the LEFT abdomen. The procedure was planned. A timeout was performed prior to the initiation of the procedure. The LEFT was prepped and draped in the usual sterile fashion. The overlying  soft tissues were anesthetized with 1% lidocaine with epinephrine. Appropriate trajectory was planned with the use of a 22 gauge spinal needle. An 18 gauge trocar needle was advanced into the abscess/fluid collection and a short Amplatz super stiff wire was coiled within the collection. Appropriate positioning was confirmed with a limited CT scan. The tract was serially dilated allowing placement of a 12 Fr drainage catheter. Appropriate positioning was confirmed with a limited postprocedural CT scan. 30 ml of purulent fluid was aspirated. The tube was connected to a drainage bag and sutured in place. A dressing was placed. The patient tolerated the procedure well without immediate post procedural complication. IMPRESSION: Successful CT guided placement of a 12 Fr drainage catheter into the LEFT lower quadrant abscess with aspiration of 30 mL of purulent fluid. Samples were sent to the laboratory as requested by the ordering clinical team. Roanna Banning, MD [facility] Radiology Electronically Signed   By: Roanna Banning M.D.   On: 06/05/2023 18:27    Anti-infectives: Anti-infectives (From admission, onward)    Start     Dose/Rate Route Frequency Ordered Stop   06/05/23 0945  cefTRIAXone (ROCEPHIN) 2 g in sodium chloride 0.9 % 100 mL IVPB        2 g 200 mL/hr over 30 Minutes Intravenous Every 24 hours 06/05/23 0853     06/04/23 0930  metroNIDAZOLE (FLAGYL) IVPB 500 mg        500 mg 100 mL/hr over 60 Minutes Intravenous Every 12 hours 06/04/23 0846     06/03/23 2000  vancomycin (VANCOREADY) IVPB 1500 mg/300 mL  Status:  Discontinued        1,500 mg 150 mL/hr over 120 Minutes Intravenous Daily 06/03/23 1819 06/04/23 0846   06/03/23 2000  ceFEPIme (MAXIPIME) 2 g in sodium chloride 0.9 % 100 mL IVPB  Status:  Discontinued        2 g 200 mL/hr over 30 Minutes Intravenous Every 8 hours 06/03/23 1819 06/05/23 0853        Assessment/Plan 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 Hillery Hunter - known positive tumor margins with evidence of tumor necrosis of the mesentery with possible superinfection and possible communication with bowel  - s/p IR drain placement 3/6 with 190 ml out so far. Gram stain with gram pos cocci, gram neg rods, gram pos rods. Culture pending - afebrile and HDS with WBC 19 (18.6) on IV abx in setting of steroid use and recent neulasta injection - recommend ongoing abx and monitoring drain output quantity and character for next 24 hours at least - inc bowel regimen  FEN: reg ID: [email or web address]. cefepime. rocephin/flagyl>> VTE: LMWH okay from our standpoint    LOS: 3 days   I reviewed nursing notes, Consultant oncology, IR notes, hospitalist notes, last 24 h vitals and pain scores, last 48 h intake and output, last 24 h labs and trends, and last 24 h imaging results  Eric Form, [facility] 06/06/2023, 7:56 AM Please see Amion for pager number during day hours 7:00am-4:30pm

## 2023-06-06 NOTE — Progress Notes (Signed)
 Referring Physician(s): Elijah [name]  Supervising Physician: Elijah [name]  Patient Status:  [name] - In-pt  Chief Complaint:   T cell lymphoma, s/p image guided abdominal fluid collection drain placement 3/6   Subjective:  Pt is alert and pleasant, sitting with wife. No concerns with drain placed in IR on 3/6, purulent drainage noted in collection bag, no redness at insertion site, afebrile, no concerns from patient. Flushed catheter at bedside, flushes easily with 5cc NS.  While at bedside, interacted with primary RN and [name] team RN. Primary RN learned this AM that patient had been having pain at site of port-a-cath and in R arm x4 days and contacted [name] team, de-accessed port and found tip of needle not seated in port. See nursing note. IR service not formally consulted about this at this time. [name] team RN to notify IR if additional issues after re-access, aware formal order needed for IR eval.  Allergies: Singulair [montelukast], Amlodipine, and Crestor [rosuvastatin]  Medications: Prior to Admission medications   Medication Sig Start Date End Date Taking? Authorizing Provider  acetaminophen (TYLENOL) 325 MG tablet Take 2 tablets (650 mg total) by mouth every 6 (six) hours as needed. 04/16/23  Yes Elijah Chapel, PA-C  albuterol (VENTOLIN HFA) 108 (90 Base) MCG/ACT inhaler Inhale 2 puffs into the lungs as needed (max of 12 puffs per day) 05/04/23  Yes   alfuzosin (UROXATRAL) 10 MG 24 hr tablet Take 1 tablet (10 mg total) by mouth daily. 01/03/23  Yes   allopurinol (ZYLOPRIM) 300 MG tablet Take 1 tablet (300 mg total) by mouth daily. 05/19/23  Yes Elijah Standard, MD  CINNAMON PO Take 1 capsule by mouth daily.   Yes [provider]  finasteride (PROSCAR) 5 MG tablet Take 1 tablet (5 mg total) by mouth daily. 02/11/23  Yes   lidocaine-prilocaine (EMLA) cream Apply 1 Application topically as needed. 05/19/23  Yes Elijah Standard, MD  loratadine (CLARITIN) 10 MG tablet Take  10 mg by mouth daily.   Yes [provider]  methocarbamol (ROBAXIN) 500 MG tablet Take 2 tablets (1,000 mg total) by mouth every 8 (eight) hours as needed for muscle spasms. 04/16/23  Yes Elijah Chapel, PA-C  minoxidil (LONITEN) 2.5 MG tablet Take 1 tablet (2.5 mg total) by mouth daily. 12/03/22  Yes   ondansetron (ZOFRAN) 8 MG tablet Take 1 tablet (8 mg total) by mouth every 8 (eight) hours as needed. Patient taking differently: Take 8 mg by mouth every 8 (eight) hours as needed for refractory nausea / vomiting. 05/19/23  Yes Elijah Standard, MD  oxyCODONE (OXY IR/ROXICODONE) 5 MG immediate release tablet Take 1-2 tablets (5-10 mg total) by mouth every 6 (six) hours as needed for severe pain (pain score 7-10). 04/23/23  Yes Elijah Standard, MD  pantoprazole (PROTONIX) 40 MG tablet Take 1 tablet (40 mg total) by mouth daily 30 minutes to 1 hour before morning meal. 05/01/23  Yes   predniSONE (DELTASONE) 1 MG tablet Take 4 tablets by mouth daily, along with a 5mg  tablet for a total of 9mg  daily for 1 month. Taper by 1mg  monthly. 05/27/23  Yes [name], Elijah Rouse, MD  Probiotic TBEC Take 1 capsule by mouth daily.   Yes [provider]  prochlorperazine (COMPAZINE) 10 MG tablet Take 1 tablet (10 mg total) by mouth every 6 (six) hours as needed for nausea or vomiting. 05/19/23  Yes Elijah Standard, MD  Accu-Chek Softclix Lancets  lancets Use daily before breakfast. 04/16/23   Elijah Bonds, MD  Blood Glucose Monitoring Suppl (ACCU-CHEK GUIDE) w/Device KIT Use daily before breakfast. 04/16/23   Elijah Bonds, MD  glucose blood (ACCU-CHEK GUIDE TEST) test strip Use daily before breakfast. 04/16/23   Elijah Bonds, MD  Glucose Blood (BLOOD GLUCOSE TEST STRIPS) STRP Use to check blood sugar before breakfast as directed 04/16/23   Elijah Bonds, MD     Vital Signs: BP (!) 164/98 (BP Location: Right Arm)   Pulse 94   Temp 98.6 F (37 C) (Oral)   Resp 16   Ht 6\' 1"  (1.854 m)   Wt 226  lb 13.7 oz (102.9 kg)   SpO2 96%   BMI 29.93 kg/m   Physical Exam Pulmonary:     Effort: Pulmonary effort is normal.  Abdominal:     Palpations: Abdomen is soft.     Tenderness: There is no abdominal tenderness.     Comments: Drain present, purulent drainage present in bag. No redness at insertion site, dressing clean and intact  Skin:    General: Skin is warm and dry.  Neurological:     Mental Status: He is alert and oriented to person, place, and time.  Psychiatric:        Mood and Affect: Mood normal.        Behavior: Behavior normal.      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 MEDICATIONS: The patient is currently admitted to the hospital and receiving intravenous antibiotics. The antibiotics were administered within an appropriate time frame prior to the initiation of the procedure. ANESTHESIA/SEDATION: Moderate (conscious) sedation was employed during this procedure. A total of Versed 4 mg and Fentanyl 100 mcg was administered intravenously. Moderate Sedation Time: 16 minutes. The patient's level of consciousness and vital signs were monitored continuously by radiology nursing throughout the procedure under my direct supervision. CONTRAST:  None FLUOROSCOPY TIME:  CT dose; 1206 mGycm COMPLICATIONS: None immediate. PROCEDURE: RADIATION DOSE REDUCTION: This exam was performed according to the departmental dose-optimization program which includes automated exposure control, adjustment of the mA and/or kV according to patient size and/or use of iterative reconstruction technique. Informed written consent was obtained from the patient after a discussion of the risks, benefits and alternatives to treatment.  The patient was placed supine on the CT gantry and a pre procedural CT was performed re-demonstrating the known abscess/fluid collection within the LEFT abdomen. The procedure was planned. A timeout was performed prior to the initiation of the procedure. The LEFT was prepped and draped in the usual sterile fashion. The overlying soft tissues were anesthetized with 1% lidocaine with epinephrine. Appropriate trajectory was planned with the use of a 22 gauge spinal needle. An 18 gauge trocar needle was advanced into the abscess/fluid collection and a short Amplatz super stiff wire was coiled within the collection. Appropriate positioning was confirmed with a limited CT scan. The tract was serially dilated allowing placement of a 12 Fr drainage catheter. Appropriate positioning was confirmed with a limited postprocedural CT scan. 30 ml of purulent fluid was aspirated. The tube was connected to a drainage bag and sutured in place. A dressing was placed. The patient tolerated the procedure well without immediate post procedural complication. IMPRESSION: Successful CT guided  placement of a 12 Fr drainage catheter into the LEFT lower quadrant abscess with aspiration of 30 mL of purulent fluid. Samples were sent to the laboratory as requested by the ordering clinical team. Roanna Banning, MD [facility] Radiology Electronically Signed   By: Roanna Banning M.D.   On: 06/05/2023 18:27   CT ABDOMEN PELVIS W CONTRAST Result Date: 06/04/2023 CLINICAL DATA:  History of recent small bowel resection with ongoing nausea, initial encounter EXAM: CT ABDOMEN AND PELVIS WITH CONTRAST TECHNIQUE: Multidetector CT imaging of the abdomen and pelvis was performed using the [name] protocol following bolus administration of intravenous contrast. RADIATION DOSE REDUCTION: This exam was performed according to the departmental dose-optimization program which includes automated exposure control,  adjustment of the mA and/or kV according to patient size and/or use of iterative reconstruction technique. CONTRAST:  OMNIPAQUE IOHEXOL 300 MG/ML  SOLN COMPARISON:  05/08/2023 FINDINGS: Lower chest: No acute abnormality. Hepatobiliary: No focal liver abnormality is seen. No gallstones, gallbladder wall thickening, or biliary dilatation. Pancreas: Unremarkable. No pancreatic ductal dilatation or surrounding inflammatory changes. Spleen: Normal in size without focal abnormality. Adrenals/Urinary Tract: Adrenal glands are within normal limits bilaterally. Kidneys demonstrate a normal enhancement pattern. Bilateral renal cysts are noted. No follow-up is recommended. No obstructive changes are seen. The bladder is partially distended. Stomach/Bowel: No obstructive or inflammatory changes of the colon are noted. The appendix is within normal limits. Surgical changes are noted in the small bowel. No obstructive changes are seen. Stomach is within normal limits. Vascular/Lymphatic: Mild vascular calcifications are noted. Scattered small mesenteric lymph nodes are noted. A large centrally necrotic mass lesion is noted within the mesentery to the left of the midline similar to that seen on the prior exam. Air is noted within now consistent with increasing infection. The mass now measures approximately 11.5 x 9.1 cm. Reproductive: Prostate is unremarkable. Other: No abdominal wall hernia or abnormality. No abdominopelvic ascites. Musculoskeletal: No acute or significant osseous findings. IMPRESSION: Large centrally necrotic mass lesion is again identified within the mesentery. Increased air is noted within likely related to super infection. Previously seen surgical drain has been removed. No other focal abnormality is noted. Electronically Signed   By: Alcide Clever M.D.   On: 06/04/2023 01:42   DG CHEST PORT 1 VIEW Result Date: 06/03/2023 CLINICAL DATA:  Neutropenic fever EXAM: PORTABLE CHEST 1 VIEW COMPARISON:   05/02/2023 PET-CT FINDINGS: Right chest wall port is noted in satisfactory position. Cardiac shadow is within normal limits. The lungs are well aerated without focal infiltrate. No bony abnormality is seen. IMPRESSION: No acute abnormality noted. Electronically Signed   By: Alcide Clever M.D.   On: 06/03/2023 21:09    Labs:  CBC: Recent Labs    06/03/23 1050 06/04/23 0443 06/05/23 0510 06/06/23 0458  WBC 4.6 12.5* 18.6* 19.0*  HGB 11.8* 10.2* 9.6* 10.0*  HCT 36.6* 32.9* 31.3* 32.3*  PLT 250 201 189 190    COAGS: Recent Labs    06/05/23 1200  INR 1.1    BMP: Recent Labs    06/03/23 1050 06/04/23 0443 06/05/23 0510 06/06/23 0458  NA 140 136 140 141  K 3.7 3.8 3.2* 3.4*  CL 104 105 106 106  CO2 25 21* 23 25  GLUCOSE 169* 119* 139* 128*  BUN 23 18 14 14   CALCIUM 9.3 8.4* 8.6* 8.1*  CREATININE 1.27* 0.96 0.87 0.94  GFRNONAA >60 >60 >60 >60    LIVER FUNCTION TESTS: Recent Labs  04/23/23 1543 05/19/23 0949 05/26/23 1346 06/03/23 1050  BILITOT 0.3 0.4 0.3 0.5  AST 16 14* 14* 13*  ALT 27 20 16 22   ALKPHOS 74 49 57 66  PROT 7.3 6.6 7.1 6.8  ALBUMIN 3.8 3.8 3.8 3.7    Assessment and Plan:  Elijah [name] is a 68 y.o. 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 Recent admission 1/9 to 1/16 for small bowel mass with perforation; ex lap, SBR 04/10/23 by Dr. Audrie Lia.    Mesenteric Mass  -CT 3/5: Large centrally necrotic mass lesion is again identified within the mesentery. Increased air is noted within likely related to super infection. Previously seen surgical drain has been removed. -3/6 IR: Successful CT guided placement of a 12 Fr drainage catheter into the LEFT lower quadrant abscess with aspiration of 30 mL of purulent fluid. Samples were sent to the laboratory as requested by the ordering clinical team - No concerns with drain placed in IR on 3/6, purulent drainage noted in  collection bag, no redness at insertion site, afebrile, no concerns from patient. Flushed catheter at bedside, flushes easily with 5cc NS. WBC 19 (18.6 on 3/6 pre-procedure), hgb 10. Drain Location: LLQ Size: Fr size: 12 Fr Date of placement: 06/06/23  Currently to: Drain collection device: gravity 24 hour output:  Output by Drain (mL) 06/04/23 0701 - 06/04/23 1900 06/04/23 1901 - 06/05/23 0700 06/05/23 0701 - 06/05/23 1900 06/05/23 1901 - 06/06/23 0700 06/06/23 0701 - 06/06/23 1517  Closed System Drain 1 LLQ Other (Comment) 12 Fr.   40 150 50    Current examination: Flushes/aspirates easily.  Insertion site unremarkable. Suture and stat lock in place. Dressed appropriately.   Plan: Continue TID flushes with 5 cc NS. Record output Q shift. Dressing changes QD or PRN if soiled.  Call IR APP or on call IR MD if difficulty flushing or sudden change in drain output.  Repeat imaging/possible drain injection once output < 10 mL/QD (excluding flush material). Consideration for drain removal if output is < 10 mL/QD (excluding flush material), pending discussion with the providing surgical service.  Discharge planning: Please contact IR APP or on call IR MD prior to patient d/c to ensure appropriate follow up plans are in place. Typically patient will follow up with IR clinic 10-14 days post d/c for repeat imaging/possible drain injection. IR scheduler will contact patient with date/time of appointment. Patient will need to flush drain QD with 5 cc NS, record output QD, dressing changes every 2-3 days or earlier if soiled.   IR will continue to follow - please call with questions or concerns.     Thank you for allowing our service to participate in Elijah [name] 's care.   Electronically Signed: Carlton Adam, NP 06/06/2023, 12:23 PM   I spent a total of 15 Minutes at the the patient's bedside AND on the patient's hospital floor or unit, greater than 50% of which was  counseling/coordinating care for CT guided placement of a 12 Fr drainage catheter into abdominal fluid collection

## 2023-06-06 NOTE — Plan of Care (Signed)
   Problem: Education: Goal: Knowledge of General Education information will improve Description: Including pain rating scale, medication(s)/side effects and non-pharmacologic comfort measures Outcome: Progressing   Problem: Health Behavior/Discharge Planning: Goal: Ability to manage health-related needs will improve Outcome: Progressing   Problem: Clinical Measurements: Goal: Ability to maintain clinical measurements within normal limits will improve Outcome: Progressing Goal: Will remain free from infection Outcome: Progressing Goal: Diagnostic test results will improve Outcome: Progressing Goal: Respiratory complications will improve Outcome: Progressing Goal: Cardiovascular complication will be avoided Outcome: Progressing   Problem: Activity: Goal: Risk for activity intolerance will decrease Outcome: Progressing   Problem: Nutrition: Goal: Adequate nutrition will be maintained Outcome: Progressing   Problem: Coping: Goal: Level of anxiety will decrease Outcome: Progressing   Problem: Elimination: Goal: Will not experience complications related to bowel motility Outcome: Progressing Goal: Will not experience complications related to urinary retention Outcome: Progressing   Problem: Pain Managment: Goal: General experience of comfort will improve and/or be controlled Outcome: Progressing   Problem: Safety: Goal: Ability to remain free from injury will improve Outcome: Progressing   Problem: Skin Integrity: Goal: Risk for impaired skin integrity will decrease Outcome: Progressing   Problem: Education: Goal: Ability to describe self-care measures that may prevent or decrease complications (Diabetes Survival Skills Education) will improve Outcome: Progressing Goal: Individualized Educational Video(s) Outcome: Progressing   Problem: Coping: Goal: Ability to adjust to condition or change in health will improve Outcome: Progressing   Problem: Fluid  Volume: Goal: Ability to maintain a balanced intake and output will improve Outcome: Progressing   Problem: Health Behavior/Discharge Planning: Goal: Ability to identify and utilize available resources and services will improve Outcome: Progressing Goal: Ability to manage health-related needs will improve Outcome: Progressing   Problem: Metabolic: Goal: Ability to maintain appropriate glucose levels will improve Outcome: Progressing   Problem: Nutritional: Goal: Maintenance of adequate nutrition will improve Outcome: Progressing Goal: Progress toward achieving an optimal weight will improve Outcome: Progressing   Problem: Skin Integrity: Goal: Risk for impaired skin integrity will decrease Outcome: Progressing   Problem: Tissue Perfusion: Goal: Adequacy of tissue perfusion will improve Outcome: Progressing   Problem: Education: Goal: Ability to describe self-care measures that may prevent or decrease complications (Diabetes Survival Skills Education) will improve Outcome: Progressing Goal: Individualized Educational Video(s) Outcome: Progressing   Problem: Coping: Goal: Ability to adjust to condition or change in health will improve Outcome: Progressing   Problem: Fluid Volume: Goal: Ability to maintain a balanced intake and output will improve Outcome: Progressing   Problem: Health Behavior/Discharge Planning: Goal: Ability to identify and utilize available resources and services will improve Outcome: Progressing Goal: Ability to manage health-related needs will improve Outcome: Progressing   Problem: Metabolic: Goal: Ability to maintain appropriate glucose levels will improve Outcome: Progressing   Problem: Nutritional: Goal: Maintenance of adequate nutrition will improve Outcome: Progressing Goal: Progress toward achieving an optimal weight will improve Outcome: Progressing   Problem: Skin Integrity: Goal: Risk for impaired skin integrity will  decrease Outcome: Progressing   Problem: Tissue Perfusion: Goal: Adequacy of tissue perfusion will improve Outcome: Progressing

## 2023-06-06 NOTE — Progress Notes (Signed)
 IV team called at the bedside. Patient complains of discomfort at port site X4 days. Patient requests needle out and peripheral IV inserted. Upon assessing chest port, this RN found the needle inserted an inch below the actual port site septum. Dressing labeled 06/05/23, intact, no signs of manipulation. This RN Promptly removed needle with NP and IV team Nurse Vernona Rieger at the bedside, IV team suggests to re access per protocol. Patient requests icing site before re accessing and nurse provided it.

## 2023-06-07 ENCOUNTER — Other Ambulatory Visit (HOSPITAL_COMMUNITY): Payer: Self-pay

## 2023-06-07 DIAGNOSIS — R5081 Fever presenting with conditions classified elsewhere: Secondary | ICD-10-CM | POA: Diagnosis not present

## 2023-06-07 DIAGNOSIS — D709 Neutropenia, unspecified: Secondary | ICD-10-CM | POA: Diagnosis not present

## 2023-06-07 LAB — BASIC METABOLIC PANEL
Anion gap: 8 (ref 5–15)
BUN: 12 mg/dL (ref 8–23)
CO2: 25 mmol/L (ref 22–32)
Calcium: 8.5 mg/dL — ABNORMAL LOW (ref 8.9–10.3)
Chloride: 103 mmol/L (ref 98–111)
Creatinine, Ser: 0.85 mg/dL (ref 0.61–1.24)
GFR, Estimated: >60 mL/min (ref >60)
Glucose, Bld: 142 mg/dL — ABNORMAL HIGH (ref 70–99)
Potassium: 3.3 mmol/L — ABNORMAL LOW (ref 3.5–5.1)
Sodium: 136 mmol/L (ref 135–145)

## 2023-06-07 LAB — CBC WITH DIFFERENTIAL/PLATELET
Abs Immature Granulocytes: 4.44 10*3/uL — ABNORMAL HIGH (ref 0.00–0.07)
Basophils Absolute: 0 10*3/uL (ref 0.0–0.1)
Basophils Relative: 0 %
Eosinophils Absolute: 0.1 10*3/uL (ref 0.0–0.5)
Eosinophils Relative: 0 %
HCT: 31.8 % — ABNORMAL LOW (ref 39.0–52.0)
Hemoglobin: 10 g/dL — ABNORMAL LOW (ref 13.0–17.0)
Immature Granulocytes: 20 %
Lymphocytes Relative: 6 %
Lymphs Abs: 1.3 10*3/uL (ref 0.7–4.0)
MCH: 28.1 pg (ref 26.0–34.0)
MCHC: 31.4 g/dL (ref 30.0–36.0)
MCV: 89.3 fL (ref 80.0–100.0)
Monocytes Absolute: 1.3 10*3/uL — ABNORMAL HIGH (ref 0.1–1.0)
Monocytes Relative: 6 %
Neutro Abs: 15.1 10*3/uL — ABNORMAL HIGH (ref 1.7–7.7)
Neutrophils Relative %: 68 %
Platelets: 202 10*3/uL (ref 150–400)
RBC: 3.56 MIL/uL — ABNORMAL LOW (ref 4.22–5.81)
RDW: 15.1 % (ref 11.5–15.5)
WBC: 22.2 10*3/uL — ABNORMAL HIGH (ref 4.0–10.5)
nRBC: 0.4 % — ABNORMAL HIGH (ref 0.0–0.2)

## 2023-06-07 LAB — GLUCOSE, CAPILLARY: Glucose-Capillary: 145 mg/dL — ABNORMAL HIGH (ref 70–99)

## 2023-06-07 LAB — MAGNESIUM: Magnesium: 1.9 mg/dL (ref 1.7–2.4)

## 2023-06-07 LAB — PHOSPHORUS: Phosphorus: 4.3 mg/dL (ref 2.5–4.6)

## 2023-06-07 MED ORDER — HEPARIN SOD (PORK) LOCK FLUSH 100 UNIT/ML IV SOLN
500.0000 [IU] | Freq: Once | INTRAVENOUS | Status: AC
  Administered 2023-06-07: 500 [IU] via INTRAVENOUS
  Filled 2023-06-07: qty 5

## 2023-06-07 MED ORDER — POTASSIUM CHLORIDE 20 MEQ PO PACK
40.0000 meq | PACK | Freq: Once | ORAL | Status: AC
  Administered 2023-06-07: 40 meq via ORAL
  Filled 2023-06-07: qty 2

## 2023-06-07 MED ORDER — AMOXICILLIN-POT CLAVULANATE 875-125 MG PO TABS
1.0000 | ORAL_TABLET | Freq: Two times a day (BID) | ORAL | 0 refills | Status: DC
  Filled 2023-06-07: qty 20, 10d supply, fill #0

## 2023-06-07 NOTE — Progress Notes (Signed)
 TOC meds in a secure bag delivered by this RN to pt in room

## 2023-06-07 NOTE — Plan of Care (Addendum)
 RN asked pt and pt wife on woundcare/dressing changing training  Pt wife states "I do not need woundcare and dressing changing instructions. I know how to do it."  ------------ Pt and pt wife received, educated on, and understands discharge instructions.  Woundcare orders in AVS instructions.  Pt and pt wife educated on and understands flushing drain.  Supplies for woundcare/flushing given to pt and pt wife in case HH does not come in time for daily changes.

## 2023-06-07 NOTE — Progress Notes (Signed)
 Subjective/Chief Complaint: Patient with mild pain at the drain insertion site.  Otherwise no complaints.   Objective: Vital signs in last 24 hours: Temp:  [98.2 F (36.8 C)-99.4 F (37.4 C)] 98.6 F (37 C) (03/08 0521) Pulse Rate:  [88-104] 88 (03/08 0521) Resp:  [18-20] 20 (03/08 0521) BP: (142-173)/(88-96) 163/96 (03/08 0521) SpO2:  [95 %-96 %] 95 % (03/08 0521) Last BM Date : 06/06/23  Intake/Output from previous day: 03/07 0701 - 03/08 0700 In: 385 [P.O.:360; I.V.:20] Out: 60 [Drains:60] Intake/Output this shift: No intake/output data recorded.  Abdomen: Midline wound is clean and dressed.  Drain site left lower quadrant intact.  No erythema.  Drain output shows thick yellow liquid.  60 cc recorded overnight.  No peritonitis.  Lab Results:  Recent Labs    06/06/23 0458 06/07/23 0534  WBC 19.0* 22.2*  HGB 10.0* 10.0*  HCT 32.3* 31.8*  PLT 190 202   BMET Recent Labs    06/06/23 0458 06/07/23 0534  NA 141 136  K 3.4* 3.3*  CL 106 103  CO2 25 25  GLUCOSE 128* 142*  BUN 14 12  CREATININE 0.94 0.85  CALCIUM 8.1* 8.5*   PT/INR Recent Labs    06/05/23 1200  LABPROT 14.0  INR 1.1   ABG No results for input(s): "PHART", "HCO3" in the last 72 hours.  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 MEDICATIONS: The patient is currently admitted to the hospital and receiving intravenous antibiotics. The antibiotics were administered within an appropriate time frame prior to the initiation of the procedure. ANESTHESIA/SEDATION: Moderate (conscious) sedation was employed during this procedure. A total of Versed 4 mg and Fentanyl 100 mcg was  administered intravenously. Moderate Sedation Time: 16 minutes. The patient's level of consciousness and vital signs were monitored continuously by radiology nursing throughout the procedure under my direct supervision. CONTRAST:  None FLUOROSCOPY TIME:  CT dose; 1206 mGycm COMPLICATIONS: None immediate. PROCEDURE: RADIATION DOSE REDUCTION: This exam was performed according to the departmental dose-optimization program which includes automated exposure control, adjustment of the mA and/or kV according to patient size and/or use of iterative reconstruction technique. Informed written consent was obtained from the patient after a discussion of the risks, benefits and alternatives to treatment. The patient was placed supine on the CT gantry and a pre procedural CT was performed re-demonstrating the known abscess/fluid collection within the LEFT abdomen. The procedure was planned. A timeout was performed prior to the initiation of the procedure. The LEFT was prepped and draped in the usual sterile fashion. The overlying soft tissues were anesthetized with 1% lidocaine with epinephrine. Appropriate trajectory was planned with the use of a 22 gauge spinal needle. An 18 gauge trocar needle was advanced into the abscess/fluid collection and a short Amplatz super stiff wire was coiled within the collection. Appropriate positioning was confirmed with a limited CT scan. The tract was serially dilated allowing placement of a 12 Fr drainage catheter. Appropriate positioning was confirmed with a limited postprocedural CT scan. 30 ml of purulent fluid was aspirated. The tube was connected to a drainage bag and sutured in place. A dressing was placed. The patient tolerated the procedure well without immediate post procedural complication. IMPRESSION: Successful CT guided placement of a 12 Fr  drainage catheter into the LEFT lower quadrant abscess with aspiration of 30 mL of purulent fluid. Samples were sent to the laboratory as  requested by the ordering clinical team. Roanna Banning, MD [facility] Electronically Signed   By: Roanna Banning M.D.   On: 06/05/2023 18:27    Anti-infectives: Anti-infectives (From admission, onward)    Start     Dose/Rate Route Frequency Ordered Stop   06/05/23 0945  cefTRIAXone (ROCEPHIN) 2 g in sodium chloride 0.9 % 100 mL IVPB        2 g 200 mL/hr over 30 Minutes Intravenous Every 24 hours 06/05/23 0853     06/04/23 0930  metroNIDAZOLE (FLAGYL) IVPB 500 mg        500 mg 100 mL/hr over 60 Minutes Intravenous Every 12 hours 06/04/23 0846     06/03/23 2000  vancomycin (VANCOREADY) IVPB 1500 mg/300 mL  Status:  Discontinued        1,500 mg 150 mL/hr over 120 Minutes Intravenous Daily 06/03/23 1819 06/04/23 0846   06/03/23 2000  ceFEPIme (MAXIPIME) 2 g in sodium chloride 0.9 % 100 mL IVPB  Status:  Discontinued        2 g 200 mL/hr over 30 Minutes Intravenous Every 8 hours 06/03/23 1819 06/05/23 0853       Assessment/Plan:  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 Hillery Hunter - known positive tumor margins with evidence of tumor necrosis of the mesentery with possible superinfection and possible communication with bowel   - s/p IR drain placement 3/6 with 190 ml out so far. Gram stain with gram pos cocci, gram neg rods, gram pos rods. Culture pending - afebrile and HDS with WBC 19 (18.6) on IV abx in setting of steroid use and recent neulasta injection - recommend ongoing abx and monitoring drain output quantity and character for next 24 hours at least - inc bowel regimen   FEN: reg ID: vanc. cefepime. rocephin/flagyl>> VTE: LMWH okay from our standpoint \   Patient surgically stable.  Recommend continued antibiotics and these can be converted from IV to p.o.  Stable for discharge from a surgical standpoint.  Will arrange for follow-up with Dr. Hillery Hunter in the next  couple weeks.  Reviewed drain care with the patient and his wife at the bedside.      LOS: 4 days    Dortha Schwalbe MD  [facility]  06/07/2023

## 2023-06-07 NOTE — TOC Initial Note (Signed)
 Transition of Care [facility]) - Initial/Assessment Note    Patient Details  Name: Elijah [name] MRN: [id number] Date of Birth: 1955-10-12  [facility] CM/SW Contact:    Beckie Busing, RN Phone Number:[phone]  06/07/2023, 11:32 AM  Clinical Narrative:                 TOC following patient expected to discharge home. CM at bedside to offer choice for home health services. Patient and wife states that they would like to resume home health services with [facility]. Orders have been places info has been given to [place] with [facility]. CM has informed wife that she will be responsible for daily dressing changes and that [facility] will set up resumption of services. Wife verbalized understanding.   Expected Discharge Plan: Home w Home Health Services Barriers to Discharge: No Barriers Identified   Patient Goals and CMS Choice Patient states their goals for this hospitalization and ongoing recovery are:: Ready to go home   Choice offered to / list presented to : NA [place] ownership interest in [facility].provided to::  (n/a)    Expected Discharge Plan and Services In-house Referral: NA Discharge Planning Services: CM Consult Post Acute Care Choice: Home Health (wishes to resume with currentl home health agency) Living arrangements for the past 2 months: Single Family Home Expected Discharge Date: 06/07/23               DME Arranged: N/A         HH Arranged: PT, RN HH Agency: [facility] Date [facility] Agency Contacted: 06/07/23 Time HH Agency Contacted: [id number] Representative spoke with at [facility] Agency: Kandee Keen  Prior Living Arrangements/Services Living arrangements for the past 2 months: Single Family Home Lives with:: Spouse Patient language and need for interpreter reviewed:: Yes Do you feel safe going back to the place where you live?: Yes      Need for Family Participation in Patient Care: Yes (Comment) Care giver support system in place?: Yes  (comment) Current home services: Home RN Criminal Activity/Legal Involvement Pertinent to Current Situation/Hospitalization: No - Comment as needed  Activities of Daily Living   ADL Screening (condition at time of admission) Independently performs ADLs?: Yes (appropriate for developmental age) Is the patient deaf or have difficulty hearing?: No Does the patient have difficulty seeing, even when wearing glasses/contacts?: No Does the patient have difficulty concentrating, remembering, or making decisions?: No  Permission Sought/Granted Permission sought to share information with : Family Supports Permission granted to share information with : Yes, Verbal Permission Granted  Share Information with NAME: Raven Harmes     Permission granted to share info w Relationship: spouse  Permission granted to share info w Contact Information: [phone]  Emotional Assessment Appearance:: Appears stated age Attitude/Demeanor/Rapport: Engaged, Gracious Affect (typically observed): Pleasant, Quiet Orientation: : Oriented to Self, Oriented to Place, Oriented to  Time, Oriented to Situation Alcohol / Substance Use: Not Applicable Psych Involvement: No (comment)  Admission diagnosis:  Neutropenic fever (HCC) [D70.9, R50.81] Patient Active Problem List   Diagnosis Date Noted   PMR (polymyalgia rheumatica) (HCC) 06/03/2023   BPH (benign prostatic hyperplasia) 06/03/2023   AKI (acute kidney injury) (HCC) 06/03/2023   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 [place] [address] Phone: [phone] Fax: [phone]     Social Drivers of Health (SDOH) Social History: SDOH Screenings   Food Insecurity: No Food Insecurity (06/03/2023)  Housing: Low Risk  (06/03/2023)  Transportation Needs: No Transportation Needs (06/03/2023)  Utilities: Not At Risk (06/03/2023)  Social Connections: Socially Integrated (06/04/2023)  Tobacco Use: Low Risk  (06/04/2023)   SDOH Interventions:     Readmission Risk Interventions    06/07/2023   11:21 AM  Readmission Risk Prevention Plan  Transportation Screening Complete  PCP or Specialist Appt within 3-5 Days Complete  HRI or Home Care Consult Complete  Social Work Consult for Recovery Care Planning/Counseling Complete  Palliative Care Screening Complete  Medication Review Oceanographer) Referral to Pharmacy

## 2023-06-07 NOTE — Discharge Summary (Signed)
 Physician Discharge Summary  Elijah [name] ZOX:[id number] DOB: [birth year] DOA: 06/03/2023  PCP: Tally Joe, MD  Admit date: 06/03/2023  Discharge date: 06/07/2023  Admitted From: Home  Disposition:  Home with Home Health:  Recommendations for Outpatient Follow-up:  Follow up with PCP in 1-2 weeks. Please obtain BMP/CBC in one week. Advised to follow-up with general surgery as scheduled. Advised to follow-up with IR for percutaneous drain. Advised to take Augmentin twice a day for 10 days.  Home Health:None Equipment/Devices:PTC drain  Discharge Condition: Stable CODE STATUS:Full code Diet recommendation: Heart Healthy   Brief [facility] Course: This 68 year old Male with history of recently diagnosed T-cell lymphoma,  started chemotherapy last week, GERD, BPH, PMNR, HLD, prediabetes, OSA presented from the cancer clinic for dizziness.  Patient was found to be febrile, hypotensive and tachycardic.  Initially broad-spectrum antibiotics were started and admitted for SIRS. Workup reveals left lower quadrant abscess. IR consulted, underwent percutaneous biliary drain.  He was continued on pain medications and IV antibiotics,  cultures still pending.  Patient has significant output in the drain.  Family was taught to take care of the drain and do dressing.  Patient cleared from general surgery to be discharged on Augmentin for 10 days.  Advised follow-up with general surgery in 2 weeks. Home health services arranged.  He was admitted in January for perforated small bowel and was diagnosed with lymphoma requiring small bowel resection.   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 He was recently diagnosed of T-cell lymphoma requiring small bowel resection in January secondary to perforation.   First round of chemo started on 05/26/23. This patient does not have neutropenia. COVID/flu/RSV-negative Respiratory panel still pending.  Cultures from the clinic are negative, UA is negative.   Chest x-ray is negative CT abdomen pelvis shows large previously noted mesenteric necrotic lesion with some evidence of air with possible superimposed infection.  Discussed with general surgery.  Due to rising WBC, plans for IR drain placement.  Antibiotics changed to Rocephin and Flagyl.  Leukocytosis possibly related to prednisone vs infection.  Patient underwent successful left lower quadrant abscess percutaneous drain. Patient has significant output in the drain.  Family was taught how to take care of the drain and do dressing.   Patient cleared from general surgery to be discharged on Augmentin for 10 days.   Advised follow-up with general surgery in 2 weeks. Home health services arranged.   Hypokalemia: Replaced. Continue to monitor.   AKI (acute kidney injury) (HCC), resolved Admission creatinine 1.2, now at baseline 0.9   Steroid-induced diabetes (HCC) On chronic prednisone. Continue  Sliding scale and Accu-Cheks   PMR (polymyalgia rheumatica) (HCC) Continue prednisone 15mg  daily (tapering down).   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 Leonides Schanz   Gastroesophageal reflux disease without esophagitis: Continue protonix 40 mg daily    BPH (benign prostatic hyperplasia) Continue finasteride and alfuzosin,   Obstructive sleep apnea Cpap nightly, does not want while inpatient  Discharge Instructions  Discharge Instructions     Call MD for:  difficulty breathing, headache or visual disturbances   Complete by: As  directed    Call MD for:  persistant dizziness or light-headedness   Complete by: As directed    Call MD for:  persistant nausea and vomiting   Complete by: As directed    Diet - low sodium heart healthy   Complete by: As directed    Diet Carb Modified   Complete by: As directed    Discharge instructions   Complete by: As directed    Advised to follow-up with primary care physician in 1 week. Advised to follow-up with general surgery as scheduled. Advised to follow-up with IR for percutaneous drain. Advised to take Augmentin twice a day for 10 days.   Discharge wound care:   Complete by: As directed    Continue primary care physician.   Increase activity slowly   Complete by: As directed       Allergies as of 06/07/2023       Reactions   Singulair [montelukast] Hives   Amlodipine Other (See Comments)   agitation   Crestor [rosuvastatin] Other (See Comments)   Muscle Aches         Medication List     STOP taking these medications    acetaminophen 325 MG tablet Commonly known as: TYLENOL   ondansetron 8 MG tablet Commonly known as: ZOFRAN       TAKE these medications    Accu-Chek Guide Test test strip Generic drug: glucose blood Use to check blood sugar before breakfast as directed   Accu-Chek Guide Test test strip Generic drug: glucose blood Use daily before breakfast.   Accu-Chek Guide w/Device Kit Use daily before breakfast.   Accu-Chek Softclix Lancets lancets Use daily before breakfast.   albuterol 108 (90 Base) MCG/ACT inhaler Commonly known as: VENTOLIN HFA Inhale 2 puffs into the lungs as needed (max of 12 puffs per day)   alfuzosin 10 MG 24 hr tablet Commonly known as: Uroxatral Take 1 tablet (10 mg total) by mouth daily.   allopurinol 300 MG tablet Commonly known as: ZYLOPRIM Take 1 tablet (300 mg total) by mouth daily.   amoxicillin-clavulanate 875-125 MG tablet Commonly known as: AUGMENTIN Take 1 tablet by mouth 2 (two) times  daily for 10 days.   CINNAMON PO Take 1 capsule by mouth daily.   finasteride 5 MG tablet Commonly known as: PROSCAR Take 1 tablet (5 mg total) by mouth daily.   lidocaine-prilocaine cream Commonly known as: EMLA Apply 1 Application topically as needed.   loratadine 10 MG tablet Commonly known as: CLARITIN Take 10 mg by mouth daily.   methocarbamol 500 MG tablet Commonly known as: ROBAXIN Take 2 tablets (1,000 mg total) by mouth every 8 (eight) hours as needed for muscle spasms.   minoxidil 2.5 MG tablet Commonly known as: LONITEN Take 1 tablet (2.5 mg total) by mouth daily.   oxyCODONE 5 MG immediate release tablet Commonly known as: Oxy IR/ROXICODONE Take 1-2 tablets (5-10 mg total) by mouth every 6 (six) hours as needed for severe pain (pain score 7-10).   pantoprazole 40 MG tablet Commonly known as: PROTONIX Take 1 tablet (40 mg total) by mouth daily 30 minutes to 1 hour before morning meal.   predniSONE 1 MG tablet Commonly known as: DELTASONE Take 4 tablets by mouth daily, along with a 5mg  tablet for a total of 9mg  daily for 1 month. Taper by 1mg  monthly.   Probiotic Tbec Take 1 capsule by mouth daily.   prochlorperazine 10 MG tablet Commonly known as: COMPAZINE Take 1 tablet (10  mg total) by mouth every 6 (six) hours as needed for nausea or vomiting.               Discharge Care Instructions  (From admission, onward)           Start     Ordered   06/07/23 0000  Discharge wound care:       Comments: Continue primary care physician.   06/07/23 1018            Follow-up Information     Maczis, Hedda Slade, PA-C. Go on 06/16/2023.   Specialty: General Surgery Why: We are making a follow up appointment for you., Please call to confirm appointment time., Arrive 15 minutes early to complete check in, and bring photo ID and insurance card. Contact information: [address] [place] [address] [facility] [place] [facility] [place]  [address] [phone]         Tally Joe, MD Follow up in 1 week(s).   Specialty: Family Medicine Contact information: [phone] [address] [place] [address] [phone]         Care, [facility] Follow up.   Specialty: Home Health Services Why: Your home health services will resume with [facility]. The office will call you with resumption of service information. Contact information: [address] [place] [address] [phone]                Allergies  Allergen Reactions   Singulair [Montelukast] Hives   Amlodipine Other (See Comments)    agitation   Crestor [Rosuvastatin] Other (See Comments)    Muscle Aches     Consultations: General Surgery IR   Procedures/Studies: CT GUIDED PERITONEAL/RETROPERITONEAL FLUID DRAIN BY PERC CATH Result Date: 06/05/2023 INDICATION: [id number] Abdominal fluid collection [phone] Briefly, history of T-cell lymphoma with necrotic mass at LEFT lower quadrant EXAM: CT-GUIDED DRAINAGE CATHETER PLACEMENT INTO LEFT ABDOMINAL ABSCESS COMPARISON:  CT AP, 06/03/2023. MEDICATIONS: The patient is currently admitted to the hospital and receiving intravenous antibiotics. The antibiotics were administered within an appropriate time frame prior to the initiation of the procedure. ANESTHESIA/SEDATION: Moderate (conscious) sedation was employed during this procedure. A total of Versed 4 mg and Fentanyl 100 mcg was administered intravenously. Moderate Sedation Time: 16 minutes. The patient's level of consciousness and vital signs were monitored continuously by radiology nursing throughout the procedure under my direct supervision. CONTRAST:  None FLUOROSCOPY TIME:  CT dose; 1206 mGycm COMPLICATIONS: None immediate. PROCEDURE: RADIATION DOSE REDUCTION: This exam was performed according to the departmental dose-optimization program which includes automated exposure control, adjustment of the mA and/or kV according to patient size  and/or use of iterative reconstruction technique. Informed written consent was obtained from the patient after a discussion of the risks, benefits and alternatives to treatment. The patient was placed supine on the CT gantry and a pre procedural CT was performed re-demonstrating the known abscess/fluid collection within the LEFT abdomen. The procedure was planned. A timeout was performed prior to the initiation of the procedure. The LEFT was prepped and draped in the usual sterile fashion. The overlying soft tissues were anesthetized with 1% lidocaine with epinephrine. Appropriate trajectory was planned with the use of a 22 gauge spinal needle. An 18 gauge trocar needle was advanced into the abscess/fluid collection and a short Amplatz super stiff wire was coiled within the collection. Appropriate positioning was confirmed with a limited CT scan. The tract was serially dilated allowing placement of a 12 Fr drainage catheter. Appropriate positioning  was confirmed with a limited postprocedural CT scan. 30 ml of purulent fluid was aspirated. The tube was connected to a drainage bag and sutured in place. A dressing was placed. The patient tolerated the procedure well without immediate post procedural complication. IMPRESSION: Successful CT guided placement of a 12 Fr drainage catheter into the LEFT lower quadrant abscess with aspiration of 30 mL of purulent fluid. Samples were sent to the laboratory as requested by the ordering clinical team. Roanna Banning, MD [facility] Radiology Electronically Signed   By: Roanna Banning M.D.   On: 06/05/2023 18:27   CT ABDOMEN PELVIS W CONTRAST Result Date: 06/04/2023 CLINICAL DATA:  History of recent small bowel resection with ongoing nausea, initial encounter EXAM: CT ABDOMEN AND PELVIS WITH CONTRAST TECHNIQUE: Multidetector CT imaging of the abdomen and pelvis was performed using the standard protocol following bolus administration  of intravenous contrast. RADIATION DOSE REDUCTION: This exam was performed according to the departmental dose-optimization program which includes automated exposure control, adjustment of the mA and/or kV according to patient size and/or use of iterative reconstruction technique. CONTRAST:  OMNIPAQUE IOHEXOL 300 MG/ML  SOLN COMPARISON:  05/08/2023 FINDINGS: Lower chest: No acute abnormality. Hepatobiliary: No focal liver abnormality is seen. No gallstones, gallbladder wall thickening, or biliary dilatation. Pancreas: Unremarkable. No pancreatic ductal dilatation or surrounding inflammatory changes. Spleen: Normal in size without focal abnormality. Adrenals/Urinary Tract: Adrenal glands are within normal limits bilaterally. Kidneys demonstrate a normal enhancement pattern. Bilateral renal cysts are noted. No follow-up is recommended. No obstructive changes are seen. The bladder is partially distended. Stomach/Bowel: No obstructive or inflammatory changes of the colon are noted. The appendix is within normal limits. Surgical changes are noted in the small bowel. No obstructive changes are seen. Stomach is within normal limits. Vascular/Lymphatic: Mild vascular calcifications are noted. Scattered small mesenteric lymph nodes are noted. A large centrally necrotic mass lesion is noted within the mesentery to the left of the midline similar to that seen on the prior exam. Air is noted within now consistent with increasing infection. The mass now measures approximately 11.5 x 9.1 cm. Reproductive: Prostate is unremarkable. Other: No abdominal wall hernia or abnormality. No abdominopelvic ascites. Musculoskeletal: No acute or significant osseous findings. IMPRESSION: Large centrally necrotic mass lesion is again identified within the mesentery. Increased air is noted within likely related to super infection. Previously seen surgical drain has been removed. No other focal abnormality is noted. Electronically Signed    By: Alcide Clever M.D.   On: 06/04/2023 01:42   DG CHEST PORT 1 VIEW Result Date: 06/03/2023 CLINICAL DATA:  Neutropenic fever EXAM: PORTABLE CHEST 1 VIEW COMPARISON:  05/02/2023 PET-CT FINDINGS: Right chest wall port is noted in satisfactory position. Cardiac shadow is within normal limits. The lungs are well aerated without focal infiltrate. No bony abnormality is seen. IMPRESSION: No acute abnormality noted. Electronically Signed   By: Alcide Clever M.D.   On: 06/03/2023 21:09   IR IMAGING GUIDED PORT INSERTION Result Date: 05/13/2023 INDICATION: port for chemo.  History of T-cell lymphoma. EXAM: IMPLANTED PORT A CATH PLACEMENT WITH ULTRASOUND AND FLUOROSCOPIC GUIDANCE MEDICATIONS: Ancef 2 gm IV; The antibiotic was administered within an appropriate time interval prior to skin puncture. ANESTHESIA/SEDATION: Moderate (conscious) sedation was employed during this procedure. A total of Versed 3.5 mg and Fentanyl 100 mcg was administered intravenously. Moderate Sedation Time: 16 minutes. The patient's level of consciousness and vital signs were monitored continuously by radiology nursing throughout the  procedure under my direct supervision. FLUOROSCOPY TIME:  Fluoroscopic dose; 0 mGy COMPLICATIONS: None immediate. PROCEDURE: The procedure, risks, benefits, and alternatives were explained to the patient. Questions regarding the procedure were encouraged and answered. The patient understands and consents to the procedure. The RIGHT neck and chest were prepped with chlorhexidine in a sterile fashion, and a sterile drape was applied covering the operative field. Maximum barrier sterile technique with sterile gowns and gloves were used for the procedure. A timeout was performed prior to the initiation of the procedure. Local anesthesia was provided with 1% lidocaine with epinephrine. After creating a small venotomy incision, a micropuncture kit was utilized to access the internal jugular vein under direct, real-time  ultrasound guidance. Ultrasound image documentation was performed. The microwire was kinked to measure appropriate catheter length. A subcutaneous port pocket was then created along the upper chest wall utilizing a combination of sharp and blunt dissection. The pocket was irrigated with sterile saline. A single lumen power injectable port was chosen for placement. The 8 Fr catheter was tunneled from the port pocket site to the venotomy incision. The port was placed in the pocket. The external catheter was trimmed to appropriate length. At the venotomy, an 8 Fr peel-away sheath was placed over a guidewire under fluoroscopic guidance. The catheter was then placed through the sheath and the sheath was removed. Final catheter positioning was confirmed and documented with a fluoroscopic spot radiograph. The port was accessed with a Huber needle, aspirated and flushed with heparinized saline. The port pocket incision was closed with interrupted 3-0 Vicryl suture then Dermabond was applied, including at the venotomy incision. Dressings were placed. The patient tolerated the procedure well without immediate post procedural complication. IMPRESSION: Successful placement of a RIGHT internal jugular approach power injectable Port-A-Cath. The tip of the catheter is positioned within the proximal RIGHT atrium. The catheter is ready for immediate use. Roanna Banning, MD [facility] Radiology Electronically Signed   By: Roanna Banning M.D.   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 * Tracking Code: BO * EXAM: CT ABDOMEN AND PELVIS WITH CONTRAST TECHNIQUE: Multidetector CT imaging of the abdomen and pelvis was performed using the standard protocol following bolus administration of intravenous contrast. RADIATION DOSE REDUCTION: This exam was performed according to the departmental  dose-optimization program which includes automated exposure control, adjustment of the mA and/or kV according to patient size and/or use of iterative reconstruction technique. CONTRAST:  OMNIPAQUE IOHEXOL 300 MG/ML  SOLN COMPARISON:  PET-CT on 05/02/2023 FINDINGS: Lower Chest: No acute findings. Hepatobiliary:  No suspicious hepatic masses identified. Pancreas:  No mass or inflammatory changes. Spleen: Within normal limits in size and appearance. Adrenals/Urinary Tract: No suspicious masses identified. No evidence of ureteral calculi or hydronephrosis. Stomach/Bowel: Large poorly defined mass with central necrosis is again seen in the left lower quadrant mesentery which shows involvement of small bowel loops. This measures 11.8 x 7.6 cm on image 73/2, compared to 10.7 x 7.4 cm at same level on previous study. Small adjacent mesenteric soft tissue nodules or lymph nodes are again seen, some showing mild increase in size since previous study. Index lesion measures 2.0 x 1.7 cm on image 71/2 compared to 1.7 x 1.2 cm on prior exam. Surgical drain remains in place. No No evidence of bowel obstruction. Vascular/Lymphatic: Mild increase in small mesenteric soft tissue nodules or lymph nodes, as  described above. No acute vascular findings. Reproductive:  Stable mildly enlarged prostate. Other:  None. Musculoskeletal:  No suspicious bone lesions identified. IMPRESSION: Mild increase in size of large necrotic mass in left lower quadrant mesentery, which involves small bowel. Mild increase in size of adjacent small mesenteric soft tissue nodules or lymph nodes. Electronically Signed   By: Danae Orleans M.D.   On: 05/08/2023 14:39    Subjective: Patient was seen and examined at bedside.  Overnight events noted.  Patient report doing much better. Patient wants to be discharged.  Home health services arranged.  Discharge Exam: Vitals:   06/06/23 2116 06/07/23 0521  BP: (!) 142/88 (!) 163/96  Pulse: 88 88  Resp:  20 20  Temp: 98.2 F (36.8 C) 98.6 F (37 C)  SpO2: 95% 95%   Vitals:   06/06/23 1347 06/06/23 2016 06/06/23 2116 06/07/23 0521  BP: (!) 148/91 (!) 173/91 (!) 142/88 (!) 163/96  Pulse: (!) 104 89 88 88  Resp: 18 20 20 20   Temp: 99.4 F (37.4 C) 98.4 F (36.9 C) 98.2 F (36.8 C) 98.6 F (37 C)  TempSrc: Oral Oral Oral Oral  SpO2: 96% 96% 95% 95%  Weight:      Height:        General: Pt is alert, awake, not in acute distress Cardiovascular: RRR, S1/S2 +, no rubs, no gallops Respiratory: CTA bilaterally, no wheezing, no rhonchi Abdominal: Soft, NT, ND, bowel sounds +, PTC drain noted. Extremities: no edema, no cyanosis    The results of significant diagnostics from this hospitalization (including imaging, microbiology, ancillary and laboratory) are listed below for reference.     Microbiology: Recent Results (from the past 240 hours)  Culture, Urine     Status: Abnormal   Collection Time: 06/03/23 11:08 AM   Specimen: Urine, Clean Catch  Result Value Ref Range Status   Specimen Description   Final    URINE, CLEAN CATCH Performed at [facility], [address] [place], [place] [address]    Special Requests   Final    NONE Performed at [facility], [address], [place], [place] [address]    Culture (A)  Final    <10,000 COLONIES/mL INSIGNIFICANT GROWTH Performed at [facility], [address] [place], [place] [address]    Report Status 06/04/2023 FINAL  Final  Resp panel by RT-PCR (RSV, Flu A&B, Covid) Anterior Nasal Swab     Status: None   Collection Time: 06/03/23 11:51 AM   Specimen: Anterior Nasal Swab  Result Value Ref Range Status   SARS Coronavirus 2 by RT PCR NEGATIVE NEGATIVE Final   Influenza A by PCR NEGATIVE NEGATIVE Final   Influenza B by PCR NEGATIVE NEGATIVE Final    Comment: (NOTE) The Xpert Xpress SARS-CoV-2/FLU/RSV plus assay is intended as an aid in the diagnosis of influenza from  Nasopharyngeal swab specimens and should not be used as a sole basis for treatment. Nasal washings and aspirates are unacceptable for Xpert Xpress SARS-CoV-2/FLU/RSV testing.  Fact Sheet for Patients: BloggerCourse.com  Fact Sheet for Healthcare Providers: SeriousBroker.it  This test is not yet approved or cleared by the [place] FDA and has been authorized for detection and/or diagnosis of SARS-CoV-2 by FDA under an Emergency Use Authorization (EUA). This EUA will remain in effect (meaning this test can be used) for the duration of the COVID-19 declaration under Section 564(b)(1) of the Act, 21 U.S.C. section 360bbb-3(b)(1), unless the authorization is  terminated or revoked.     Resp Syncytial Virus by PCR NEGATIVE NEGATIVE Final    Comment: (NOTE) Fact Sheet for Patients: BloggerCourse.com  Fact Sheet for Healthcare Providers: SeriousBroker.it  This test is not yet approved or cleared by the [place] FDA and has been authorized for detection and/or diagnosis of SARS-CoV-2 by FDA under an Emergency Use Authorization (EUA). This EUA will remain in effect (meaning this test can be used) for the duration of the COVID-19 declaration under Section 564(b)(1) of the Act, 21 U.S.C. section 360bbb-3(b)(1), unless the authorization is terminated or revoked.  Performed at [facility], [address], [place], [place] [address]   Culture, blood (routine x 2)     Status: None (Preliminary result)   Collection Time: 06/03/23 12:11 PM   Specimen: BLOOD  Result Value Ref Range Status   Specimen Description   Final    BLOOD RIGHT ANTECUBITAL Performed at [facility], [address], [place], [place] [address]    Special Requests   Final    NONE Performed at [facility], [address], [place], [place] [address]    Culture   Final     NO GROWTH 4 DAYS Performed at [facility], [address], [place], [place] [address]    Report Status PENDING  Incomplete  Culture, blood (routine x 2)     Status: None (Preliminary result)   Collection Time: 06/03/23 12:26 PM   Specimen: BLOOD  Result Value Ref Range Status   Specimen Description   Final    BLOOD RIGHT ANTECUBITAL Performed at [facility], [address], [place], [place] [address]    Special Requests   Final    RIGHT ANTECUBITAL Performed at [facility], [address] [place], [place] [address]    Culture   Final    NO GROWTH 4 DAYS Performed at [facility] [facility], [address], [place], [place] [address]    Report Status PENDING  Incomplete  Aerobic/Anaerobic Culture w Gram Stain (surgical/deep wound)     Status: None (Preliminary result)   Collection Time: 06/05/23  5:12 PM   Specimen: Abscess  Result Value Ref Range Status   Specimen Description   Final    ABSCESS Performed at [facility], [address], [place], [place] [address]    Special Requests   Final    NONE Performed at [facility], [address] [place], [place] [address]    Gram Stain   Final    MODERATE WBC PRESENT,BOTH PMN AND MONONUCLEAR MODERATE GRAM POSITIVE COCCI MODERATE GRAM NEGATIVE RODS FEW GRAM POSITIVE RODS    Culture   Final    MODERATE KLEBSIELLA PNEUMONIAE SUSCEPTIBILITIES TO FOLLOW HOLDING FOR POSSIBLE ANAEROBE Performed at [facility], [address], [place], [place] [address]    Report Status PENDING  Incomplete  Respiratory (~20 pathogens) panel by PCR     Status: None   Collection Time: 06/06/23 12:15 PM   Specimen: Nasopharyngeal Swab; Respiratory  Result Value Ref Range Status   Adenovirus NOT DETECTED NOT DETECTED Final   Coronavirus 229E NOT DETECTED NOT DETECTED Final    Comment: (NOTE) The Coronavirus on the Respiratory Panel, DOES NOT test for the novel   Coronavirus (2019 nCoV)    Coronavirus HKU1 NOT DETECTED NOT DETECTED Final   Coronavirus NL63 NOT DETECTED NOT DETECTED Final   Coronavirus OC43  NOT DETECTED NOT DETECTED Final   Metapneumovirus NOT DETECTED NOT DETECTED Final   Rhinovirus / Enterovirus NOT DETECTED NOT DETECTED Final   Influenza A NOT DETECTED NOT DETECTED Final   Influenza B NOT DETECTED NOT DETECTED Final   Parainfluenza Virus 1 NOT DETECTED NOT DETECTED Final   Parainfluenza Virus 2 NOT DETECTED NOT DETECTED Final   Parainfluenza Virus 3 NOT DETECTED NOT DETECTED Final   Parainfluenza Virus 4 NOT DETECTED NOT DETECTED Final   Respiratory Syncytial Virus NOT DETECTED NOT DETECTED Final   Bordetella pertussis NOT DETECTED NOT DETECTED Final   Bordetella Parapertussis NOT DETECTED NOT DETECTED Final   Chlamydophila pneumoniae NOT DETECTED NOT DETECTED Final   Mycoplasma pneumoniae NOT DETECTED NOT DETECTED Final    Comment: Performed at [facility], [address] [place], [place] [address]     Labs: BNP (last 3 results) No results for input(s): "BNP" in the last 8760 hours. Basic Metabolic Panel: Recent Labs  Lab 06/03/23 1050 06/04/23 0443 06/05/23 0510 06/06/23 0458 06/07/23 0534  NA 140 136 140 141 136  K 3.7 3.8 3.2* 3.4* 3.3*  CL 104 105 106 106 103  CO2 25 21* 23 25 25   GLUCOSE 169* 119* 139* 128* 142*  BUN 23 18 14 14 12   CREATININE 1.27* 0.96 0.87 0.94 0.85  CALCIUM 9.3 8.4* 8.6* 8.1* 8.5*  MG  --   --  2.1 2.0 1.9  PHOS  --   --  3.2  --  4.3   Liver Function Tests: Recent Labs  Lab 06/03/23 1050  AST 13*  ALT 22  ALKPHOS 66  BILITOT 0.5  PROT 6.8  ALBUMIN 3.7   No results for input(s): "LIPASE", "AMYLASE" in the last 168 hours. No results for input(s): "AMMONIA" in the last 168 hours. CBC: Recent Labs  Lab 06/03/23 1050 06/04/23 0443 06/05/23 0510 06/06/23 0458 06/07/23 0534  WBC 4.6 12.5* 18.6* 19.0* 22.2*  NEUTROABS 1.5* 8.4* 12.4* 12.6* 15.1*  HGB 11.8* 10.2*  9.6* 10.0* 10.0*  HCT 36.6* 32.9* 31.3* 32.3* 31.8*  MCV 86.3 91.1 90.5 91.0 89.3  PLT 250 201 189 190 202   Cardiac Enzymes: No results for input(s): "CKTOTAL", "CKMB", "CKMBINDEX", "TROPONINI" in the last 168 hours. BNP: Invalid input(s): "POCBNP" CBG: Recent Labs  Lab 06/06/23 0733 06/06/23 1130 06/06/23 1621 06/06/23 2119 06/07/23 0757  GLUCAP 156* 164* 165* 210* 145*   D-Dimer No results for input(s): "DDIMER" in the last 72 hours. Hgb A1c No results for input(s): "HGBA1C" in the last 72 hours. Lipid Profile No results for input(s): "CHOL", "HDL", "LDLCALC", "TRIG", "CHOLHDL", "LDLDIRECT" in the last 72 hours. Thyroid function studies No results for input(s): "TSH", "T4TOTAL", "T3FREE", "THYROIDAB" in the last 72 hours.  Invalid input(s): "FREET3" Anemia work up No results for input(s): "VITAMINB12", "FOLATE", "FERRITIN", "TIBC", "IRON", "RETICCTPCT" in the last 72 hours. Urinalysis    Component Value Date/Time   COLORURINE YELLOW 06/03/2023 1108   APPEARANCEUR CLEAR 06/03/2023 1108   LABSPEC 1.025 06/03/2023 1108   PHURINE 5.0 06/03/2023 1108   GLUCOSEU NEGATIVE 06/03/2023 1108   HGBUR NEGATIVE 06/03/2023 1108   BILIRUBINUR NEGATIVE 06/03/2023 1108   KETONESUR NEGATIVE 06/03/2023 1108   PROTEINUR NEGATIVE 06/03/2023 1108   NITRITE NEGATIVE 06/03/2023 1108   LEUKOCYTESUR NEGATIVE 06/03/2023 1108   Sepsis Labs Recent Labs  Lab 06/04/23 0443 06/05/23 0510 06/06/23 0458 06/07/23 0534  WBC 12.5* 18.6* 19.0* 22.2*   Microbiology Recent Results (from the past 240 hours)  Culture,  Urine     Status: Abnormal   Collection Time: 06/03/23 11:08 AM   Specimen: Urine, Clean Catch  Result Value Ref Range Status   Specimen Description   Final    URINE, CLEAN CATCH Performed at [facility], [address] [place], [place] [address]    Special Requests   Final    NONE Performed at [facility], [address],  [place], [place] [address]    Culture (A)  Final    <10,000 COLONIES/mL INSIGNIFICANT GROWTH Performed at [facility], [address], [place], [place] [address]    Report Status 06/04/2023 FINAL  Final  Resp panel by RT-PCR (RSV, Flu A&B, Covid) Anterior Nasal Swab     Status: None   Collection Time: 06/03/23 11:51 AM   Specimen: Anterior Nasal Swab  Result Value Ref Range Status   SARS Coronavirus 2 by RT PCR NEGATIVE NEGATIVE Final   Influenza A by PCR NEGATIVE NEGATIVE Final   Influenza B by PCR NEGATIVE NEGATIVE Final    Comment: (NOTE) The Xpert Xpress SARS-CoV-2/FLU/RSV plus assay is intended as an aid in the diagnosis of influenza from Nasopharyngeal swab specimens and should not be used as a sole basis for treatment. Nasal washings and aspirates are unacceptable for Xpert Xpress SARS-CoV-2/FLU/RSV testing.  Fact Sheet for Patients: BloggerCourse.com  Fact Sheet for Healthcare Providers: SeriousBroker.it  This test is not yet approved or cleared by the [place] FDA and has been authorized for detection and/or diagnosis of SARS-CoV-2 by FDA under an Emergency Use Authorization (EUA). This EUA will remain in effect (meaning this test can be used) for the duration of the COVID-19 declaration under Section 564(b)(1) of the Act, 21 U.S.C. section 360bbb-3(b)(1), unless the authorization is terminated or revoked.     Resp Syncytial Virus by PCR NEGATIVE NEGATIVE Final    Comment: (NOTE) Fact Sheet for Patients: BloggerCourse.com  Fact Sheet for Healthcare Providers: SeriousBroker.it  This test is not yet approved or cleared by the [place] FDA and has been authorized for detection and/or diagnosis of SARS-CoV-2 by FDA under an Emergency Use Authorization (EUA). This EUA will remain in effect (meaning this test can be used) for the duration of the COVID-19  declaration under Section 564(b)(1) of the Act, 21 U.S.C. section 360bbb-3(b)(1), unless the authorization is terminated or revoked.  Performed at [facility], [address], [place], [place] [address]   Culture, blood (routine x 2)     Status: None (Preliminary result)   Collection Time: 06/03/23 12:11 PM   Specimen: BLOOD  Result Value Ref Range Status   Specimen Description   Final    BLOOD RIGHT ANTECUBITAL Performed at [facility], [address], [place], [place] [address]    Special Requests   Final    NONE Performed at [facility], [address], [place], [place] [address]    Culture   Final    NO GROWTH 4 DAYS Performed at [facility], [address] [place], [place] [address]    Report Status PENDING  Incomplete  Culture, blood (routine x 2)     Status: None (Preliminary result)   Collection Time: 06/03/23 12:26 PM   Specimen: BLOOD  Result Value Ref Range Status   Specimen Description   Final    BLOOD RIGHT ANTECUBITAL Performed at [facility], [address], [place], [place] [address]  Special Requests   Final    RIGHT ANTECUBITAL Performed at [facility], [address], [place], [place] [address]    Culture   Final    NO GROWTH 4 DAYS Performed at [facility], [address] [place], [place] [address]    Report Status PENDING  Incomplete  Aerobic/Anaerobic Culture w Gram Stain (surgical/deep wound)     Status: None (Preliminary result)   Collection Time: 06/05/23  5:12 PM   Specimen: Abscess  Result Value Ref Range Status   Specimen Description   Final    ABSCESS Performed at [facility], [address] [place], [place] [address]    Special Requests   Final    NONE Performed at [facility], [address] [place], [place] [address]    Gram Stain   Final    MODERATE WBC PRESENT,BOTH PMN AND MONONUCLEAR MODERATE GRAM  POSITIVE COCCI MODERATE GRAM NEGATIVE RODS FEW GRAM POSITIVE RODS    Culture   Final    MODERATE KLEBSIELLA PNEUMONIAE SUSCEPTIBILITIES TO FOLLOW HOLDING FOR POSSIBLE ANAEROBE Performed at [facility], [address] [place], [place] [address]    Report Status PENDING  Incomplete  Respiratory (~20 pathogens) panel by PCR     Status: None   Collection Time: 06/06/23 12:15 PM   Specimen: Nasopharyngeal Swab; Respiratory  Result Value Ref Range Status   Adenovirus NOT DETECTED NOT DETECTED Final   Coronavirus 229E NOT DETECTED NOT DETECTED Final    Comment: (NOTE) The Coronavirus on the Respiratory Panel, DOES NOT test for the novel  Coronavirus (2019 nCoV)    Coronavirus HKU1 NOT DETECTED NOT DETECTED Final   Coronavirus NL63 NOT DETECTED NOT DETECTED Final   Coronavirus OC43 NOT DETECTED NOT DETECTED Final   Metapneumovirus NOT DETECTED NOT DETECTED Final   Rhinovirus / Enterovirus NOT DETECTED NOT DETECTED Final   Influenza A NOT DETECTED NOT DETECTED Final   Influenza B NOT DETECTED NOT DETECTED Final   Parainfluenza Virus 1 NOT DETECTED NOT DETECTED Final   Parainfluenza Virus 2 NOT DETECTED NOT DETECTED Final   Parainfluenza Virus 3 NOT DETECTED NOT DETECTED Final   Parainfluenza Virus 4 NOT DETECTED NOT DETECTED Final   Respiratory Syncytial Virus NOT DETECTED NOT DETECTED Final   Bordetella pertussis NOT DETECTED NOT DETECTED Final   Bordetella Parapertussis NOT DETECTED NOT DETECTED Final   Chlamydophila pneumoniae NOT DETECTED NOT DETECTED Final   Mycoplasma pneumoniae NOT DETECTED NOT DETECTED Final    Comment: Performed at [facility], [address] [place], [place] [address]     Time coordinating discharge: Over 30 minutes  SIGNED:   Willeen Niece, MD  [facility] 06/07/2023, 1:34 PM Pager   If 7PM-7AM, please contact night-coverage

## 2023-06-07 NOTE — Discharge Instructions (Addendum)
 Advised to follow-up with primary care physician in 1 week. Advised to follow-up with general surgery as scheduled. Advised to follow-up with IR for percutaneous drain. Advised to take Augmentin twice a day for 10 days.  DRESSING CHANGE INSTRUCTIONS Daily: cleanse with saline, pat dry, apply hydrogel [place] # [phone]) to the wound bed, top with dry dressing   FLUSHING DRAIN EVERY 8 HOURS: empty drain, disconnect drain at connection site; wipe connection site at least 5 seconds with alcohol pad, flush with 5ml of normal saline 0.9% flush, wipe connection site with alcohol pad at least 5 seconds before reconnection

## 2023-06-08 LAB — CULTURE, BLOOD (ROUTINE X 2)
Culture: NO GROWTH
Culture: NO GROWTH

## 2023-06-10 LAB — AEROBIC/ANAEROBIC CULTURE W GRAM STAIN (SURGICAL/DEEP WOUND)

## 2023-06-13 ENCOUNTER — Other Ambulatory Visit: Payer: Self-pay | Admitting: Hematology and Oncology

## 2023-06-13 ENCOUNTER — Other Ambulatory Visit (HOSPITAL_COMMUNITY): Payer: Self-pay

## 2023-06-13 MED ORDER — AMOXICILLIN-POT CLAVULANATE 875-125 MG PO TABS
1.0000 | ORAL_TABLET | Freq: Two times a day (BID) | ORAL | 3 refills | Status: DC
  Filled 2023-06-13 – 2023-06-16 (×2): qty 60, 30d supply, fill #0
  Filled 2023-07-11: qty 60, 30d supply, fill #1
  Filled 2023-08-17: qty 60, 30d supply, fill #2
  Filled 2023-09-13: qty 60, 30d supply, fill #3

## 2023-06-13 NOTE — Progress Notes (Signed)
 REFERRING PHYSICIAN:  Seabron Alm Fallow, *  SURGEON:  CORDELLA BEVERLEY IDLER, MD  MRN: [id number] DOB: [birth year] DATE OF ENCOUNTER: 06/13/2023  Subjective   Chief Complaint: Post Operative Visit ( laparotomy)     History of Present Illness:  Elijah [name] is a 68 y.o. 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  He presented on 1/9 with abdominal pain and CT imaging showed a mesenteric mass with small bowel perforation. I took him urgently to the OR, where I was able to free up the mass from a large abscess cavity adjacent to the sigmoid. The small bowel was damaged in multiple areas and there was spillage of organic matter into the abdomen.  He underwent a SBR and drain placement.  The pathology showed an aggressive lymphoma and he is currently undergoing chemotherapy  Interval History: He was admitted to the hospital last week. A CT showed persistence of the abdominal mass with possible superimposed infection.  He ultimately underwent drain placement and discharged 3/8 on Augmentin .  He reports that since discharge he has been doing well.  His energy level is improving and he is doing daily walks with his wife.  He is tolerating PO and having bowel function.  He denies fevers, chills, or malaise.     Review of Systems: A complete review of systems was obtained from the patient.  I have reviewed this information and discussed as appropriate with the patient.  See HPI as well for other ROS.  Review of Systems  Constitutional: Negative.   HENT: Negative.    Eyes: Negative.   Respiratory: Negative.    Cardiovascular: Negative.   Gastrointestinal:  Positive for constipation.       Anorexia  Genitourinary: Negative.   Musculoskeletal: Negative.   Skin: Negative.   Neurological: Negative.   Endo/Heme/Allergies: Negative.   Psychiatric/Behavioral: Negative.        Vitals:   06/13/23 1345  06/13/23 1346  BP: (!) 144/94   Pulse: 107   Temp: 36.6 C (97.8 F)   SpO2: 99%   Weight: (!) 101 kg (222 lb 9.6 oz)   Height: 185.4 cm (6' 1)   PainSc:  0-No pain  PainLoc:  Abdomen    Body mass index is 29.37 kg/m.  Allergies  Allergen Reactions  . Montelukast Hives  . Amiodarone Unknown    Very aggressive behavior  . Rosuvastatin Other (See Comments)    Muscle Aches    Current Outpatient Medications on File Prior to Visit  Medication Sig Dispense Refill  . albuterol  MDI, PROVENTIL , VENTOLIN , PROAIR , HFA 90 mcg/actuation inhaler Inhale 2 inhalations into the lungs    . alfuzosin  (UROXATRAL ) 10 mg ER tablet Take 1 tablet by mouth once daily    . minoxidiL  2.5 MG tablet Take 2.5 mg by mouth once daily    . ondansetron  (ZOFRAN ) 4 MG tablet Take 1 tablet (4 mg total) by mouth every 8 (eight) hours as needed for Nausea for up to 50 days 40 tablet 3  . oxyCODONE  (ROXICODONE ) 5 MG immediate release tablet Take 5-10 mg by mouth    . predniSONE  (DELTASONE ) 5 MG tablet Take by mouth    . prochlorperazine  (COMPAZINE ) 5 MG tablet Take 5 mg by mouth    . rosuvastatin (CRESTOR) 5 MG tablet Take 5 mg by mouth once daily     No current facility-administered medications on file prior to  visit.    Patient Active Problem List  Diagnosis  . 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 Severe major depression (CMS/HHS-HCC)  . Small bowel perforation (CMS/HHS-HCC)  . Obstructive sleep apnea  . Gastroesophageal reflux disease without esophagitis  . Exercise-induced asthma (HHS-HCC)  . Daytime somnolence  . Allergic rhinitis due to allergen    Past Medical History:  Diagnosis Date  . Asthma, unspecified asthma severity, unspecified whether complicated, unspecified whether persistent (HHS-HCC)   . Sleep apnea     Past Surgical History:  Procedure Laterality Date  . Cystoscopy with retrograde pyelogram, ureteroscopy and stent placement  Left 06/29/2020   . EXPLORATORY LAPAROTOMY  04/10/2023   Dr Emergency planning/management officer  . RESECTION SMALL BOWEL  04/10/2023   Dr Polly  . Ankle surgery Right   . Chest tube insertion  Left   . COLONOSCOPY    . Knee surgery Right   . Skin cancer excision    . TONSILLECTOMY    . VASECTOMY       History reviewed. No pertinent family history.   Social History   Tobacco Use  Smoking Status Never  Smokeless Tobacco Never     Social History   Socioeconomic History  . Marital status: Married  Tobacco Use  . Smoking status: Never  . Smokeless tobacco: Never  Substance and Sexual Activity  . Alcohol use: Yes  . Drug use: Never   Social Drivers of Health   Food Insecurity: No Food Insecurity (06/03/2023)   Received from [facility]   Hunger Vital Sign   . Worried About Programme researcher, broadcasting/film/video in the Last Year: Never true   . Ran Out of Food in the Last Year: Never true  Transportation Needs: No Transportation Needs (06/03/2023)   Received from [facility] - Transportation   . Lack of Transportation (Medical): No   . Lack of Transportation (Non-Medical): No  Social Connections: Socially Integrated (06/04/2023)   Received from [facility]   Social Connection and Isolation Panel [NHANES]   . Frequency of Communication with Friends and Family: More than three times a week   . Frequency of Social Gatherings with Friends and Family: Three times a week   . Attends Religious Services: More than 4 times per year   . Active Member of Clubs or Organizations: No   . Attends Banker Meetings: 1 to 4 times per year   . Marital Status: Married  Housing Stability: Low Risk  (06/03/2023)   Received from [facility] Stability Vital Sign   . Unable to Pay for Housing in the Last Year: No   . Number of Times Moved in the Last Year: 0   . Homeless in the Last Year: No    Objective:   Physical Exam   Gen: male, NAD Abd: soft, non-distended, drainage bag with low volume purulent/thin white  output  I reviewed a picture of his midline wound it appears healthy. There is a pink base and it is much shallower since the last time I saw it.   Labs, Imaging and Diagnostic Testing:   Procedure Note:    Assessment and Plan:  68 y/o M w/ a recently discovered small bowel lymphoma that was identified after he presented with perforation. - His operative drain had been removed prior to him starting chemo, but unfortunately he had to be readmitted with concern for an intra-abdominal infection.  Since getting a drain and starting  antibiotics he has been doing well.  We had a long discussion today regarding the risks of moving forward with his next planned chemo session on 3/18.  He is at high risk for developing a worsening intra-abdominal infection, but I do believe that the risk has been mitigated as much as possible with the placement of the drain and initiation of antibiotics. His primary goal is to continue chemotherapy, as he knows that this is the only opportunity to extend his life.  With that in mind and with him having already had an issue after the last drain was removed, I would favor keeping the drain throughout treatment and continuing him on antibiotics.  I will reach out to his oncologist Dr. Federico to see if he has additional thoughts.  - I will tentatively plan to see him back in 6-8 weeks for a wound check but will remain available to address any questions/concerns in the interim.  GREGORY BEVERLEY IDLER, MD

## 2023-06-14 ENCOUNTER — Other Ambulatory Visit (HOSPITAL_COMMUNITY): Payer: Self-pay

## 2023-06-16 ENCOUNTER — Other Ambulatory Visit: Payer: Self-pay | Admitting: Student

## 2023-06-16 ENCOUNTER — Other Ambulatory Visit (HOSPITAL_BASED_OUTPATIENT_CLINIC_OR_DEPARTMENT_OTHER): Payer: Self-pay

## 2023-06-16 ENCOUNTER — Other Ambulatory Visit: Payer: Self-pay | Admitting: Rheumatology

## 2023-06-16 ENCOUNTER — Encounter: Payer: Self-pay | Admitting: Hematology and Oncology

## 2023-06-16 ENCOUNTER — Other Ambulatory Visit: Payer: Self-pay | Admitting: *Deleted

## 2023-06-16 ENCOUNTER — Other Ambulatory Visit (HOSPITAL_COMMUNITY): Payer: Self-pay

## 2023-06-16 ENCOUNTER — Other Ambulatory Visit: Payer: Self-pay

## 2023-06-16 DIAGNOSIS — K651 Peritoneal abscess: Secondary | ICD-10-CM

## 2023-06-16 DIAGNOSIS — M353 Polymyalgia rheumatica: Secondary | ICD-10-CM

## 2023-06-16 MED ORDER — PREDNISONE 20 MG PO TABS
60.0000 mg | ORAL_TABLET | Freq: Every day | ORAL | 5 refills | Status: DC
Start: 2023-06-16 — End: 2023-10-03
  Filled 2023-06-16 (×2): qty 15, 5d supply, fill #0
  Filled 2023-06-26: qty 15, 5d supply, fill #1
  Filled 2023-07-11: qty 15, 5d supply, fill #2
  Filled 2023-08-05: qty 15, 5d supply, fill #3
  Filled 2023-08-17: qty 15, 5d supply, fill #4
  Filled 2023-09-13: qty 15, 5d supply, fill #5

## 2023-06-16 MED ORDER — PREDNISONE 1 MG PO TABS
ORAL_TABLET | ORAL | 0 refills | Status: DC
  Filled 2023-06-16 – 2023-07-20 (×2): qty 90, 30d supply, fill #0

## 2023-06-16 MED ORDER — METHOCARBAMOL 500 MG PO TABS
1000.0000 mg | ORAL_TABLET | Freq: Three times a day (TID) | ORAL | 1 refills | Status: DC | PRN
  Filled 2023-06-16 (×2): qty 60, 10d supply, fill #0

## 2023-06-16 MED FILL — Fosaprepitant Dimeglumine For IV Infusion 150 MG (Base Eq): INTRAVENOUS | Qty: 5 | Status: AC

## 2023-06-16 NOTE — Telephone Encounter (Signed)
 Last Fill: 05/27/2023  Next Visit: Due 09/24/2023. Message sent to the front to schedule.   Last Visit: 05/27/2023  Dx: Polymyalgia rheumatica (HCC)   Current Dose per office note on 05/27/2023: The plan is to taper prednisone by 5 mg every 2 weeks until he reaches prednisone 10 mg daily. After that he will reduce prednisone by 1 mg every month.   Based on office note, changed prescription to 3 tablets daily and 90 for the month. Patient was taking 9 mg last month, prescription changed to take 8 mg for this month. Please review prescription and change as needed.   Okay to refill Prednisone?

## 2023-06-16 NOTE — Telephone Encounter (Signed)
 Please schedule patient a follow up visit. Patient due 09/24/2023. Thanks!

## 2023-06-16 NOTE — Telephone Encounter (Signed)
 LMOM for patient to schedule 4 month follow-up appointment.

## 2023-06-17 ENCOUNTER — Other Ambulatory Visit: Payer: Self-pay

## 2023-06-17 ENCOUNTER — Inpatient Hospital Stay: Payer: Medicare Other

## 2023-06-17 ENCOUNTER — Other Ambulatory Visit (HOSPITAL_COMMUNITY): Payer: Self-pay

## 2023-06-17 ENCOUNTER — Inpatient Hospital Stay (HOSPITAL_BASED_OUTPATIENT_CLINIC_OR_DEPARTMENT_OTHER): Payer: Medicare Other | Admitting: Hematology and Oncology

## 2023-06-17 ENCOUNTER — Inpatient Hospital Stay: Payer: Medicare Other | Admitting: Nutrition

## 2023-06-17 VITALS — BP 145/82 | HR 88 | Temp 98.3°F | Resp 17 | Wt 226.5 lb

## 2023-06-17 DIAGNOSIS — Z5111 Encounter for antineoplastic chemotherapy: Secondary | ICD-10-CM | POA: Diagnosis present

## 2023-06-17 DIAGNOSIS — C8449 Peripheral T-cell lymphoma, not classified, extranodal and solid organ sites: Secondary | ICD-10-CM

## 2023-06-17 DIAGNOSIS — R5081 Fever presenting with conditions classified elsewhere: Secondary | ICD-10-CM | POA: Diagnosis not present

## 2023-06-17 DIAGNOSIS — Z95828 Presence of other vascular implants and grafts: Secondary | ICD-10-CM

## 2023-06-17 DIAGNOSIS — D709 Neutropenia, unspecified: Secondary | ICD-10-CM | POA: Diagnosis not present

## 2023-06-17 DIAGNOSIS — Z79899 Other long term (current) drug therapy: Secondary | ICD-10-CM | POA: Diagnosis not present

## 2023-06-17 LAB — CBC WITH DIFFERENTIAL (CANCER CENTER ONLY)
Abs Immature Granulocytes: 0.42 10*3/uL — ABNORMAL HIGH (ref 0.00–0.07)
Basophils Absolute: 0.2 10*3/uL — ABNORMAL HIGH (ref 0.0–0.1)
Basophils Relative: 2 %
Eosinophils Absolute: 0.1 10*3/uL (ref 0.0–0.5)
Eosinophils Relative: 1 %
HCT: 36.1 % — ABNORMAL LOW (ref 39.0–52.0)
Hemoglobin: 11.6 g/dL — ABNORMAL LOW (ref 13.0–17.0)
Immature Granulocytes: 3 %
Lymphocytes Relative: 10 %
Lymphs Abs: 1.3 10*3/uL (ref 0.7–4.0)
MCH: 28.6 pg (ref 26.0–34.0)
MCHC: 32.1 g/dL (ref 30.0–36.0)
MCV: 89.1 fL (ref 80.0–100.0)
Monocytes Absolute: 1.1 10*3/uL — ABNORMAL HIGH (ref 0.1–1.0)
Monocytes Relative: 8 %
Neutro Abs: 10.2 10*3/uL — ABNORMAL HIGH (ref 1.7–7.7)
Neutrophils Relative %: 76 %
Platelet Count: 381 10*3/uL (ref 150–400)
RBC: 4.05 MIL/uL — ABNORMAL LOW (ref 4.22–5.81)
RDW: 17.5 % — ABNORMAL HIGH (ref 11.5–15.5)
WBC Count: 13.4 10*3/uL — ABNORMAL HIGH (ref 4.0–10.5)
nRBC: 0.1 % (ref 0.0–0.2)

## 2023-06-17 LAB — CMP (CANCER CENTER ONLY)
ALT: 34 U/L (ref 0–44)
AST: 13 U/L — ABNORMAL LOW (ref 15–41)
Albumin: 3.8 g/dL (ref 3.5–5.0)
Alkaline Phosphatase: 65 U/L (ref 38–126)
Anion gap: 8 (ref 5–15)
BUN: 27 mg/dL — ABNORMAL HIGH (ref 8–23)
CO2: 25 mmol/L (ref 22–32)
Calcium: 9.5 mg/dL (ref 8.9–10.3)
Chloride: 107 mmol/L (ref 98–111)
Creatinine: 1.01 mg/dL (ref 0.61–1.24)
GFR, Estimated: >60 mL/min (ref >60)
Glucose, Bld: 182 mg/dL — ABNORMAL HIGH (ref 70–99)
Potassium: 3.6 mmol/L (ref 3.5–5.1)
Sodium: 140 mmol/L (ref 135–145)
Total Bilirubin: 0.3 mg/dL (ref 0.0–1.2)
Total Protein: 6.8 g/dL (ref 6.5–8.1)

## 2023-06-17 LAB — LACTATE DEHYDROGENASE: LDH: 184 U/L (ref 98–192)

## 2023-06-17 LAB — URIC ACID: Uric Acid, Serum: 4.4 mg/dL (ref 3.7–8.6)

## 2023-06-17 MED ORDER — SODIUM CHLORIDE 0.9% FLUSH
10.0000 mL | INTRAVENOUS | Status: DC | PRN
  Administered 2023-06-17: 10 mL

## 2023-06-17 MED ORDER — ONDANSETRON HCL 8 MG PO TABS
8.0000 mg | ORAL_TABLET | Freq: Three times a day (TID) | ORAL | 0 refills | Status: DC | PRN
  Filled 2023-06-17: qty 18, 21d supply, fill #0
  Filled 2023-07-20: qty 12, 4d supply, fill #1

## 2023-06-17 MED ORDER — DEXAMETHASONE SODIUM PHOSPHATE 10 MG/ML IJ SOLN
10.0000 mg | Freq: Once | INTRAMUSCULAR | Status: AC
  Administered 2023-06-17: 10 mg via INTRAVENOUS
  Filled 2023-06-17: qty 1

## 2023-06-17 MED ORDER — PALONOSETRON HCL INJECTION 0.25 MG/5ML
0.2500 mg | Freq: Once | INTRAVENOUS | Status: AC
  Administered 2023-06-17: 0.25 mg via INTRAVENOUS
  Filled 2023-06-17: qty 5

## 2023-06-17 MED ORDER — SODIUM CHLORIDE 0.9 % IV SOLN
750.0000 mg/m2 | Freq: Once | INTRAVENOUS | Status: AC
  Administered 2023-06-17: 1740 mg via INTRAVENOUS
  Filled 2023-06-17: qty 87

## 2023-06-17 MED ORDER — DOXORUBICIN HCL CHEMO IV INJECTION 2 MG/ML
50.0000 mg/m2 | Freq: Once | INTRAVENOUS | Status: AC
  Administered 2023-06-17: 116 mg via INTRAVENOUS
  Filled 2023-06-17: qty 58

## 2023-06-17 MED ORDER — SODIUM CHLORIDE 0.9 % IV SOLN
150.0000 mg | Freq: Once | INTRAVENOUS | Status: AC
  Administered 2023-06-17: 150 mg via INTRAVENOUS
  Filled 2023-06-17: qty 150

## 2023-06-17 MED ORDER — VINCRISTINE SULFATE CHEMO INJECTION 1 MG/ML
2.0000 mg | Freq: Once | INTRAVENOUS | Status: AC
  Administered 2023-06-17: 2 mg via INTRAVENOUS
  Filled 2023-06-17: qty 2

## 2023-06-17 MED ORDER — HEPARIN SOD (PORK) LOCK FLUSH 100 UNIT/ML IV SOLN
500.0000 [IU] | Freq: Once | INTRAVENOUS | Status: AC | PRN
  Administered 2023-06-17: 500 [IU]

## 2023-06-17 MED ORDER — SODIUM CHLORIDE 0.9 % IV SOLN: INTRAVENOUS | Status: DC

## 2023-06-17 MED ORDER — PROCHLORPERAZINE MALEATE 10 MG PO TABS
10.0000 mg | ORAL_TABLET | Freq: Four times a day (QID) | ORAL | 0 refills | Status: DC | PRN
  Filled 2023-06-17: qty 30, 8d supply, fill #0

## 2023-06-17 MED ORDER — SODIUM CHLORIDE 0.9% FLUSH
10.0000 mL | Freq: Once | INTRAVENOUS | Status: AC
  Administered 2023-06-17: 10 mL

## 2023-06-17 NOTE — Progress Notes (Signed)
 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  CT abdomen/pelvis showed shows a mass in the left abdomen associated with the small bowel, along with pneumoperitoneum. Underwent exploratory laparotomy and small bowel resection. 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  04/23/2023: Established care with [facility] Hematology/Oncology. 05/26/2023: Cycle 1 Day of CHOP chemotherapy 06/17/2023: Cycle 2 Day 1 of CHOP chemotherapy  Interval History:  Elijah [name] 68 y.o. male with medical history significant for T cell lymphoma who presents for a follow up visit. The patient's last visit was on 05/19/2023. In the interim since the last visit his drain has been re-inserted due to concern for development of an abscess.   On exam today Elijah [name] reports has been well overall after his discharge from the hospital.  He reports he does continue to have some fatigue as well as some nausea but no vomiting or diarrhea.  He reports he did have some burning in the sinuses during his last chemotherapy treatment.  He reports his appetite levels are "so-so".  He is doing his best to try to keep up with fluid intake.  He has he is a little bit of numbness in his fingers but not his toes.  He reports that he would rank it he states 1 out of 10.  He reports that the wound VAC is currently off his abdomen but he does have a drain in place.  He reports that overall he is willing and able to proceed with treatment today.Marland Kitchen  He reports otherwise he has had no fevers,  chills, sweats, nausea, vomiting or diarrhea.  His drain is currently putting out 25 to 30 cc of beige-colored fluid per day.  Today we discussed options moving forward.  We discussed that continuing chemotherapy was risky while he had a drain in place.  We noted that he is at high risk for infection but that we are willing and able to proceed if he understands the risks.  After weighing the risks and benefits we were agreeable to proceeding with treatment.   MEDICAL HISTORY:  Past Medical History:  Diagnosis Date   Acid reflux    Allergic rhinitis    Asthma    Bloating    BPH (benign prostatic hyperplasia)    Depression    ED (erectile dysfunction)    Epigastric pain    Exercise-induced asthma    GERD (gastroesophageal reflux disease)    History of kidney stones    Hypercholesteremia    Migraine 2009   Polymyalgia rheumatica (HCC)    Prediabetes    started on metformin during january hospitalization   Prostate disease    Sleep apnea     SURGICAL HISTORY: Past Surgical History:  Procedure Laterality Date   ANKLE SURGERY Right    BOWEL RESECTION N/A 04/10/2023   Procedure: SMALL BOWEL RESECTION WITH MASS;  Surgeon: Moise Boring, MD;  Location: [facility] OR;  Service: General;  Laterality: N/A;   CHEST TUBE INSERTION Left    COLONOSCOPY     CYSTOSCOPY WITH RETROGRADE PYELOGRAM, URETEROSCOPY AND STENT PLACEMENT Left 06/29/2020   Procedure: CYSTOSCOPY WITH RETROGRADE PYELOGRAM, URETEROSCOPY , BASKET EXTRACTION, AND STENT PLACEMENT;  Surgeon: Sebastian Ache, MD;  Location: [facility] ORS;  Service: Urology;  Laterality: Left;  1 HR   IR IMAGING GUIDED PORT INSERTION  05/13/2023   KNEE SURGERY Right    LAPAROTOMY N/A 04/10/2023   Procedure: EXPLORATORY LAPAROTOMY;  Surgeon: Moise Boring, MD;  Location: [facility] OR;  Service: General;  Laterality: N/A;   SKIN CANCER EXCISION     unsure   TONSILLECTOMY     VASECTOMY     WISDOM TOOTH EXTRACTION      SOCIAL HISTORY: Social History    Socioeconomic History   Marital status: Married    Spouse name: Not on file   Number of children: Not on file   Years of education: Not on file   Highest education level: Not on file  Occupational History   Not on file  Tobacco Use   Smoking status: Never    Passive exposure: Never   Smokeless tobacco: Never  Vaping Use   Vaping status: Never Used  Substance and Sexual Activity   Alcohol use: Yes    Comment: social   Drug use: Never   Sexual activity: Not on file  Other Topics Concern   Not on file  Social History Narrative   Not on file   Social Drivers of Health   Financial Resource Strain: Not on file  Food Insecurity: No Food Insecurity (06/03/2023)   Hunger Vital Sign    Worried About Running Out of Food in the Last Year: Never true    Ran Out of Food in the Last Year: Never true  Transportation Needs: No Transportation Needs (06/03/2023)   PRAPARE - Administrator, Civil Service (Medical): No    Lack of Transportation (Non-Medical): No  Physical Activity: Not on file  Stress: Not on file  Social Connections: Socially Integrated (06/04/2023)   Social Connection and Isolation Panel [NHANES]    Frequency of Communication with Friends and Family: More than three times a week    Frequency of Social Gatherings with Friends and Family: Three times a week    Attends Religious Services: More than 4 times per year    Active Member of Clubs or Organizations: No    Attends Banker Meetings: 1 to 4 times per year    Marital Status: Married  Catering manager Violence: Not At Risk (06/03/2023)   Humiliation, Afraid, Rape, and Kick questionnaire    Fear of Current or Ex-Partner: No    Emotionally Abused: No    Physically Abused: No    Sexually Abused: No    FAMILY HISTORY: Family History  Problem Relation Age of Onset   Healthy Son    Healthy Daughter     ALLERGIES:  is allergic to singulair [montelukast], amlodipine, and crestor  [rosuvastatin].  MEDICATIONS:  Current Outpatient Medications  Medication Sig Dispense Refill   ondansetron (ZOFRAN) 8 MG tablet Take 1 tablet (8 mg total) by mouth every 8 (eight) hours as needed. 30 tablet 0   Accu-Chek Softclix Lancets lancets Use daily before breakfast. 100 each 0   albuterol (VENTOLIN HFA) 108 (90 Base) MCG/ACT inhaler Inhale 2 puffs into the lungs as needed (max of 12 puffs per day) 6.7 g 0   alfuzosin (UROXATRAL) 10 MG 24 hr tablet Take 1 tablet (10 mg total) by mouth daily. 90 tablet 1   allopurinol (ZYLOPRIM) 300 MG tablet Take 1 tablet (300 mg total) by mouth daily. 90 tablet 1   amoxicillin-clavulanate (AUGMENTIN) 875-125 MG tablet Take 1  tablet (875 mg total) by mouth every 12 (twelve) hours for 120 days 60 tablet 3   Blood Glucose Monitoring Suppl (ACCU-CHEK GUIDE) w/Device KIT Use daily before breakfast. 1 kit 0   CINNAMON PO Take 1 capsule by mouth daily.     finasteride (PROSCAR) 5 MG tablet Take 1 tablet (5 mg total) by mouth daily. 90 tablet 3   glucose blood (ACCU-CHEK GUIDE TEST) test strip Use daily before breakfast. 50 each 0   Glucose Blood (BLOOD GLUCOSE TEST STRIPS) STRP Use to check blood sugar before breakfast as directed 100 strip 0   lidocaine-prilocaine (EMLA) cream Apply 1 Application topically as needed. 30 g 0   loratadine (CLARITIN) 10 MG tablet Take 10 mg by mouth daily.     methocarbamol (ROBAXIN) 500 MG tablet Take 2 tablets (1,000 mg total) by mouth every 8 (eight) hours as needed for muscle spasms. 60 tablet 1   minoxidil (LONITEN) 2.5 MG tablet Take 1 tablet (2.5 mg total) by mouth daily. 365 tablet 1   oxyCODONE (OXY IR/ROXICODONE) 5 MG immediate release tablet Take 1-2 tablets (5-10 mg total) by mouth every 6 (six) hours as needed for severe pain (pain score 7-10). 180 tablet 0   pantoprazole (PROTONIX) 40 MG tablet Take 1 tablet (40 mg total) by mouth daily 30 minutes to 1 hour before morning meal. 30 tablet 5   predniSONE (DELTASONE)  1 MG tablet Take 3 tablets by mouth daily, along with a 5mg  tablet for a total of 8mg  daily for 1 month. Taper by 1mg  monthly. 90 tablet 0   predniSONE (DELTASONE) 20 MG tablet Take 3 tablets (60 mg total) by mouth daily with breakfast. Take for 5 days starting day one of chemotherapy 15 tablet 5   Probiotic TBEC Take 1 capsule by mouth daily.     prochlorperazine (COMPAZINE) 10 MG tablet Take 1 tablet (10 mg total) by mouth every 6 (six) hours as needed for nausea or vomiting. 30 tablet 0   No current facility-administered medications for this visit.   Facility-Administered Medications Ordered in Other Visits  Medication Dose Route Frequency Provider Last Rate Last Admin   0.9 %  sodium chloride infusion   Intravenous Continuous Jaci Standard, MD 10 mL/hr at 06/17/23 0948 New Bag at 06/17/23 0948   cyclophosphamide (CYTOXAN) 1,740 mg in sodium chloride 0.9 % 250 mL chemo infusion  750 mg/m2 (Treatment Plan Recorded) Intravenous Once Jaci Standard, MD       DOXOrubicin (ADRIAMYCIN) chemo injection 116 mg  50 mg/m2 (Treatment Plan Recorded) Intravenous Once Ulysees Barns IV, MD   116 mg at 06/17/23 1103   heparin lock flush 100 unit/mL  500 Units Intracatheter Once PRN Ulysees Barns IV, MD       sodium chloride flush (NS) 0.9 % injection 10 mL  10 mL Intracatheter PRN Ulysees Barns IV, MD       vinCRIStine (ONCOVIN) 2 mg in sodium chloride 0.9 % 50 mL chemo infusion  2 mg Intravenous Once Jaci Standard, MD        REVIEW OF SYSTEMS:   All other systems were reviewed with the patient and are negative.  PHYSICAL EXAMINATION: ECOG PERFORMANCE STATUS: 1 - Symptomatic but completely ambulatory  There were no vitals filed for this visit.  There were no vitals filed for this visit.   GENERAL: Well-appearing middle-age Caucasian male, alert, no distress and comfortable SKIN: skin color, texture, turgor are normal, no  rashes or significant lesions EYES: conjunctiva are pink and  non-injected, sclera clear LUNGS: clear to auscultation and percussion with normal breathing effort HEART: regular rate & rhythm and no murmurs and no lower extremity edema ABDOMEN: soft, non-tender, non-distended, normal bowel sounds.  Drain in place in abdomen. Musculoskeletal: no cyanosis of digits and no clubbing  PSYCH: alert & oriented x 3, fluent speech NEURO: no focal motor/sensory deficits  LABORATORY DATA:  I have reviewed the data as listed    Latest Ref Rng & Units 06/17/2023    8:38 AM 06/07/2023    5:34 AM 06/06/2023    4:58 AM  CBC  WBC 4.0 - 10.5 K/uL 13.4  22.2  19.0   Hemoglobin 13.0 - 17.0 g/dL 11.9  14.7  82.9   Hematocrit 39.0 - 52.0 % 36.1  31.8  32.3   Platelets 150 - 400 K/uL 381  202  190        Latest Ref Rng & Units 06/17/2023    8:38 AM 06/07/2023    5:34 AM 06/06/2023    4:58 AM  CMP  Glucose 70 - 99 mg/dL 562  130  865   BUN 8 - 23 mg/dL 27  12  14    Creatinine 0.61 - 1.24 mg/dL 7.84  6.96  2.95   Sodium 135 - 145 mmol/L 140  136  141   Potassium 3.5 - 5.1 mmol/L 3.6  3.3  3.4   Chloride 98 - 111 mmol/L 107  103  106   CO2 22 - 32 mmol/L 25  25  25    Calcium 8.9 - 10.3 mg/dL 9.5  8.5  8.1   Total Protein 6.5 - 8.1 g/dL 6.8     Total Bilirubin 0.0 - 1.2 mg/dL 0.3     Alkaline Phos 38 - 126 U/L 65     AST 15 - 41 U/L 13     ALT 0 - 44 U/L 34       No results found for: "MPROTEIN" No results found for: "KPAFRELGTCHN", "LAMBDASER", "KAPLAMBRATIO"  RADIOGRAPHIC STUDIES: I have personally reviewed the radiological images as listed and agreed with the findings in the report. CT GUIDED PERITONEAL/RETROPERITONEAL FLUID DRAIN BY PERC CATH Result Date: 06/05/2023 INDICATION: [id number] Abdominal fluid collection [phone] Briefly, history of T-cell lymphoma with necrotic mass at LEFT lower quadrant EXAM: CT-GUIDED DRAINAGE CATHETER PLACEMENT INTO LEFT ABDOMINAL ABSCESS COMPARISON:  CT AP, 06/03/2023. MEDICATIONS: The patient is currently admitted to the hospital and  receiving intravenous antibiotics. The antibiotics were administered within an appropriate time frame prior to the initiation of the procedure. ANESTHESIA/SEDATION: Moderate (conscious) sedation was employed during this procedure. A total of Versed 4 mg and Fentanyl 100 mcg was administered intravenously. Moderate Sedation Time: 16 minutes. The patient's level of consciousness and vital signs were monitored continuously by radiology nursing throughout the procedure under my direct supervision. CONTRAST:  None FLUOROSCOPY TIME:  CT dose; 1206 mGycm COMPLICATIONS: None immediate. PROCEDURE: RADIATION DOSE REDUCTION: This exam was performed according to the departmental dose-optimization program which includes automated exposure control, adjustment of the mA and/or kV according to patient size and/or use of iterative reconstruction technique. Informed written consent was obtained from the patient after a discussion of the risks, benefits and alternatives to treatment. The patient was placed supine on the CT gantry and a pre procedural CT was performed re-demonstrating the known abscess/fluid collection within the LEFT abdomen. The procedure was planned. A timeout was performed prior to the initiation of the  procedure. The LEFT was prepped and draped in the usual sterile fashion. The overlying soft tissues were anesthetized with 1% lidocaine with epinephrine. Appropriate trajectory was planned with the use of a 22 gauge spinal needle. An 18 gauge trocar needle was advanced into the abscess/fluid collection and a short Amplatz super stiff wire was coiled within the collection. Appropriate positioning was confirmed with a limited CT scan. The tract was serially dilated allowing placement of a 12 Fr drainage catheter. Appropriate positioning was confirmed with a limited postprocedural CT scan. 30 ml of purulent fluid was aspirated. The tube was connected to a drainage bag and sutured in place. A dressing was placed. The  patient tolerated the procedure well without immediate post procedural complication. IMPRESSION: Successful CT guided placement of a 12 Fr drainage catheter into the LEFT lower quadrant abscess with aspiration of 30 mL of purulent fluid. Samples were sent to the laboratory as requested by the ordering clinical team. Roanna Banning, MD [facility] Radiology Electronically Signed   By: Roanna Banning M.D.   On: 06/05/2023 18:27   CT ABDOMEN PELVIS W CONTRAST Result Date: 06/04/2023 CLINICAL DATA:  History of recent small bowel resection with ongoing nausea, initial encounter EXAM: CT ABDOMEN AND PELVIS WITH CONTRAST TECHNIQUE: Multidetector CT imaging of the abdomen and pelvis was performed using the standard protocol following bolus administration of intravenous contrast. RADIATION DOSE REDUCTION: This exam was performed according to the departmental dose-optimization program which includes automated exposure control, adjustment of the mA and/or kV according to patient size and/or use of iterative reconstruction technique. CONTRAST:  OMNIPAQUE IOHEXOL 300 MG/ML  SOLN COMPARISON:  05/08/2023 FINDINGS: Lower chest: No acute abnormality. Hepatobiliary: No focal liver abnormality is seen. No gallstones, gallbladder wall thickening, or biliary dilatation. Pancreas: Unremarkable. No pancreatic ductal dilatation or surrounding inflammatory changes. Spleen: Normal in size without focal abnormality. Adrenals/Urinary Tract: Adrenal glands are within normal limits bilaterally. Kidneys demonstrate a normal enhancement pattern. Bilateral renal cysts are noted. No follow-up is recommended. No obstructive changes are seen. The bladder is partially distended. Stomach/Bowel: No obstructive or inflammatory changes of the colon are noted. The appendix is within normal limits. Surgical changes are noted in the small bowel. No obstructive changes are seen. Stomach is within normal  limits. Vascular/Lymphatic: Mild vascular calcifications are noted. Scattered small mesenteric lymph nodes are noted. A large centrally necrotic mass lesion is noted within the mesentery to the left of the midline similar to that seen on the prior exam. Air is noted within now consistent with increasing infection. The mass now measures approximately 11.5 x 9.1 cm. Reproductive: Prostate is unremarkable. Other: No abdominal wall hernia or abnormality. No abdominopelvic ascites. Musculoskeletal: No acute or significant osseous findings. IMPRESSION: Large centrally necrotic mass lesion is again identified within the mesentery. Increased air is noted within likely related to super infection. Previously seen surgical drain has been removed. No other focal abnormality is noted. Electronically Signed   By: Alcide Clever M.D.   On: 06/04/2023 01:42   DG CHEST PORT 1 VIEW Result Date: 06/03/2023 CLINICAL DATA:  Neutropenic fever EXAM: PORTABLE CHEST 1 VIEW COMPARISON:  05/02/2023 PET-CT FINDINGS: Right chest wall port is noted in satisfactory position. Cardiac shadow is within normal limits. The lungs are well aerated without focal infiltrate. No bony abnormality is seen. IMPRESSION: No acute abnormality noted. Electronically Signed   By: Alcide Clever M.D.   On: 06/03/2023 21:09    ASSESSMENT & PLAN Elijah Setters  [name] 68 y.o. male with medical history significant for T cell lymphoma who presents for a follow up visit.   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 Mesenteric margin is positive.  --Discussed MEITL is a rare subtype of peripheral T-cell lymphoma, approximately less than 5% of cases.  --Reviewed that the treatment  strategy will include CHOP based chemotherapy. Reviewed frequency and common side effects.  -- baseline PET imaging complete on 05/02/2023.  PLAN: --Labs today show white blood cell 13.4, hemoglobin 11.6, MCV 89.1, platelets 381..  Creatinine and LFTs within normal limits. --proceed with Cycle 2 Day 1 of CHOP.  --Plan for repeat PET CT scan after Cycle 3 (before Cycle 4 ideally).  --RTC for next cycle in 3 weeks with interval GCSF shot.    #Abdominal pain: --Improving after surgery.  --Currently on oxycodone 5-10 mg q 4 hours, refill sent today.  #Supportive Care -- chemotherapy education complete -- port placed --TTE complete, adequate for treatment.  -- zofran 8mg  q8H PRN and compazine 10mg  PO q6H for nausea -- acyclovir 400mg  PO BID for VCZ prophylaxis -- allopurinol 300mg  PO daily for TLS prophylaxis -- EMLA cream for port   Orders Placed This Encounter  Procedures   Uric acid    Standing Status:   Future    Number of Occurrences:   1    Expected Date:   06/17/2023    Expiration Date:   06/16/2024   Lactate dehydrogenase (LDH)    Standing Status:   Future    Number of Occurrences:   1    Expected Date:   06/17/2023    Expiration Date:   06/16/2024   Uric acid    Standing Status:   Future    Expected Date:   07/07/2023    Expiration Date:   07/06/2024   Lactate dehydrogenase (LDH)    Standing Status:   Future    Expected Date:   07/07/2023    Expiration Date:   07/06/2024   CBC with Differential (Cancer Center Only)    Standing Status:   Future    Expected Date:   07/07/2023    Expiration Date:   07/06/2024   CMP (Cancer Center only)    Standing Status:   Future    Expected Date:   07/07/2023    Expiration Date:   07/06/2024   Uric acid    Standing Status:   Future    Expected Date:   07/28/2023    Expiration Date:   07/27/2024   Lactate dehydrogenase (LDH)    Standing Status:   Future    Expected Date:   07/28/2023    Expiration Date:   07/27/2024   CBC with Differential (Cancer  Center Only)    Standing Status:   Future    Expected Date:   07/28/2023    Expiration Date:   07/27/2024   CMP (Cancer Center only)    Standing Status:   Future    Expected Date:   07/28/2023    Expiration Date:   07/27/2024   Uric acid    Standing Status:   Future    Expected Date:   08/18/2023    Expiration Date:   08/17/2024   Lactate dehydrogenase (LDH)    Standing Status:   Future    Expected Date:  08/18/2023    Expiration Date:   08/17/2024   CBC with Differential (Cancer Center Only)    Standing Status:   Future    Expected Date:   08/18/2023    Expiration Date:   08/17/2024   CMP (Cancer Center only)    Standing Status:   Future    Expected Date:   08/18/2023    Expiration Date:   08/17/2024   Uric acid    Standing Status:   Future    Expected Date:   09/08/2023    Expiration Date:   09/07/2024   Lactate dehydrogenase (LDH)    Standing Status:   Future    Expected Date:   09/08/2023    Expiration Date:   09/07/2024   CBC with Differential (Cancer Center Only)    Standing Status:   Future    Expected Date:   09/08/2023    Expiration Date:   09/07/2024   CMP (Cancer Center only)    Standing Status:   Future    Expected Date:   09/08/2023    Expiration Date:   09/07/2024    All questions were answered. The patient knows to call the clinic with any problems, questions or concerns.  A total of more than 30 minutes were spent on this encounter with face-to-face time and non-face-to-face time, including preparing to see the patient, ordering tests and/or medications, counseling the patient and coordination of care as outlined above.   Ulysees Barns, MD Department of Hematology/Oncology [facility] Phone: [phone] Pager: [phone] Email: [email or web address]  06/17/2023 11:12 AM

## 2023-06-17 NOTE — Progress Notes (Signed)
 Nutrition follow-up completed with patient and wife.  Patient is receiving CHOP for non-Hodgkin's lymphoma and is followed by Dr. Leonides Schanz.  Weight stable and documented as 226 pounds 8 ounces on March 18.  Labs include glucose 182, BUN 27, and albumin 3.8.  Patient reports he no longer has a wound VAC.  He does have drains which are limiting his ability to increase his physical activity.  He is pleased with progress.  He drinks Juven once daily.  Reports taste alterations and states his teeth have been more sensitive.  He continues to have some nausea however he is taking antiemetics.  Nutrition diagnosis unintended weight loss is stable.  Intervention: Continue small frequent meals and snacks with adequate calories and protein. Provided education on taste alterations and gave patient handout.  Encouraged rinsing mouth out with baking soda and salt water. Continue Juven to support wound healing.  Recommend twice daily.  Provided samples and coupon. Continue antiemetics as needed.  Monitoring, evaluation, goals: Patient will tolerate adequate calories and protein to minimize weight loss and promote healing.  Next visit: To be scheduled with upcoming treatment as needed.  Patient has RD contact information.  **Disclaimer: This note was dictated with voice recognition software. Similar sounding words can inadvertently be transcribed and this note may contain transcription errors which may not have been corrected upon publication of note.**

## 2023-06-17 NOTE — Patient Instructions (Signed)
 [facility] - A DEPT OF [facility]  Discharge Instructions: Thank you for choosing [facility] to provide your oncology and hematology care.   If you have a lab appointment with the Cancer Center, please go directly to the Cancer Center and check in at the registration area.   Wear comfortable clothing and clothing appropriate for easy access to any Portacath or PICC line.   We strive to give you quality time with your provider. You may need to reschedule your appointment if you arrive late (15 or more minutes).  Arriving late affects you and other patients whose appointments are after yours.  Also, if you miss three or more appointments without notifying the office, you may be dismissed from the clinic at the provider's discretion.      For prescription refill requests, have your pharmacy contact our office and allow 72 hours for refills to be completed.    Today you received the following chemotherapy and/or immunotherapy agents: Adriamycin/Oncovin/Cytoxan      To help prevent nausea and vomiting after your treatment, we encourage you to take your nausea medication as directed.  BELOW ARE SYMPTOMS THAT SHOULD BE REPORTED IMMEDIATELY: *FEVER GREATER THAN 100.4 F (38 C) OR HIGHER *CHILLS OR SWEATING *NAUSEA AND VOMITING THAT IS NOT CONTROLLED WITH YOUR NAUSEA MEDICATION *UNUSUAL SHORTNESS OF BREATH *UNUSUAL BRUISING OR BLEEDING *URINARY PROBLEMS (pain or burning when urinating, or frequent urination) *BOWEL PROBLEMS (unusual diarrhea, constipation, pain near the anus) TENDERNESS IN MOUTH AND THROAT WITH OR WITHOUT PRESENCE OF ULCERS (sore throat, sores in mouth, or a toothache) UNUSUAL RASH, SWELLING OR PAIN  UNUSUAL VAGINAL DISCHARGE OR ITCHING   Items with * indicate a potential emergency and should be followed up as soon as possible or go to the Emergency Department if any problems should occur.  Please show the CHEMOTHERAPY ALERT CARD  or IMMUNOTHERAPY ALERT CARD at check-in to the Emergency Department and triage nurse.  Should you have questions after your visit or need to cancel or reschedule your appointment, please contact [facility] - A DEPT OF [facility]  Dept: [phone]  and follow the prompts.  Office hours are 8:00 a.m. to 4:30 p.m. Monday - Friday. Please note that voicemails left after 4:00 p.m. may not be returned until the following business day.  We are closed weekends and major holidays. You have access to a nurse at all times for urgent questions. Please call the main number to the clinic Dept: [phone] and follow the prompts.   For any non-urgent questions, you may also contact your provider using MyChart. We now offer e-Visits for anyone 34 and older to request care online for non-urgent symptoms. For details visit [email or web address].   Also download the MyChart app! Go to the app store, search "MyChart", open the app, select [place], and log in with your MyChart username and password.

## 2023-06-18 ENCOUNTER — Other Ambulatory Visit: Payer: Self-pay

## 2023-06-19 ENCOUNTER — Inpatient Hospital Stay: Payer: Medicare Other

## 2023-06-19 VITALS — BP 147/83 | HR 80 | Temp 98.0°F | Resp 18

## 2023-06-19 DIAGNOSIS — Z5111 Encounter for antineoplastic chemotherapy: Secondary | ICD-10-CM | POA: Diagnosis not present

## 2023-06-19 DIAGNOSIS — C8449 Peripheral T-cell lymphoma, not classified, extranodal and solid organ sites: Secondary | ICD-10-CM

## 2023-06-19 MED ORDER — PEGFILGRASTIM-JMDB 6 MG/0.6ML ~~LOC~~ SOSY
6.0000 mg | PREFILLED_SYRINGE | Freq: Once | SUBCUTANEOUS | Status: AC
  Administered 2023-06-19: 6 mg via SUBCUTANEOUS
  Filled 2023-06-19: qty 0.6

## 2023-06-24 ENCOUNTER — Other Ambulatory Visit: Payer: Self-pay | Admitting: Hematology and Oncology

## 2023-06-26 ENCOUNTER — Other Ambulatory Visit (HOSPITAL_COMMUNITY): Payer: Self-pay

## 2023-06-30 ENCOUNTER — Telehealth: Payer: Self-pay | Admitting: *Deleted

## 2023-06-30 ENCOUNTER — Other Ambulatory Visit: Payer: Self-pay | Admitting: Hematology and Oncology

## 2023-06-30 NOTE — Telephone Encounter (Signed)
 Received vm message form pt's home health nurse, Beth from [place]. She saw pt today. He told her he has a fall over the weekend. He was in a hurry to use the bathroom and missed that last step on his stairs. He hurt his right knee a bit but did not require ED visit.  She said his knee his still a bit sore but otherwise he is ok.

## 2023-07-04 MED FILL — Fosaprepitant Dimeglumine For IV Infusion 150 MG (Base Eq): INTRAVENOUS | Qty: 5 | Status: AC

## 2023-07-07 ENCOUNTER — Inpatient Hospital Stay

## 2023-07-07 ENCOUNTER — Inpatient Hospital Stay (HOSPITAL_BASED_OUTPATIENT_CLINIC_OR_DEPARTMENT_OTHER): Admitting: Hematology and Oncology

## 2023-07-07 ENCOUNTER — Encounter: Payer: Self-pay | Admitting: Hematology and Oncology

## 2023-07-07 ENCOUNTER — Inpatient Hospital Stay: Attending: Physician Assistant

## 2023-07-07 VITALS — BP 147/99 | HR 89 | Temp 98.7°F | Resp 20 | Wt 235.0 lb

## 2023-07-07 DIAGNOSIS — C8449 Peripheral T-cell lymphoma, not classified, extranodal and solid organ sites: Secondary | ICD-10-CM

## 2023-07-07 DIAGNOSIS — Z95828 Presence of other vascular implants and grafts: Secondary | ICD-10-CM

## 2023-07-07 DIAGNOSIS — Z5111 Encounter for antineoplastic chemotherapy: Secondary | ICD-10-CM | POA: Insufficient documentation

## 2023-07-07 DIAGNOSIS — Z79899 Other long term (current) drug therapy: Secondary | ICD-10-CM | POA: Insufficient documentation

## 2023-07-07 DIAGNOSIS — Z5189 Encounter for other specified aftercare: Secondary | ICD-10-CM | POA: Diagnosis not present

## 2023-07-07 LAB — CMP (CANCER CENTER ONLY)
ALT: 26 U/L (ref 0–44)
AST: 13 U/L — ABNORMAL LOW (ref 15–41)
Albumin: 3.9 g/dL (ref 3.5–5.0)
Alkaline Phosphatase: 65 U/L (ref 38–126)
Anion gap: 8 (ref 5–15)
BUN: 25 mg/dL — ABNORMAL HIGH (ref 8–23)
CO2: 24 mmol/L (ref 22–32)
Calcium: 9.4 mg/dL (ref 8.9–10.3)
Chloride: 111 mmol/L (ref 98–111)
Creatinine: 1.17 mg/dL (ref 0.61–1.24)
GFR, Estimated: >60 mL/min (ref >60)
Glucose, Bld: 175 mg/dL — ABNORMAL HIGH (ref 70–99)
Potassium: 3.5 mmol/L (ref 3.5–5.1)
Sodium: 143 mmol/L (ref 135–145)
Total Bilirubin: 0.2 mg/dL (ref 0.0–1.2)
Total Protein: 6.8 g/dL (ref 6.5–8.1)

## 2023-07-07 LAB — CBC WITH DIFFERENTIAL (CANCER CENTER ONLY)
Abs Immature Granulocytes: 0.24 10*3/uL — ABNORMAL HIGH (ref 0.00–0.07)
Basophils Absolute: 0.2 10*3/uL — ABNORMAL HIGH (ref 0.0–0.1)
Basophils Relative: 3 %
Eosinophils Absolute: 0.1 10*3/uL (ref 0.0–0.5)
Eosinophils Relative: 2 %
HCT: 36.8 % — ABNORMAL LOW (ref 39.0–52.0)
Hemoglobin: 11.6 g/dL — ABNORMAL LOW (ref 13.0–17.0)
Immature Granulocytes: 4 %
Lymphocytes Relative: 16 %
Lymphs Abs: 1.1 10*3/uL (ref 0.7–4.0)
MCH: 28.6 pg (ref 26.0–34.0)
MCHC: 31.5 g/dL (ref 30.0–36.0)
MCV: 90.9 fL (ref 80.0–100.0)
Monocytes Absolute: 0.9 10*3/uL (ref 0.1–1.0)
Monocytes Relative: 13 %
Neutro Abs: 4 10*3/uL (ref 1.7–7.7)
Neutrophils Relative %: 62 %
Platelet Count: 290 10*3/uL (ref 150–400)
RBC: 4.05 MIL/uL — ABNORMAL LOW (ref 4.22–5.81)
RDW: 19.6 % — ABNORMAL HIGH (ref 11.5–15.5)
WBC Count: 6.5 10*3/uL (ref 4.0–10.5)
nRBC: 0 % (ref 0.0–0.2)

## 2023-07-07 LAB — LACTATE DEHYDROGENASE: LDH: 177 U/L (ref 98–192)

## 2023-07-07 LAB — URIC ACID: Uric Acid, Serum: 5.1 mg/dL (ref 3.7–8.6)

## 2023-07-07 MED ORDER — SODIUM CHLORIDE 0.9 % IV SOLN
INTRAVENOUS | Status: DC
Start: 2023-07-07 — End: 2023-07-07

## 2023-07-07 MED ORDER — VINCRISTINE SULFATE CHEMO INJECTION 1 MG/ML
2.0000 mg | Freq: Once | INTRAVENOUS | Status: AC
  Administered 2023-07-07: 2 mg via INTRAVENOUS
  Filled 2023-07-07: qty 2

## 2023-07-07 MED ORDER — DOXORUBICIN HCL CHEMO IV INJECTION 2 MG/ML
50.0000 mg/m2 | Freq: Once | INTRAVENOUS | Status: AC
Start: 2023-07-07 — End: 2023-07-07
  Administered 2023-07-07: 116 mg via INTRAVENOUS
  Filled 2023-07-07: qty 58

## 2023-07-07 MED ORDER — PALONOSETRON HCL INJECTION 0.25 MG/5ML
0.2500 mg | Freq: Once | INTRAVENOUS | Status: AC
  Administered 2023-07-07: 0.25 mg via INTRAVENOUS
  Filled 2023-07-07: qty 5

## 2023-07-07 MED ORDER — DEXAMETHASONE SODIUM PHOSPHATE 10 MG/ML IJ SOLN
10.0000 mg | Freq: Once | INTRAMUSCULAR | Status: AC
  Administered 2023-07-07: 10 mg via INTRAVENOUS
  Filled 2023-07-07: qty 1

## 2023-07-07 MED ORDER — HEPARIN SOD (PORK) LOCK FLUSH 100 UNIT/ML IV SOLN
500.0000 [IU] | Freq: Once | INTRAVENOUS | Status: AC | PRN
  Administered 2023-07-07: 500 [IU]

## 2023-07-07 MED ORDER — SODIUM CHLORIDE 0.9 % IV SOLN
750.0000 mg/m2 | Freq: Once | INTRAVENOUS | Status: AC
  Administered 2023-07-07: 1740 mg via INTRAVENOUS
  Filled 2023-07-07: qty 87

## 2023-07-07 MED ORDER — SODIUM CHLORIDE 0.9% FLUSH
10.0000 mL | INTRAVENOUS | Status: DC | PRN
  Administered 2023-07-07: 10 mL

## 2023-07-07 MED ORDER — SODIUM CHLORIDE 0.9 % IV SOLN
150.0000 mg | Freq: Once | INTRAVENOUS | Status: AC
  Administered 2023-07-07: 150 mg via INTRAVENOUS
  Filled 2023-07-07: qty 150

## 2023-07-07 NOTE — Patient Instructions (Signed)
 [facility]  Discharge Instructions: Thank you for choosing [facility] to provide your oncology and hematology care.   If you have a lab appointment with the Cancer Center, please go directly to the Cancer Center and check in at the registration area.   Wear comfortable clothing and clothing appropriate for easy access to any Portacath or PICC line.   We strive to give you quality time with your provider. You may need to reschedule your appointment if you arrive late (15 or more minutes).  Arriving late affects you and other patients whose appointments are after yours.  Also, if you miss three or more appointments without notifying the office, you may be dismissed from the clinic at the provider's discretion.      For prescription refill requests, have your pharmacy contact our office and allow 72 hours for refills to be completed.    Today you received the following chemotherapy and/or immunotherapy agents: DOXOrubicin (ADRIAMYCIN), cyclophosphamide (CYTOXAN) and  vinCRIStine (ONCOVIN)     To help prevent nausea and vomiting after your treatment, we encourage you to take your nausea medication as directed.  BELOW ARE SYMPTOMS THAT SHOULD BE REPORTED IMMEDIATELY: *FEVER GREATER THAN 100.4 F (38 C) OR HIGHER *CHILLS OR SWEATING *NAUSEA AND VOMITING THAT IS NOT CONTROLLED WITH YOUR NAUSEA MEDICATION *UNUSUAL SHORTNESS OF BREATH *UNUSUAL BRUISING OR BLEEDING *URINARY PROBLEMS (pain or burning when urinating, or frequent urination) *BOWEL PROBLEMS (unusual diarrhea, constipation, pain near the anus) TENDERNESS IN MOUTH AND THROAT WITH OR WITHOUT PRESENCE OF ULCERS (sore throat, sores in mouth, or a toothache) UNUSUAL RASH, SWELLING OR PAIN  UNUSUAL VAGINAL DISCHARGE OR ITCHING   Items with * indicate a potential emergency and should be followed up as soon as possible or go to the Emergency Department if any problems  should occur.  Please show the CHEMOTHERAPY ALERT CARD or IMMUNOTHERAPY ALERT CARD at check-in to the Emergency Department and triage nurse.  Should you have questions after your visit or need to cancel or reschedule your appointment, please contact [facility] A DE[facility]  Dept: [phone]  and follow the prompts.  Office hours are 8:00 a.m. to 4:30 p.m. Monday - Friday. Please note that voicemails left after 4:00 p.m. may not be returned until the following business day.  We are closed weekends and major holidays. You have access to a nurse at all times for urgent questions. Please call the main number to the clinic Dept: [phone] and follow the prompts.   For any non-urgent questions, you may also contact your provider using MyChart. We now offer e-Visits for anyone 83 and older to request care online for non-urgent symptoms. For details visit [email or web address].   Also download the MyChart app! Go to the app store, search "MyChart", open the app, select [place], and log in with your MyChart username and password.

## 2023-07-07 NOTE — Addendum Note (Signed)
 Addended by: Neita Goodnight on: 07/07/2023 03:43 PM   Modules accepted: Orders

## 2023-07-07 NOTE — Progress Notes (Unsigned)
 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  CT abdomen/pelvis showed shows a mass in the left abdomen associated with the small bowel, along with pneumoperitoneum. Underwent exploratory laparotomy and small bowel resection. 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  04/23/2023: Established care with [facility] Hematology/Oncology. 05/26/2023: Cycle 1 Day of CHOP chemotherapy 06/17/2023: Cycle 2 Day 1 of CHOP chemotherapy 07/07/2023: Cycle 3 Day 1 of CHOP chemotherapy  Interval History:  Elijah [name] 68 y.o. male with medical history significant for T cell lymphoma who presents for a follow up visit. The patient's last visit was on 05/19/2023. In the interim since the last visit his drain has been re-inserted due to concern for development of an abscess.   On exam today Elijah [name] reports that he has received a second opinion at [facility] and will be having his next PET CT scan up there in about 2 to 3 weeks time.  He reports that he has made contact with a T-cell expert at that location.  He reports that he had had some nausea with his last cycle of chemotherapy but no vomiting.  His bowels are moving well.  He reports his energy levels have been good and his appetite has been steady.  He reports that he is had no issues with runny nose, sore throat, cough, but has had some allergies.  He notes he is also been walking quite a bit, generally a little less than 1 mile per day.  He reports the drain is also putting out less fluid, barely any clear  fluid with some occasional pinkish tinged liquid.  He reports that overall he feels quite well and is willing and able to proceed with treatment today.  He otherwise denies any fevers, chills, sweats, nausea vomiting or diarrhea.  A full 10 point ROS was otherwise negative.  Previously we discussed options moving forward.  We discussed that continuing chemotherapy was risky while he had a drain in place.  We noted that he is at high risk for infection but that we are willing and able to proceed if he understands the risks.  After weighing the risks and benefits we were agreeable to proceeding with treatment.   MEDICAL HISTORY:  Past Medical History:  Diagnosis Date   Acid reflux    Allergic rhinitis    Asthma    Bloating    BPH (benign prostatic hyperplasia)    Depression    ED (erectile dysfunction)    Epigastric pain    Exercise-induced asthma    GERD (gastroesophageal reflux disease)    History of kidney stones    Hypercholesteremia    Migraine 2009   Polymyalgia rheumatica (HCC)    Prediabetes    started on metformin during january hospitalization   Prostate disease    Sleep apnea     SURGICAL HISTORY: Past Surgical History:  Procedure Laterality Date   ANKLE SURGERY Right    BOWEL RESECTION N/A 04/10/2023   Procedure: SMALL BOWEL RESECTION WITH MASS;  Surgeon: Moise Boring, MD;  Location: [facility] OR;  Service: General;  Laterality: N/A;   CHEST TUBE INSERTION Left    COLONOSCOPY  CYSTOSCOPY WITH RETROGRADE PYELOGRAM, URETEROSCOPY AND STENT PLACEMENT Left 06/29/2020   Procedure: CYSTOSCOPY WITH RETROGRADE PYELOGRAM, URETEROSCOPY , BASKET EXTRACTION, AND STENT PLACEMENT;  Surgeon: Sebastian Ache, MD;  Location: [facility];  Service: Urology;  Laterality: Left;  1 HR   IR IMAGING GUIDED PORT INSERTION  05/13/2023   KNEE SURGERY Right    LAPAROTOMY N/A 04/10/2023   Procedure: EXPLORATORY LAPAROTOMY;  Surgeon: Moise Boring, MD;  Location: [facility] OR;  Service: General;   Laterality: N/A;   SKIN CANCER EXCISION     unsure   TONSILLECTOMY     VASECTOMY     WISDOM TOOTH EXTRACTION      SOCIAL HISTORY: Social History   Socioeconomic History   Marital status: Married    Spouse name: Not on file   Number of children: Not on file   Years of education: Not on file   Highest education level: Not on file  Occupational History   Not on file  Tobacco Use   Smoking status: Never    Passive exposure: Never   Smokeless tobacco: Never  Vaping Use   Vaping status: Never Used  Substance and Sexual Activity   Alcohol use: Yes    Comment: social   Drug use: Never   Sexual activity: Not on file  Other Topics Concern   Not on file  Social History Narrative   Not on file   Social Drivers of Health   Financial Resource Strain: Not on file  Food Insecurity: No Food Insecurity (06/03/2023)   Hunger Vital Sign    Worried About Running Out of Food in the Last Year: Never true    Ran Out of Food in the Last Year: Never true  Transportation Needs: No Transportation Needs (06/03/2023)   PRAPARE - Administrator, Civil Service (Medical): No    Lack of Transportation (Non-Medical): No  Physical Activity: Not on file  Stress: Not on file  Social Connections: Socially Integrated (06/04/2023)   Social Connection and Isolation Panel [NHANES]    Frequency of Communication with Friends and Family: More than three times a week    Frequency of Social Gatherings with Friends and Family: Three times a week    Attends Religious Services: More than 4 times per year    Active Member of Clubs or Organizations: No    Attends Banker Meetings: 1 to 4 times per year    Marital Status: Married  Catering manager Violence: Not At Risk (06/03/2023)   Humiliation, Afraid, Rape, and Kick questionnaire    Fear of Current or Ex-Partner: No    Emotionally Abused: No    Physically Abused: No    Sexually Abused: No    FAMILY HISTORY: Family History  Problem  Relation Age of Onset   Healthy Son    Healthy Daughter     ALLERGIES:  is allergic to singulair [montelukast], amlodipine, and crestor [rosuvastatin].  MEDICATIONS:  Current Outpatient Medications  Medication Sig Dispense Refill   Accu-Chek Softclix Lancets lancets Use daily before breakfast. 100 each 0   albuterol (VENTOLIN HFA) 108 (90 Base) MCG/ACT inhaler Inhale 2 puffs into the lungs as needed (max of 12 puffs per day) 6.7 g 0   alfuzosin (UROXATRAL) 10 MG 24 hr tablet Take 1 tablet (10 mg total) by mouth daily. 90 tablet 1   allopurinol (ZYLOPRIM) 300 MG tablet Take 1 tablet (300 mg total) by mouth daily. 90 tablet 1   amoxicillin-clavulanate (AUGMENTIN) 875-125 MG tablet Take  1 tablet (875 mg total) by mouth every 12 (twelve) hours for 120 days 60 tablet 3   Blood Glucose Monitoring Suppl (ACCU-CHEK GUIDE) w/Device KIT Use daily before breakfast. 1 kit 0   CINNAMON PO Take 1 capsule by mouth daily.     finasteride (PROSCAR) 5 MG tablet Take 1 tablet (5 mg total) by mouth daily. 90 tablet 3   glucose blood (ACCU-CHEK GUIDE TEST) test strip Use daily before breakfast. 50 each 0   Glucose Blood (BLOOD GLUCOSE TEST STRIPS) STRP Use to check blood sugar before breakfast as directed 100 strip 0   lidocaine-prilocaine (EMLA) cream Apply 1 Application topically as needed. 30 g 0   loratadine (CLARITIN) 10 MG tablet Take 10 mg by mouth daily.     methocarbamol (ROBAXIN) 500 MG tablet Take 2 tablets (1,000 mg total) by mouth every 8 (eight) hours as needed for muscle spasms. 60 tablet 1   minoxidil (LONITEN) 2.5 MG tablet Take 1 tablet (2.5 mg total) by mouth daily. 365 tablet 1   ondansetron (ZOFRAN) 8 MG tablet Take 1 tablet (8 mg total) by mouth every 8 (eight) hours as needed. 30 tablet 0   oxyCODONE (OXY IR/ROXICODONE) 5 MG immediate release tablet Take 1-2 tablets (5-10 mg total) by mouth every 6 (six) hours as needed for severe pain (pain score 7-10). 180 tablet 0   pantoprazole  (PROTONIX) 40 MG tablet Take 1 tablet (40 mg total) by mouth daily 30 minutes to 1 hour before morning meal. 30 tablet 5   predniSONE (DELTASONE) 1 MG tablet Take 3 tablets by mouth daily, along with a 5mg  tablet for a total of 8mg  daily for 1 month. Taper by 1mg  monthly. 90 tablet 0   predniSONE (DELTASONE) 20 MG tablet Take 3 tablets (60 mg total) by mouth daily with breakfast. Take for 5 days starting day one of chemotherapy 15 tablet 5   Probiotic TBEC Take 1 capsule by mouth daily.     prochlorperazine (COMPAZINE) 10 MG tablet Take 1 tablet (10 mg total) by mouth every 6 (six) hours as needed for nausea or vomiting. 30 tablet 0   No current facility-administered medications for this visit.    REVIEW OF SYSTEMS:   All other systems were reviewed with the patient and are negative.  PHYSICAL EXAMINATION: ECOG PERFORMANCE STATUS: 1 - Symptomatic but completely ambulatory  There were no vitals filed for this visit.  There were no vitals filed for this visit.   GENERAL: Well-appearing middle-age Caucasian male, alert, no distress and comfortable SKIN: skin color, texture, turgor are normal, no rashes or significant lesions EYES: conjunctiva are pink and non-injected, sclera clear LUNGS: clear to auscultation and percussion with normal breathing effort HEART: regular rate & rhythm and no murmurs and no lower extremity edema ABDOMEN: soft, non-tender, non-distended, normal bowel sounds.  Drain in place in abdomen. Musculoskeletal: no cyanosis of digits and no clubbing  PSYCH: alert & oriented x 3, fluent speech NEURO: no focal motor/sensory deficits  LABORATORY DATA:  I have reviewed the data as listed    Latest Ref Rng & Units 07/07/2023    8:10 AM 06/17/2023    8:38 AM 06/07/2023    5:34 AM  CBC  WBC 4.0 - 10.5 K/uL 6.5  13.4  22.2   Hemoglobin 13.0 - 17.0 g/dL 45.4  09.8  11.9   Hematocrit 39.0 - 52.0 % 36.8  36.1  31.8   Platelets 150 - 400 K/uL 290  381  202  Latest  Ref Rng & Units 07/07/2023    8:10 AM 06/17/2023    8:38 AM 06/07/2023    5:34 AM  CMP  Glucose 70 - 99 mg/dL 409  811  914   BUN 8 - 23 mg/dL 25  27  12    Creatinine 0.61 - 1.24 mg/dL 7.82  9.56  2.13   Sodium 135 - 145 mmol/L 143  140  136   Potassium 3.5 - 5.1 mmol/L 3.5  3.6  3.3   Chloride 98 - 111 mmol/L 111  107  103   CO2 22 - 32 mmol/L 24  25  25    Calcium 8.9 - 10.3 mg/dL 9.4  9.5  8.5   Total Protein 6.5 - 8.1 g/dL 6.8  6.8    Total Bilirubin 0.0 - 1.2 mg/dL 0.2  0.3    Alkaline Phos 38 - 126 U/L 65  65    AST 15 - 41 U/L 13  13    ALT 0 - 44 U/L 26  34      No results found for: "MPROTEIN" No results found for: "KPAFRELGTCHN", "LAMBDASER", "KAPLAMBRATIO"  RADIOGRAPHIC STUDIES: I have personally reviewed the radiological images as listed and agreed with the findings in the report. No results found.   ASSESSMENT & PLAN Elijah [name] 68 y.o. male with medical history significant for T cell lymphoma who presents for a follow up visit.   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 Mesenteric margin is positive.  --Discussed MEITL is a rare subtype of peripheral T-cell lymphoma, approximately less than 5% of cases.  --Reviewed that the treatment strategy will include CHOP based chemotherapy. Reviewed frequency and common side effects.  -- baseline PET imaging complete on 05/02/2023.  PLAN: --Labs today show white blood cell 6.5, hemoglobin 9.6, MCV 90.9, platelets 290 creatinine and LFTs within normal limits. --proceed with Cycle 3 Day 1 of CHOP.  --Plan for repeat PET CT scan after Cycle 3 (before Cycle 4 ideally).  This will be performed at [facility] per the patient's request. --RTC for next  cycle in 3 weeks with interval GCSF shot.    #Abdominal pain: --Improving after surgery.  --Currently on oxycodone 5-10 mg q 4 hours, refill sent today.  #Supportive Care -- chemotherapy education complete -- port placed --TTE complete, adequate for treatment.  -- zofran 8mg  q8H PRN and compazine 10mg  PO q6H for nausea -- acyclovir 400mg  PO BID for VCZ prophylaxis -- allopurinol 300mg  PO daily for TLS prophylaxis -- EMLA cream for port   No orders of the defined types were placed in this encounter.   All questions were answered. The patient knows to call the clinic with any problems, questions or concerns.  A total of more than 30 minutes were spent on this encounter with face-to-face time and non-face-to-face time, including preparing to see the patient, ordering tests and/or medications, counseling the patient and coordination of care as outlined above.   Ulysees Barns, MD Department of Hematology/Oncology [facility] at [facility] Phone: [phone] Pager: [phone] Email: [email or web address]  07/08/2023 3:59 PM

## 2023-07-07 NOTE — Progress Notes (Signed)
 Changed Fulphila to Neulasta per insurance auth'd and preferred biosimilar.  Anola Gurney [place], [place], BCPS, BCOP 07/07/2023 3:32 PM

## 2023-07-08 ENCOUNTER — Encounter: Payer: Self-pay | Admitting: Hematology and Oncology

## 2023-07-09 ENCOUNTER — Inpatient Hospital Stay

## 2023-07-09 VITALS — BP 142/75 | HR 76 | Temp 98.7°F | Resp 18

## 2023-07-09 DIAGNOSIS — Z5111 Encounter for antineoplastic chemotherapy: Secondary | ICD-10-CM | POA: Diagnosis not present

## 2023-07-09 DIAGNOSIS — C8449 Peripheral T-cell lymphoma, not classified, extranodal and solid organ sites: Secondary | ICD-10-CM

## 2023-07-09 MED ORDER — PEGFILGRASTIM INJECTION 6 MG/0.6ML ~~LOC~~
6.0000 mg | PREFILLED_SYRINGE | Freq: Once | SUBCUTANEOUS | Status: AC
  Administered 2023-07-09: 6 mg via SUBCUTANEOUS
  Filled 2023-07-09: qty 0.6

## 2023-07-12 ENCOUNTER — Other Ambulatory Visit (HOSPITAL_COMMUNITY): Payer: Self-pay

## 2023-07-13 ENCOUNTER — Other Ambulatory Visit (HOSPITAL_COMMUNITY): Payer: Self-pay

## 2023-07-14 ENCOUNTER — Other Ambulatory Visit: Payer: Self-pay

## 2023-07-15 ENCOUNTER — Encounter: Payer: Self-pay | Admitting: Hematology and Oncology

## 2023-07-20 ENCOUNTER — Other Ambulatory Visit (HOSPITAL_COMMUNITY): Payer: Self-pay

## 2023-07-20 ENCOUNTER — Other Ambulatory Visit: Payer: Self-pay | Admitting: Hematology and Oncology

## 2023-07-20 MED ORDER — PROCHLORPERAZINE MALEATE 10 MG PO TABS
10.0000 mg | ORAL_TABLET | Freq: Four times a day (QID) | ORAL | 0 refills | Status: DC | PRN
  Filled 2023-07-20: qty 30, 8d supply, fill #0

## 2023-07-21 ENCOUNTER — Other Ambulatory Visit (HOSPITAL_COMMUNITY): Payer: Self-pay

## 2023-07-21 ENCOUNTER — Other Ambulatory Visit: Payer: Self-pay

## 2023-07-24 ENCOUNTER — Other Ambulatory Visit: Payer: Self-pay | Admitting: Hematology and Oncology

## 2023-07-24 ENCOUNTER — Telehealth: Payer: Self-pay | Admitting: *Deleted

## 2023-07-24 DIAGNOSIS — C8449 Peripheral T-cell lymphoma, not classified, extranodal and solid organ sites: Secondary | ICD-10-CM

## 2023-07-24 NOTE — Telephone Encounter (Signed)
 Received call from pt's wife. She states that her husband saw the oncologist @ [facility] yesterday (Elijah [name]) and apparently has some recommendations for treatment change. Advised that Elijah [name] has called Elijah [name] this morning but he was with a new patient. He has her cell # to call when he has a few free available minutes. To call, Elijah [name] voiced understanding. She also states he is having a CT scan done tomorrow to check on his abdominal drain and it is possible that it may be removed tomorrow. Elijah [name] is scheduled here for his next treatment on Monday, 07/28/23  Elijah [name] made aware of the above call.

## 2023-07-24 NOTE — Telephone Encounter (Signed)
 TCT pt's wife.   Spoke with her. Advised that Dr. Rosaline Coma has spoken with Dr. Meredeth Stallion @ [facility] and they have agreed to a change in Elijah [name]'s treatment plan. Dr. Rosaline Coma will be adding a drug called Etoposide. Advised that it will add on 2 extra days in addition to his main day of CHOP. Advised that the order is in but it needs to get authorized  by his insurance company and then we can get it all scheduled.  Advised that we will keep his Monday appts as they are and hope we get get approval by then. Elijah [name] voiced understanding and will let her husband know of the change.  Will advise scheduling in the morning as it is after 5 pm now

## 2023-07-24 NOTE — Progress Notes (Signed)
 DISCONTINUE ON PATHWAY REGIMEN - Lymphoma and CLL     A cycle is every 21 days:     Prednisone       Cyclophosphamide       Doxorubicin       Vincristine    **Always confirm dose/schedule in your pharmacy ordering system**  PRIOR TREATMENT: [id number]: CHOP q21 Days x 6 Cycles  START ON PATHWAY REGIMEN - Lymphoma and CLL     A cycle is every 21 days:     Prednisone       Cyclophosphamide       Doxorubicin       Vincristine       Etoposide   **Always confirm dose/schedule in your pharmacy ordering system**  Patient Characteristics: T-Cell Lymphoma (Systemic), First Line, AITL (Angioimmunoblastic T-Cell Lymphoma) or ALCL (Anaplastic Large Cell Lymphoma) or Peripheral T-Cell, NOS, CD30 Negative Disease Type: Not Applicable Disease Type: T-Cell Lymphoma (Systemic) Disease Type: Not Applicable Line of Therapy: First Line T-Cell Lymphoma Subtype: Peripheral T-Cell Lymphoma, NOS CD30 Status: CD30 Negative Intent of Therapy: Non-Curative / Palliative Intent, Discussed with Patient

## 2023-07-25 ENCOUNTER — Ambulatory Visit (HOSPITAL_COMMUNITY)
Admission: RE | Admit: 2023-07-25 | Discharge: 2023-07-25 | Disposition: A | Discharge status: Home / Self Care | Source: Ambulatory Visit | Attending: Student | Admitting: Student

## 2023-07-25 ENCOUNTER — Other Ambulatory Visit: Payer: Self-pay | Admitting: Student

## 2023-07-25 ENCOUNTER — Other Ambulatory Visit (HOSPITAL_COMMUNITY): Payer: Self-pay

## 2023-07-25 ENCOUNTER — Telehealth: Payer: Self-pay | Admitting: *Deleted

## 2023-07-25 DIAGNOSIS — Z9049 Acquired absence of other specified parts of digestive tract: Secondary | ICD-10-CM | POA: Insufficient documentation

## 2023-07-25 DIAGNOSIS — K651 Peritoneal abscess: Secondary | ICD-10-CM

## 2023-07-25 DIAGNOSIS — N200 Calculus of kidney: Secondary | ICD-10-CM | POA: Diagnosis not present

## 2023-07-25 DIAGNOSIS — C8590 Non-Hodgkin lymphoma, unspecified, unspecified site: Secondary | ICD-10-CM | POA: Diagnosis present

## 2023-07-25 HISTORY — PX: IR CV LINE INJECTION: IMG2294

## 2023-07-25 MED ORDER — ALFUZOSIN HCL ER 10 MG PO TB24
10.0000 mg | ORAL_TABLET | Freq: Every day | ORAL | 0 refills | Status: DC
  Filled 2023-07-25: qty 90, 90d supply, fill #0

## 2023-07-25 MED ORDER — IOHEXOL 300 MG/ML  SOLN
50.0000 mL | Freq: Once | INTRAMUSCULAR | Status: AC | PRN
  Administered 2023-07-25: 5 mL

## 2023-07-25 MED ORDER — IOHEXOL 350 MG/ML SOLN
75.0000 mL | Freq: Once | INTRAVENOUS | Status: AC | PRN
  Administered 2023-07-25: 75 mL via INTRAVENOUS

## 2023-07-25 NOTE — Telephone Encounter (Signed)
 Received call from pt's wife, Mariah Shines. She states they are home from Radiology appt and are reporting that his drain tube has been pulled per IR. They are very pleased with this.  Advised that he is all set up for new treatment plan starting on 07/28/23 and it appears that the addition of Etoposide has been approved.  Advised of all his appts next week.  Mariah Shines voices understanding.

## 2023-07-25 NOTE — Progress Notes (Addendum)
 Referring Physician(s): Hillary Lowing Dr Alonso Arthur  Chief Complaint: The patient is seen in follow up today s/p -- 06/05/23: Successful CT guided placement of a 12 Fr drainage catheter into the LEFT lower quadrant abscess with aspiration of 30 mL of purulent fluid.  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 Davonna Estes - known positive tumor margins with evidence of tumor necrosis of the mesentery with possible superinfection and possible communication with bowel  Post op LLQ fluid collection- IR drain placed 06/05/23  Here today for evaluation of drain Flushes daily ~5 cc OP minimal to none for days now Denies N/V Denies fever/chills No pain Has had 3 T cell chemotherapy treatments thus far  KLEBSIELLA PNEUMONIAE  Still taking Augmentin  per [facility] To follow with Dr Davonna Estes soon  Past Medical History:  Diagnosis Date   Acid reflux    Allergic rhinitis    Asthma    Bloating    BPH (benign prostatic hyperplasia)    Depression    ED (erectile dysfunction)    Epigastric pain    Exercise-induced asthma    GERD (gastroesophageal reflux disease)    History of kidney stones    Hypercholesteremia    Migraine 2009   Polymyalgia rheumatica (HCC)    Prediabetes    started on metformin  during january hospitalization   Prostate disease    Sleep apnea     Past Surgical History:  Procedure Laterality Date   ANKLE SURGERY Right    BOWEL RESECTION N/A 04/10/2023   Procedure: SMALL BOWEL RESECTION WITH MASS;  Surgeon: Cannon Champion, MD;  Location: [facility] OR;  Service: General;  Laterality: N/A;   CHEST TUBE INSERTION Left    COLONOSCOPY     CYSTOSCOPY WITH RETROGRADE PYELOGRAM, URETEROSCOPY AND STENT PLACEMENT Left 06/29/2020   Procedure: CYSTOSCOPY WITH RETROGRADE PYELOGRAM, URETEROSCOPY , BASKET EXTRACTION, AND STENT PLACEMENT;   Surgeon: Osborn Blaze, MD;  Location: [facility];  Service: Urology;  Laterality: Left;  1 HR   IR IMAGING GUIDED PORT INSERTION  05/13/2023   KNEE SURGERY Right    LAPAROTOMY N/A 04/10/2023   Procedure: EXPLORATORY LAPAROTOMY;  Surgeon: Cannon Champion, MD;  Location: [facility] OR;  Service: General;  Laterality: N/A;   SKIN CANCER EXCISION     unsure   TONSILLECTOMY     VASECTOMY     WISDOM TOOTH EXTRACTION      Allergies: Singulair [montelukast], Amlodipine , and Crestor [rosuvastatin]  Medications: Prior to Admission medications   Medication Sig Start Date End Date Taking? Authorizing Provider  Accu-Chek Softclix Lancets lancets Use daily before breakfast. 04/16/23   Verlyn Goad, MD  albuterol  (VENTOLIN  HFA) 108 [phone] Base) MCG/ACT inhaler Inhale 2 puffs into the lungs as needed (max of 12 puffs per day) 05/04/23     alfuzosin  (UROXATRAL ) 10 MG 24 hr tablet Take 1 tablet (10 mg total) by mouth daily. 01/03/23     allopurinol  (ZYLOPRIM ) 300 MG tablet Take 1 tablet (300 mg total) by mouth daily. 05/19/23   [name], John T IV, MD  amoxicillin -clavulanate (AUGMENTIN ) 875-125 MG tablet Take 1 tablet (875 mg total) by mouth every 12 (twelve) hours for 120 days 06/13/23     Blood Glucose Monitoring Suppl (ACCU-CHEK GUIDE) w/Device KIT Use daily before breakfast. 04/16/23   Verlyn Goad, MD  CINNAMON PO Take 1 capsule by  mouth daily.    [provider]  finasteride  (PROSCAR ) 5 MG tablet Take 1 tablet (5 mg total) by mouth daily. 02/11/23     glucose blood (ACCU-CHEK GUIDE TEST) test strip Use daily before breakfast. 04/16/23   Verlyn Goad, MD  Glucose Blood (BLOOD GLUCOSE TEST STRIPS) STRP Use to check blood sugar before breakfast as directed 04/16/23   Verlyn Goad, MD  lidocaine -prilocaine  (EMLA ) cream Apply 1 Application topically as needed. 05/19/23   Ander Bame, MD  loratadine  (CLARITIN ) 10 MG tablet Take 10 mg by mouth daily.    [provider]  methocarbamol   (ROBAXIN ) 500 MG tablet Take 2 tablets (1,000 mg total) by mouth every 8 (eight) hours as needed for muscle spasms. 06/16/23   Ander Bame, MD  minoxidil  (LONITEN ) 2.5 MG tablet Take 1 tablet (2.5 mg total) by mouth daily. 12/03/22     ondansetron  (ZOFRAN ) 8 MG tablet Take 1 tablet (8 mg total) by mouth every 8 (eight) hours as needed. 06/17/23   Ander Bame, MD  oxyCODONE  (OXY IR/ROXICODONE ) 5 MG immediate release tablet Take 1-2 tablets (5-10 mg total) by mouth every 6 (six) hours as needed for severe pain (pain score 7-10). 04/23/23   Ander Bame, MD  pantoprazole  (PROTONIX ) 40 MG tablet Take 1 tablet (40 mg total) by mouth daily 30 minutes to 1 hour before morning meal. 05/01/23     predniSONE  (DELTASONE ) 1 MG tablet Take 3 tablets by mouth daily, along with a 5mg  tablet for a total of 8mg  daily for 1 month. Taper by 1mg  monthly. 06/16/23   Nicholas Bari, MD  predniSONE  (DELTASONE ) 20 MG tablet Take 3 tablets (60 mg total) by mouth daily with breakfast. Take for 5 days starting day one of chemotherapy 06/16/23   [name], John T IV, MD  Probiotic TBEC Take 1 capsule by mouth daily.    [provider]  prochlorperazine  (COMPAZINE ) 10 MG tablet Take 1 tablet (10 mg total) by mouth every 6 (six) hours as needed for nausea or vomiting. 07/20/23   Ander Bame, MD     Family History  Problem Relation Age of Onset   Healthy Son    Healthy Daughter     Social History   Socioeconomic History   Marital status: Married    Spouse name: Not on file   Number of children: Not on file   Years of education: Not on file   Highest education level: Not on file  Occupational History   Not on file  Tobacco Use   Smoking status: Never    Passive exposure: Never   Smokeless tobacco: Never  Vaping Use   Vaping status: Never Used  Substance and Sexual Activity   Alcohol use: Yes    Comment: social   Drug use: Never   Sexual activity: Not on file  Other Topics Concern   Not  on file  Social History Narrative   Not on file   Social Drivers of Health   Financial Resource Strain: Not on file  Food Insecurity: No Food Insecurity (06/03/2023)   Hunger Vital Sign    Worried About Running Out of Food in the Last Year: Never true    Ran Out of Food in the Last Year: Never true  Transportation Needs: No Transportation Needs (06/03/2023)   PRAPARE - Administrator, Civil Service (Medical): No    Lack of Transportation (Non-Medical): No  Physical  Activity: Not on file  Stress: Not on file  Social Connections: Socially Integrated (06/04/2023)   Social Connection and Isolation Panel [NHANES]    Frequency of Communication with Friends and Family: More than three times a week    Frequency of Social Gatherings with Friends and Family: Three times a week    Attends Religious Services: More than 4 times per year    Active Member of Clubs or Organizations: No    Attends Banker Meetings: 1 to 4 times per year    Marital Status: Married     Vital Signs: There were no vitals taken for this visit.  Physical Exam Skin:    General: Skin is warm and dry.     Comments: Skin site is clean and dry NT No bleeding No sign of infection OP into bag is serous color-- maybe 3 cc in bag -- all since last night  Drain removed at bedside per [facility] order Tolerated well Dressing placed        Imaging: No results found.  Labs:  CBC: Recent Labs    06/06/23 0458 06/07/23 0534 06/17/23 0838 07/07/23 0810  WBC 19.0* 22.2* 13.4* 6.5  HGB 10.0* 10.0* 11.6* 11.6*  HCT 32.3* 31.8* 36.1* 36.8*  PLT 190 202 381 290    COAGS: Recent Labs    06/05/23 1200  INR 1.1    BMP: Recent Labs    06/06/23 0458 06/07/23 0534 06/17/23 0838 07/07/23 0810  NA 141 136 140 143  K 3.4* 3.3* 3.6 3.5  CL 106 103 107 111  CO2 25 25 25 24   GLUCOSE 128* 142* 182* 175*  BUN 14 12 27* 25*  CALCIUM 8.1* 8.5* 9.5 9.4  CREATININE 0.94 0.85 1.01 1.17  GFRNONAA  >60 >60 >60 >60    LIVER FUNCTION TESTS: Recent Labs    05/26/23 1346 06/03/23 1050 06/17/23 0838 07/07/23 0810  BILITOT 0.3 0.5 0.3 0.2  AST 14* 13* 13* 13*  ALT 16 22 34 26  ALKPHOS 57 66 65 65  PROT 7.1 6.8 6.8 6.8  ALBUMIN  3.8 3.7 3.8 3.9    Assessment:  LLQ post op abscess Drain placed in IR 06/05/23 Doing well; OP minimal to none Flushes daily CT today revealing abscess resolution per Dr Julietta Ogren Cont to take and finish Augmentin --- to see Dr Davonna Estes [facility] soon Drain removed per Dr Julietta Ogren order Pt has good understanding of plan  Signed: Ellen Guppy, PA-C 07/25/2023, 3:02 PM   Please refer to Dr. Julietta Ogren attestation of this note for management and plan.

## 2023-07-26 ENCOUNTER — Other Ambulatory Visit (HOSPITAL_COMMUNITY): Payer: Self-pay

## 2023-07-28 ENCOUNTER — Inpatient Hospital Stay

## 2023-07-28 ENCOUNTER — Inpatient Hospital Stay: Admitting: Nutrition

## 2023-07-28 ENCOUNTER — Inpatient Hospital Stay (HOSPITAL_BASED_OUTPATIENT_CLINIC_OR_DEPARTMENT_OTHER): Admitting: Hematology and Oncology

## 2023-07-28 VITALS — BP 156/91 | HR 88 | Temp 98.3°F | Resp 15 | Wt 233.2 lb

## 2023-07-28 DIAGNOSIS — C8449 Peripheral T-cell lymphoma, not classified, extranodal and solid organ sites: Secondary | ICD-10-CM | POA: Diagnosis not present

## 2023-07-28 DIAGNOSIS — Z95828 Presence of other vascular implants and grafts: Secondary | ICD-10-CM | POA: Diagnosis not present

## 2023-07-28 DIAGNOSIS — Z5111 Encounter for antineoplastic chemotherapy: Secondary | ICD-10-CM | POA: Diagnosis not present

## 2023-07-28 LAB — CMP (CANCER CENTER ONLY)
ALT: 27 U/L (ref 0–44)
AST: 14 U/L — ABNORMAL LOW (ref 15–41)
Albumin: 4 g/dL (ref 3.5–5.0)
Alkaline Phosphatase: 64 U/L (ref 38–126)
Anion gap: 7 (ref 5–15)
BUN: 22 mg/dL (ref 8–23)
CO2: 25 mmol/L (ref 22–32)
Calcium: 9.3 mg/dL (ref 8.9–10.3)
Chloride: 107 mmol/L (ref 98–111)
Creatinine: 1.07 mg/dL (ref 0.61–1.24)
GFR, Estimated: >60 mL/min (ref >60)
Glucose, Bld: 163 mg/dL — ABNORMAL HIGH (ref 70–99)
Potassium: 3.8 mmol/L (ref 3.5–5.1)
Sodium: 139 mmol/L (ref 135–145)
Total Bilirubin: 0.3 mg/dL (ref 0.0–1.2)
Total Protein: 6.8 g/dL (ref 6.5–8.1)

## 2023-07-28 LAB — CBC WITH DIFFERENTIAL (CANCER CENTER ONLY)
Abs Immature Granulocytes: 0.15 10*3/uL — ABNORMAL HIGH (ref 0.00–0.07)
Basophils Absolute: 0.2 10*3/uL — ABNORMAL HIGH (ref 0.0–0.1)
Basophils Relative: 2 %
Eosinophils Absolute: 0.1 10*3/uL (ref 0.0–0.5)
Eosinophils Relative: 2 %
HCT: 37.9 % — ABNORMAL LOW (ref 39.0–52.0)
Hemoglobin: 12.1 g/dL — ABNORMAL LOW (ref 13.0–17.0)
Immature Granulocytes: 2 %
Lymphocytes Relative: 11 %
Lymphs Abs: 0.9 10*3/uL (ref 0.7–4.0)
MCH: 28.7 pg (ref 26.0–34.0)
MCHC: 31.9 g/dL (ref 30.0–36.0)
MCV: 89.8 fL (ref 80.0–100.0)
Monocytes Absolute: 1 10*3/uL (ref 0.1–1.0)
Monocytes Relative: 12 %
Neutro Abs: 5.9 10*3/uL (ref 1.7–7.7)
Neutrophils Relative %: 71 %
Platelet Count: 295 10*3/uL (ref 150–400)
RBC: 4.22 MIL/uL (ref 4.22–5.81)
RDW: 18.3 % — ABNORMAL HIGH (ref 11.5–15.5)
WBC Count: 8.3 10*3/uL (ref 4.0–10.5)
nRBC: 0 % (ref 0.0–0.2)

## 2023-07-28 MED ORDER — DEXAMETHASONE SODIUM PHOSPHATE 10 MG/ML IJ SOLN
10.0000 mg | Freq: Once | INTRAMUSCULAR | Status: AC
  Administered 2023-07-28: 10 mg via INTRAVENOUS
  Filled 2023-07-28: qty 1

## 2023-07-28 MED ORDER — SODIUM CHLORIDE 0.9% FLUSH
10.0000 mL | INTRAVENOUS | Status: DC | PRN
  Administered 2023-07-28: 10 mL

## 2023-07-28 MED ORDER — SODIUM CHLORIDE 0.9 % IV SOLN
100.0000 mg/m2 | Freq: Once | INTRAVENOUS | Status: AC
  Administered 2023-07-28: 234 mg via INTRAVENOUS
  Filled 2023-07-28: qty 11.7

## 2023-07-28 MED ORDER — DOXORUBICIN HCL CHEMO IV INJECTION 2 MG/ML
50.0000 mg/m2 | Freq: Once | INTRAVENOUS | Status: AC
  Administered 2023-07-28: 118 mg via INTRAVENOUS
  Filled 2023-07-28: qty 59

## 2023-07-28 MED ORDER — VINCRISTINE SULFATE CHEMO INJECTION 1 MG/ML
2.0000 mg | Freq: Once | INTRAVENOUS | Status: AC
  Administered 2023-07-28: 2 mg via INTRAVENOUS
  Filled 2023-07-28: qty 2

## 2023-07-28 MED ORDER — SODIUM CHLORIDE 0.9 % IV SOLN
750.0000 mg/m2 | Freq: Once | INTRAVENOUS | Status: AC
  Administered 2023-07-28: 1760 mg via INTRAVENOUS
  Filled 2023-07-28: qty 88

## 2023-07-28 MED ORDER — SODIUM CHLORIDE 0.9% FLUSH
10.0000 mL | Freq: Once | INTRAVENOUS | Status: AC
Start: 2023-07-28 — End: 2023-07-28
  Administered 2023-07-28: 10 mL

## 2023-07-28 MED ORDER — HEPARIN SOD (PORK) LOCK FLUSH 100 UNIT/ML IV SOLN
500.0000 [IU] | Freq: Once | INTRAVENOUS | Status: AC | PRN
  Administered 2023-07-28: 500 [IU]

## 2023-07-28 MED ORDER — SODIUM CHLORIDE 0.9 % IV SOLN: INTRAVENOUS | Status: DC

## 2023-07-28 NOTE — Progress Notes (Signed)
 Nutrition follow-up completed with patient and wife during infusion for non-Hodgkin's lymphoma.  Patient is followed by Dr. Rosaline Coma.  He is receiving cycle 1 of CHOEP.  Weight documented as 233 pounds 3.2 ounces April 28.   This is increased from 226 pounds 8 ounces March 18.   Patient reports he thinks he is retaining some fluid.  Noted labs: Glucose 163.  He has continued to make progress with healing.  His drain was pulled on Friday and this is the only area that needs to close at this time.  He continues to drink Juven once daily.  Reports taste alterations generally last from week 1 through the end of week 2.  On week 3, taste improves slightly.  He notes increased fatigue.  Denies nausea, vomiting, constipation, and diarrhea.  Noted considering an allogenic transplant.  Nutrition diagnosis: Unintended weight loss improved.  Intervention: Continue small frequent meals and snacks throughout the day including adequate protein foods. Consume adequate calories for maintenance of lean body mass. Juven once daily to support wound healing.  Provided samples.  Monitoring, evaluation, goals: Patient will tolerate adequate calories and protein to minimize loss of lean body mass.  Next visit: To be scheduled as needed.  **Disclaimer: This note was dictated with voice recognition software. Similar sounding words can inadvertently be transcribed and this note may contain transcription errors which may not have been corrected upon publication of note.**

## 2023-07-28 NOTE — Patient Instructions (Signed)
 [facility] - A DEPT OF [facility]  Discharge Instructions: Thank you for choosing [facility] to provide your oncology and hematology care.   If you have a lab appointment with the Cancer Center, please go directly to the Cancer Center and check in at the registration area.   Wear comfortable clothing and clothing appropriate for easy access to any Portacath or PICC line.   We strive to give you quality time with your provider. You may need to reschedule your appointment if you arrive late (15 or more minutes).  Arriving late affects you and other patients whose appointments are after yours.  Also, if you miss three or more appointments without notifying the office, you may be dismissed from the clinic at the provider's discretion.      For prescription refill requests, have your pharmacy contact our office and allow 72 hours for refills to be completed.    Today you received the following chemotherapy and/or immunotherapy agents Adriamycin , Vincristine , Cytoxan , Etoposide      To help prevent nausea and vomiting after your treatment, we encourage you to take your nausea medication as directed.  BELOW ARE SYMPTOMS THAT SHOULD BE REPORTED IMMEDIATELY: *FEVER GREATER THAN 100.4 F (38 C) OR HIGHER *CHILLS OR SWEATING *NAUSEA AND VOMITING THAT IS NOT CONTROLLED WITH YOUR NAUSEA MEDICATION *UNUSUAL SHORTNESS OF BREATH *UNUSUAL BRUISING OR BLEEDING *URINARY PROBLEMS (pain or burning when urinating, or frequent urination) *BOWEL PROBLEMS (unusual diarrhea, constipation, pain near the anus) TENDERNESS IN MOUTH AND THROAT WITH OR WITHOUT PRESENCE OF ULCERS (sore throat, sores in mouth, or a toothache) UNUSUAL RASH, SWELLING OR PAIN  UNUSUAL VAGINAL DISCHARGE OR ITCHING   Items with * indicate a potential emergency and should be followed up as soon as possible or go to the Emergency Department if any problems should occur.  Please show the  CHEMOTHERAPY ALERT CARD or IMMUNOTHERAPY ALERT CARD at check-in to the Emergency Department and triage nurse.  Should you have questions after your visit or need to cancel or reschedule your appointment, please contact [facility] - A DEPT OF [facility]  Dept: [phone]  and follow the prompts.  Office hours are 8:00 a.m. to 4:30 p.m. Monday - Friday. Please note that voicemails left after 4:00 p.m. may not be returned until the following business day.  We are closed weekends and major holidays. You have access to a nurse at all times for urgent questions. Please call the main number to the clinic Dept: [phone] and follow the prompts.   For any non-urgent questions, you may also contact your provider using MyChart. We now offer e-Visits for anyone 25 and older to request care online for non-urgent symptoms. For details visit [email or web address].   Also download the MyChart app! Go to the app store, search "MyChart", open the app, select [place], and log in with your MyChart username and password.  Etoposide Injection What is this medication? ETOPOSIDE (e toe POE side) treats some types of cancer. It works by slowing down the growth of cancer cells. This medicine may be used for other purposes; ask your health care provider or pharmacist if you have questions. COMMON BRAND NAME(S): Etopophos, Toposar, VePesid What should I tell my care team before I take this medication? They need to know if you have any of these conditions: Infection Kidney disease Liver disease Low blood counts, such as low white cell, platelet, red  cell counts An unusual or allergic reaction to etoposide, other medications, foods, dyes, or preservatives If you or your partner are pregnant or trying to get pregnant Breastfeeding How should I use this medication? This medication is injected into a vein. It is given by your care team in a hospital or clinic setting. Talk to  your care team about the use of this medication in children. Special care may be needed. Overdosage: If you think you have taken too much of this medicine contact a poison control center or emergency room at once. NOTE: This medicine is only for you. Do not share this medicine with others. What if I miss a dose? Keep appointments for follow-up doses. It is important not to miss your dose. Call your care team if you are unable to keep an appointment. What may interact with this medication? Warfarin This list may not describe all possible interactions. Give your health care provider a list of all the medicines, herbs, non-prescription drugs, or dietary supplements you use. Also tell them if you smoke, drink alcohol, or use illegal drugs. Some items may interact with your medicine. What should I watch for while using this medication? Your condition will be monitored carefully while you are receiving this medication. This medication may make you feel generally unwell. This is not uncommon as chemotherapy can affect healthy cells as well as cancer cells. Report any side effects. Continue your course of treatment even though you feel ill unless your care team tells you to stop. This medication can cause serious side effects. To reduce the risk, your care team may give you other medications to take before receiving this one. Be sure to follow the directions from your care team. This medication may increase your risk of getting an infection. Call your care team for advice if you get a fever, chills, sore throat, or other symptoms of a cold or flu. Do not treat yourself. Try to avoid being around people who are sick. This medication may increase your risk to bruise or bleed. Call your care team if you notice any unusual bleeding. Talk to your care team about your risk of cancer. You may be more at risk for certain types of cancers if you take this medication. Talk to your care team if you may be pregnant.  Serious birth defects can occur if you take this medication during pregnancy and for 6 months after the last dose. You will need a negative pregnancy test before starting this medication. Contraception is recommended while taking this medication and for 6 months after the last dose. Your care team can help you find the option that works for you. If your partner can get pregnant, use a condom during sex while taking this medication and for 4 months after the last dose. Do not breastfeed while taking this medication. This medication may cause infertility. Talk to your care team if you are concerned about your fertility. What side effects may I notice from receiving this medication? Side effects that you should report to your care team as soon as possible: Allergic reactions--skin rash, itching, hives, swelling of the face, lips, tongue, or throat Infection--fever, chills, cough, sore throat, wounds that don't heal, pain or trouble when passing urine, general feeling of discomfort or being unwell Low red blood cell level--unusual weakness or fatigue, dizziness, headache, trouble breathing Unusual bruising or bleeding Side effects that usually do not require medical attention (report to your care team if they continue or are bothersome): Diarrhea Fatigue Hair  loss Loss of appetite Nausea Vomiting This list may not describe all possible side effects. Call your doctor for medical advice about side effects. You may report side effects to FDA at 1-800-FDA-1088. Where should I keep my medication? This medication is given in a hospital or clinic. It will not be stored at home. NOTE: This sheet is a summary. It may not cover all possible information. If you have questions about this medicine, talk to your doctor, pharmacist, or health care provider.  2024 Elsevier/Gold Standard (2021-08-09 00:00:00)

## 2023-07-28 NOTE — Progress Notes (Signed)
 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  CT abdomen/pelvis showed shows a mass in the left abdomen associated with the small bowel, along with pneumoperitoneum. Underwent exploratory laparotomy and small bowel resection. 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  04/23/2023: Established care with [facility] Hematology/Oncology. 05/26/2023: Cycle 1 Day of CHOP chemotherapy 06/17/2023: Cycle 2 Day 1 of CHOP chemotherapy 07/07/2023: Cycle 3 Day 1 of CHOP chemotherapy 07/28/2023: Cycle 1 Day of CHOEP chemotherapy(added etop per [facility] request)  Interval History:  Elijah [name] 68 y.o. male with medical history significant for T cell lymphoma who presents for a follow up visit. The patient's last visit was on 07/07/2023. In the interim since the last visit his drain has been re-inserted due to concern for development of an abscess.   On exam today Elijah [name] reports he has been well overall in the interim since our last visit.  He reports that he was evaluated at [facility] and up with transplant was as well.  They are considering allogenic transplant.  He reports that the drain has been removed by the surgical service as it was producing bright red blood and "poking him".  Notes he will continue the antibiotics.  He has been having some trouble with fatigue but is keeping up with his hydration and appetite.  He has had no nausea, Oni, diarrhea, or constipation.  He reports that he is having neuropathy is about a 1 out of 10.  He reports overall he is  feeling much better than he would expect.  Overall he feels healthy.  His energy is about a 6 out of 10.  He denies any fevers, chills, sweats.  A full 10 point ROS is otherwise negative.  The bulk of our recommendations focused on incorporating the recommendations of [facility].  We will alter his chemotherapy to CHOEP further recommendations based on his PET CT scan.  His PET CT scan did show excellent response to therapy, however there were areas of inflammation around the site of his prior infection that were unclear if it represented lymphoma or inflammation.  The patient voiced understanding of our findings and plan.  MEDICAL HISTORY:  Past Medical History:  Diagnosis Date   Acid reflux    Allergic rhinitis    Asthma    Bloating    BPH (benign prostatic hyperplasia)    Depression    ED (erectile dysfunction)    Epigastric pain    Exercise-induced asthma    GERD (gastroesophageal reflux disease)    History of kidney stones    Hypercholesteremia    Migraine 2009   Polymyalgia rheumatica (HCC)    Prediabetes    started on metformin  during january hospitalization   Prostate disease    Sleep apnea     SURGICAL HISTORY: Past Surgical History:  Procedure Laterality Date   ANKLE SURGERY Right    BOWEL RESECTION N/A 04/10/2023   Procedure: SMALL BOWEL RESECTION WITH MASS;  Surgeon: Cannon Champion, MD;  Location: [facility] OR;  Service: General;  Laterality: N/A;   CHEST TUBE INSERTION Left    COLONOSCOPY     CYSTOSCOPY WITH RETROGRADE PYELOGRAM, URETEROSCOPY AND STENT PLACEMENT Left  06/29/2020   Procedure: CYSTOSCOPY WITH RETROGRADE PYELOGRAM, URETEROSCOPY , BASKET EXTRACTION, AND STENT PLACEMENT;  Surgeon: Osborn Blaze, MD;  Location: [facility];  Service: Urology;  Laterality: Left;  1 HR   IR CV LINE INJECTION  07/25/2023   IR IMAGING GUIDED PORT INSERTION  05/13/2023   KNEE SURGERY Right    LAPAROTOMY N/A 04/10/2023   Procedure: EXPLORATORY LAPAROTOMY;  Surgeon: Cannon Champion, MD;   Location: [facility] OR;  Service: General;  Laterality: N/A;   SKIN CANCER EXCISION     unsure   TONSILLECTOMY     VASECTOMY     WISDOM TOOTH EXTRACTION      SOCIAL HISTORY: Social History   Socioeconomic History   Marital status: Married    Spouse name: Not on file   Number of children: Not on file   Years of education: Not on file   Highest education level: Not on file  Occupational History   Not on file  Tobacco Use   Smoking status: Never    Passive exposure: Never   Smokeless tobacco: Never  Vaping Use   Vaping status: Never Used  Substance and Sexual Activity   Alcohol use: Yes    Comment: social   Drug use: Never   Sexual activity: Not on file  Other Topics Concern   Not on file  Social History Narrative   Not on file   Social Drivers of Health   Financial Resource Strain: Not on file  Food Insecurity: No Food Insecurity (06/03/2023)   Hunger Vital Sign    Worried About Running Out of Food in the Last Year: Never true    Ran Out of Food in the Last Year: Never true  Transportation Needs: No Transportation Needs (06/03/2023)   PRAPARE - Administrator, Civil Service (Medical): No    Lack of Transportation (Non-Medical): No  Physical Activity: Not on file  Stress: Not on file  Social Connections: Socially Integrated (06/04/2023)   Social Connection and Isolation Panel [NHANES]    Frequency of Communication with Friends and Family: More than three times a week    Frequency of Social Gatherings with Friends and Family: Three times a week    Attends Religious Services: More than 4 times per year    Active Member of Clubs or Organizations: No    Attends Banker Meetings: 1 to 4 times per year    Marital Status: Married  Catering manager Violence: Not At Risk (06/03/2023)   Humiliation, Afraid, Rape, and Kick questionnaire    Fear of Current or Ex-Partner: No    Emotionally Abused: No    Physically Abused: No    Sexually Abused: No    FAMILY  HISTORY: Family History  Problem Relation Age of Onset   Healthy Son    Healthy Daughter     ALLERGIES:  is allergic to singulair [montelukast], amlodipine , and crestor [rosuvastatin].  MEDICATIONS:  Current Outpatient Medications  Medication Sig Dispense Refill   Accu-Chek Softclix Lancets lancets Use daily before breakfast. 100 each 0   albuterol  (VENTOLIN  HFA) 108 (90 Base) MCG/ACT inhaler Inhale 2 puffs into the lungs as needed (max of 12 puffs per day) 6.7 g 0   alfuzosin  (UROXATRAL ) 10 MG 24 hr tablet Take 1 tablet (10 mg total) by mouth daily. 90 tablet 0   allopurinol  (ZYLOPRIM ) 300 MG tablet Take 1 tablet (300 mg total) by mouth daily. 90 tablet 1   amoxicillin -clavulanate (AUGMENTIN ) 875-125 MG tablet Take 1  tablet (875 mg total) by mouth every 12 (twelve) hours for 120 days 60 tablet 3   Blood Glucose Monitoring Suppl (ACCU-CHEK GUIDE) w/Device KIT Use daily before breakfast. 1 kit 0   CINNAMON PO Take 1 capsule by mouth daily.     finasteride  (PROSCAR ) 5 MG tablet Take 1 tablet (5 mg total) by mouth daily. 90 tablet 3   glucose blood (ACCU-CHEK GUIDE TEST) test strip Use daily before breakfast. 50 each 0   Glucose Blood (BLOOD GLUCOSE TEST STRIPS) STRP Use to check blood sugar before breakfast as directed 100 strip 0   lidocaine -prilocaine  (EMLA ) cream Apply 1 Application topically as needed. 30 g 0   loratadine  (CLARITIN ) 10 MG tablet Take 10 mg by mouth daily.     methocarbamol  (ROBAXIN ) 500 MG tablet Take 2 tablets (1,000 mg total) by mouth every 8 (eight) hours as needed for muscle spasms. 60 tablet 1   minoxidil  (LONITEN ) 2.5 MG tablet Take 1 tablet (2.5 mg total) by mouth daily. 365 tablet 1   ondansetron  (ZOFRAN ) 8 MG tablet Take 1 tablet (8 mg total) by mouth every 8 (eight) hours as needed. 30 tablet 0   oxyCODONE  (OXY IR/ROXICODONE ) 5 MG immediate release tablet Take 1-2 tablets (5-10 mg total) by mouth every 6 (six) hours as needed for severe pain (pain score 7-10).  180 tablet 0   pantoprazole  (PROTONIX ) 40 MG tablet Take 1 tablet (40 mg total) by mouth daily 30 minutes to 1 hour before morning meal. 30 tablet 5   predniSONE  (DELTASONE ) 1 MG tablet Take 3 tablets by mouth daily, along with a 5mg  tablet for a total of 8mg  daily for 1 month. Taper by 1mg  monthly. 90 tablet 0   predniSONE  (DELTASONE ) 20 MG tablet Take 3 tablets (60 mg total) by mouth daily with breakfast. Take for 5 days starting day one of chemotherapy 15 tablet 5   Probiotic TBEC Take 1 capsule by mouth daily.     prochlorperazine  (COMPAZINE ) 10 MG tablet Take 1 tablet (10 mg total) by mouth every 6 (six) hours as needed for nausea or vomiting. 30 tablet 0   No current facility-administered medications for this visit.    REVIEW OF SYSTEMS:   All other systems were reviewed with the patient and are negative.  PHYSICAL EXAMINATION: ECOG PERFORMANCE STATUS: 1 - Symptomatic but completely ambulatory  There were no vitals filed for this visit.  There were no vitals filed for this visit.   GENERAL: Well-appearing middle-age Caucasian male, alert, no distress and comfortable SKIN: skin color, texture, turgor are normal, no rashes or significant lesions EYES: conjunctiva are pink and non-injected, sclera clear LUNGS: clear to auscultation and percussion with normal breathing effort HEART: regular rate & rhythm and no murmurs and no lower extremity edema ABDOMEN: soft, non-tender, non-distended, normal bowel sounds.  Drain in place in abdomen. Musculoskeletal: no cyanosis of digits and no clubbing  PSYCH: alert & oriented x 3, fluent speech NEURO: no focal motor/sensory deficits  LABORATORY DATA:  I have reviewed the data as listed    Latest Ref Rng & Units 07/07/2023    8:10 AM 06/17/2023    8:38 AM 06/07/2023    5:34 AM  CBC  WBC 4.0 - 10.5 K/uL 6.5  13.4  22.2   Hemoglobin 13.0 - 17.0 g/dL 16.1  09.6  04.5   Hematocrit 39.0 - 52.0 % 36.8  36.1  31.8   Platelets 150 - 400 K/uL 290   381  202  Latest Ref Rng & Units 07/07/2023    8:10 AM 06/17/2023    8:38 AM 06/07/2023    5:34 AM  CMP  Glucose 70 - 99 mg/dL 960  454  098   BUN 8 - 23 mg/dL 25  27  12    Creatinine 0.61 - 1.24 mg/dL 1.19  1.47  8.29   Sodium 135 - 145 mmol/L 143  140  136   Potassium 3.5 - 5.1 mmol/L 3.5  3.6  3.3   Chloride 98 - 111 mmol/L 111  107  103   CO2 22 - 32 mmol/L 24  25  25    Calcium 8.9 - 10.3 mg/dL 9.4  9.5  8.5   Total Protein 6.5 - 8.1 g/dL 6.8  6.8    Total Bilirubin 0.0 - 1.2 mg/dL 0.2  0.3    Alkaline Phos 38 - 126 U/L 65  65    AST 15 - 41 U/L 13  13    ALT 0 - 44 U/L 26  34      No results found for: "MPROTEIN" No results found for: "KPAFRELGTCHN", "LAMBDASER", "KAPLAMBRATIO"  RADIOGRAPHIC STUDIES: I have personally reviewed the radiological images as listed and agreed with the findings in the report. CT ABDOMEN PELVIS W CONTRAST Result Date: 07/25/2023 CLINICAL DATA:  68 year old with T-cell lymphoma and status post small bowel resection. Patient developed a postoperative abscess and a percutaneous drain was placed on 06/05/2023. Patient reports minimal output from the drain. EXAM: CT ABDOMEN AND PELVIS WITH CONTRAST TECHNIQUE: Multidetector CT imaging of the abdomen and pelvis was performed using the standard protocol following bolus administration of intravenous contrast. RADIATION DOSE REDUCTION: This exam was performed according to the departmental dose-optimization program which includes automated exposure control, adjustment of the mA and/or kV according to patient size and/or use of iterative reconstruction technique. CONTRAST:  75mL OMNIPAQUE  IOHEXOL  350 MG/ML SOLN COMPARISON:  CT abdomen pelvis 06/03/2023 and 12/07/2019 FINDINGS: Lower chest: Lung bases are clear.  No pleural effusions. Hepatobiliary: Normal appearance of the liver, gallbladder and portal venous system. No biliary dilatation. Pancreas: Unremarkable. No pancreatic ductal dilatation or surrounding  inflammatory changes. Spleen: Normal in size without focal abnormality. Adrenals/Urinary Tract: Normal adrenal glands. Again noted are bilateral renal cysts. Roughly 5 mm stone in the left kidney lower pole. No hydronephrosis. Again noted is an exophytic lesion in the anterior lower pole of the left kidney with macroscopic fat. This lesion measures 4.7 x 2.4 x 4.2 cm. This lesion was present on examination from 12/07/2019 and previously measured 4.0 x 1.8 x 3.6 cm. This is suggestive for an angiomyolipoma. No gross abnormality to the urinary bladder. Stomach/Bowel: Normal stomach. Normal appendix. Postoperative changes compatible with a small bowel resection and anastomosis. The large air-fluid collection in the left lower quadrant has been decompressed. There is a percutaneous drain in the left lower quadrant adjacent to loops of small bowel. The decompressed collection around the drain roughly measures 3.2 x 2.0 x 3.7 cm. Vascular/Lymphatic: Aortic atherosclerosis. No enlarged abdominal or pelvic lymph nodes. Reproductive: Stable appearance of the prostate without gross abnormality. Other: Negative for free fluid. Large abscess in left lower quadrant has been decompressed. No new fluid or abscess collections. Postoperative changes along the anterior abdominal wall. Musculoskeletal: No acute bone abnormality. Disc space narrowing at L5-S1. IMPRESSION: 1. The large fluid collection/abscess in left lower quadrant has been decompressed with the percutaneous drain. 2. No new fluid or abscess collections. 3. No acute abnormality in the abdomen  or pelvis. 4. **An incidental finding of potential clinical significance has been found. Exophytic lesion involving the left kidney lower pole. This lesion has macroscopic fat and suggestive for an angiomyolipoma. This renal lesion measures up to 4.7 cm and measured 4.0 cm in 2021. Due to the potential risk for spontaneous hemorrhage, recommend urology consultation. ** 5.  Nonobstructive left renal stone. Electronically Signed   By: Elene Griffes M.D.   On: 07/25/2023 17:01   IR INJECT INDWELLING DRAINAGE CATHETER Result Date: 07/25/2023 INDICATION: T-cell lymphoma with a large postoperative abscess in the left lower abdomen. Percutaneous drain was placed on 06/05/2023. Patient reports minimal output. EXAM: DRAIN INJECTION WITH FLUOROSCOPY COMPARISON:  CT abdomen and pelvis 07/25/2023 MEDICATIONS: None ANESTHESIA/SEDATION: None FLUOROSCOPY: Radiation Exposure Index (as provided by the fluoroscopic device): 19 mGy Kerma COMPLICATIONS: None immediate. TECHNIQUE: Patient was placed supine on the interventional table. Scout image was obtained. 5 mL Omnipaque  300 was injected through the drain with fluoroscopy. Contrast was aspirated at the end of the procedure. PROCEDURE: The catheter was cut and removed without complication. Bandage placed at the old drain site. FINDINGS: Scout image demonstrates contrast in the urinary bladder and the drain is in left lower quadrant of the abdomen. Contrast fills a small pocket around the drain but there is no evidence for a bowel fistula. Contrast was also refluxing along the drain tract. IMPRESSION: 1. Decompressed collection around the drain. No evidence for a bowel fistula. 2. The collection was also decompressed on today's CT and the patient has minimal output. As a result, the drain was removed. Electronically Signed   By: Elene Griffes M.D.   On: 07/25/2023 16:11     ASSESSMENT & PLAN Elijah [name] 68 y.o. male with medical history significant for T cell lymphoma who presents for a follow up visit.   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 Mesenteric  margin is positive.  --Discussed MEITL is a rare subtype of peripheral T-cell lymphoma, approximately less than 5% of cases.  --Reviewed that the treatment strategy will include CHOP based chemotherapy. Reviewed frequency and common side effects.  -- baseline PET imaging complete on 05/02/2023.  PLAN: --Labs today show white blood cell 8.3, hemoglobin 12.1, MCV 89.8, platelets 295. Creatinine and LFTs within normal limits. --proceed with Cycle 1 Day 1 of CHOEP.  --Plan for repeat PET CT scan after Cycle 3 of CHOEP.  Ideally he will only require 3 cycles of CHOEP (he already received 3 cycles of CHOP) --RTC for next cycle in 3 weeks with interval GCSF shot.    #Abdominal pain: --Improving after surgery.  --Drain removed. --Currently on oxycodone  5-10 mg q 4 hours, refill sent today.  #Supportive Care -- chemotherapy education complete -- port placed --TTE complete, adequate for treatment.  -- zofran  8mg  q8H PRN and compazine  10mg  PO q6H for nausea -- acyclovir 400mg  PO BID for VCZ prophylaxis -- allopurinol  300mg  PO daily for TLS prophylaxis -- EMLA  cream for port   No orders of the defined types were placed in this encounter.   All questions were answered. The patient knows to call the clinic with any problems, questions or concerns.  A total of more than 30 minutes were spent on this encounter with face-to-face time and non-face-to-face time, including preparing to see the patient, ordering tests and/or medications,  counseling the patient and coordination of care as outlined above.   Rogerio Clay, MD Department of Hematology/Oncology [facility] Phone: [phone] Pager: [phone] Email: [email or web address]  07/28/2023 7:26 AM

## 2023-07-29 ENCOUNTER — Inpatient Hospital Stay

## 2023-07-29 VITALS — BP 126/93 | HR 98 | Temp 98.4°F | Resp 18

## 2023-07-29 DIAGNOSIS — Z5111 Encounter for antineoplastic chemotherapy: Secondary | ICD-10-CM | POA: Diagnosis not present

## 2023-07-29 DIAGNOSIS — C8449 Peripheral T-cell lymphoma, not classified, extranodal and solid organ sites: Secondary | ICD-10-CM

## 2023-07-29 MED ORDER — SODIUM CHLORIDE 0.9% FLUSH: 10.0000 mL | INTRAVENOUS | Status: DC | PRN

## 2023-07-29 MED ORDER — HEPARIN SOD (PORK) LOCK FLUSH 100 UNIT/ML IV SOLN: 500.0000 [IU] | Freq: Once | INTRAVENOUS | Status: DC | PRN

## 2023-07-29 MED ORDER — SODIUM CHLORIDE 0.9 % IV SOLN
100.0000 mg/m2 | Freq: Once | INTRAVENOUS | Status: AC
  Administered 2023-07-29: 234 mg via INTRAVENOUS
  Filled 2023-07-29: qty 11.7

## 2023-07-29 MED ORDER — SODIUM CHLORIDE 0.9 % IV SOLN
INTRAVENOUS | Status: AC
Start: 2023-07-29 — End: 2023-07-29

## 2023-07-29 MED ORDER — PALONOSETRON HCL INJECTION 0.25 MG/5ML
0.2500 mg | Freq: Once | INTRAVENOUS | Status: AC
  Administered 2023-07-29: 0.25 mg via INTRAVENOUS
  Filled 2023-07-29: qty 5

## 2023-07-29 MED ORDER — SODIUM CHLORIDE 0.9 % IV SOLN
150.0000 mg | Freq: Once | INTRAVENOUS | Status: AC
  Administered 2023-07-29: 150 mg via INTRAVENOUS
  Filled 2023-07-29: qty 150

## 2023-07-29 MED ORDER — DEXAMETHASONE SODIUM PHOSPHATE 10 MG/ML IJ SOLN
10.0000 mg | Freq: Once | INTRAMUSCULAR | Status: AC
  Administered 2023-07-29: 10 mg via INTRAVENOUS
  Filled 2023-07-29: qty 1

## 2023-07-29 MED ORDER — FAMOTIDINE IN NACL 20-0.9 MG/50ML-% IV SOLN
20.0000 mg | Freq: Once | INTRAVENOUS | Status: AC
  Administered 2023-07-29: 20 mg via INTRAVENOUS
  Filled 2023-07-29: qty 50

## 2023-07-29 NOTE — Patient Instructions (Signed)
 [facility] - A DEPT OF [facility]  Discharge Instructions: Thank you for choosing [facility] to provide your oncology and hematology care.   If you have a lab appointment with the Cancer Center, please go directly to the Cancer Center and check in at the registration area.   Wear comfortable clothing and clothing appropriate for easy access to any Portacath or PICC line.   We strive to give you quality time with your provider. You may need to reschedule your appointment if you arrive late (15 or more minutes).  Arriving late affects you and other patients whose appointments are after yours.  Also, if you miss three or more appointments without notifying the office, you may be dismissed from the clinic at the provider's discretion.      For prescription refill requests, have your pharmacy contact our office and allow 72 hours for refills to be completed.    Today you received the following chemotherapy and/or immunotherapy agents: etoposide      To help prevent nausea and vomiting after your treatment, we encourage you to take your nausea medication as directed.  BELOW ARE SYMPTOMS THAT SHOULD BE REPORTED IMMEDIATELY: *FEVER GREATER THAN 100.4 F (38 C) OR HIGHER *CHILLS OR SWEATING *NAUSEA AND VOMITING THAT IS NOT CONTROLLED WITH YOUR NAUSEA MEDICATION *UNUSUAL SHORTNESS OF BREATH *UNUSUAL BRUISING OR BLEEDING *URINARY PROBLEMS (pain or burning when urinating, or frequent urination) *BOWEL PROBLEMS (unusual diarrhea, constipation, pain near the anus) TENDERNESS IN MOUTH AND THROAT WITH OR WITHOUT PRESENCE OF ULCERS (sore throat, sores in mouth, or a toothache) UNUSUAL RASH, SWELLING OR PAIN  UNUSUAL VAGINAL DISCHARGE OR ITCHING   Items with * indicate a potential emergency and should be followed up as soon as possible or go to the Emergency Department if any problems should occur.  Please show the CHEMOTHERAPY ALERT CARD or IMMUNOTHERAPY  ALERT CARD at check-in to the Emergency Department and triage nurse.  Should you have questions after your visit or need to cancel or reschedule your appointment, please contact [facility] - A DEPT OF [facility]  Dept: [phone]  and follow the prompts.  Office hours are 8:00 a.m. to 4:30 p.m. Monday - Friday. Please note that voicemails left after 4:00 p.m. may not be returned until the following business day.  We are closed weekends and major holidays. You have access to a nurse at all times for urgent questions. Please call the main number to the clinic Dept: [phone] and follow the prompts.   For any non-urgent questions, you may also contact your provider using MyChart. We now offer e-Visits for anyone 40 and older to request care online for non-urgent symptoms. For details visit [email or web address].   Also download the MyChart app! Go to the app store, search "MyChart", open the app, select [place], and log in with your MyChart username and password.

## 2023-07-30 ENCOUNTER — Inpatient Hospital Stay

## 2023-07-30 ENCOUNTER — Other Ambulatory Visit: Payer: Self-pay

## 2023-07-30 VITALS — BP 149/76 | HR 83 | Temp 98.3°F | Resp 16

## 2023-07-30 DIAGNOSIS — C8449 Peripheral T-cell lymphoma, not classified, extranodal and solid organ sites: Secondary | ICD-10-CM

## 2023-07-30 DIAGNOSIS — Z5111 Encounter for antineoplastic chemotherapy: Secondary | ICD-10-CM | POA: Diagnosis not present

## 2023-07-30 MED ORDER — DEXAMETHASONE SODIUM PHOSPHATE 10 MG/ML IJ SOLN
10.0000 mg | Freq: Once | INTRAMUSCULAR | Status: AC
  Administered 2023-07-30: 10 mg via INTRAVENOUS
  Filled 2023-07-30: qty 1

## 2023-07-30 MED ORDER — HEPARIN SOD (PORK) LOCK FLUSH 100 UNIT/ML IV SOLN
500.0000 [IU] | Freq: Once | INTRAVENOUS | Status: AC | PRN
  Administered 2023-07-30: 500 [IU]

## 2023-07-30 MED ORDER — SODIUM CHLORIDE 0.9 % IV SOLN
INTRAVENOUS | Status: DC
Start: 2023-07-30 — End: 2023-07-30

## 2023-07-30 MED ORDER — SODIUM CHLORIDE 0.9% FLUSH
10.0000 mL | INTRAVENOUS | Status: DC | PRN
  Administered 2023-07-30: 10 mL

## 2023-07-30 MED ORDER — SODIUM CHLORIDE 0.9 % IV SOLN
100.0000 mg/m2 | Freq: Once | INTRAVENOUS | Status: AC
  Administered 2023-07-30: 234 mg via INTRAVENOUS
  Filled 2023-07-30: qty 11.7

## 2023-07-30 NOTE — Patient Instructions (Signed)
 [facility] - A DEPT OF [facility]  Discharge Instructions: Thank you for choosing [facility] to provide your oncology and hematology care.   If you have a lab appointment with the Cancer Center, please go directly to the Cancer Center and check in at the registration area.   Wear comfortable clothing and clothing appropriate for easy access to any Portacath or PICC line.   We strive to give you quality time with your provider. You may need to reschedule your appointment if you arrive late (15 or more minutes).  Arriving late affects you and other patients whose appointments are after yours.  Also, if you miss three or more appointments without notifying the office, you may be dismissed from the clinic at the provider's discretion.      For prescription refill requests, have your pharmacy contact our office and allow 72 hours for refills to be completed.    Today you received the following chemotherapy and/or immunotherapy agents: etoposide      To help prevent nausea and vomiting after your treatment, we encourage you to take your nausea medication as directed.  BELOW ARE SYMPTOMS THAT SHOULD BE REPORTED IMMEDIATELY: *FEVER GREATER THAN 100.4 F (38 C) OR HIGHER *CHILLS OR SWEATING *NAUSEA AND VOMITING THAT IS NOT CONTROLLED WITH YOUR NAUSEA MEDICATION *UNUSUAL SHORTNESS OF BREATH *UNUSUAL BRUISING OR BLEEDING *URINARY PROBLEMS (pain or burning when urinating, or frequent urination) *BOWEL PROBLEMS (unusual diarrhea, constipation, pain near the anus) TENDERNESS IN MOUTH AND THROAT WITH OR WITHOUT PRESENCE OF ULCERS (sore throat, sores in mouth, or a toothache) UNUSUAL RASH, SWELLING OR PAIN  UNUSUAL VAGINAL DISCHARGE OR ITCHING   Items with * indicate a potential emergency and should be followed up as soon as possible or go to the Emergency Department if any problems should occur.  Please show the CHEMOTHERAPY ALERT CARD or IMMUNOTHERAPY  ALERT CARD at check-in to the Emergency Department and triage nurse.  Should you have questions after your visit or need to cancel or reschedule your appointment, please contact [facility] - A DEPT OF [facility]  Dept: [phone]  and follow the prompts.  Office hours are 8:00 a.m. to 4:30 p.m. Monday - Friday. Please note that voicemails left after 4:00 p.m. may not be returned until the following business day.  We are closed weekends and major holidays. You have access to a nurse at all times for urgent questions. Please call the main number to the clinic Dept: [phone] and follow the prompts.   For any non-urgent questions, you may also contact your provider using MyChart. We now offer e-Visits for anyone 40 and older to request care online for non-urgent symptoms. For details visit [email or web address].   Also download the MyChart app! Go to the app store, search "MyChart", open the app, select [place], and log in with your MyChart username and password.

## 2023-07-31 ENCOUNTER — Telehealth: Payer: Self-pay

## 2023-07-31 NOTE — Telephone Encounter (Signed)
-----   Message from Nurse Elijah Guadalajara sent at 07/28/2023  1:44 PM EDT ----- Regarding: Elijah [name] 1st Tx F/U call - Etoposide  [name] 1st Tx F/U call - adriamycin , vincristine , cytoxan , etoposide .  Etoposide  was the only new drug for this patient in this cycle.  Tolerated first cycle well.

## 2023-07-31 NOTE — Telephone Encounter (Signed)
LM for patient that this nurse was calling to see how they were doing after their treatment. Please call back to Dr.  [name]'s nurse at [phone] if they have any questions or concerns regarding the treatment.  ?

## 2023-08-01 ENCOUNTER — Inpatient Hospital Stay: Attending: Physician Assistant

## 2023-08-01 VITALS — BP 149/79 | HR 83 | Temp 98.4°F | Resp 18

## 2023-08-01 DIAGNOSIS — Z5111 Encounter for antineoplastic chemotherapy: Secondary | ICD-10-CM | POA: Insufficient documentation

## 2023-08-01 DIAGNOSIS — Z5189 Encounter for other specified aftercare: Secondary | ICD-10-CM | POA: Diagnosis not present

## 2023-08-01 DIAGNOSIS — C862 Enteropathy-type (intestinal) T-cell lymphoma not having achieved remission: Secondary | ICD-10-CM | POA: Insufficient documentation

## 2023-08-01 DIAGNOSIS — C8449 Peripheral T-cell lymphoma, not classified, extranodal and solid organ sites: Secondary | ICD-10-CM

## 2023-08-01 DIAGNOSIS — Z79899 Other long term (current) drug therapy: Secondary | ICD-10-CM | POA: Diagnosis not present

## 2023-08-01 MED ORDER — PEGFILGRASTIM-FPGK 6 MG/0.6ML ~~LOC~~ SOSY
6.0000 mg | PREFILLED_SYRINGE | Freq: Once | SUBCUTANEOUS | Status: AC
  Administered 2023-08-01: 6 mg via SUBCUTANEOUS
  Filled 2023-08-01: qty 0.6

## 2023-08-02 ENCOUNTER — Encounter: Payer: Self-pay | Admitting: Hematology and Oncology

## 2023-08-02 ENCOUNTER — Encounter: Payer: Self-pay | Admitting: Physician Assistant

## 2023-08-04 ENCOUNTER — Encounter: Payer: Self-pay | Admitting: Hematology and Oncology

## 2023-08-05 ENCOUNTER — Ambulatory Visit

## 2023-08-05 ENCOUNTER — Telehealth: Payer: Self-pay | Admitting: Physician Assistant

## 2023-08-05 ENCOUNTER — Other Ambulatory Visit (HOSPITAL_COMMUNITY): Payer: Self-pay

## 2023-08-05 ENCOUNTER — Inpatient Hospital Stay: Admitting: Physician Assistant

## 2023-08-05 ENCOUNTER — Other Ambulatory Visit: Payer: Self-pay | Admitting: Physician Assistant

## 2023-08-05 ENCOUNTER — Encounter: Payer: Self-pay | Admitting: Physician Assistant

## 2023-08-05 ENCOUNTER — Inpatient Hospital Stay

## 2023-08-05 VITALS — BP 157/85 | HR 102 | Temp 97.3°F | Resp 16 | Wt 237.0 lb

## 2023-08-05 DIAGNOSIS — C8449 Peripheral T-cell lymphoma, not classified, extranodal and solid organ sites: Secondary | ICD-10-CM

## 2023-08-05 DIAGNOSIS — Z5111 Encounter for antineoplastic chemotherapy: Secondary | ICD-10-CM | POA: Diagnosis not present

## 2023-08-05 DIAGNOSIS — Z95828 Presence of other vascular implants and grafts: Secondary | ICD-10-CM

## 2023-08-05 LAB — CBC WITH DIFFERENTIAL (CANCER CENTER ONLY)
Abs Immature Granulocytes: 0.08 10*3/uL — ABNORMAL HIGH (ref 0.00–0.07)
Basophils Absolute: 0.1 10*3/uL (ref 0.0–0.1)
Basophils Relative: 3 %
Eosinophils Absolute: 0.3 10*3/uL (ref 0.0–0.5)
Eosinophils Relative: 13 %
HCT: 33.3 % — ABNORMAL LOW (ref 39.0–52.0)
Hemoglobin: 11.1 g/dL — ABNORMAL LOW (ref 13.0–17.0)
Immature Granulocytes: 4 %
Lymphocytes Relative: 19 %
Lymphs Abs: 0.4 10*3/uL — ABNORMAL LOW (ref 0.7–4.0)
MCH: 29.7 pg (ref 26.0–34.0)
MCHC: 33.3 g/dL (ref 30.0–36.0)
MCV: 89 fL (ref 80.0–100.0)
Monocytes Absolute: 0.2 10*3/uL (ref 0.1–1.0)
Monocytes Relative: 8 %
Neutro Abs: 1.1 10*3/uL — ABNORMAL LOW (ref 1.7–7.7)
Neutrophils Relative %: 53 %
Platelet Count: 106 10*3/uL — ABNORMAL LOW (ref 150–400)
RBC: 3.74 MIL/uL — ABNORMAL LOW (ref 4.22–5.81)
RDW: 17.2 % — ABNORMAL HIGH (ref 11.5–15.5)
Smear Review: NORMAL
WBC Count: 2 10*3/uL — ABNORMAL LOW (ref 4.0–10.5)
nRBC: 0 % (ref 0.0–0.2)

## 2023-08-05 LAB — CMP (CANCER CENTER ONLY)
ALT: 19 U/L (ref 0–44)
AST: 9 U/L — ABNORMAL LOW (ref 15–41)
Albumin: 3.9 g/dL (ref 3.5–5.0)
Alkaline Phosphatase: 62 U/L (ref 38–126)
Anion gap: 7 (ref 5–15)
BUN: 18 mg/dL (ref 8–23)
CO2: 25 mmol/L (ref 22–32)
Calcium: 8.9 mg/dL (ref 8.9–10.3)
Chloride: 108 mmol/L (ref 98–111)
Creatinine: 0.98 mg/dL (ref 0.61–1.24)
GFR, Estimated: >60 mL/min (ref >60)
Glucose, Bld: 158 mg/dL — ABNORMAL HIGH (ref 70–99)
Potassium: 3.5 mmol/L (ref 3.5–5.1)
Sodium: 140 mmol/L (ref 135–145)
Total Bilirubin: 0.5 mg/dL (ref 0.0–1.2)
Total Protein: 6.5 g/dL (ref 6.5–8.1)

## 2023-08-05 LAB — MAGNESIUM: Magnesium: 1.9 mg/dL (ref 1.7–2.4)

## 2023-08-05 MED ORDER — ALBUTEROL SULFATE HFA 108 (90 BASE) MCG/ACT IN AERS
INHALATION_SPRAY | RESPIRATORY_TRACT | 0 refills | Status: DC
  Filled 2023-08-05: qty 6.7, 17d supply, fill #0

## 2023-08-05 MED ORDER — HEPARIN SOD (PORK) LOCK FLUSH 100 UNIT/ML IV SOLN
500.0000 [IU] | Freq: Once | INTRAVENOUS | Status: AC
  Administered 2023-08-05: 500 [IU]

## 2023-08-05 MED ORDER — SODIUM CHLORIDE 0.9% FLUSH
10.0000 mL | Freq: Once | INTRAVENOUS | Status: AC
  Administered 2023-08-05: 10 mL

## 2023-08-05 NOTE — Telephone Encounter (Signed)
 Scheduled appointments and the patient is aware .

## 2023-08-06 ENCOUNTER — Other Ambulatory Visit: Payer: Self-pay

## 2023-08-06 ENCOUNTER — Telehealth: Payer: Self-pay | Admitting: *Deleted

## 2023-08-06 NOTE — Telephone Encounter (Signed)
 TCT patient and spoke with him and his wife. Called to see how he is feeling with using nasonex and mucinex for his head/nose/ear congestion.  He states he is feeling quite a bit better. He was able to go for a walk this afternoon and he states he feels like his head is not so "heavy" and he is not so dizzy today.  Advised to continue with these medications as directed by Wyline Hearing, PA-C Advised to call with any changes in condition, worsening of symptoms.  He voiced understanding.

## 2023-08-07 ENCOUNTER — Encounter: Payer: Self-pay | Admitting: Hematology and Oncology

## 2023-08-07 NOTE — Progress Notes (Signed)
 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  CT abdomen/pelvis showed shows a mass in the left abdomen associated with the small bowel, along with pneumoperitoneum. Underwent exploratory laparotomy and small bowel resection. 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  04/23/2023: Established care with [facility] Hematology/Oncology. 05/26/2023: Cycle 1 Day of CHOP chemotherapy 06/17/2023: Cycle 2 Day 1 of CHOP chemotherapy 07/07/2023: Cycle 3 Day 1 of CHOP chemotherapy 07/28/2023: Cycle 1 Day of CHOEP chemotherapy(added etop per [facility] request)  Interval History:  Elijah [name] 68 y.o. male with medical history significant for T cell lymphoma who presents for a follow up visit. The patient's last visit was on 07/28/2023. In the interim since the last visit, he completed cycle 1 of CHOEP.   Over the last few days after his last treatment, Elijah [name] reports progressive fatigue which has impacted his ADLs. He reports his appetite is overall fairly. He developed severe nausea and vomiting on D1. After adding the antiemetics to his premeds on D2, his nausea/vomiting have nearly resolved. He denies any abdominal pain at this time. His bowel habits are stable. He reports worsening shortness of breath with exertion. He adds that he has nasal congestion, watery eyes and ears feel full. In addition, he is experiencing vertigo without any recent falls. He reports his neuropathy is much more noticeable after this last treatment. He does notice his grip is  affected but balance has been unchanged. He denies fevers, chills, sweats, chest pain or cough. He has no other complaints. MEDICAL HISTORY:  Past Medical History:  Diagnosis Date   Acid reflux    Allergic rhinitis    Asthma    Bloating    BPH (benign prostatic hyperplasia)    Depression    ED (erectile dysfunction)    Epigastric pain    Exercise-induced asthma    GERD (gastroesophageal reflux disease)    History of kidney stones    Hypercholesteremia    Migraine 2009   Polymyalgia rheumatica (HCC)    Prediabetes    started on metformin  during january hospitalization   Prostate disease    Sleep apnea     SURGICAL HISTORY: Past Surgical History:  Procedure Laterality Date   ANKLE SURGERY Right    BOWEL RESECTION N/A 04/10/2023   Procedure: SMALL BOWEL RESECTION WITH MASS;  Surgeon: Cannon Champion, MD;  Location: [facility] OR;  Service: General;  Laterality: N/A;   CHEST TUBE INSERTION Left    COLONOSCOPY     CYSTOSCOPY WITH RETROGRADE PYELOGRAM, URETEROSCOPY AND STENT PLACEMENT Left 06/29/2020   Procedure: CYSTOSCOPY WITH RETROGRADE PYELOGRAM, URETEROSCOPY , BASKET EXTRACTION, AND STENT PLACEMENT;  Surgeon: Osborn Blaze, MD;  Location: [facility] ORS;  Service: Urology;  Laterality: Left;  1 HR   IR CV LINE INJECTION  07/25/2023   IR IMAGING GUIDED PORT INSERTION  05/13/2023   KNEE SURGERY Right    LAPAROTOMY N/A 04/10/2023   Procedure: EXPLORATORY LAPAROTOMY;  Surgeon: Cannon Champion, MD;  Location: [facility] OR;  Service: General;  Laterality: N/A;   SKIN CANCER EXCISION     unsure   TONSILLECTOMY     VASECTOMY     WISDOM TOOTH EXTRACTION  SOCIAL HISTORY: Social History   Socioeconomic History   Marital status: Married    Spouse name: Not on file   Number of children: Not on file   Years of education: Not on file   Highest education level: Not on file  Occupational History   Not on file  Tobacco Use   Smoking status: Never    Passive exposure: Never   Smokeless  tobacco: Never  Vaping Use   Vaping status: Never Used  Substance and Sexual Activity   Alcohol use: Yes    Comment: social   Drug use: Never   Sexual activity: Not on file  Other Topics Concern   Not on file  Social History Narrative   Not on file   Social Drivers of Health   Financial Resource Strain: Not on file  Food Insecurity: No Food Insecurity (06/03/2023)   Hunger Vital Sign    Worried About Running Out of Food in the Last Year: Never true    Ran Out of Food in the Last Year: Never true  Transportation Needs: No Transportation Needs (06/03/2023)   PRAPARE - Administrator, Civil Service (Medical): No    Lack of Transportation (Non-Medical): No  Physical Activity: Not on file  Stress: Not on file  Social Connections: Socially Integrated (06/04/2023)   Social Connection and Isolation Panel [NHANES]    Frequency of Communication with Friends and Family: More than three times a week    Frequency of Social Gatherings with Friends and Family: Three times a week    Attends Religious Services: More than 4 times per year    Active Member of Clubs or Organizations: No    Attends Banker Meetings: 1 to 4 times per year    Marital Status: Married  Catering manager Violence: Not At Risk (06/03/2023)   Humiliation, Afraid, Rape, and Kick questionnaire    Fear of Current or Ex-Partner: No    Emotionally Abused: No    Physically Abused: No    Sexually Abused: No    FAMILY HISTORY: Family History  Problem Relation Age of Onset   Healthy Son    Healthy Daughter     ALLERGIES:  is allergic to singulair [montelukast], amlodipine , and crestor [rosuvastatin].  MEDICATIONS:  Current Outpatient Medications  Medication Sig Dispense Refill   Accu-Chek Softclix Lancets lancets Use daily before breakfast. 100 each 0   alfuzosin  (UROXATRAL ) 10 MG 24 hr tablet Take 1 tablet (10 mg total) by mouth daily. 90 tablet 0   allopurinol  (ZYLOPRIM ) 300 MG tablet Take 1  tablet (300 mg total) by mouth daily. 90 tablet 1   amoxicillin -clavulanate (AUGMENTIN ) 875-125 MG tablet Take 1 tablet (875 mg total) by mouth every 12 (twelve) hours for 120 days 60 tablet 3   Blood Glucose Monitoring Suppl (ACCU-CHEK GUIDE) w/Device KIT Use daily before breakfast. 1 kit 0   CINNAMON PO Take 1 capsule by mouth daily.     finasteride  (PROSCAR ) 5 MG tablet Take 1 tablet (5 mg total) by mouth daily. 90 tablet 3   glucose blood (ACCU-CHEK GUIDE TEST) test strip Use daily before breakfast. 50 each 0   Glucose Blood (BLOOD GLUCOSE TEST STRIPS) STRP Use to check blood sugar before breakfast as directed 100 strip 0   lidocaine -prilocaine  (EMLA ) cream Apply 1 Application topically as needed. 30 g 0   loratadine  (CLARITIN ) 10 MG tablet Take 10 mg by mouth daily.     methocarbamol  (ROBAXIN ) 500 MG tablet Take 2  tablets (1,000 mg total) by mouth every 8 (eight) hours as needed for muscle spasms. 60 tablet 1   minoxidil  (LONITEN ) 2.5 MG tablet Take 1 tablet (2.5 mg total) by mouth daily. 365 tablet 1   ondansetron  (ZOFRAN ) 8 MG tablet Take 1 tablet (8 mg total) by mouth every 8 (eight) hours as needed. 30 tablet 0   oxyCODONE  (OXY IR/ROXICODONE ) 5 MG immediate release tablet Take 1-2 tablets (5-10 mg total) by mouth every 6 (six) hours as needed for severe pain (pain score 7-10). 180 tablet 0   pantoprazole  (PROTONIX ) 40 MG tablet Take 1 tablet (40 mg total) by mouth daily 30 minutes to 1 hour before morning meal. 30 tablet 5   predniSONE  (DELTASONE ) 1 MG tablet Take 3 tablets by mouth daily, along with a 5mg  tablet for a total of 8mg  daily for 1 month. Taper by 1mg  monthly. 90 tablet 0   predniSONE  (DELTASONE ) 20 MG tablet Take 3 tablets (60 mg total) by mouth daily with breakfast. Take for 5 days starting day one of chemotherapy 15 tablet 5   Probiotic TBEC Take 1 capsule by mouth daily.     prochlorperazine  (COMPAZINE ) 10 MG tablet Take 1 tablet (10 mg total) by mouth every 6 (six) hours as  needed for nausea or vomiting. 30 tablet 0   albuterol  (VENTOLIN  HFA) 108 (90 Base) MCG/ACT inhaler Inhale 2 puffs into the lungs as needed (max of 12 puffs per day) 6.7 g 0   No current facility-administered medications for this visit.    REVIEW OF SYSTEMS:   All other systems were reviewed with the patient and are negative.  PHYSICAL EXAMINATION: ECOG PERFORMANCE STATUS: 1 - Symptomatic but completely ambulatory  Vitals:   08/05/23 1255  BP: (!) 157/85  Pulse: (!) 102  Resp: 16  Temp: (!) 97.3 F (36.3 C)  SpO2: 97%    Filed Weights   08/05/23 1255  Weight: 237 lb (107.5 kg)     GENERAL: Well-appearing middle-age Caucasian male, alert, no distress and comfortable SKIN: skin color, texture, turgor are normal, no rashes or significant lesions EYES: conjunctiva are pink and non-injected, sclera clear LUNGS: clear to auscultation and percussion with normal breathing effort HEART: regular rate & rhythm and no murmurs and no lower extremity edema Musculoskeletal: no cyanosis of digits and no clubbing  PSYCH: alert & oriented x 3, fluent speech NEURO: no focal motor/sensory deficits  LABORATORY DATA:  I have reviewed the data as listed    Latest Ref Rng & Units 08/05/2023   12:26 PM 07/28/2023    8:36 AM 07/07/2023    8:10 AM  CBC  WBC 4.0 - 10.5 K/uL 2.0  8.3  6.5   Hemoglobin 13.0 - 17.0 g/dL 04.5  40.9  81.1   Hematocrit 39.0 - 52.0 % 33.3  37.9  36.8   Platelets 150 - 400 K/uL 106  295  290        Latest Ref Rng & Units 08/05/2023   12:26 PM 07/28/2023    8:36 AM 07/07/2023    8:10 AM  CMP  Glucose 70 - 99 mg/dL 914  782  956   BUN 8 - 23 mg/dL 18  22  25    Creatinine 0.61 - 1.24 mg/dL 2.13  0.86  5.78   Sodium 135 - 145 mmol/L 140  139  143   Potassium 3.5 - 5.1 mmol/L 3.5  3.8  3.5   Chloride 98 - 111 mmol/L 108  107  111   CO2 22 - 32 mmol/L 25  25  24    Calcium 8.9 - 10.3 mg/dL 8.9  9.3  9.4   Total Protein 6.5 - 8.1 g/dL 6.5  6.8  6.8   Total Bilirubin 0.0  - 1.2 mg/dL 0.5  0.3  0.2   Alkaline Phos 38 - 126 U/L 62  64  65   AST 15 - 41 U/L 9  14  13    ALT 0 - 44 U/L 19  27  26      No results found for: "MPROTEIN" No results found for: "KPAFRELGTCHN", "LAMBDASER", "KAPLAMBRATIO"  RADIOGRAPHIC STUDIES: I have personally reviewed the radiological images as listed and agreed with the findings in the report. CT ABDOMEN PELVIS W CONTRAST Result Date: 07/25/2023 CLINICAL DATA:  68 year old with T-cell lymphoma and status post small bowel resection. Patient developed a postoperative abscess and a percutaneous drain was placed on 06/05/2023. Patient reports minimal output from the drain. EXAM: CT ABDOMEN AND PELVIS WITH CONTRAST TECHNIQUE: Multidetector CT imaging of the abdomen and pelvis was performed using the standard protocol following bolus administration of intravenous contrast. RADIATION DOSE REDUCTION: This exam was performed according to the departmental dose-optimization program which includes automated exposure control, adjustment of the mA and/or kV according to patient size and/or use of iterative reconstruction technique. CONTRAST:  75mL OMNIPAQUE  IOHEXOL  350 MG/ML SOLN COMPARISON:  CT abdomen pelvis 06/03/2023 and 12/07/2019 FINDINGS: Lower chest: Lung bases are clear.  No pleural effusions. Hepatobiliary: Normal appearance of the liver, gallbladder and portal venous system. No biliary dilatation. Pancreas: Unremarkable. No pancreatic ductal dilatation or surrounding inflammatory changes. Spleen: Normal in size without focal abnormality. Adrenals/Urinary Tract: Normal adrenal glands. Again noted are bilateral renal cysts. Roughly 5 mm stone in the left kidney lower pole. No hydronephrosis. Again noted is an exophytic lesion in the anterior lower pole of the left kidney with macroscopic fat. This lesion measures 4.7 x 2.4 x 4.2 cm. This lesion was present on examination from 12/07/2019 and previously measured 4.0 x 1.8 x 3.6 cm. This is suggestive for  an angiomyolipoma. No gross abnormality to the urinary bladder. Stomach/Bowel: Normal stomach. Normal appendix. Postoperative changes compatible with a small bowel resection and anastomosis. The large air-fluid collection in the left lower quadrant has been decompressed. There is a percutaneous drain in the left lower quadrant adjacent to loops of small bowel. The decompressed collection around the drain roughly measures 3.2 x 2.0 x 3.7 cm. Vascular/Lymphatic: Aortic atherosclerosis. No enlarged abdominal or pelvic lymph nodes. Reproductive: Stable appearance of the prostate without gross abnormality. Other: Negative for free fluid. Large abscess in left lower quadrant has been decompressed. No new fluid or abscess collections. Postoperative changes along the anterior abdominal wall. Musculoskeletal: No acute bone abnormality. Disc space narrowing at L5-S1. IMPRESSION: 1. The large fluid collection/abscess in left lower quadrant has been decompressed with the percutaneous drain. 2. No new fluid or abscess collections. 3. No acute abnormality in the abdomen or pelvis. 4. **An incidental finding of potential clinical significance has been found. Exophytic lesion involving the left kidney lower pole. This lesion has macroscopic fat and suggestive for an angiomyolipoma. This renal lesion measures up to 4.7 cm and measured 4.0 cm in 2021. Due to the potential risk for spontaneous hemorrhage, recommend urology consultation. ** 5. Nonobstructive left renal stone. Electronically Signed   By: Elene Griffes M.D.   On: 07/25/2023 17:01   IR INJECT INDWELLING DRAINAGE CATHETER Result Date: 07/25/2023 INDICATION: T-cell lymphoma  with a large postoperative abscess in the left lower abdomen. Percutaneous drain was placed on 06/05/2023. Patient reports minimal output. EXAM: DRAIN INJECTION WITH FLUOROSCOPY COMPARISON:  CT abdomen and pelvis 07/25/2023 MEDICATIONS: None ANESTHESIA/SEDATION: None FLUOROSCOPY: Radiation Exposure Index  (as provided by the fluoroscopic device): 19 mGy Kerma COMPLICATIONS: None immediate. TECHNIQUE: Patient was placed supine on the interventional table. Scout image was obtained. 5 mL Omnipaque  300 was injected through the drain with fluoroscopy. Contrast was aspirated at the end of the procedure. PROCEDURE: The catheter was cut and removed without complication. Bandage placed at the old drain site. FINDINGS: Scout image demonstrates contrast in the urinary bladder and the drain is in left lower quadrant of the abdomen. Contrast fills a small pocket around the drain but there is no evidence for a bowel fistula. Contrast was also refluxing along the drain tract. IMPRESSION: 1. Decompressed collection around the drain. No evidence for a bowel fistula. 2. The collection was also decompressed on today's CT and the patient has minimal output. As a result, the drain was removed. Electronically Signed   By: Elene Griffes M.D.   On: 07/25/2023 16:11     ASSESSMENT & PLAN ARYO ASMAN 68 y.o. male with medical history significant for T cell lymphoma who presents for a follow up visit.    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 Mesenteric margin is positive.  --Discussed MEITL is a rare subtype of peripheral T-cell lymphoma, approximately less than 5% of cases.  --Reviewed that the treatment strategy will include CHOP based chemotherapy. Reviewed frequency and common side effects.  -- baseline PET imaging complete on 05/02/2023.  --Started CHOEP on 07/28/2023. Plan for repeat PET CT scan after Cycle 3 of CHOEP.  Ideally he will only require 3 cycles of CHOEP (he already received 3 cycles of CHOP) PLAN: --Presents today after  completion of Cycle 1 of CHOEP last week. --Patient developed significant nausea/vomiting after C1D1 as he didn't receive appropriate antiemetics during his infusion. He reports the symptoms resolved with C1D2.  --Labs from today were reviewed and expected after chemotherapy. WBC 2.0, Hgb 11.1, Plt 106, Creatinine and LFTs in range.  --RTC on 08/19/2023 with labs and follow up prior to Cycle 2, Day 1.   #Upper respiratory congestion #Vertigo --Recommend to continue with claritin  and add nasonex and mucinex.   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    #Abdominal pain: --Improving after surgery.  --Drain removed. --Currently on oxycodone  5-10 mg q 4 hours  #Supportive Care -- chemotherapy education complete -- port placed --TTE complete, adequate for treatment.  -- zofran  8mg  q8H PRN and compazine  10mg  PO q6H for nausea -- acyclovir 400mg  PO BID for VCZ prophylaxis -- allopurinol  300mg  PO daily for TLS prophylaxis -- EMLA  cream for port   No orders of the defined types were placed in this encounter.   All questions were answered. The patient knows to call the clinic with any problems, questions or concerns.  A total of more than 30 minutes were spent on this encounter with face-to-face time and non-face-to-face time, including preparing to see the patient, ordering tests and/or medications, counseling the patient and coordination of care as outlined above.   Elijah Soohoo  PA-C Dept of Hematology and Oncology [facility] at [facility] Phone: [phone]   08/07/2023 9:25 PM

## 2023-08-18 ENCOUNTER — Other Ambulatory Visit (HOSPITAL_COMMUNITY): Payer: Self-pay

## 2023-08-18 ENCOUNTER — Other Ambulatory Visit: Payer: Self-pay

## 2023-08-19 ENCOUNTER — Inpatient Hospital Stay (HOSPITAL_BASED_OUTPATIENT_CLINIC_OR_DEPARTMENT_OTHER): Admitting: Hematology and Oncology

## 2023-08-19 ENCOUNTER — Ambulatory Visit

## 2023-08-19 ENCOUNTER — Ambulatory Visit: Admitting: Hematology and Oncology

## 2023-08-19 ENCOUNTER — Inpatient Hospital Stay

## 2023-08-19 ENCOUNTER — Other Ambulatory Visit

## 2023-08-19 VITALS — BP 133/85 | HR 86 | Temp 98.0°F | Resp 14 | Wt 236.7 lb

## 2023-08-19 DIAGNOSIS — Z95828 Presence of other vascular implants and grafts: Secondary | ICD-10-CM

## 2023-08-19 DIAGNOSIS — C8449 Peripheral T-cell lymphoma, not classified, extranodal and solid organ sites: Secondary | ICD-10-CM

## 2023-08-19 DIAGNOSIS — Z5111 Encounter for antineoplastic chemotherapy: Secondary | ICD-10-CM | POA: Diagnosis not present

## 2023-08-19 LAB — CMP (CANCER CENTER ONLY)
ALT: 28 U/L (ref 0–44)
AST: 14 U/L — ABNORMAL LOW (ref 15–41)
Albumin: 4 g/dL (ref 3.5–5.0)
Alkaline Phosphatase: 77 U/L (ref 38–126)
Anion gap: 6 (ref 5–15)
BUN: 24 mg/dL — ABNORMAL HIGH (ref 8–23)
CO2: 25 mmol/L (ref 22–32)
Calcium: 9.3 mg/dL (ref 8.9–10.3)
Chloride: 110 mmol/L (ref 98–111)
Creatinine: 1.01 mg/dL (ref 0.61–1.24)
GFR, Estimated: >60 mL/min (ref >60)
Glucose, Bld: 159 mg/dL — ABNORMAL HIGH (ref 70–99)
Potassium: 3.7 mmol/L (ref 3.5–5.1)
Sodium: 141 mmol/L (ref 135–145)
Total Bilirubin: 0.3 mg/dL (ref 0.0–1.2)
Total Protein: 6.8 g/dL (ref 6.5–8.1)

## 2023-08-19 LAB — CBC WITH DIFFERENTIAL (CANCER CENTER ONLY)
Abs Immature Granulocytes: 0.23 10*3/uL — ABNORMAL HIGH (ref 0.00–0.07)
Basophils Absolute: 0.2 10*3/uL — ABNORMAL HIGH (ref 0.0–0.1)
Basophils Relative: 2 %
Eosinophils Absolute: 0.1 10*3/uL (ref 0.0–0.5)
Eosinophils Relative: 1 %
HCT: 36.6 % — ABNORMAL LOW (ref 39.0–52.0)
Hemoglobin: 11.7 g/dL — ABNORMAL LOW (ref 13.0–17.0)
Immature Granulocytes: 2 %
Lymphocytes Relative: 9 %
Lymphs Abs: 0.9 10*3/uL (ref 0.7–4.0)
MCH: 29.5 pg (ref 26.0–34.0)
MCHC: 32 g/dL (ref 30.0–36.0)
MCV: 92.4 fL (ref 80.0–100.0)
Monocytes Absolute: 1.2 10*3/uL — ABNORMAL HIGH (ref 0.1–1.0)
Monocytes Relative: 12 %
Neutro Abs: 6.9 10*3/uL (ref 1.7–7.7)
Neutrophils Relative %: 74 %
Platelet Count: 318 10*3/uL (ref 150–400)
RBC: 3.96 MIL/uL — ABNORMAL LOW (ref 4.22–5.81)
RDW: 18.1 % — ABNORMAL HIGH (ref 11.5–15.5)
WBC Count: 9.4 10*3/uL (ref 4.0–10.5)
nRBC: 0 % (ref 0.0–0.2)

## 2023-08-19 MED ORDER — SODIUM CHLORIDE 0.9% FLUSH
10.0000 mL | Freq: Once | INTRAVENOUS | Status: AC
  Administered 2023-08-19: 10 mL

## 2023-08-19 MED ORDER — CYCLOPHOSPHAMIDE CHEMO INJECTION 1 GM
750.0000 mg/m2 | Freq: Once | INTRAMUSCULAR | Status: AC
  Administered 2023-08-19: 1760 mg via INTRAVENOUS
  Filled 2023-08-19: qty 88

## 2023-08-19 MED ORDER — PALONOSETRON HCL INJECTION 0.25 MG/5ML
0.2500 mg | Freq: Once | INTRAVENOUS | Status: AC
  Administered 2023-08-19: 0.25 mg via INTRAVENOUS
  Filled 2023-08-19: qty 5

## 2023-08-19 MED ORDER — SODIUM CHLORIDE 0.9% FLUSH: 10.0000 mL | INTRAVENOUS | Status: DC | PRN

## 2023-08-19 MED ORDER — DEXAMETHASONE SODIUM PHOSPHATE 10 MG/ML IJ SOLN
10.0000 mg | Freq: Once | INTRAMUSCULAR | Status: AC
  Administered 2023-08-19: 10 mg via INTRAVENOUS
  Filled 2023-08-19: qty 1

## 2023-08-19 MED ORDER — VINCRISTINE SULFATE CHEMO INJECTION 1 MG/ML
2.0000 mg | Freq: Once | INTRAVENOUS | Status: AC
  Administered 2023-08-19: 2 mg via INTRAVENOUS
  Filled 2023-08-19: qty 2

## 2023-08-19 MED ORDER — APREPITANT 130 MG/18ML IV EMUL
130.0000 mg | Freq: Once | INTRAVENOUS | Status: AC
  Administered 2023-08-19: 130 mg via INTRAVENOUS
  Filled 2023-08-19: qty 18

## 2023-08-19 MED ORDER — DOXORUBICIN HCL CHEMO IV INJECTION 2 MG/ML
50.0000 mg/m2 | Freq: Once | INTRAVENOUS | Status: AC
  Administered 2023-08-19: 118 mg via INTRAVENOUS
  Filled 2023-08-19: qty 59

## 2023-08-19 MED ORDER — SODIUM CHLORIDE 0.9 % IV SOLN: INTRAVENOUS | Status: DC

## 2023-08-19 MED ORDER — HEPARIN SOD (PORK) LOCK FLUSH 100 UNIT/ML IV SOLN
500.0000 [IU] | Freq: Once | INTRAVENOUS | Status: DC | PRN
Start: 2023-08-19 — End: 2023-08-19

## 2023-08-19 MED ORDER — SODIUM CHLORIDE 0.9 % IV SOLN
100.0000 mg/m2 | Freq: Once | INTRAVENOUS | Status: AC
  Administered 2023-08-19: 234 mg via INTRAVENOUS
  Filled 2023-08-19: qty 11.7

## 2023-08-19 NOTE — Patient Instructions (Signed)
 [facility] - A DEPT OF [facility]  Discharge Instructions: Thank you for choosing [facility] to provide your oncology and hematology care.   If you have a lab appointment with the Cancer Center, please go directly to the Cancer Center and check in at the registration area.   Wear comfortable clothing and clothing appropriate for easy access to any Portacath or PICC line.   We strive to give you quality time with your provider. You may need to reschedule your appointment if you arrive late (15 or more minutes).  Arriving late affects you and other patients whose appointments are after yours.  Also, if you miss three or more appointments without notifying the office, you may be dismissed from the clinic at the provider's discretion.      For prescription refill requests, have your pharmacy contact our office and allow 72 hours for refills to be completed.    Today you received the following chemotherapy and/or immunotherapy agents doxorubicin , vincristine , cytoxan , and etoposide       To help prevent nausea and vomiting after your treatment, we encourage you to take your nausea medication as directed.  BELOW ARE SYMPTOMS THAT SHOULD BE REPORTED IMMEDIATELY: *FEVER GREATER THAN 100.4 F (38 C) OR HIGHER *CHILLS OR SWEATING *NAUSEA AND VOMITING THAT IS NOT CONTROLLED WITH YOUR NAUSEA MEDICATION *UNUSUAL SHORTNESS OF BREATH *UNUSUAL BRUISING OR BLEEDING *URINARY PROBLEMS (pain or burning when urinating, or frequent urination) *BOWEL PROBLEMS (unusual diarrhea, constipation, pain near the anus) TENDERNESS IN MOUTH AND THROAT WITH OR WITHOUT PRESENCE OF ULCERS (sore throat, sores in mouth, or a toothache) UNUSUAL RASH, SWELLING OR PAIN  UNUSUAL VAGINAL DISCHARGE OR ITCHING   Items with * indicate a potential emergency and should be followed up as soon as possible or go to the Emergency Department if any problems should occur.  Please show the  CHEMOTHERAPY ALERT CARD or IMMUNOTHERAPY ALERT CARD at check-in to the Emergency Department and triage nurse.  Should you have questions after your visit or need to cancel or reschedule your appointment, please contact [facility] - A DEPT OF [facility]  Dept: [phone]  and follow the prompts.  Office hours are 8:00 a.m. to 4:30 p.m. Monday - Friday. Please note that voicemails left after 4:00 p.m. may not be returned until the following business day.  We are closed weekends and major holidays. You have access to a nurse at all times for urgent questions. Please call the main number to the clinic Dept: [phone] and follow the prompts.   For any non-urgent questions, you may also contact your provider using MyChart. We now offer e-Visits for anyone 76 and older to request care online for non-urgent symptoms. For details visit [email or web address].   Also download the MyChart app! Go to the app store, search "MyChart", open the app, select [facility], and log in with your MyChart username and password.

## 2023-08-19 NOTE — Progress Notes (Unsigned)
 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  CT abdomen/pelvis showed shows a mass in the left abdomen associated with the small bowel, along with pneumoperitoneum. Underwent exploratory laparotomy and small bowel resection. 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  04/23/2023: Established care with [facility]. 05/26/2023: Cycle 1 Day of CHOP chemotherapy 06/17/2023: Cycle 2 Day 1 of CHOP chemotherapy 07/07/2023: Cycle 3 Day 1 of CHOP chemotherapy 07/28/2023: Cycle 1 Day of CHOEP chemotherapy(added etop per [facility] request) 08/19/2023: Cycle 2 Day of CHOEP  Interval History:  Elijah [name] 68 y.o. male with medical history significant for T cell lymphoma who presents for a follow up visit. The patient's last visit was on 07/28/2023. In the interim since the last visit, he completed cycle 1 of CHOEP.   On exam today Elijah [name] reports he had a very hard time with his last cycle of chemotherapy.  He had vomiting for 24 hours afterwards.  He reports that for 8 to 10 hours he was "near constant".  We did apologize for the fact that he did not receive premedication with antinausea medications.  He reports that he has been having some exhaustion as well as dizziness.  He notes that his vision is occasionally blurry and he feels like his ear is clogged up.  He notes that he is not having any lower extremity swelling.  He also notes that he is not eating as well because his taste buds are off.  He did take his steroid therapy this  morning.  He is having some continued numbness of the hands and feet which is stable as compared to prior.  He has been squeezing tennis balls to try to help with his hands.  Overall he is willing and able to proceed with treatment today.  He has no other questions concerns or complaints today.  Normal  MEDICAL HISTORY:  Past Medical History:  Diagnosis Date   Acid reflux    Allergic rhinitis    Asthma    Bloating    BPH (benign prostatic hyperplasia)    Depression    ED (erectile dysfunction)    Epigastric pain    Exercise-induced asthma    GERD (gastroesophageal reflux disease)    History of kidney stones    Hypercholesteremia    Migraine 2009   Polymyalgia rheumatica (HCC)    Prediabetes    started on metformin  during january hospitalization   Prostate disease    Sleep apnea     SURGICAL HISTORY: Past Surgical History:  Procedure Laterality Date   ANKLE SURGERY Right    BOWEL RESECTION N/A 04/10/2023   Procedure: SMALL BOWEL RESECTION WITH MASS;  Surgeon: Cannon Champion, MD;  Location: [facility] OR;  Service: General;  Laterality: N/A;   CHEST TUBE INSERTION Left    COLONOSCOPY     CYSTOSCOPY WITH RETROGRADE PYELOGRAM, URETEROSCOPY AND STENT PLACEMENT Left 06/29/2020   Procedure: CYSTOSCOPY WITH RETROGRADE PYELOGRAM, URETEROSCOPY , BASKET EXTRACTION, AND STENT PLACEMENT;  Surgeon: Osborn Blaze, MD;  Location: [facility];  Service: Urology;  Laterality: Left;  1 HR   IR CV LINE INJECTION  07/25/2023   IR IMAGING GUIDED PORT INSERTION  05/13/2023   KNEE SURGERY Right    LAPAROTOMY N/A 04/10/2023   Procedure: EXPLORATORY LAPAROTOMY;  Surgeon: Cannon Champion, MD;  Location: [facility] OR;  Service: General;  Laterality: N/A;   SKIN CANCER EXCISION     unsure   TONSILLECTOMY     VASECTOMY     WISDOM TOOTH EXTRACTION      SOCIAL HISTORY: Social History   Socioeconomic History   Marital status: Married    Spouse name: Not on file   Number of children: Not on file   Years of  education: Not on file   Highest education level: Not on file  Occupational History   Not on file  Tobacco Use   Smoking status: Never    Passive exposure: Never   Smokeless tobacco: Never  Vaping Use   Vaping status: Never Used  Substance and Sexual Activity   Alcohol use: Yes    Comment: social   Drug use: Never   Sexual activity: Not on file  Other Topics Concern   Not on file  Social History Narrative   Not on file   Social Drivers of Health   Financial Resource Strain: Not on file  Food Insecurity: No Food Insecurity (06/03/2023)   Hunger Vital Sign    Worried About Running Out of Food in the Last Year: Never true    Ran Out of Food in the Last Year: Never true  Transportation Needs: No Transportation Needs (06/03/2023)   PRAPARE - Administrator, Civil Service (Medical): No    Lack of Transportation (Non-Medical): No  Physical Activity: Not on file  Stress: Not on file  Social Connections: Socially Integrated (06/04/2023)   Social Connection and Isolation Panel [NHANES]    Frequency of Communication with Friends and Family: More than three times a week    Frequency of Social Gatherings with Friends and Family: Three times a week    Attends Religious Services: More than 4 times per year    Active Member of Clubs or Organizations: No    Attends Banker Meetings: 1 to 4 times per year    Marital Status: Married  Catering manager Violence: Not At Risk (06/03/2023)   Humiliation, Afraid, Rape, and Kick questionnaire    Fear of Current or Ex-Partner: No    Emotionally Abused: No    Physically Abused: No    Sexually Abused: No    FAMILY HISTORY: Family History  Problem Relation Age of Onset   Healthy Son    Healthy Daughter     ALLERGIES:  is allergic to singulair [montelukast], amlodipine , and crestor [rosuvastatin].  MEDICATIONS:  Current Outpatient Medications  Medication Sig Dispense Refill   Accu-Chek Softclix Lancets lancets Use daily  before breakfast. 100 each 0   albuterol  (VENTOLIN  HFA) 108 (90 Base) MCG/ACT inhaler Inhale 2 puffs into the lungs as needed (max of 12 puffs per day) 6.7 g 0   alfuzosin  (UROXATRAL ) 10 MG 24 hr tablet Take 1 tablet (10 mg total) by mouth daily. 90 tablet 0   allopurinol  (ZYLOPRIM ) 300 MG tablet Take 1 tablet (300 mg total) by mouth daily. 90 tablet 1   amoxicillin -clavulanate (AUGMENTIN ) 875-125 MG tablet Take 1 tablet (875 mg total) by mouth every 12 (twelve) hours for 120 days 60 tablet 3   Blood Glucose Monitoring Suppl (ACCU-CHEK GUIDE) w/Device KIT Use daily before breakfast. 1 kit 0   CINNAMON PO Take 1 capsule by mouth daily.     finasteride  (PROSCAR )  5 MG tablet Take 1 tablet (5 mg total) by mouth daily. 90 tablet 3   glucose blood (ACCU-CHEK GUIDE TEST) test strip Use daily before breakfast. 50 each 0   Glucose Blood (BLOOD GLUCOSE TEST STRIPS) STRP Use to check blood sugar before breakfast as directed 100 strip 0   lidocaine -prilocaine  (EMLA ) cream Apply 1 Application topically as needed. 30 g 0   loratadine  (CLARITIN ) 10 MG tablet Take 10 mg by mouth daily.     methocarbamol  (ROBAXIN ) 500 MG tablet Take 2 tablets (1,000 mg total) by mouth every 8 (eight) hours as needed for muscle spasms. 60 tablet 1   minoxidil  (LONITEN ) 2.5 MG tablet Take 1 tablet (2.5 mg total) by mouth daily. 365 tablet 1   ondansetron  (ZOFRAN ) 8 MG tablet Take 1 tablet (8 mg total) by mouth every 8 (eight) hours as needed. 30 tablet 0   oxyCODONE  (OXY IR/ROXICODONE ) 5 MG immediate release tablet Take 1-2 tablets (5-10 mg total) by mouth every 6 (six) hours as needed for severe pain (pain score 7-10). 180 tablet 0   pantoprazole  (PROTONIX ) 40 MG tablet Take 1 tablet (40 mg total) by mouth daily 30 minutes to 1 hour before morning meal. 30 tablet 5   predniSONE  (DELTASONE ) 1 MG tablet Take 3 tablets by mouth daily, along with a 5mg  tablet for a total of 8mg  daily for 1 month. Taper by 1mg  monthly. 90 tablet 0    predniSONE  (DELTASONE ) 20 MG tablet Take 3 tablets (60 mg total) by mouth daily with breakfast. Take for 5 days starting day one of chemotherapy 15 tablet 5   Probiotic TBEC Take 1 capsule by mouth daily.     prochlorperazine  (COMPAZINE ) 10 MG tablet Take 1 tablet (10 mg total) by mouth every 6 (six) hours as needed for nausea or vomiting. 30 tablet 0   No current facility-administered medications for this visit.    REVIEW OF SYSTEMS:   All other systems were reviewed with the patient and are negative.  PHYSICAL EXAMINATION: ECOG PERFORMANCE STATUS: 1 - Symptomatic but completely ambulatory  Vitals:   08/19/23 0953  BP: 133/85  Pulse: 86  Resp: 14  Temp: 98 F (36.7 C)  SpO2: 97%     Filed Weights   08/19/23 0953  Weight: 236 lb 11.2 oz (107.4 kg)      GENERAL: Well-appearing middle-age Caucasian male, alert, no distress and comfortable SKIN: skin color, texture, turgor are normal, no rashes or significant lesions EYES: conjunctiva are pink and non-injected, sclera clear LUNGS: clear to auscultation and percussion with normal breathing effort HEART: regular rate & rhythm and no murmurs and no lower extremity edema Musculoskeletal: no cyanosis of digits and no clubbing  PSYCH: alert & oriented x 3, fluent speech NEURO: no focal motor/sensory deficits  LABORATORY DATA:  I have reviewed the data as listed    Latest Ref Rng & Units 08/19/2023    9:26 AM 08/05/2023   12:26 PM 07/28/2023    8:36 AM  CBC  WBC 4.0 - 10.5 K/uL 9.4  2.0  8.3   Hemoglobin 13.0 - 17.0 g/dL 47.8  29.5  62.1   Hematocrit 39.0 - 52.0 % 36.6  33.3  37.9   Platelets 150 - 400 K/uL 318  106  295        Latest Ref Rng & Units 08/19/2023    9:26 AM 08/05/2023   12:26 PM 07/28/2023    8:36 AM  CMP  Glucose 70 - 99 mg/dL  159  158  163   BUN 8 - 23 mg/dL 24  18  22    Creatinine 0.61 - 1.24 mg/dL 1.61  0.96  0.45   Sodium 135 - 145 mmol/L 141  140  139   Potassium 3.5 - 5.1 mmol/L 3.7  3.5  3.8    Chloride 98 - 111 mmol/L 110  108  107   CO2 22 - 32 mmol/L 25  25  25    Calcium 8.9 - 10.3 mg/dL 9.3  8.9  9.3   Total Protein 6.5 - 8.1 g/dL 6.8  6.5  6.8   Total Bilirubin 0.0 - 1.2 mg/dL 0.3  0.5  0.3   Alkaline Phos 38 - 126 U/L 77  62  64   AST 15 - 41 U/L 14  9  14    ALT 0 - 44 U/L 28  19  27      No results found for: "MPROTEIN" No results found for: "KPAFRELGTCHN", "LAMBDASER", "KAPLAMBRATIO"  RADIOGRAPHIC STUDIES: I have personally reviewed the radiological images as listed and agreed with the findings in the report. No results found.    ASSESSMENT & PLAN Elijah [name] 68 y.o. male with medical history significant for T cell lymphoma who presents for a follow up visit.    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 Mesenteric margin is positive.  --Discussed MEITL is a rare subtype of peripheral T-cell lymphoma, approximately less than 5% of cases.  --Reviewed that the treatment strategy will include CHOP based chemotherapy. Reviewed frequency and common side effects.  -- baseline PET imaging complete on 05/02/2023.  --Started CHOEP on 07/28/2023. Plan for repeat PET CT scan after Cycle 3 of CHOEP.  Ideally he will only require 3 cycles of CHOEP (he already received 3 cycles of CHOP) PLAN: --Presents today prior to Cycle 2 of CHOEP --Patient developed significant nausea/vomiting after C1D1 as he didn't receive appropriate antiemetics during his infusion. --Labs from today were reviewed and expected after chemotherapy. WBC 9.4, hemoglobin 11.7, MCV 92.4, platelets 318, Creatinine and LFTs in range.  --Patient notes that his PET CT scan will be ordered at [facility] after completion of cycle  3. --Appreciate the recommendations and assistance from [facility] oncology --RTC on 09/08/2023 with labs and follow up prior to Cycle 3, Day 1.   #Upper respiratory congestion #Vertigo --Recommend to continue with claritin  and add nasonex and mucinex.   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    #Abdominal pain: --Improving after surgery.  --Drain removed. --Currently on oxycodone  5-10 mg q 4 hours  #Supportive Care -- chemotherapy education complete -- port placed --TTE complete, adequate for treatment.  -- zofran  8mg  q8H PRN and compazine  10mg  PO q6H for nausea -- acyclovir 400mg  PO BID for VCZ prophylaxis -- allopurinol  300mg  PO daily for TLS prophylaxis -- EMLA  cream for port   No orders of the defined types were placed in this encounter.   All questions were answered. The patient knows to call the clinic with any problems, questions or concerns.  A total of more than 30 minutes were spent on this encounter with face-to-face time and non-face-to-face time, including preparing to see the patient, ordering tests and/or medications, counseling the patient  and coordination of care as outlined above.   Rogerio Clay, MD Department of Hematology/Oncology [facility] Phone: [phone] Pager: [phone] Email: [email or web address]    08/26/2023 7:08 PM

## 2023-08-20 ENCOUNTER — Inpatient Hospital Stay

## 2023-08-20 VITALS — BP 143/83 | HR 79 | Temp 98.5°F | Resp 16

## 2023-08-20 DIAGNOSIS — Z5111 Encounter for antineoplastic chemotherapy: Secondary | ICD-10-CM | POA: Diagnosis not present

## 2023-08-20 DIAGNOSIS — C8449 Peripheral T-cell lymphoma, not classified, extranodal and solid organ sites: Secondary | ICD-10-CM

## 2023-08-20 MED ORDER — SODIUM CHLORIDE 0.9 % IV SOLN: INTRAVENOUS | Status: DC

## 2023-08-20 MED ORDER — SODIUM CHLORIDE 0.9 % IV SOLN
100.0000 mg/m2 | Freq: Once | INTRAVENOUS | Status: AC
  Administered 2023-08-20: 234 mg via INTRAVENOUS
  Filled 2023-08-20: qty 11.7

## 2023-08-20 MED ORDER — SODIUM CHLORIDE 0.9% FLUSH: 10.0000 mL | INTRAVENOUS | Status: DC | PRN

## 2023-08-20 MED ORDER — HEPARIN SOD (PORK) LOCK FLUSH 100 UNIT/ML IV SOLN: 500.0000 [IU] | Freq: Once | INTRAVENOUS | Status: DC | PRN

## 2023-08-20 MED ORDER — DEXAMETHASONE SODIUM PHOSPHATE 10 MG/ML IJ SOLN
10.0000 mg | Freq: Once | INTRAMUSCULAR | Status: AC
  Administered 2023-08-20: 10 mg via INTRAVENOUS
  Filled 2023-08-20: qty 1

## 2023-08-20 NOTE — Patient Instructions (Signed)
 [facility] - A DEPT OF [facility]  Discharge Instructions: Thank you for choosing [facility] to provide your oncology and hematology care.   If you have a lab appointment with the Cancer Center, please go directly to the Cancer Center and check in at the registration area.   Wear comfortable clothing and clothing appropriate for easy access to any Portacath or PICC line.   We strive to give you quality time with your provider. You may need to reschedule your appointment if you arrive late (15 or more minutes).  Arriving late affects you and other patients whose appointments are after yours.  Also, if you miss three or more appointments without notifying the office, you may be dismissed from the clinic at the provider's discretion.      For prescription refill requests, have your pharmacy contact our office and allow 72 hours for refills to be completed.    Today you received the following chemotherapy and/or immunotherapy agents: etoposide      To help prevent nausea and vomiting after your treatment, we encourage you to take your nausea medication as directed.  BELOW ARE SYMPTOMS THAT SHOULD BE REPORTED IMMEDIATELY: *FEVER GREATER THAN 100.4 F (38 C) OR HIGHER *CHILLS OR SWEATING *NAUSEA AND VOMITING THAT IS NOT CONTROLLED WITH YOUR NAUSEA MEDICATION *UNUSUAL SHORTNESS OF BREATH *UNUSUAL BRUISING OR BLEEDING *URINARY PROBLEMS (pain or burning when urinating, or frequent urination) *BOWEL PROBLEMS (unusual diarrhea, constipation, pain near the anus) TENDERNESS IN MOUTH AND THROAT WITH OR WITHOUT PRESENCE OF ULCERS (sore throat, sores in mouth, or a toothache) UNUSUAL RASH, SWELLING OR PAIN  UNUSUAL VAGINAL DISCHARGE OR ITCHING   Items with * indicate a potential emergency and should be followed up as soon as possible or go to the Emergency Department if any problems should occur.  Please show the CHEMOTHERAPY ALERT CARD or IMMUNOTHERAPY  ALERT CARD at check-in to the Emergency Department and triage nurse.  Should you have questions after your visit or need to cancel or reschedule your appointment, please contact [facility] - A DEPT OF [facility]  Dept: [phone]  and follow the prompts.  Office hours are 8:00 a.m. to 4:30 p.m. Monday - Friday. Please note that voicemails left after 4:00 p.m. may not be returned until the following business day.  We are closed weekends and major holidays. You have access to a nurse at all times for urgent questions. Please call the main number to the clinic Dept: [phone] and follow the prompts.   For any non-urgent questions, you may also contact your provider using MyChart. We now offer e-Visits for anyone 40 and older to request care online for non-urgent symptoms. For details visit [email or web address].   Also download the MyChart app! Go to the app store, search "MyChart", open the app, select [place], and log in with your MyChart username and password.

## 2023-08-21 ENCOUNTER — Ambulatory Visit

## 2023-08-21 ENCOUNTER — Inpatient Hospital Stay

## 2023-08-21 VITALS — BP 162/73 | HR 83 | Temp 97.9°F | Resp 18

## 2023-08-21 DIAGNOSIS — C8449 Peripheral T-cell lymphoma, not classified, extranodal and solid organ sites: Secondary | ICD-10-CM

## 2023-08-21 DIAGNOSIS — Z5111 Encounter for antineoplastic chemotherapy: Secondary | ICD-10-CM | POA: Diagnosis not present

## 2023-08-21 MED ORDER — DEXAMETHASONE SODIUM PHOSPHATE 10 MG/ML IJ SOLN
10.0000 mg | Freq: Once | INTRAMUSCULAR | Status: AC
  Administered 2023-08-21: 10 mg via INTRAVENOUS
  Filled 2023-08-21: qty 1

## 2023-08-21 MED ORDER — HEPARIN SOD (PORK) LOCK FLUSH 100 UNIT/ML IV SOLN
500.0000 [IU] | Freq: Once | INTRAVENOUS | Status: AC | PRN
  Administered 2023-08-21: 500 [IU]

## 2023-08-21 MED ORDER — SODIUM CHLORIDE 0.9 % IV SOLN
100.0000 mg/m2 | Freq: Once | INTRAVENOUS | Status: AC
  Administered 2023-08-21: 234 mg via INTRAVENOUS
  Filled 2023-08-21: qty 11.7

## 2023-08-21 MED ORDER — SODIUM CHLORIDE 0.9 % IV SOLN: INTRAVENOUS | Status: DC

## 2023-08-21 MED ORDER — SODIUM CHLORIDE 0.9% FLUSH
10.0000 mL | INTRAVENOUS | Status: DC | PRN
  Administered 2023-08-21: 10 mL

## 2023-08-21 NOTE — Patient Instructions (Signed)
 [facility] - A DEPT OF [facility]  Discharge Instructions: Thank you for choosing [facility] to provide your oncology and hematology care.   If you have a lab appointment with the Cancer Center, please go directly to the Cancer Center and check in at the registration area.   Wear comfortable clothing and clothing appropriate for easy access to any Portacath or PICC line.   We strive to give you quality time with your provider. You may need to reschedule your appointment if you arrive late (15 or more minutes).  Arriving late affects you and other patients whose appointments are after yours.  Also, if you miss three or more appointments without notifying the office, you may be dismissed from the clinic at the provider's discretion.      For prescription refill requests, have your pharmacy contact our office and allow 72 hours for refills to be completed.    Today you received the following chemotherapy and/or immunotherapy agents: Etoposide      To help prevent nausea and vomiting after your treatment, we encourage you to take your nausea medication as directed.  BELOW ARE SYMPTOMS THAT SHOULD BE REPORTED IMMEDIATELY: *FEVER GREATER THAN 100.4 F (38 C) OR HIGHER *CHILLS OR SWEATING *NAUSEA AND VOMITING THAT IS NOT CONTROLLED WITH YOUR NAUSEA MEDICATION *UNUSUAL SHORTNESS OF BREATH *UNUSUAL BRUISING OR BLEEDING *URINARY PROBLEMS (pain or burning when urinating, or frequent urination) *BOWEL PROBLEMS (unusual diarrhea, constipation, pain near the anus) TENDERNESS IN MOUTH AND THROAT WITH OR WITHOUT PRESENCE OF ULCERS (sore throat, sores in mouth, or a toothache) UNUSUAL RASH, SWELLING OR PAIN  UNUSUAL VAGINAL DISCHARGE OR ITCHING   Items with * indicate a potential emergency and should be followed up as soon as possible or go to the Emergency Department if any problems should occur.  Please show the CHEMOTHERAPY ALERT CARD or IMMUNOTHERAPY  ALERT CARD at check-in to the Emergency Department and triage nurse.  Should you have questions after your visit or need to cancel or reschedule your appointment, please contact [facility] - A DEPT OF [facility]  Dept: [phone]  and follow the prompts.  Office hours are 8:00 a.m. to 4:30 p.m. Monday - Friday. Please note that voicemails left after 4:00 p.m. may not be returned until the following business day.  We are closed weekends and major holidays. You have access to a nurse at all times for urgent questions. Please call the main number to the clinic Dept: [phone] and follow the prompts.   For any non-urgent questions, you may also contact your provider using MyChart. We now offer e-Visits for anyone 10 and older to request care online for non-urgent symptoms. For details visit [email or web address].   Also download the MyChart app! Go to the app store, search "MyChart", open the app, select [place], and log in with your MyChart username and password.

## 2023-08-23 ENCOUNTER — Inpatient Hospital Stay

## 2023-08-23 VITALS — BP 141/93 | HR 75 | Temp 97.7°F | Resp 18

## 2023-08-23 DIAGNOSIS — C8449 Peripheral T-cell lymphoma, not classified, extranodal and solid organ sites: Secondary | ICD-10-CM

## 2023-08-23 DIAGNOSIS — Z5111 Encounter for antineoplastic chemotherapy: Secondary | ICD-10-CM | POA: Diagnosis not present

## 2023-08-23 MED ORDER — PEGFILGRASTIM-FPGK 6 MG/0.6ML ~~LOC~~ SOSY
6.0000 mg | PREFILLED_SYRINGE | Freq: Once | SUBCUTANEOUS | Status: AC
  Administered 2023-08-23: 6 mg via SUBCUTANEOUS
  Filled 2023-08-23: qty 0.6

## 2023-08-23 NOTE — Patient Instructions (Signed)

## 2023-08-26 ENCOUNTER — Encounter: Payer: Self-pay | Admitting: Hematology and Oncology

## 2023-08-27 NOTE — Progress Notes (Signed)
 Social Work Psychosocial Assessment  PATIENT: Elijah [name]                                                              Date: 08/26/2023 MRN: [id number]    Marital Status: Married  Referral information:  Referral Source: Physician Referral Reason: Psychosocial Assessment Reason: Stem Cell Transplant  Patient Information:  Primary Language: English Primary Caregiver: Self Accompanied By/Relationship: Pt was accompanied by his wife, Elijah [name]. Living Arrangements: Family/Others Patient's support system includes: Spouse, Children, Friends Name, phone number, and availability of support person: Pt's wife, Elijah [name], [phone] Would you like to name someone who will help you at home and receive information at discharge?: Yes Contact Name: Elijah [name]   Who would you like to make medical decisions if you are unable?: Spouse Does that person know you want them to act in this capacity?: Yes Have you discussed your wishes with this person? : Yes Have you written your plans down in an Advance Directive or other legal document? : Yes (Pt agrees to bring a copy of his Healthcare Power of Attorney to [facility]   Religious/Cultural Factors: Pt reports he is spiritual but does not attend church. Literacy: Can Read, Can Write Highest Level of Education: College/University Mode of Transportation: Other (Comment) (Pt's wife drives pt.)  Current Behavioral Health Concern(s): None Is Patient At Risk For Suicidal/Homicidal Behavior?: No Behavioral Health History: No Substance Abuse History: No Current Risk of Threats/Harm: No-Indicator And/Or Concern History of Threats/Harm: No   Income Information:  Does the patient have insurance coverage for potential prescription, DME, and other post-acute needs?: Medicare part A, Medicare part B, Nurse, learning disability carrier Administrator is secondary.) Payrange:  (9S) Application Status: Needs Chartered loss adjuster Resources:  Retirement Income, Family  Functional Status Prior to Admission:    IADLs: Independent Ambulation/Mobility: Photographer Device: None  Current Mental Status:  Patient's mental status at this time: Alert, Oriented to person, Oriented to place, Oriented to time, Oriented to situation Appearance/Hygiene: Neat Behavior: Calm Eye Contact: Good Speech Quantity: Normal Speech Quality: Normal Affect: Within Normal Limits Mood: Appropriate Thought Processes: Linear, Logical, Goal Directed Memory: No Deficits Reported  Summary Assessment:  SW outpatient comprehensive assessment was completed with pt and pt's spouse over video. SW completed chart review prior to this visit.   Elijah [name] is a 68 y.o. male diagnosed with Monomorphic Epitheliotropic Intestinal T-Cell Lymphoma and a Allogeneic-MUD stem cell transplant candidate. He lives with his wife in [place], [place]; approx 3 hrs from [facility]. Pt's social support includes 2 adult children, a business partner who has had cancer herself, friends, and a Landscape architect.   SW reviewed SCT process, timeline, and 24-hr caregiver expectations. Pt identified his primary caregiver as his wife, Elijah [name], [phone], who is retired. SW completed caregiver assessment and caregiver denies obstacles to meeting 24 hr caregiver expectations. Pt identified his back-up caregiver as his two adult children: Pt's daughter, Elijah [name], [phone], who is a Education officer, environmental at [employer] and works in [place] and pt's son, Elijah [name], [phone], who works remotely. Pt also has friends who are healthcare professionals who are willing to help. All caregivers drive and have access to a vehicle. SW discussed the importance of self-care for the caregiver. Elijah [name] inquired about work-out facilities in/ around  the hospital available for visitors. SW recommended Elijah [name] walk on/ around campus to avoid [employer] facilities.    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 Based on reported household income, pt was encouraged to apply for [employer], has been provided the application, and notified of the [facility] FA office location. SW also agreed to assist with the NMD[employer] grant if eligible.   Lodging: Pt was provided a list of local lodging options. Pt has T/L benefits provided through [employer]. Pt was encouraged to book a room with a kitchenette and to inquire about a [facility] discount. SW recommended pt begin lodging T+14 and communicate with the hotel to modify as discharge date becomes more certain. Pt will establish and manage this reservation.   SW described SCT unit, hospital room, and [facility] visitor policy. Pt will stay occupied inpatient reading, walking the hall, working with PT, and visiting with family. Pt is an avid reader and enjoys reading history, science fiction, and adventure. Pt's wife Elijah [name] will stay overnight in the room with pt. SW informed pt of [facility] [employer]. Pt denied concerns at this time regarding his inpatient stay.   Pt's mental health was assessed using the PHQ-2. Pt scored a 0; denying depression over the last two weeks. Pt denies a mental health history, but does report understandably having some down moments. Pt became tearful at one point and reports the prednisone  has made him more emotional. Pt denies SH/SI. SW provided brief psychoeducation about symptoms of depression and advised the patient follow-up with a medical team member if symptoms arise. Pt is not interested in psychotherapy at this time. Pt copes positively with stress by walking with his wife and lifting weights. SW discussed with pt how to balance exerting strength and staying active while at the same time listening to his body and not pushing too hard. 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   AMD: Not on file. Pt elects his spouse to be his primary medical decision maker. In the absence of Advance Medical Directive, per [place] statute, surrogate medical decision maker would be determined according to legal next of kin. Per pt report, surrogate medical decision maker would be pt's wife Elijah [name].   Based on this assessment, pt has the social support, caregiving, and financial resources necessary to proceed with SCT.  Plan/Referrals:  SW received a referral from the multidisciplinary transplant team to evaluate this patient for psychosocial assessment and suitability for stem cell transplantation. SW provided education about lodging, caregiving, psychosocial support for transplant, and Advance Directive. SW answered all non-clinical questions as it relates to SCT, and referred any clinical questions to the appropriate clinical treatment team.   SW provided contact information, encouraged patient to reach out with any questions or concerns, and will continue to support and follow patient as needed throughout the SCT process.  PLAN:  Urgent overnight needs ED Social Worker: [facility].  Weekend and Holiday needs: [facility]   Referrals:    Referral: Patient Financial Services- RACHELL Erich Clive DELENA Barry               [phone]/ [phone]   Clinical Social Worker  Phone/PIC #

## 2023-09-08 ENCOUNTER — Inpatient Hospital Stay

## 2023-09-08 ENCOUNTER — Inpatient Hospital Stay: Attending: Physician Assistant

## 2023-09-08 ENCOUNTER — Inpatient Hospital Stay (HOSPITAL_BASED_OUTPATIENT_CLINIC_OR_DEPARTMENT_OTHER): Admitting: Hematology and Oncology

## 2023-09-08 VITALS — BP 159/93 | HR 86 | Temp 97.6°F | Resp 16 | Wt 240.2 lb

## 2023-09-08 VITALS — BP 157/95 | HR 84 | Temp 98.1°F | Resp 18

## 2023-09-08 DIAGNOSIS — Z95828 Presence of other vascular implants and grafts: Secondary | ICD-10-CM

## 2023-09-08 DIAGNOSIS — C862 Enteropathy-type (intestinal) T-cell lymphoma not having achieved remission: Secondary | ICD-10-CM | POA: Diagnosis present

## 2023-09-08 DIAGNOSIS — Z5111 Encounter for antineoplastic chemotherapy: Secondary | ICD-10-CM | POA: Diagnosis present

## 2023-09-08 DIAGNOSIS — C8449 Peripheral T-cell lymphoma, not classified, extranodal and solid organ sites: Secondary | ICD-10-CM

## 2023-09-08 DIAGNOSIS — Z5189 Encounter for other specified aftercare: Secondary | ICD-10-CM | POA: Diagnosis not present

## 2023-09-08 DIAGNOSIS — Z79899 Other long term (current) drug therapy: Secondary | ICD-10-CM | POA: Insufficient documentation

## 2023-09-08 LAB — CMP (CANCER CENTER ONLY)
ALT: 33 U/L (ref 0–44)
AST: 18 U/L (ref 15–41)
Albumin: 4.1 g/dL (ref 3.5–5.0)
Alkaline Phosphatase: 83 U/L (ref 38–126)
Anion gap: 8 (ref 5–15)
BUN: 23 mg/dL (ref 8–23)
CO2: 23 mmol/L (ref 22–32)
Calcium: 9 mg/dL (ref 8.9–10.3)
Chloride: 108 mmol/L (ref 98–111)
Creatinine: 1.1 mg/dL (ref 0.61–1.24)
GFR, Estimated: >60 mL/min (ref >60)
Glucose, Bld: 175 mg/dL — ABNORMAL HIGH (ref 70–99)
Potassium: 3.6 mmol/L (ref 3.5–5.1)
Sodium: 139 mmol/L (ref 135–145)
Total Bilirubin: 0.2 mg/dL (ref 0.0–1.2)
Total Protein: 6.7 g/dL (ref 6.5–8.1)

## 2023-09-08 LAB — CBC WITH DIFFERENTIAL (CANCER CENTER ONLY)
Abs Immature Granulocytes: 0.58 10*3/uL — ABNORMAL HIGH (ref 0.00–0.07)
Basophils Absolute: 0.1 10*3/uL (ref 0.0–0.1)
Basophils Relative: 1 %
Eosinophils Absolute: 0.2 10*3/uL (ref 0.0–0.5)
Eosinophils Relative: 1 %
HCT: 34 % — ABNORMAL LOW (ref 39.0–52.0)
Hemoglobin: 11.3 g/dL — ABNORMAL LOW (ref 13.0–17.0)
Immature Granulocytes: 5 %
Lymphocytes Relative: 9 %
Lymphs Abs: 1.1 10*3/uL (ref 0.7–4.0)
MCH: 31.1 pg (ref 26.0–34.0)
MCHC: 33.2 g/dL (ref 30.0–36.0)
MCV: 93.7 fL (ref 80.0–100.0)
Monocytes Absolute: 1.3 10*3/uL — ABNORMAL HIGH (ref 0.1–1.0)
Monocytes Relative: 11 %
Neutro Abs: 8.7 10*3/uL — ABNORMAL HIGH (ref 1.7–7.7)
Neutrophils Relative %: 73 %
Platelet Count: 263 10*3/uL (ref 150–400)
RBC: 3.63 MIL/uL — ABNORMAL LOW (ref 4.22–5.81)
RDW: 16.9 % — ABNORMAL HIGH (ref 11.5–15.5)
WBC Count: 11.9 10*3/uL — ABNORMAL HIGH (ref 4.0–10.5)
nRBC: 0.4 % — ABNORMAL HIGH (ref 0.0–0.2)

## 2023-09-08 MED ORDER — SODIUM CHLORIDE 0.9 % IV SOLN
100.0000 mg/m2 | Freq: Once | INTRAVENOUS | Status: AC
  Administered 2023-09-08: 234 mg via INTRAVENOUS
  Filled 2023-09-08: qty 11.7

## 2023-09-08 MED ORDER — HEPARIN SOD (PORK) LOCK FLUSH 100 UNIT/ML IV SOLN
500.0000 [IU] | Freq: Once | INTRAVENOUS | Status: AC | PRN
  Administered 2023-09-08: 500 [IU]

## 2023-09-08 MED ORDER — VINCRISTINE SULFATE CHEMO INJECTION 1 MG/ML
2.0000 mg | Freq: Once | INTRAVENOUS | Status: AC
  Administered 2023-09-08: 2 mg via INTRAVENOUS
  Filled 2023-09-08: qty 2

## 2023-09-08 MED ORDER — APREPITANT 130 MG/18ML IV EMUL
130.0000 mg | Freq: Once | INTRAVENOUS | Status: AC
  Administered 2023-09-08: 130 mg via INTRAVENOUS
  Filled 2023-09-08: qty 18

## 2023-09-08 MED ORDER — PALONOSETRON HCL INJECTION 0.25 MG/5ML
0.2500 mg | Freq: Once | INTRAVENOUS | Status: AC
  Administered 2023-09-08: 0.25 mg via INTRAVENOUS
  Filled 2023-09-08: qty 5

## 2023-09-08 MED ORDER — SODIUM CHLORIDE 0.9% FLUSH
10.0000 mL | INTRAVENOUS | Status: DC | PRN
  Administered 2023-09-08: 10 mL

## 2023-09-08 MED ORDER — DEXAMETHASONE SODIUM PHOSPHATE 10 MG/ML IJ SOLN
10.0000 mg | Freq: Once | INTRAMUSCULAR | Status: AC
  Administered 2023-09-08: 10 mg via INTRAVENOUS
  Filled 2023-09-08: qty 1

## 2023-09-08 MED ORDER — SODIUM CHLORIDE 0.9 % IV SOLN: INTRAVENOUS | Status: DC

## 2023-09-08 MED ORDER — SODIUM CHLORIDE 0.9 % IV SOLN
750.0000 mg/m2 | Freq: Once | INTRAVENOUS | Status: AC
  Administered 2023-09-08: 1760 mg via INTRAVENOUS
  Filled 2023-09-08: qty 88

## 2023-09-08 MED ORDER — DOXORUBICIN HCL CHEMO IV INJECTION 2 MG/ML
50.0000 mg/m2 | Freq: Once | INTRAVENOUS | Status: AC
  Administered 2023-09-08: 118 mg via INTRAVENOUS
  Filled 2023-09-08: qty 59

## 2023-09-08 MED ORDER — SODIUM CHLORIDE 0.9% FLUSH
10.0000 mL | Freq: Once | INTRAVENOUS | Status: AC
  Administered 2023-09-08: 10 mL

## 2023-09-08 NOTE — Progress Notes (Signed)
 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  CT abdomen/pelvis showed shows a mass in the left abdomen associated with the small bowel, along with pneumoperitoneum. Underwent exploratory laparotomy and small bowel resection. 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  04/23/2023: Established care with [facility] Hematology/Oncology. 05/26/2023: Cycle 1 Day of CHOP chemotherapy 06/17/2023: Cycle 2 Day 1 of CHOP chemotherapy 07/07/2023: Cycle 3 Day 1 of CHOP chemotherapy 07/28/2023: Cycle 1 Day of CHOEP chemotherapy(added etop per [facility] request) 08/19/2023: Cycle 2 Day of CHOEP 09/08/2023: Cycle 3 Day of CHOEP  Interval History:  Elijah [name] 68 y.o. male with medical history significant for T cell lymphoma who presents for a follow up visit. The patient's last visit was on 08/19/2023. In the interim since the last visit, he completed cycle 2 of CHOEP.   On exam today Elijah [name] reports he had a rough time with his last cycle.  He reports that he is having a lot of fatigue and his energy is currently about 5 out of 10.  He is not having any nausea, vomiting, or diarrhea but is having some lightheadedness and shortness of breath on exertion.  Reports he does have a little bit of dry cough as well.  He reports his appetite has been "okay".  He was not hungry last week but has been trying to force himself to eat.  He notes he is also having a lot of muscle aches as well as joint pain, particularly after the G-CSF shot.  He notes that his neuropathy is  a little bit worse in his hands and feet but is mostly numbness with no pain.  He reports he is a little bit shaky and has "the jitters and tremors".  He notes his heart rate has been elevated on occasion as well as his blood pressure.  Otherwise he denies any fevers, chills, sweats, nausea, vomiting or diarrhea.  A full 10 point ROS is otherwise negative.  He is willing and able to proceed with treatment today.  MEDICAL HISTORY:  Past Medical History:  Diagnosis Date   Acid reflux    Allergic rhinitis    Asthma    Bloating    BPH (benign prostatic hyperplasia)    Depression    ED (erectile dysfunction)    Epigastric pain    Exercise-induced asthma    GERD (gastroesophageal reflux disease)    History of kidney stones    Hypercholesteremia    Migraine 2009   Polymyalgia rheumatica (HCC)    Prediabetes    started on metformin  during january hospitalization   Prostate disease    Sleep apnea     SURGICAL HISTORY: Past Surgical History:  Procedure Laterality Date   ANKLE SURGERY Right    BOWEL RESECTION N/A 04/10/2023   Procedure: SMALL BOWEL RESECTION WITH MASS;  Surgeon: Elijah [name], Elijah;  Location: [facility] OR;  Service: General;  Laterality: N/A;   CHEST TUBE INSERTION Left    COLONOSCOPY     CYSTOSCOPY WITH RETROGRADE PYELOGRAM, URETEROSCOPY AND STENT PLACEMENT Left 06/29/2020   Procedure: CYSTOSCOPY WITH RETROGRADE PYELOGRAM, URETEROSCOPY , BASKET EXTRACTION, AND STENT PLACEMENT;  Surgeon: Elijah [name], Elijah;  Location: [facility] ORS;  Service: Urology;  Laterality: Left;  1 HR   IR CV LINE INJECTION  07/25/2023   IR IMAGING GUIDED PORT INSERTION  05/13/2023   KNEE SURGERY Right    LAPAROTOMY N/A 04/10/2023   Procedure: EXPLORATORY LAPAROTOMY;  Surgeon: Elijah [name], Elijah;  Location: [facility] OR;  Service: General;  Laterality: N/A;   SKIN CANCER EXCISION     unsure   TONSILLECTOMY     VASECTOMY     WISDOM TOOTH EXTRACTION      SOCIAL HISTORY: Social History   Socioeconomic  History   Marital status: Married    Spouse name: Not on file   Number of children: Not on file   Years of education: Not on file   Highest education level: Not on file  Occupational History   Not on file  Tobacco Use   Smoking status: Never    Passive exposure: Never   Smokeless tobacco: Never  Vaping Use   Vaping status: Never Used  Substance and Sexual Activity   Alcohol use: Yes    Comment: social   Drug use: Never   Sexual activity: Not on file  Other Topics Concern   Not on file  Social History Narrative   Not on file   Social Drivers of Health   Financial Resource Strain: Not on file  Food Insecurity: No Food Insecurity (06/03/2023)   Hunger Vital Sign    Worried About Running Out of Food in the Last Year: Never true    Ran Out of Food in the Last Year: Never true  Transportation Needs: No Transportation Needs (06/03/2023)   PRAPARE - Administrator, Civil Service (Medical): No    Lack of Transportation (Non-Medical): No  Physical Activity: Not on file  Stress: Not on file  Social Connections: Socially Integrated (06/04/2023)   Social Connection and Isolation Panel [NHANES]    Frequency of Communication with Friends and Family: More than three times a week    Frequency of Social Gatherings with Friends and Family: Three times a week    Attends Religious Services: More than 4 times per year    Active Member of Clubs or Organizations: No    Attends Banker Meetings: 1 to 4 times per year    Marital Status: Married  Catering manager Violence: Not At Risk (06/03/2023)   Humiliation, Afraid, Rape, and Kick questionnaire    Fear of Current or Ex-Partner: No    Emotionally Abused: No    Physically Abused: No    Sexually Abused: No    FAMILY HISTORY: Family History  Problem Relation Age of Onset   Healthy Son    Healthy Daughter     ALLERGIES:  is allergic to singulair [montelukast], amlodipine , and crestor [rosuvastatin].  MEDICATIONS:   Current Outpatient Medications  Medication Sig Dispense Refill   Accu-Chek Softclix Lancets lancets Use daily before breakfast. 100 each 0   albuterol  (VENTOLIN  HFA) 108 (90 Base) MCG/ACT inhaler Inhale 2 puffs into the lungs as needed (max of 12 puffs per day) 6.7 g 0   alfuzosin  (UROXATRAL ) 10 MG 24 hr tablet Take 1 tablet (10 mg total) by mouth daily. 90 tablet 0   allopurinol  (ZYLOPRIM ) 300 MG tablet Take 1 tablet (300 mg total) by mouth daily. 90 tablet 1   amoxicillin -clavulanate (AUGMENTIN ) 875-125 MG tablet Take 1 tablet (875 mg total) by mouth every 12 (twelve) hours for 120 days 60 tablet 3   Blood Glucose Monitoring  Suppl (ACCU-CHEK GUIDE) w/Device KIT Use daily before breakfast. 1 kit 0   CINNAMON PO Take 1 capsule by mouth daily.     finasteride  (PROSCAR ) 5 MG tablet Take 1 tablet (5 mg total) by mouth daily. 90 tablet 3   glucose blood (ACCU-CHEK GUIDE TEST) test strip Use daily before breakfast. 50 each 0   Glucose Blood (BLOOD GLUCOSE TEST STRIPS) STRP Use to check blood sugar before breakfast as directed 100 strip 0   lidocaine -prilocaine  (EMLA ) cream Apply 1 Application topically as needed. 30 g 0   loratadine  (CLARITIN ) 10 MG tablet Take 10 mg by mouth daily.     methocarbamol  (ROBAXIN ) 500 MG tablet Take 2 tablets (1,000 mg total) by mouth every 8 (eight) hours as needed for muscle spasms. 60 tablet 1   minoxidil  (LONITEN ) 2.5 MG tablet Take 1 tablet (2.5 mg total) by mouth daily. 365 tablet 1   ondansetron  (ZOFRAN ) 8 MG tablet Take 1 tablet (8 mg total) by mouth every 8 (eight) hours as needed. 30 tablet 0   oxyCODONE  (OXY IR/ROXICODONE ) 5 MG immediate release tablet Take 1-2 tablets (5-10 mg total) by mouth every 6 (six) hours as needed for severe pain (pain score 7-10). 180 tablet 0   pantoprazole  (PROTONIX ) 40 MG tablet Take 1 tablet (40 mg total) by mouth daily 30 minutes to 1 hour before morning meal. 30 tablet 5   predniSONE  (DELTASONE ) 1 MG tablet Take 3 tablets by  mouth daily, along with a 5mg  tablet for a total of 8mg  daily for 1 month. Taper by 1mg  monthly. 90 tablet 0   predniSONE  (DELTASONE ) 20 MG tablet Take 3 tablets (60 mg total) by mouth daily with breakfast. Take for 5 days starting day one of chemotherapy 15 tablet 5   Probiotic TBEC Take 1 capsule by mouth daily.     prochlorperazine  (COMPAZINE ) 10 MG tablet Take 1 tablet (10 mg total) by mouth every 6 (six) hours as needed for nausea or vomiting. 30 tablet 0   No current facility-administered medications for this visit.   Facility-Administered Medications Ordered in Other Visits  Medication Dose Route Frequency Provider Last Rate Last Admin   0.9 %  sodium chloride  infusion   Intravenous Continuous Shonika Kolasinski T IV, Elijah   Stopped at 09/08/23 1300   sodium chloride  flush (NS) 0.9 % injection 10 mL  10 mL Intracatheter PRN Lynnlee Revels T IV, Elijah   10 mL at 09/08/23 1258    REVIEW OF SYSTEMS:   All other systems were reviewed with the patient and are negative.  PHYSICAL EXAMINATION: ECOG PERFORMANCE STATUS: 1 - Symptomatic but completely ambulatory  Vitals:   09/08/23 0851  BP: (!) 159/93  Pulse: 86  Resp: 16  Temp: 97.6 F (36.4 C)  SpO2: 96%      Filed Weights   09/08/23 0851  Weight: 240 lb 3.2 oz (109 kg)       GENERAL: Well-appearing middle-age Caucasian male, alert, no distress and comfortable SKIN: skin color, texture, turgor are normal, no rashes or significant lesions EYES: conjunctiva are pink and non-injected, sclera clear LUNGS: clear to auscultation and percussion with normal breathing effort HEART: regular rate & rhythm and no murmurs and no lower extremity edema Musculoskeletal: no cyanosis of digits and no clubbing  PSYCH: alert & oriented x 3, fluent speech NEURO: no focal motor/sensory deficits  LABORATORY DATA:  I have reviewed the data as listed    Latest Ref Rng & Units 09/08/2023  8:08 AM 08/19/2023    9:26 AM 08/05/2023   12:26 PM  CBC  WBC  4.0 - 10.5 K/uL 11.9  9.4  2.0   Hemoglobin 13.0 - 17.0 g/dL 29.5  62.1  30.8   Hematocrit 39.0 - 52.0 % 34.0  36.6  33.3   Platelets 150 - 400 K/uL 263  318  106        Latest Ref Rng & Units 09/08/2023    8:08 AM 08/19/2023    9:26 AM 08/05/2023   12:26 PM  CMP  Glucose 70 - 99 mg/dL 657  846  962   BUN 8 - 23 mg/dL 23  24  18    Creatinine 0.61 - 1.24 mg/dL 9.52  8.41  3.24   Sodium 135 - 145 mmol/L 139  141  140   Potassium 3.5 - 5.1 mmol/L 3.6  3.7  3.5   Chloride 98 - 111 mmol/L 108  110  108   CO2 22 - 32 mmol/L 23  25  25    Calcium 8.9 - 10.3 mg/dL 9.0  9.3  8.9   Total Protein 6.5 - 8.1 g/dL 6.7  6.8  6.5   Total Bilirubin 0.0 - 1.2 mg/dL 0.2  0.3  0.5   Alkaline Phos 38 - 126 U/L 83  77  62   AST 15 - 41 U/L 18  14  9    ALT 0 - 44 U/L 33  28  19     No results found for: "MPROTEIN" No results found for: "KPAFRELGTCHN", "LAMBDASER", "KAPLAMBRATIO"  RADIOGRAPHIC STUDIES: I have personally reviewed the radiological images as listed and agreed with the findings in the report. No results found.    ASSESSMENT & PLAN LOYAL HOLZHEIMER 68 y.o. male with medical history significant for T cell lymphoma who presents for a follow up visit.    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 Mesenteric margin is positive.  --Discussed MEITL is a rare subtype of peripheral T-cell lymphoma, approximately less than 5% of cases.  --Reviewed that the treatment strategy will include CHOP based chemotherapy. Reviewed frequency and common side effects.  -- baseline PET imaging complete on 05/02/2023.  --Started CHOEP on 07/28/2023. Plan for repeat PET CT scan after Cycle 3 of CHOEP.  Ideally he will only require  3 cycles of CHOEP (he already received 3 cycles of CHOP) PLAN: --Presents today prior to Cycle 3 of CHOEP --Patient developed significant nausea/vomiting after C1D1 as he didn't receive appropriate antiemetics during his infusion. --Labs from today were reviewed and expected after chemotherapy. WBC 11.9, hemoglobin 11.3, MCV 93.7, platelets 263. Creatinine and LFTs in range.  --Patient notes that his PET CT scan will be ordered at [facility] after completion of cycle 3. --Appreciate the recommendations and assistance from [facility] oncology --RTC pending the recommendations and results of the PET CT scan   #Upper respiratory congestion #Vertigo --Recommend to continue with claritin  and add nasonex and mucinex.   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    #Abdominal pain: --Improving after surgery.  --Drain removed. --Currently on oxycodone  5-10 mg q 4 hours  #Supportive Care -- chemotherapy education complete -- port placed --TTE complete, adequate for treatment.  --  zofran  8mg  q8H PRN and compazine  10mg  PO q6H for nausea -- acyclovir 400mg  PO BID for VCZ prophylaxis -- allopurinol  300mg  PO daily for TLS prophylaxis -- EMLA  cream for port   No orders of the defined types were placed in this encounter.   All questions were answered. The patient knows to call the clinic with any problems, questions or concerns.  A total of more than 30 minutes were spent on this encounter with face-to-face time and non-face-to-face time, including preparing to see the patient, ordering tests and/or medications, counseling the patient and coordination of care as outlined above.   Rogerio Clay, Elijah Department of Hematology/Oncology [facility] Phone: [phone] Pager: [phone] Email: [email or web address]    09/08/2023 5:21 PM

## 2023-09-08 NOTE — Patient Instructions (Signed)
 [facility] - A DEPT OF [facility]  Discharge Instructions: Thank you for choosing [facility] to provide your oncology and hematology care.   If you have a lab appointment with the Cancer Center, please go directly to the Cancer Center and check in at the registration area.   Wear comfortable clothing and clothing appropriate for easy access to any Portacath or PICC line.   We strive to give you quality time with your provider. You may need to reschedule your appointment if you arrive late (15 or more minutes).  Arriving late affects you and other patients whose appointments are after yours.  Also, if you miss three or more appointments without notifying the office, you may be dismissed from the clinic at the provider's discretion.      For prescription refill requests, have your pharmacy contact our office and allow 72 hours for refills to be completed.    Today you received the following chemotherapy and/or immunotherapy agents: Doxorubicin  (Adriamycin ), Vincristine , Cyclophosphamide  (Cytoxan ), and Etoposide     To help prevent nausea and vomiting after your treatment, we encourage you to take your nausea medication as directed.  BELOW ARE SYMPTOMS THAT SHOULD BE REPORTED IMMEDIATELY: *FEVER GREATER THAN 100.4 F (38 C) OR HIGHER *CHILLS OR SWEATING *NAUSEA AND VOMITING THAT IS NOT CONTROLLED WITH YOUR NAUSEA MEDICATION *UNUSUAL SHORTNESS OF BREATH *UNUSUAL BRUISING OR BLEEDING *URINARY PROBLEMS (pain or burning when urinating, or frequent urination) *BOWEL PROBLEMS (unusual diarrhea, constipation, pain near the anus) TENDERNESS IN MOUTH AND THROAT WITH OR WITHOUT PRESENCE OF ULCERS (sore throat, sores in mouth, or a toothache) UNUSUAL RASH, SWELLING OR PAIN  UNUSUAL VAGINAL DISCHARGE OR ITCHING   Items with * indicate a potential emergency and should be followed up as soon as possible or go to the Emergency Department if any problems  should occur.  Please show the CHEMOTHERAPY ALERT CARD or IMMUNOTHERAPY ALERT CARD at check-in to the Emergency Department and triage nurse.  Should you have questions after your visit or need to cancel or reschedule your appointment, please contact [facility] - A DE[facility] OF [facility]  Dept: [phone]  and follow the prompts.  Office hours are 8:00 a.m. to 4:30 p.m. Monday - Friday. Please note that voicemails left after 4:00 p.m. may not be returned until the following business day.  We are closed weekends and major holidays. You have access to a nurse at all times for urgent questions. Please call the main number to the clinic Dept: [phone] and follow the prompts.   For any non-urgent questions, you may also contact your provider using MyChart. We now offer e-Visits for anyone 47 and older to request care online for non-urgent symptoms. For details visit [email or web address].   Also download the MyChart app! Go to the app store, search "MyChart", open the app, select [place], and log in with your MyChart username and password.

## 2023-09-09 ENCOUNTER — Inpatient Hospital Stay

## 2023-09-09 VITALS — BP 157/76 | HR 91 | Temp 97.9°F | Resp 17

## 2023-09-09 DIAGNOSIS — Z5111 Encounter for antineoplastic chemotherapy: Secondary | ICD-10-CM | POA: Diagnosis not present

## 2023-09-09 DIAGNOSIS — C8449 Peripheral T-cell lymphoma, not classified, extranodal and solid organ sites: Secondary | ICD-10-CM

## 2023-09-09 MED ORDER — SODIUM CHLORIDE 0.9 % IV SOLN
100.0000 mg/m2 | Freq: Once | INTRAVENOUS | Status: AC
  Administered 2023-09-09: 234 mg via INTRAVENOUS
  Filled 2023-09-09: qty 11.7

## 2023-09-09 MED ORDER — DEXAMETHASONE SODIUM PHOSPHATE 10 MG/ML IJ SOLN
10.0000 mg | Freq: Once | INTRAMUSCULAR | Status: AC
  Administered 2023-09-09: 10 mg via INTRAVENOUS
  Filled 2023-09-09: qty 1

## 2023-09-09 MED ORDER — SODIUM CHLORIDE 0.9 % IV SOLN: INTRAVENOUS | Status: DC

## 2023-09-10 ENCOUNTER — Inpatient Hospital Stay

## 2023-09-10 VITALS — BP 154/85 | HR 79 | Temp 98.2°F | Resp 18

## 2023-09-10 DIAGNOSIS — Z5111 Encounter for antineoplastic chemotherapy: Secondary | ICD-10-CM | POA: Diagnosis not present

## 2023-09-10 DIAGNOSIS — C8449 Peripheral T-cell lymphoma, not classified, extranodal and solid organ sites: Secondary | ICD-10-CM

## 2023-09-10 MED ORDER — DEXAMETHASONE SODIUM PHOSPHATE 10 MG/ML IJ SOLN
10.0000 mg | Freq: Once | INTRAMUSCULAR | Status: AC
  Administered 2023-09-10: 10 mg via INTRAVENOUS
  Filled 2023-09-10: qty 1

## 2023-09-10 MED ORDER — SODIUM CHLORIDE 0.9 % IV SOLN: INTRAVENOUS | Status: DC

## 2023-09-10 MED ORDER — HEPARIN SOD (PORK) LOCK FLUSH 100 UNIT/ML IV SOLN
500.0000 [IU] | Freq: Once | INTRAVENOUS | Status: AC | PRN
  Administered 2023-09-10: 500 [IU]

## 2023-09-10 MED ORDER — SODIUM CHLORIDE 0.9 % IV SOLN
100.0000 mg/m2 | Freq: Once | INTRAVENOUS | Status: AC
  Administered 2023-09-10: 234 mg via INTRAVENOUS
  Filled 2023-09-10: qty 11.7

## 2023-09-10 MED ORDER — SODIUM CHLORIDE 0.9% FLUSH
10.0000 mL | INTRAVENOUS | Status: DC | PRN
Start: 2023-09-10 — End: 2023-09-10
  Administered 2023-09-10: 10 mL

## 2023-09-10 NOTE — Patient Instructions (Signed)
 [facility] - A DEPT OF [facility]  Discharge Instructions: Thank you for choosing [facility] to provide your oncology and hematology care.   If you have a lab appointment with the Cancer Center, please go directly to the Cancer Center and check in at the registration area.   Wear comfortable clothing and clothing appropriate for easy access to any Portacath or PICC line.   We strive to give you quality time with your provider. You may need to reschedule your appointment if you arrive late (15 or more minutes).  Arriving late affects you and other patients whose appointments are after yours.  Also, if you miss three or more appointments without notifying the office, you may be dismissed from the clinic at the provider's discretion.      For prescription refill requests, have your pharmacy contact our office and allow 72 hours for refills to be completed.    Today you received the following chemotherapy and/or immunotherapy agents: Etoposide      To help prevent nausea and vomiting after your treatment, we encourage you to take your nausea medication as directed.  BELOW ARE SYMPTOMS THAT SHOULD BE REPORTED IMMEDIATELY: *FEVER GREATER THAN 100.4 F (38 C) OR HIGHER *CHILLS OR SWEATING *NAUSEA AND VOMITING THAT IS NOT CONTROLLED WITH YOUR NAUSEA MEDICATION *UNUSUAL SHORTNESS OF BREATH *UNUSUAL BRUISING OR BLEEDING *URINARY PROBLEMS (pain or burning when urinating, or frequent urination) *BOWEL PROBLEMS (unusual diarrhea, constipation, pain near the anus) TENDERNESS IN MOUTH AND THROAT WITH OR WITHOUT PRESENCE OF ULCERS (sore throat, sores in mouth, or a toothache) UNUSUAL RASH, SWELLING OR PAIN  UNUSUAL VAGINAL DISCHARGE OR ITCHING   Items with * indicate a potential emergency and should be followed up as soon as possible or go to the Emergency Department if any problems should occur.  Please show the CHEMOTHERAPY ALERT CARD or IMMUNOTHERAPY  ALERT CARD at check-in to the Emergency Department and triage nurse.  Should you have questions after your visit or need to cancel or reschedule your appointment, please contact [facility] - A DEPT OF [facility]  Dept: [phone]  and follow the prompts.  Office hours are 8:00 a.m. to 4:30 p.m. Monday - Friday. Please note that voicemails left after 4:00 p.m. may not be returned until the following business day.  We are closed weekends and major holidays. You have access to a nurse at all times for urgent questions. Please call the main number to the clinic Dept: [phone] and follow the prompts.   For any non-urgent questions, you may also contact your provider using MyChart. We now offer e-Visits for anyone 10 and older to request care online for non-urgent symptoms. For details visit [email or web address].   Also download the MyChart app! Go to the app store, search "MyChart", open the app, select [place], and log in with your MyChart username and password.

## 2023-09-12 ENCOUNTER — Inpatient Hospital Stay

## 2023-09-12 VITALS — BP 154/82 | HR 79 | Resp 17

## 2023-09-12 DIAGNOSIS — Z5111 Encounter for antineoplastic chemotherapy: Secondary | ICD-10-CM | POA: Diagnosis not present

## 2023-09-12 DIAGNOSIS — C8449 Peripheral T-cell lymphoma, not classified, extranodal and solid organ sites: Secondary | ICD-10-CM

## 2023-09-12 MED ORDER — PEGFILGRASTIM-FPGK 6 MG/0.6ML ~~LOC~~ SOSY
6.0000 mg | PREFILLED_SYRINGE | Freq: Once | SUBCUTANEOUS | Status: AC
  Administered 2023-09-12: 6 mg via SUBCUTANEOUS
  Filled 2023-09-12: qty 0.6

## 2023-09-13 ENCOUNTER — Other Ambulatory Visit (HOSPITAL_COMMUNITY): Payer: Self-pay

## 2023-09-13 ENCOUNTER — Other Ambulatory Visit: Payer: Self-pay

## 2023-09-15 ENCOUNTER — Encounter: Payer: Self-pay | Admitting: Hematology and Oncology

## 2023-09-15 ENCOUNTER — Other Ambulatory Visit: Payer: Self-pay | Admitting: Hematology and Oncology

## 2023-09-15 ENCOUNTER — Other Ambulatory Visit: Payer: Self-pay

## 2023-09-15 ENCOUNTER — Other Ambulatory Visit (HOSPITAL_COMMUNITY): Payer: Self-pay

## 2023-09-15 MED ORDER — DULOXETINE HCL 30 MG PO CPEP
30.0000 mg | ORAL_CAPSULE | Freq: Every day | ORAL | 3 refills | Status: DC
  Filled 2023-09-15 (×3): qty 30, 30d supply, fill #0

## 2023-09-16 ENCOUNTER — Other Ambulatory Visit (HOSPITAL_COMMUNITY): Payer: Self-pay

## 2023-09-16 MED ORDER — ALBUTEROL SULFATE HFA 108 (90 BASE) MCG/ACT IN AERS
2.0000 | INHALATION_SPRAY | RESPIRATORY_TRACT | 0 refills | Status: DC | PRN
  Filled 2023-09-16: qty 6.7, 16d supply, fill #0

## 2023-09-18 ENCOUNTER — Other Ambulatory Visit (HOSPITAL_COMMUNITY): Payer: Self-pay

## 2023-09-19 NOTE — Progress Notes (Signed)
 [facility]  T/NK Lymphoid Malignancies Clinic  Follow Up Visit   Date of Follow Up: 09/24/2023 Primary oncologist: Dr. Federico at [facility] [place].   Reason for follow up: Elijah [name] is being seen in consultation at the request of self and Dr. 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   Oncologic Diagnosis and History:  - 04/10/2023: Patient presented to the ED for initial episode of acute/severe abdominal pain with nausea, vomiting, and abdominal bloating. CT abdomen/pelvis shows a mass in the left abdomen associated within the small bowel and pneumoperitoneum. Patient unerwent exploratory laparotomy with small bowel resection (17cm) and mass resection. 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 Ascites negative for malignant cells. Mesenteric margin is positive. Patient discharged with a wound vac to the abdominal incision and on augmentin .  - 05/26/2023 C1D1 - CHOP - 06/03/2023 Hospitalized locally for neutropenic fevers, with source concerning for abdominal abcess. Abdominal drain re-inserted.  - 06/17/2023 - 07/23/2023 CHOP x 3 cycles - 06/25/2023 initial visit with [facility] in the T/NK Lymphoid Malignancies Clinic - 07/22/2023 PET/CT s/p C3 CHOP Compared to postoperative baseline PET CT from May 02, 2023, favorable treatment related changes seen as described above. Significant interval contraction of the large hypoattenuating area in the left side of the abdomen with significant decrease in surrounding hypermetabolic activity. Residual 2 x 5 cm hypermetabolic activity with SUVmax 5.4 may be inflammatory (Deauville X) or residual neoplasm (Deauville 4). The hypermetabolic peripancreatic lymph node has resolved. No new hypermetabolic disease elsewhere. - 95/7974 - 09/08/2023 patient  completed additional 3 cycles of chemotherapy with CHOEP with no additional hospitalization. Cycle 6 CHOEP received on 09/08/2023 - 09/23/2023 PET/CT CR  HPI:  Elijah [name] is a 68 y.o. 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    Patient with initial presentation on 04/10/23 to the ED for acute/severe abdominal pain with nausea, vomiting, and abdominal bloating. CT Abd/Pelvis indicated a mass in the left abdomen associated within the small bowel and pueumoperitoneum. Patient underwent exploratory laparotomy with small bowel resection (17cm) and mass resection. 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  Patient discharged with a wound vac to the abdominal incision and on augmentin . Aside from abdominal symptoms, patient states that at time of initial presentation, he denied any fevers, chills, recent infections or known exposures, shortness of breath, palpitations, chest pain, drenching night sweats, unintentional weight loss, swelling, rash or skin lesions, or enlarging/palpable lymph nodes.   Patient established care locally with Dr. Federico, at [facility] with the plan to receive CHOP + pegfilgrastim  for the diagnosis of MEITL. Patient received C1D1 CHOP on 05/26/23. After first cycle, patient developed neutropenic fevers on 06/03/23 and was hopsitalized locally with infectious source concerning for developing abdominal abcess; at that time patient had an abdominal drain re-inserted. Patient was discharged from the hospital with augmentin  and plans to continue ongoing while receiving treatment. Patient received C2D1 CHOP + pegfilgrastim  on 06/17/23. Patient states his understanding of his current plan of care moving forward is to receive his next cycle (C3) with CHOP + pegfilgrastim  on  07/07/23 with the intentions of having a repeat interim PET/CT after C3 but before C4.   Today, patient states that he continues to do well today. Denies fever,  chills, recent infections or known exposures, drenching nightsweats, cough, shortness of breath, palpitations, chest or abdominal pain, nausea or vomiting, appetite or bowel changes, unintentional weight loss, easy bleeding or bruising, swelling, rashes or new skin lesions, as well as palpable or enlarged lymph nodes. Patient with abdominal drain in place at appointment, states that he continues to have minimal output. Continues with augmentin  course at this time  Interval History  Today the patient is in the clinic accompanied by his wife for follow up.  In the interim since last visit, the patient removed as suggested pigtail and completed additional 3 cycles of CHOEP with no significant complication but significant fatigue and some ongoing depression. Patient and wife have a lot of questions about transplant and specifically about allo vs autologous stem cell transplant.  He denies any fever, chills, unintentional weight loss, chest/abdominal pain, n/v/d, fair appetite.     Other pertinent PMH  GERD Allergic Rhinitis Asthma (exercise induced) Bloating BPH Depression Kidney Stones (history) Hyperchlesteremia Polymyalgia Rheumatica (Dx: 09/2022; prescribed prednisone , follows with Rheumatology locally) Prediabetes Sleep Apnea  Surgical History: Ankle surgery, Right Bowel Resection (small) - 04/10/23: follows with Dr. Cordella Idler (General Surgery) Cystoscopy with Retrograde Pyelogram/Ureteroscopy: follows with Ricardo Likens (Urology) Port Insertion - 05/13/23 Knee Surgery, Right Tonsillectomy Vasectomy Wisdom Tooth Extraction   ROS  Pertinent systems reviewed in HPI.  All other systems are negative.  ECOG performance status: 1 Restricted in physically strenuous activity but ambulatory and able to carry out work of a  light or sedentary nature, e.g., light house work, office work  Social history  Social History   Socioeconomic History  . Marital status: Married    Spouse name: Not on file  . Number of children: Not on file  . Years of education: Not on file  . Highest education level: Not on file  Occupational History  . Not on file  Tobacco Use  . Smoking status: Never  . Smokeless tobacco: Never  Vaping Use  . Vaping status: Never Used  Substance and Sexual Activity  . Alcohol use: Never  . Drug use: Never  . Sexual activity: Yes    Partners: Female  Other Topics Concern  . Not on file  Social History Narrative  . Not on file   Social Drivers of Health   Financial Resource Strain: Not on file  Food Insecurity: No Food Insecurity (06/03/2023)   Received from [facility]   Hunger Vital Sign   . Within the past 12 months, you worried that your food would run out before you got the money to buy more.: Never true   . Within the past 12 months, the food you bought just didn't last and you didn't have money to get more.: Never true  Transportation Needs: No Transportation Needs (06/03/2023)   Received from [facility] - Transportation   . Lack of Transportation (Medical): No   . Lack of Transportation (Non-Medical): No  Physical Activity: Not on file  Stress: Not on file  Social Connections: Socially Integrated (06/04/2023)   Received from [facility]   Social Connection and Isolation Panel   . In a typical week, how many times do you talk on the phone with family, friends, or neighbors?: More than three times a week   . How often do you get together with friends or relatives?: Three times a week   . How often do you attend church or religious services?: More than 4 times per year   .  Do you belong to any clubs or organizations such as church groups, unions, fraternal or athletic groups, or school groups?: No   . How often do you attend meetings of the clubs or organizations you  belong to?: 1 to 4 times per year   . Are you married, widowed, divorced, separated, never married, or living with a partner?: Married  Intimate Partner Violence: Not At Risk (06/03/2023)   Received from [facility]   Humiliation, Afraid, Rape, and Kick questionnaire   . Within the last year, have you been afraid of your partner or ex-partner?: No   . Within the last year, have you been humiliated or emotionally abused in other ways by your partner or ex-partner?: No   . Within the last year, have you been kicked, hit, slapped, or otherwise physically hurt by your partner or ex-partner?: No   . Within the last year, have you been raped or forced to have any kind of sexual activity by your partner or ex-partner?: No  Housing Stability: Low Risk  (06/03/2023)   Received from [facility] Stability Vital Sign   . In the last 12 months, was there a time when you were not able to pay the mortgage or rent on time?: No   . In the past 12 months, how many times have you moved where you were living?: 0   . At any time in the past 12 months, were you homeless or living in a shelter (including now)?: No    Family History  No family history on file.   Medication Outpatient Medications Prior to Visit  Medication Sig Dispense Refill  . acetaminophen  (TYLENOL ) 500 MG tablet     . albuterol  108 (90 Base) MCG/ACT inhaler Inhale 2 puffs into the lungs.    . alfuzosin  (UROXATRAL ) 10 MG 24 hr tablet Take 1 tablet by mouth daily.    . allopurinol  (ZYLOPRIM ) 300 MG tablet     . amoxicillin -clavulanate (AUGMENTIN ) 875-125 MG per tablet Take 1 tablet by mouth.    . finasteride  (PROSCAR ) 5 MG tablet Take 1 tablet by mouth daily.    . lidocaine -prilocaine  (EMLA ) cream Apply 1 Application topically as needed.    . loratadine  (CLARITIN ) 10 MG tablet Take 1 tablet by mouth daily.    . methocarbamol  (ROBAXIN ) 500 MG tablet Take 2 tablets by mouth.    . ondansetron  (ZOFRAN ) 8 MG tablet Take 1 tablet by mouth.     . oxyCODONE  (ROXICODONE ) 5 MG immediate release tablet Take 1-2 tablets by mouth.    . pantoprazole  (PROTONIX ) 40 MG delayed release tablet Take 1 tablet by mouth daily.    . predniSONE  (DELTASONE ) 1 MG tablet Take 3 tablets by mouth daily, along with a 5mg  tablet for a total of 8mg  daily for 1 month. Taper by 1mg  monthly.    . Probiotic TBEC Take 1 capsule by mouth daily.    SABRA senna-docusate (SENOKOT-S) 8.6-50 MG per tablet      No facility-administered medications prior to visit.    Objective  Vitals:   09/24/23 0914  BP: (!) 155/86  BP Location: Left arm  Patient Position: Sitting  BP Cuff Size: Adult  Pulse: 90  Resp: 17  Temp: 36.3 C (97.4 F)  TempSrc: Temporal  SpO2: 96%  Weight: (!) 107 kg (235 lb 12.8 oz)  Height: 1.854 m (6' 0.99)   (!) 107 kg (235 lb 12.8 oz) Body surface area is 2.31 meters squared.  GEN:  Adule male, well-appearing male, alert, NAD, conversant and appropriate HEENT: PERRL, EOMI, no lesions in oropharynx, MM moist, good dentition  Lymph: no cervical, supraclavicular, axillary, inguinal lymphadenopathy Cardio: RRR, no murmurs, no gallops, peripheral edema Lungs: CTA bilaterally A/P, no WRR, normal work of breathing, on room air   Abd: Soft, NT, ND, BS+, no HSM, no other masses.   Skin: No obvious rash or skin lesions identified.  Neuro:  CN II-IX grossly intact, moves all four extremities without notable focal deficits.  Mood: appropriate  Studies  Cbc with Diff WBC  Date Value Ref Range Status  09/24/2023 25.01 (H) 4.00 - 11.00 K/UL Final  07/23/2023 11.91 (H) 4.00 - 11.00 K/UL Final  06/25/2023 5.72 4.00 - 11.00 K/UL Final   RBC  Date Value Ref Range Status  09/24/2023 3.73 (L) 4.60 - 6.20 M/UL Final  07/23/2023 4.32 (L) 4.60 - 6.20 M/UL Final  06/25/2023 4.23 (L) 4.60 - 6.20 M/UL Final   Hemoglobin  Date Value Ref Range Status  09/24/2023 11.6 (L) 14.0 - 18.0 G/DL Final  95/76/7974 87.2 (L) 14.0 - 18.0 G/DL Final  96/73/7974  87.8 (L) 14.0 - 18.0 G/DL Final   Hematocrit  Date Value Ref Range Status  09/24/2023 36.2 (L) 40.0 - 52.0 % Final  07/23/2023 39.2 (L) 40.0 - 52.0 % Final  06/25/2023 37.8 (L) 40.0 - 52.0 % Final   MCV  Date Value Ref Range Status  09/24/2023 97.1 (H) 83.0 - 95.0 FL Final  07/23/2023 90.7 83.0 - 95.0 FL Final  06/25/2023 89.4 83.0 - 95.0 FL Final   MCH  Date Value Ref Range Status  09/24/2023 31.1 28.0 - 32.0 PG Final  07/23/2023 29.4 28.0 - 32.0 PG Final  06/25/2023 28.6 28.0 - 32.0 PG Final   RDW  Date Value Ref Range Status  09/24/2023 16.7 (H) 11.0 - 14.0 % Final  07/23/2023 18.9 (H) 11.0 - 14.0 % Final  06/25/2023 18.6 (H) 11.0 - 14.0 % Final   Platelets  Date Value Ref Range Status  09/24/2023 198 150 - 450 K/UL Final  07/23/2023 242 150 - 450 K/UL Final  06/25/2023 207 150 - 450 K/UL Final   Neutrophils Abs  Date Value Ref Range Status  09/24/2023 22.88 (H) 1.80 - 8.00 K/UL Final  07/23/2023 9.47 (H) 1.80 - 8.00 K/UL Final  06/25/2023 1.99 1.80 - 8.00 K/UL Final   Neutrophils, Absolute Calculated  Date Value Ref Range Status  09/24/2023   Final    Comment:    See Manual Differential  07/23/2023   Final    Comment:    See Manual Differential  06/25/2023   Final    Comment:    See Manual Differential   Lymphocytes Abs  Date Value Ref Range Status  09/24/2023   Final    Comment:    See Manual Differential  09/24/2023 0.23 (L) 1.00 - 5.00 K/UL Final  07/23/2023   Final    Comment:    See Manual Differential  07/23/2023 1.01 1.00 - 5.00 K/UL Final   Monos Abs  Date Value Ref Range Status  09/24/2023   Final    Comment:    See Manual Differential  09/24/2023 0.85 0.00 - 1.00 K/UL Final  07/23/2023   Final    Comment:    See Manual Differential  07/23/2023 0.81 0.00 - 1.00 K/UL Final   Eosinophils Absolute  Date Value Ref Range Status  09/24/2023   Final    Comment:  See Manual Differential  09/24/2023 0.23 0.00 - 0.60 K/UL Final   07/23/2023   Final    Comment:    See Manual Differential  07/23/2023 0.11 0.00 - 0.60 K/UL Final   Basophils Absolute  Date Value Ref Range Status  09/24/2023   Final    Comment:    See Manual Differential  09/24/2023 0.00 0.00 - 0.20 K/UL Final  07/23/2023   Final    Comment:    See Manual Differential  07/23/2023 0.20 0.00 - 0.20 K/UL Final   Nucleated RBC Percent  Date Value Ref Range Status  09/24/2023 0 % Final  07/23/2023 0 % Final  06/25/2023 0 % Final   Comprehensive Metabolic Panel   Sodium  Date Value Ref Range Status  09/24/2023 142 136 - 145 mmol/L Final  07/23/2023 142 136 - 145 mmol/L Final   Potassium  Date Value Ref Range Status  09/24/2023 3.8 3.4 - 4.8 mmol/L Final  07/23/2023 3.8 3.4 - 4.8 mmol/L Final   Chloride  Date Value Ref Range Status  09/24/2023 109 (H) 98 - 107 MMOL/L Final  07/23/2023 111 (H) 98 - 107 MMOL/L Final   CO2  Date Value Ref Range Status  09/24/2023 24 23 - 31 MMOL/L Final  07/23/2023 22 (L) 23 - 31 MMOL/L Final   Bun  Date Value Ref Range Status  09/24/2023 16 8 - 26 mg/dL Final  95/76/7974 20 8 - 26 mg/dL Final   Creatinine  Date Value Ref Range Status  09/24/2023 1.1 0.7 - 1.3 mg/dL Final  95/76/7974 1.1 0.7 - 1.3 mg/dL Final   Glucose  Date Value Ref Range Status  09/24/2023 155 (H) 74 - 99 mg/dL Final  95/76/7974 805 (H) 74 - 99 mg/dL Final   Calcium  Date Value Ref Range Status  09/24/2023 9.1 8.5 - 10.5 mg/dL Final  95/76/7974 9.5 8.5 - 10.5 mg/dL Final   Total Protein  Date Value Ref Range Status  09/24/2023 7.2 5.8 - 8.1 g/dL Final  95/76/7974 7.4 5.8 - 8.1 g/dL Final   Albumin   Date Value Ref Range Status  09/24/2023 4.4 3.2 - 4.6 g/dL Final  95/76/7974 4.2 3.2 - 4.6 g/dL Final   Total Bilirubin  Date Value Ref Range Status  09/24/2023 0.2 (L) 0.3 - 1.2 mg/dL Final  95/76/7974 0.2 (L) 0.3 - 1.2 mg/dL Final   Alk Phos  Date Value Ref Range Status  09/24/2023 130 40 - 150 U/L Final   07/23/2023 95 40 - 150 U/L Final   Ast  Date Value Ref Range Status  09/24/2023 20 <35 U/L Final  07/23/2023 19 <35 U/L Final   Alt  Date Value Ref Range Status  09/24/2023 36 <55 u/L Final  07/23/2023 31 <55 u/L Final   Anion Gap  Date Value Ref Range Status  09/24/2023 9 5 - 15 mmol/L Final  07/23/2023 9 5 - 15 mmol/L Final   est GFRcr  Date Value Ref Range Status  09/24/2023 74 >=60 mL/min/1.41m2 Final  07/23/2023 74 >=60 mL/min/1.36m2 Final      Radiology: I personally reviewed the images and [facility] at initial visit today (06/25/2023).  NM OSH 05/02/2023 (Interpreted by [facility]): 1.  Centrally necrotic collection at the left hemiabdomen with hypermetabolic, peripheral solid components (SUV Max 25) is consistent with the biopsy-proven lymphoma. Internal locules of gas are likely due to prior communication with bowel and/or due to drain manipulation. Continued fistulization with bowel is felt  less likely given lack of internal oral contrast on more recent CT imaging.  2.  Hypermetabolic solid nodule anterior to the collection is consistent with lymphomatous disease (SUV Max 12). No supradiaphragmatic sites of disease noted.  3.  Hypermetabolic soft tissue lesion at the pancreatic head is favored additional site of lymphoma, less likely prior pancreatic neoplasm (SUV Max 12).  4.  Diffuse, mild colonic wall thickening and pericolonic inflammatory changes with associated uptake are suggestive of a nonspecific colitis       Pathology: Biopsy from [facility] was reviewed on 06/25/23 SMALL BOWEL ([id number]; [place], [place], [place]; 04/10/2023):   A. SMALL BOWEL AND MASS RESECTION: MONOMORPHIC EPITHELIOTROPIC INTESTINAL T-CELL LYMPHOMA.  THIRTEEN REACTIVE LYMPH NODES; MORPHOLOGICALLY NEGATIVE FOR INVOLVEMENT BY T-CELL LYMPHOMA (0/13).  (See comment.)   B. MESENTERIC MARGIN, EXCISION: INVOLVED BY T-CELL  LYMPHOMA.  (See comment.)  Comment:  Part A and B. Histologic examination reveals a portion of small intestine with areas of transmural effacement and replacement of the bowel wall by a mass-like proliferation of medium to large-sized atypical lymphoid cells with irregular nuclei, vesicular chromatin, visible nucleoli, and moderate amounts of clear cytoplasm. Extensive mucosal ulceration and serosal caking are noted, along with evidence of bowel perforation (also noted grossly, per report). Mitotic figures are readily identified. Background inflammation is relatively sparse. Rare/focal epitheliotropism is appreciated, although histologic features suggestive of celiac disease/enteropathy are not identified within the submitted sections. Of note, there are thirteen morphologically uninvolved lymph nodes provided with Part A.    Immunohistochemical and in-situ hybridization (ISH) studies performed by the referring institution on Part A show that the atypical lymphocytes are CD3-positive T cells that coexpress CD8, CD43 and BCL2, but are negative for CD4, CD5, CD10, CD20, CD30, CD34, CD45RO, ALK, BCL6, MUM1, cyclin D1, MPO, TdT and EBER-ISH. The Ki67 proliferative index is approximately 70% within the best preserved areas. B cells, highlighted by CD20 and PAX5, are virtually absent in the involved portions of the specimen. A CD56 immunostain (reported as negative) is not provided for review.   Additional immunohistochemical studies performed at [facility] show that the lesional T cells are positive for TCRdelta (significant subset), CD7 (strong, diffuse), CD56, perforin and granzyme B, but negative for TCRBF1, CD2, CD25 and TIA-1.   In summary, the findings are consistent with involvement by a clinically aggressive, intestinal T-cell lymphoma with a cytotoxic TCRdelta+/CD8+/CD5-/CD56+/EBV- phenotype, relatively homogeneous cytomorphology, rare/focal epitheliotropism and no evidence of underlying enteropathy in the  adjacent uninvolved mucosa. 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 Correlation with clinical, radiological and other laboratory data is advised.   07/22/2023 PET/CT IMPRESSION: Compared to postoperative baseline PET CT from May 02, 2023, favorable treatment related changes seen as described above. Significant interval contraction of the large hypoattenuating area in the left side of the abdomen with significant decrease in surrounding hypermetabolic activity. Residual 2 x 5 cm hypermetabolic activity with SUVmax 5.4 may be inflammatory (Deauville X) or residual neoplasm (Deauville 4). The hypermetabolic peripancreatic lymph node has resolved. No new hypermetabolic disease elsewhere.  09/23/2023 EOT PET/CT IMPRESSION: Interval removal of the pigtail drainage catheter from the left hemiabdomen with resolution of the associated hypoattenuating, hypermetabolic lesion. No new hypermetabolic disease was detected elsewhere. (Deauville 1)   Assessment and Plan  Elijah [name] is a 68 y.o. 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   Monomorphic Epitheliotropic Intestinal T-cell Lymphoma (MEITL)  - Patient presented to the ED (04/10/2023) for initial episode of acute/severe abdominal pain with nausea, vomiting, and abdominal bloating. CT abdomen/pelvis shows a mass in the left abdomen associated within the small bowel and pneumoperitoneum. Patient unerwent exploratory laparotomy with small bowel resection (17cm) and mass resection. 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 - Initial presentation with GI/abdominal symptoms:  acute/severe abdominal pain, bloating, nausea, and vomiting.  - Established care locally with Dr. Federico at [facility] [place], [place]) for diagnosis/treatment/management of MEITL, and started CHOP s/p C3  - 07/22/2023 PET/CT  Compared to postoperative baseline PET CT from May 02, 2023, favorable treatment related changes seen as described above. Significant interval contraction of the large hypoattenuating area in the left side of the abdomen with significant decrease in surrounding hypermetabolic activity. Residual 2 x 5 cm hypermetabolic activity with SUVmax 5.4 may be inflammatory (Deauville X) or residual neoplasm (Deauville 4). The hypermetabolic peripancreatic lymph node has resolved. No new hypermetabolic disease elsewhere. - 95/76/7974 case discussed and imaging review at tumor board - agreed that this appear to be a good response of disease and recommendation for continuation of chemotherapy with the addition of etoposide  - patient switched to CHOEP and completed C6 CHOEP on 09/08/2023 - s/p pigtail removal - discussed with patient result of PET/CT showing CR; explained to the patient that we do recommend BMBx to fully restage the disease despite the lack of marrow at baseline - discussed that patient can stop augmentin  and allopurinol   - appreciated transplant team initial evaluation; patient will follow up with transplant in 1 week - extensively discuss the lack of strong evidence on transplant as consolidation strategy in first remission in PTCL and in rare subtypes as MEITL - discussed the high risk characteristic of MEITL but also he is prompt and quick response to treatment, possibly patient in CR since C3 of CHOP, suggesting strong chemo-sensitive disease and the comparative risk of autologous vs allogeneic SCT - plan for repeated BMBx and discussion collegially about consolidation with ASCT vs allo  RTC pending discussion  Denny Allis, MD, PhD Assistant Professor of  Medicine, Microbiology, Immunology and Cancer Biology Co-Director Program for T-Cell Lymphoma Research Division Hematology-Oncology Department of Medicine [facility] 

## 2023-09-20 ENCOUNTER — Other Ambulatory Visit (HOSPITAL_COMMUNITY): Payer: Self-pay

## 2023-09-29 ENCOUNTER — Encounter: Payer: Self-pay | Admitting: Hematology and Oncology

## 2023-09-30 NOTE — Telephone Encounter (Signed)
  Attending Physician, Provider or PCP: Cristie GUTTING Volodin  Reason for call is all 7/02 appointments need to be rescheduled please coordinate to see both Providers  Patient has fallen   They will be available at this number [phone]

## 2023-10-01 ENCOUNTER — Encounter (HOSPITAL_COMMUNITY): Payer: Self-pay

## 2023-10-01 ENCOUNTER — Emergency Department (HOSPITAL_COMMUNITY)

## 2023-10-01 ENCOUNTER — Other Ambulatory Visit: Payer: Self-pay

## 2023-10-01 ENCOUNTER — Inpatient Hospital Stay (HOSPITAL_COMMUNITY)
Admission: EM | Admit: 2023-10-01 | Discharge: 2023-10-03 | DRG: 025 | Disposition: A | Discharge status: Home Health | Attending: Neurological Surgery | Admitting: Neurological Surgery

## 2023-10-01 ENCOUNTER — Inpatient Hospital Stay (HOSPITAL_COMMUNITY)

## 2023-10-01 DIAGNOSIS — R296 Repeated falls: Secondary | ICD-10-CM | POA: Diagnosis present

## 2023-10-01 DIAGNOSIS — Z9221 Personal history of antineoplastic chemotherapy: Secondary | ICD-10-CM | POA: Diagnosis not present

## 2023-10-01 DIAGNOSIS — G9389 Other specified disorders of brain: Secondary | ICD-10-CM | POA: Diagnosis not present

## 2023-10-01 DIAGNOSIS — K219 Gastro-esophageal reflux disease without esophagitis: Secondary | ICD-10-CM | POA: Diagnosis present

## 2023-10-01 DIAGNOSIS — D6481 Anemia due to antineoplastic chemotherapy: Secondary | ICD-10-CM

## 2023-10-01 DIAGNOSIS — G939 Disorder of brain, unspecified: Secondary | ICD-10-CM | POA: Diagnosis not present

## 2023-10-01 DIAGNOSIS — I10 Essential (primary) hypertension: Secondary | ICD-10-CM | POA: Diagnosis present

## 2023-10-01 DIAGNOSIS — C7931 Secondary malignant neoplasm of brain: Secondary | ICD-10-CM | POA: Diagnosis present

## 2023-10-01 DIAGNOSIS — T451X5A Adverse effect of antineoplastic and immunosuppressive drugs, initial encounter: Secondary | ICD-10-CM | POA: Diagnosis not present

## 2023-10-01 DIAGNOSIS — D1771 Benign lipomatous neoplasm of kidney: Secondary | ICD-10-CM | POA: Diagnosis present

## 2023-10-01 DIAGNOSIS — E7800 Pure hypercholesterolemia, unspecified: Secondary | ICD-10-CM | POA: Diagnosis present

## 2023-10-01 DIAGNOSIS — C8339 Primary central nervous system lymphoma: Secondary | ICD-10-CM | POA: Diagnosis present

## 2023-10-01 DIAGNOSIS — R569 Unspecified convulsions: Secondary | ICD-10-CM | POA: Diagnosis not present

## 2023-10-01 DIAGNOSIS — Z9181 History of falling: Secondary | ICD-10-CM | POA: Diagnosis not present

## 2023-10-01 DIAGNOSIS — N1330 Unspecified hydronephrosis: Secondary | ICD-10-CM | POA: Diagnosis present

## 2023-10-01 DIAGNOSIS — G935 Compression of brain: Secondary | ICD-10-CM | POA: Diagnosis present

## 2023-10-01 DIAGNOSIS — N281 Cyst of kidney, acquired: Secondary | ICD-10-CM | POA: Diagnosis present

## 2023-10-01 DIAGNOSIS — M353 Polymyalgia rheumatica: Secondary | ICD-10-CM | POA: Diagnosis present

## 2023-10-01 DIAGNOSIS — R9089 Other abnormal findings on diagnostic imaging of central nervous system: Secondary | ICD-10-CM | POA: Diagnosis not present

## 2023-10-01 DIAGNOSIS — Z79899 Other long term (current) drug therapy: Secondary | ICD-10-CM

## 2023-10-01 DIAGNOSIS — D630 Anemia in neoplastic disease: Secondary | ICD-10-CM | POA: Diagnosis present

## 2023-10-01 DIAGNOSIS — D72829 Elevated white blood cell count, unspecified: Secondary | ICD-10-CM | POA: Diagnosis present

## 2023-10-01 DIAGNOSIS — Z87442 Personal history of urinary calculi: Secondary | ICD-10-CM | POA: Diagnosis not present

## 2023-10-01 DIAGNOSIS — N400 Enlarged prostate without lower urinary tract symptoms: Secondary | ICD-10-CM | POA: Diagnosis present

## 2023-10-01 DIAGNOSIS — S37009A Unspecified injury of unspecified kidney, initial encounter: Secondary | ICD-10-CM | POA: Diagnosis not present

## 2023-10-01 DIAGNOSIS — W19XXXA Unspecified fall, initial encounter: Secondary | ICD-10-CM | POA: Diagnosis present

## 2023-10-01 DIAGNOSIS — J45909 Unspecified asthma, uncomplicated: Secondary | ICD-10-CM | POA: Diagnosis present

## 2023-10-01 DIAGNOSIS — Z6831 Body mass index (BMI) 31.0-31.9, adult: Secondary | ICD-10-CM | POA: Diagnosis not present

## 2023-10-01 DIAGNOSIS — G4733 Obstructive sleep apnea (adult) (pediatric): Secondary | ICD-10-CM | POA: Diagnosis present

## 2023-10-01 DIAGNOSIS — R5383 Other fatigue: Secondary | ICD-10-CM | POA: Diagnosis present

## 2023-10-01 DIAGNOSIS — F32A Depression, unspecified: Secondary | ICD-10-CM | POA: Diagnosis present

## 2023-10-01 DIAGNOSIS — R319 Hematuria, unspecified: Secondary | ICD-10-CM | POA: Diagnosis present

## 2023-10-01 DIAGNOSIS — Z85828 Personal history of other malignant neoplasm of skin: Secondary | ICD-10-CM | POA: Diagnosis not present

## 2023-10-01 DIAGNOSIS — N529 Male erectile dysfunction, unspecified: Secondary | ICD-10-CM | POA: Diagnosis present

## 2023-10-01 DIAGNOSIS — T380X5A Adverse effect of glucocorticoids and synthetic analogues, initial encounter: Secondary | ICD-10-CM | POA: Diagnosis present

## 2023-10-01 DIAGNOSIS — Z8572 Personal history of non-Hodgkin lymphomas: Secondary | ICD-10-CM | POA: Diagnosis not present

## 2023-10-01 DIAGNOSIS — R451 Restlessness and agitation: Secondary | ICD-10-CM | POA: Diagnosis not present

## 2023-10-01 DIAGNOSIS — G4730 Sleep apnea, unspecified: Secondary | ICD-10-CM | POA: Diagnosis not present

## 2023-10-01 DIAGNOSIS — Z888 Allergy status to other drugs, medicaments and biological substances status: Secondary | ICD-10-CM

## 2023-10-01 DIAGNOSIS — R7303 Prediabetes: Secondary | ICD-10-CM | POA: Diagnosis present

## 2023-10-01 DIAGNOSIS — G839 Paralytic syndrome, unspecified: Secondary | ICD-10-CM | POA: Diagnosis present

## 2023-10-01 DIAGNOSIS — R531 Weakness: Secondary | ICD-10-CM | POA: Diagnosis present

## 2023-10-01 DIAGNOSIS — E119 Type 2 diabetes mellitus without complications: Secondary | ICD-10-CM | POA: Diagnosis not present

## 2023-10-01 DIAGNOSIS — S37012A Minor contusion of left kidney, initial encounter: Secondary | ICD-10-CM | POA: Diagnosis present

## 2023-10-01 DIAGNOSIS — Z7952 Long term (current) use of systemic steroids: Secondary | ICD-10-CM

## 2023-10-01 LAB — CBC WITH DIFFERENTIAL/PLATELET
Abs Immature Granulocytes: 0.15 10*3/uL — ABNORMAL HIGH (ref 0.00–0.07)
Basophils Absolute: 0.1 10*3/uL (ref 0.0–0.1)
Basophils Relative: 1 %
Eosinophils Absolute: 0.2 10*3/uL (ref 0.0–0.5)
Eosinophils Relative: 1 %
HCT: 32 % — ABNORMAL LOW (ref 39.0–52.0)
Hemoglobin: 10.3 g/dL — ABNORMAL LOW (ref 13.0–17.0)
Immature Granulocytes: 1 %
Lymphocytes Relative: 4 %
Lymphs Abs: 0.8 10*3/uL (ref 0.7–4.0)
MCH: 31.5 pg (ref 26.0–34.0)
MCHC: 32.2 g/dL (ref 30.0–36.0)
MCV: 97.9 fL (ref 80.0–100.0)
Monocytes Absolute: 2.8 10*3/uL — ABNORMAL HIGH (ref 0.1–1.0)
Monocytes Relative: 15 %
Neutro Abs: 15 10*3/uL — ABNORMAL HIGH (ref 1.7–7.7)
Neutrophils Relative %: 78 %
Platelets: 292 10*3/uL (ref 150–400)
RBC: 3.27 MIL/uL — ABNORMAL LOW (ref 4.22–5.81)
RDW: 16.5 % — ABNORMAL HIGH (ref 11.5–15.5)
WBC: 19 10*3/uL — ABNORMAL HIGH (ref 4.0–10.5)
nRBC: 0 % (ref 0.0–0.2)

## 2023-10-01 LAB — URINALYSIS, ROUTINE W REFLEX MICROSCOPIC
Bacteria, UA: NONE SEEN
Bilirubin Urine: NEGATIVE
Glucose, UA: NEGATIVE mg/dL
Ketones, ur: NEGATIVE mg/dL
Leukocytes,Ua: NEGATIVE
Nitrite: NEGATIVE
Protein, ur: NEGATIVE mg/dL
Specific Gravity, Urine: 1.033 — ABNORMAL HIGH (ref 1.005–1.030)
pH: 5 (ref 5.0–8.0)

## 2023-10-01 LAB — COMPREHENSIVE METABOLIC PANEL WITH GFR
ALT: 25 U/L (ref 0–44)
AST: 16 U/L (ref 15–41)
Albumin: 3.4 g/dL — ABNORMAL LOW (ref 3.5–5.0)
Alkaline Phosphatase: 61 U/L (ref 38–126)
Anion gap: 9 (ref 5–15)
BUN: 16 mg/dL (ref 8–23)
CO2: 24 mmol/L (ref 22–32)
Calcium: 9.1 mg/dL (ref 8.9–10.3)
Chloride: 108 mmol/L (ref 98–111)
Creatinine, Ser: 1.07 mg/dL (ref 0.61–1.24)
GFR, Estimated: >60 mL/min
Glucose, Bld: 141 mg/dL — ABNORMAL HIGH (ref 70–99)
Potassium: 3.5 mmol/L (ref 3.5–5.1)
Sodium: 141 mmol/L (ref 135–145)
Total Bilirubin: 0.6 mg/dL (ref 0.0–1.2)
Total Protein: 6.4 g/dL — ABNORMAL LOW (ref 6.5–8.1)

## 2023-10-01 LAB — CK: Total CK: 101 U/L (ref 49–397)

## 2023-10-01 LAB — MAGNESIUM: Magnesium: 1.8 mg/dL (ref 1.7–2.4)

## 2023-10-01 MED ORDER — PROCHLORPERAZINE EDISYLATE 10 MG/2ML IJ SOLN
10.0000 mg | Freq: Four times a day (QID) | INTRAMUSCULAR | Status: DC | PRN
  Administered 2023-10-01: 10 mg via INTRAVENOUS
  Filled 2023-10-01: qty 2

## 2023-10-01 MED ORDER — ONDANSETRON HCL 4 MG/2ML IJ SOLN
4.0000 mg | Freq: Once | INTRAMUSCULAR | Status: AC
  Administered 2023-10-01: 4 mg via INTRAVENOUS
  Filled 2023-10-01: qty 2

## 2023-10-01 MED ORDER — POLYETHYLENE GLYCOL 3350 17 G PO PACK
17.0000 g | PACK | Freq: Every day | ORAL | Status: DC
  Administered 2023-10-01: 17 g via ORAL
  Filled 2023-10-01 (×2): qty 1

## 2023-10-01 MED ORDER — MORPHINE SULFATE (PF) 4 MG/ML IV SOLN: 4.0000 mg | Freq: Once | INTRAVENOUS | Status: DC

## 2023-10-01 MED ORDER — ALFUZOSIN HCL ER 10 MG PO TB24
10.0000 mg | ORAL_TABLET | Freq: Every day | ORAL | Status: DC
  Administered 2023-10-01 – 2023-10-03 (×3): 10 mg via ORAL
  Filled 2023-10-01 (×4): qty 1

## 2023-10-01 MED ORDER — MORPHINE SULFATE (PF) 4 MG/ML IV SOLN
4.0000 mg | Freq: Once | INTRAVENOUS | Status: AC
  Administered 2023-10-01: 4 mg via INTRAVENOUS
  Filled 2023-10-01: qty 1

## 2023-10-01 MED ORDER — ONDANSETRON HCL 4 MG/2ML IJ SOLN
4.0000 mg | Freq: Four times a day (QID) | INTRAMUSCULAR | Status: DC | PRN
Start: 2023-10-01 — End: 2023-10-03

## 2023-10-01 MED ORDER — HYDROMORPHONE HCL 1 MG/ML IJ SOLN
1.0000 mg | Freq: Once | INTRAMUSCULAR | Status: AC
  Administered 2023-10-01: 1 mg via INTRAVENOUS
  Filled 2023-10-01: qty 1

## 2023-10-01 MED ORDER — OXYCODONE HCL 5 MG PO TABS
5.0000 mg | ORAL_TABLET | Freq: Four times a day (QID) | ORAL | Status: DC | PRN
  Administered 2023-10-02: 5 mg via ORAL
  Filled 2023-10-01: qty 1

## 2023-10-01 MED ORDER — SODIUM CHLORIDE 0.9 % IV BOLUS
1000.0000 mL | Freq: Once | INTRAVENOUS | Status: AC
  Administered 2023-10-01: 1000 mL via INTRAVENOUS

## 2023-10-01 MED ORDER — PANTOPRAZOLE SODIUM 40 MG IV SOLR
40.0000 mg | Freq: Two times a day (BID) | INTRAVENOUS | Status: DC
  Administered 2023-10-01 – 2023-10-03 (×4): 40 mg via INTRAVENOUS
  Filled 2023-10-01 (×4): qty 10

## 2023-10-01 MED ORDER — FINASTERIDE 5 MG PO TABS
5.0000 mg | ORAL_TABLET | Freq: Every day | ORAL | Status: DC
  Administered 2023-10-01 – 2023-10-03 (×3): 5 mg via ORAL
  Filled 2023-10-01 (×3): qty 1

## 2023-10-01 MED ORDER — ONDANSETRON HCL 4 MG PO TABS
4.0000 mg | ORAL_TABLET | Freq: Four times a day (QID) | ORAL | Status: DC | PRN
  Administered 2023-10-02: 4 mg via ORAL
  Filled 2023-10-01: qty 1

## 2023-10-01 MED ORDER — PROCHLORPERAZINE EDISYLATE 10 MG/2ML IJ SOLN: 10.0000 mg | Freq: Four times a day (QID) | INTRAMUSCULAR | Status: DC | PRN

## 2023-10-01 MED ORDER — ACETAMINOPHEN 650 MG RE SUPP: 650.0000 mg | Freq: Four times a day (QID) | RECTAL | Status: DC | PRN

## 2023-10-01 MED ORDER — LORATADINE 10 MG PO TABS
10.0000 mg | ORAL_TABLET | Freq: Every day | ORAL | Status: DC
  Administered 2023-10-01 – 2023-10-03 (×3): 10 mg via ORAL
  Filled 2023-10-01 (×3): qty 1

## 2023-10-01 MED ORDER — DEXAMETHASONE SODIUM PHOSPHATE 10 MG/ML IJ SOLN: 10.0000 mg | Freq: Two times a day (BID) | INTRAMUSCULAR | Status: DC

## 2023-10-01 MED ORDER — HYDROMORPHONE HCL 1 MG/ML IJ SOLN
0.5000 mg | Freq: Once | INTRAMUSCULAR | Status: AC
  Administered 2023-10-01: 0.5 mg via INTRAVENOUS
  Filled 2023-10-01: qty 1

## 2023-10-01 MED ORDER — IOHEXOL 350 MG/ML SOLN
75.0000 mL | Freq: Once | INTRAVENOUS | Status: AC | PRN
  Administered 2023-10-01: 75 mL via INTRAVENOUS

## 2023-10-01 MED ORDER — ACETAMINOPHEN 325 MG PO TABS: 650.0000 mg | ORAL_TABLET | Freq: Four times a day (QID) | ORAL | Status: DC | PRN

## 2023-10-01 MED ORDER — LABETALOL HCL 5 MG/ML IV SOLN: 10.0000 mg | INTRAVENOUS | Status: DC | PRN

## 2023-10-01 MED ORDER — GADOBUTROL 1 MMOL/ML IV SOLN
10.0000 mL | Freq: Once | INTRAVENOUS | Status: AC | PRN
  Administered 2023-10-01: 10 mL via INTRAVENOUS

## 2023-10-01 MED ORDER — MORPHINE SULFATE (PF) 2 MG/ML IV SOLN
2.0000 mg | INTRAVENOUS | Status: DC | PRN
  Administered 2023-10-01: 2 mg via INTRAVENOUS
  Filled 2023-10-01: qty 1

## 2023-10-01 MED ORDER — DEXAMETHASONE SODIUM PHOSPHATE 10 MG/ML IJ SOLN
10.0000 mg | Freq: Three times a day (TID) | INTRAMUSCULAR | Status: DC
  Administered 2023-10-01 – 2023-10-03 (×5): 10 mg via INTRAVENOUS
  Filled 2023-10-01 (×5): qty 1

## 2023-10-01 MED ORDER — ALBUTEROL SULFATE (2.5 MG/3ML) 0.083% IN NEBU: 2.5000 mg | INHALATION_SOLUTION | RESPIRATORY_TRACT | Status: DC | PRN

## 2023-10-01 NOTE — ED Notes (Addendum)
 SABRA

## 2023-10-01 NOTE — ED Notes (Signed)
 Pt returns from MRI

## 2023-10-01 NOTE — ED Notes (Signed)
 Patient transported to X-ray

## 2023-10-01 NOTE — ED Triage Notes (Signed)
 PT BIB [facility] from home after onset of L sided weakness  following Jun 13th treatment for T cell lymphoma. PT denies headache or dizziness. States s/s appear in days and week following treatment, but that eventually subside, pain and c/c increasing past 3 days. Endorsing L leg pain in thigh. Aox4.  Received 4mg  zofran  in route, recently Dc'd cymbalta  on 28th.   EMS VS HR 84 BP: 202/92 Spo2: 96% RA CBG 125

## 2023-10-01 NOTE — ED Notes (Signed)
 Patient transported to MRI

## 2023-10-01 NOTE — Progress Notes (Signed)
 Hematology/Oncology Progress Note  Clinical Summary: 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  Interval History: On exam today Elijah [name] is accompanied by his wife.  She reports that over the last week or so the patient has had worsening neurological decline.  He was feeling particularly unwell after his cycle 6 of chemotherapy which they thought was secondary to toxicity from the treatment.  He was seen at [facility] last week with a PET CT scan which showed a Deauville score of 1.  This is consistent with an excellent response to therapy.  Today the patient presented to the emergency department due to progressing left sided weakness.  This was accompanied by pain in his left lower extremity which has been worsening.  He has not had any headache or dizziness.  In the emergency department he underwent a CT scan of the head which revealed cortical thickening in the posterior right frontal lobe near the vertex and left parietal lobe findings were concerning for metastatic disease versus CNS neoplasm.  This finding was confirmed with MRI with findings being most suspicious for primary CNS lymphoma.  Due to concern for these findings neurosurgery was consulted.  Today I talked with the patient and his wife.  I have voiced my concern about the findings on the MRI and how this most likely represents lymphoma to spread to the brain.  That is quite rare with T-cell lymphoma.  I discussed this with his oncologist at [facility] who recommended obtaining a biopsy in order to confirm this was indeed a recurrence of the T-cell lymphoma.  This is also unexpected given his excellent response to disease elsewhere in the body.  The patient and his wife voiced understanding of our findings and concern moving  forward.  O:  Vitals:   10/01/23 1910 10/01/23 1927  BP:  (!) 157/91  Pulse:  90  Resp:  14  Temp: 98.3 F (36.8 C)   SpO2:  99%      Latest Ref Rng & Units 10/01/2023    9:59 AM 09/08/2023    8:08 AM 08/19/2023    9:26 AM  CMP  Glucose 70 - 99 mg/dL 858  824  840   BUN 8 - 23 mg/dL 16  23  24    Creatinine 0.61 - 1.24 mg/dL 8.92  8.89  8.98   Sodium 135 - 145 mmol/L 141  139  141   Potassium 3.5 - 5.1 mmol/L 3.5  3.6  3.7   Chloride 98 - 111 mmol/L 108  108  110   CO2 22 - 32 mmol/L 24  23  25    Calcium 8.9 - 10.3 mg/dL 9.1  9.0  9.3   Total Protein 6.5 - 8.1 g/dL 6.4  6.7  6.8   Total Bilirubin 0.0 - 1.2 mg/dL 0.6  0.2  0.3   Alkaline Phos 38 - 126 U/L 61  83  77   AST 15 - 41 U/L 16  18  14    ALT 0 - 44 U/L 25  33  28       Latest Ref Rng & Units 10/01/2023    9:59 AM 09/08/2023    8:08 AM 08/19/2023    9:26 AM  CBC  WBC 4.0 - 10.5 K/uL 19.0  11.9  9.4   Hemoglobin 13.0 -  17.0 g/dL 89.6  88.6  88.2   Hematocrit 39.0 - 52.0 % 32.0  34.0  36.6   Platelets 150 - 400 K/uL 292  263  318       GENERAL: Chronically ill-appearing middle-age Caucasian male SKIN: skin color, texture, turgor are normal, no rashes or significant lesions EYES: conjunctiva are pink and non-injected, sclera clear LUNGS: clear to auscultation and percussion with normal breathing effort HEART: regular rate & rhythm and no murmurs and no lower extremity edema Musculoskeletal: no cyanosis of digits and no clubbing  PSYCH: alert & oriented x 3, fluent speech NEURO: no focal motor/sensory deficits  Assessment/Plan:  # 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 Cristie at [facility] who is currently coordinating his care and planning for transplant.  She recommended biopsy in order to confirm this is indeed  the original T-cell lymphoma having spread to the brain. -- Appreciate consult with neurosurgery for consideration of biopsy. -- If biopsy is not technically feasible we could perform lumbar puncture to assess the CSF for lymphoma cells, however that tends to be lower yield even if there is CNS involvement. -- Discussed with patient and his wife that this carries a dismal prognosis.  Palliative radiation and steroids could be considered -- Oncology service will continue to follow   Norleen IVAR Kidney, MD Department of Hematology/Oncology [facility] Phone: [phone] Pager: [phone] Email: [email or web address]

## 2023-10-01 NOTE — ED Provider Notes (Signed)
 Patient seen after prior ED provider.  Patient and patient's wife understand results of imaging and workup today.  Neurosurgery is aware of case.  Trauma surgery is aware of case.  Dr. Federico with oncology is aware of case.  Hospitalist  service us  is also aware of case.   Laurice Maude BROCKS, MD 10/01/23 [phone]

## 2023-10-01 NOTE — Consult Note (Signed)
 CC/Reason for consult: fall, left sided weakness, known t cell lymphoma  HPI: Elijah [name] is an 68 y.o. male whom is well known to our service, who has traditionally been following with Dr. Polly following a presentation in January of this year with small bowel perforation/mass related to perforated lymphoma.  Has recently been receiving chemotherapy for lymphoma, last infusion being 6/13, comanaged between local oncology and [facility].  He had issues with tremors and jerking-like movements of his left arm and leg that of been going on for a few weeks.  There are some question as to whether this could be related to Cymbalta .  He has had multiple falls over the last several days.  His most recent was on 6/30.  He did have some sustained trauma to the left flank region when he bumped into a piece of furniture on the way down that caused some bruising.  He ultimately presented to the emergency room for further evaluation.  Currently, he reports stable to improved pain in the left flank region.  He denies any pain elsewhere that is new.  He has dealt with some left thigh related pain for a couple of weeks now.  He denies any new weakness since the fall.  He has had some nausea and vomiting over the last couple of days of unclear etiology at present   Past Medical History:  Diagnosis Date   Acid reflux    Allergic rhinitis    Asthma    Bloating    BPH (benign prostatic hyperplasia)    Depression    ED (erectile dysfunction)    Epigastric pain    Exercise-induced asthma    GERD (gastroesophageal reflux disease)    History of kidney stones    Hypercholesteremia    Migraine 2009   Polymyalgia rheumatica (HCC)    Prediabetes    started on metformin  during january hospitalization   Prostate disease    Sleep apnea     Past Surgical History:  Procedure Laterality Date   ANKLE SURGERY Right    BOWEL RESECTION N/A 04/10/2023   Procedure: SMALL BOWEL RESECTION WITH MASS;  Surgeon:  Polly Cordella LABOR, MD;  Location: [facility] OR;  Service: General;  Laterality: N/A;   CHEST TUBE INSERTION Left    COLONOSCOPY     CYSTOSCOPY WITH RETROGRADE PYELOGRAM, URETEROSCOPY AND STENT PLACEMENT Left 06/29/2020   Procedure: CYSTOSCOPY WITH RETROGRADE PYELOGRAM, URETEROSCOPY , BASKET EXTRACTION, AND STENT PLACEMENT;  Surgeon: Alvaro Hummer, MD;  Location: [facility];  Service: Urology;  Laterality: Left;  1 HR   IR CV LINE INJECTION  07/25/2023   IR IMAGING GUIDED PORT INSERTION  05/13/2023   KNEE SURGERY Right    LAPAROTOMY N/A 04/10/2023   Procedure: EXPLORATORY LAPAROTOMY;  Surgeon: Polly Cordella LABOR, MD;  Location: [facility] OR;  Service: General;  Laterality: N/A;   SKIN CANCER EXCISION     unsure   TONSILLECTOMY     VASECTOMY     WISDOM TOOTH EXTRACTION      Family History  Problem Relation Age of Onset   Healthy Son    Healthy Daughter     Social:  reports that he has never smoked. He has never been exposed to tobacco smoke. He has never used smokeless tobacco. He reports current alcohol use. He reports that he does not use drugs.  Allergies:  Allergies  Allergen Reactions   Singulair [Montelukast] Hives   Amlodipine  Other (See Comments)    agitation   Crestor [Rosuvastatin] Other (  See Comments)    Muscle Aches     Medications: I have reviewed the patient's current medications.  Results for orders placed or performed during the hospital encounter of 10/01/23 (from the past 48 hours)  CBC with Differential     Status: Abnormal   Collection Time: 10/01/23  9:59 AM  Result Value Ref Range   WBC 19.0 (H) 4.0 - 10.5 K/uL   RBC 3.27 (L) 4.22 - 5.81 MIL/uL   Hemoglobin 10.3 (L) 13.0 - 17.0 g/dL   HCT 67.9 (L) 60.9 - 47.9 %   MCV 97.9 80.0 - 100.0 fL   MCH 31.5 26.0 - 34.0 pg   MCHC 32.2 30.0 - 36.0 g/dL   RDW 83.4 (H) 88.4 - 84.4 %   Platelets 292 150 - 400 K/uL   nRBC 0.0 0.0 - 0.2 %   Neutrophils Relative % 78 %   Neutro Abs 15.0 (H) 1.7 - 7.7 K/uL   Lymphocytes Relative  4 %   Lymphs Abs 0.8 0.7 - 4.0 K/uL   Monocytes Relative 15 %   Monocytes Absolute 2.8 (H) 0.1 - 1.0 K/uL   Eosinophils Relative 1 %   Eosinophils Absolute 0.2 0.0 - 0.5 K/uL   Basophils Relative 1 %   Basophils Absolute 0.1 0.0 - 0.1 K/uL   Immature Granulocytes 1 %   Abs Immature Granulocytes 0.15 (H) 0.00 - 0.07 K/uL    Comment: Performed at [facility], [address], [place], [place] [address]  Comprehensive metabolic panel     Status: Abnormal   Collection Time: 10/01/23  9:59 AM  Result Value Ref Range   Sodium 141 135 - 145 mmol/L   Potassium 3.5 3.5 - 5.1 mmol/L   Chloride 108 98 - 111 mmol/L   CO2 24 22 - 32 mmol/L   Glucose, Bld 141 (H) 70 - 99 mg/dL    Comment: Glucose reference range applies only to samples taken after fasting for at least 8 hours.   BUN 16 8 - 23 mg/dL   Creatinine, Ser 8.92 0.61 - 1.24 mg/dL   Calcium 9.1 8.9 - 89.6 mg/dL   Total Protein 6.4 (L) 6.5 - 8.1 g/dL   Albumin  3.4 (L) 3.5 - 5.0 g/dL   AST 16 15 - 41 U/L   ALT 25 0 - 44 U/L   Alkaline Phosphatase 61 38 - 126 U/L   Total Bilirubin 0.6 0.0 - 1.2 mg/dL   GFR, Estimated >39 >39 mL/min    Comment: (NOTE) Calculated using the CKD-EPI Creatinine Equation (2021)    Anion gap 9 5 - 15    Comment: Performed at [facility], [address], [place], [place] [address]  Magnesium     Status: None   Collection Time: 10/01/23  9:59 AM  Result Value Ref Range   Magnesium 1.8 1.7 - 2.4 mg/dL    Comment: Performed at [facility], [address], [place], [place] [address]  CK     Status: None   Collection Time: 10/01/23  9:59 AM  Result Value Ref Range   Total CK 101 49 - 397 U/L    Comment: Performed at [facility], [address], [place], [place] [address]  Urinalysis, Routine w reflex microscopic -Urine, Clean Catch     Status: Abnormal   Collection Time: 10/01/23 12:15 PM  Result Value Ref Range   Color, Urine YELLOW YELLOW   APPearance CLEAR CLEAR   Specific  Gravity, Urine 1.033 (H) 1.005 - 1.030   pH 5.0 5.0 - 8.0   Glucose, UA NEGATIVE NEGATIVE mg/dL   Hgb urine dipstick MODERATE (A) NEGATIVE   Bilirubin Urine NEGATIVE NEGATIVE   Ketones, ur NEGATIVE NEGATIVE mg/dL   Protein, ur NEGATIVE NEGATIVE mg/dL   Nitrite NEGATIVE NEGATIVE   Leukocytes,Ua NEGATIVE NEGATIVE   RBC / HPF 11-20 0 - 5 RBC/hpf   WBC, UA 0-5 0 - 5 WBC/hpf   Bacteria, UA NONE SEEN NONE SEEN   Squamous Epithelial / HPF 0-5 0 - 5 /HPF   Mucus PRESENT     Comment: Performed at [facility], [address], [place], [place] [address]    MR Brain W and Wo Contrast Result Date: 10/01/2023 CLINICAL DATA:  Metastatic disease evaluation brain met on ct EXAM: MRI HEAD WITHOUT AND WITH CONTRAST TECHNIQUE: Multiplanar, multiecho pulse sequences of the brain and surrounding structures were obtained without and with intravenous contrast. CONTRAST:  10mL GADAVIST GADOBUTROL 1 MMOL/ML IV SOLN COMPARISON:  CT of the head dated October 01, 2023 and MRI of the brain dated Aug 19, 2005. FINDINGS: Brain: There is an amorphous area of enhancement present within the right posterior frontal lobe, that corresponds with the area of abnormally increased density noted on the previous CT. There is also a similar area of abnormal enhancement and surrounding edema within the left parietal lobe in the region of increased density and sulcal effacement. There is also a small focal area of irregular enhancement within the left frontal centrum semiovale, which appears to be related to a developmental venous anomaly. There is increased T2 signal and sulcal effacement involving the right frontal lobe and left parietal lesions. Both lesions are mildly hyperintense on diffusion. 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 Vascular: There are normal vascular flow voids present. Skull and upper cervical spine:  Normal marrow signal. No osseous lesions. Sinuses/Orbits: Clear paranasal sinuses.  Normal orbits. Other: None. IMPRESSION: 1. There are ill-defined lesions present within the right frontal lobe and left parietal lobe, which correspond with the areas of increased density/cortical thickening noted on the previous CT. 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 Metastatic disease is considered significantly less likely. 2. 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 It was present back in 2007, but is more conspicuous on the current exam. Electronically Signed   By: Evalene Coho M.D.   On: 10/01/2023 15:26   CT ABDOMEN PELVIS W CONTRAST Result Date: 10/01/2023 CLINICAL DATA:  Abdominal pain, left-sided weakness history of T-cell lymphoma * Tracking Code: BO * EXAM: CT ABDOMEN AND PELVIS WITH CONTRAST CT LUMBAR SPINE WITH CONTRAST TECHNIQUE: Multidetector CT imaging of the abdomen and pelvis was performed using the standard protocol following bolus administration of intravenous contrast. Multidetector CT imaging of the lumbar spine was performed using the standard protocol following bolus administration of intravenous contrast. RADIATION DOSE REDUCTION: This exam was performed according to the departmental dose-optimization program which includes automated exposure control, adjustment of the mA and/or kV according to patient size and/or use of iterative reconstruction technique. CONTRAST:  75mL OMNIPAQUE  IOHEXOL  350 MG/ML SOLN COMPARISON:  07/25/2023 FINDINGS: CT ABDOMEN PELVIS FINDINGS Lower chest: No acute findings. Hepatobiliary: No solid liver abnormality is seen. Hepatic steatosis. No gallstones, gallbladder wall  thickening, or biliary dilatation. Pancreas: Unremarkable. No pancreatic ductal dilatation or surrounding inflammatory changes. Spleen:  Normal in size without significant abnormality. Adrenals/Urinary Tract: Adrenal glands are unremarkable. 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 Nonobstructive calculus of the inferior pole of the left kidney. Mild left hydronephrosis, the proximal left ureter appears to be compressed by overlying hemorrhage. No ureteral calculus or other obstruction to the ureterovesicular junction. Simple benign right renal cortical cyst, for which no further follow-up or characterization is required. Right kidney is otherwise normal, without renal calculi, solid lesion, or hydronephrosis. Bladder is unremarkable. Stomach/Bowel: Stomach is within normal limits. Appendix appears normal. No evidence of bowel wall thickening, distention, or inflammatory changes. Vascular/Lymphatic: Aortic atherosclerosis. No enlarged abdominal or pelvic lymph nodes. Reproductive: Prostatomegaly. Other: No abdominal wall hernia or abnormality. No ascites. Musculoskeletal: No acute osseous findings. CT LUMBAR SPINE FINDINGS Alignment: Normal lumbar lordosis. Vertebral bodies: Intact. No fracture or dislocation. Disc spaces: Focally moderate disc space height loss and osteophytosis of L5-S1 with otherwise minimal lumbar disc degenerative change. Mild facet degenerative change of the lower lumbar levels. Paraspinous soft tissues: Unremarkable. IMPRESSION: 1. 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 2. Mild left hydronephrosis, the proximal left ureter appears to be compressed by overlying hemorrhage. No ureteral calculus or other obstruction to the ureterovesicular junction. 3. Nonobstructive calculus of the inferior  pole of the left kidney. 4. Hepatic steatosis. 5. Prostatomegaly. 6. No fracture or dislocation of the lumbar spine. 7. Focally moderate disc degenerative change at L5-S1. Aortic Atherosclerosis (ICD10-I70.0). Electronically Signed   By: Marolyn JONETTA Jaksch M.D.   On: 10/01/2023 12:52   CT L-SPINE NO CHARGE Result Date: 10/01/2023 CLINICAL DATA:  Abdominal pain, left-sided weakness history of T-cell lymphoma * Tracking Code: BO * EXAM: CT ABDOMEN AND PELVIS WITH CONTRAST CT LUMBAR SPINE WITH CONTRAST TECHNIQUE: Multidetector CT imaging of the abdomen and pelvis was performed using the standard protocol following bolus administration of intravenous contrast. Multidetector CT imaging of the lumbar spine was performed using the standard protocol following bolus administration of intravenous contrast. RADIATION DOSE REDUCTION: This exam was performed according to the departmental dose-optimization program which includes automated exposure control, adjustment of the mA and/or kV according to patient size and/or use of iterative reconstruction technique. CONTRAST:  75mL OMNIPAQUE  IOHEXOL  350 MG/ML SOLN COMPARISON:  07/25/2023 FINDINGS: CT ABDOMEN PELVIS FINDINGS Lower chest: No acute findings. Hepatobiliary: No solid liver abnormality is seen. Hepatic steatosis. No gallstones, gallbladder wall thickening, or biliary dilatation. Pancreas: Unremarkable. No pancreatic ductal dilatation or surrounding inflammatory changes. Spleen: Normal in size without significant abnormality. Adrenals/Urinary Tract: Adrenal glands are unremarkable. 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 Nonobstructive calculus of the inferior pole of the left kidney. Mild left hydronephrosis, the proximal left ureter appears to be compressed by overlying hemorrhage. No ureteral  calculus or other obstruction to the ureterovesicular junction. Simple benign right renal cortical cyst, for which no further follow-up or characterization is required. Right kidney is otherwise normal, without renal calculi, solid lesion, or hydronephrosis. Bladder is unremarkable. Stomach/Bowel: Stomach is within normal limits. Appendix appears normal.  No evidence of bowel wall thickening, distention, or inflammatory changes. Vascular/Lymphatic: Aortic atherosclerosis. No enlarged abdominal or pelvic lymph nodes. Reproductive: Prostatomegaly. Other: No abdominal wall hernia or abnormality. No ascites. Musculoskeletal: No acute osseous findings. CT LUMBAR SPINE FINDINGS Alignment: Normal lumbar lordosis. Vertebral bodies: Intact. No fracture or dislocation. Disc spaces: Focally moderate disc space height loss and osteophytosis of L5-S1 with otherwise minimal lumbar disc degenerative change. Mild facet degenerative change of the lower lumbar levels. Paraspinous soft tissues: Unremarkable. IMPRESSION: 1. 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 2. Mild left hydronephrosis, the proximal left ureter appears to be compressed by overlying hemorrhage. No ureteral calculus or other obstruction to the ureterovesicular junction. 3. Nonobstructive calculus of the inferior pole of the left kidney. 4. Hepatic steatosis. 5. Prostatomegaly. 6. No fracture or dislocation of the lumbar spine. 7. Focally moderate disc degenerative change at L5-S1. Aortic Atherosclerosis (ICD10-I70.0). Electronically Signed   By: Marolyn JONETTA Jaksch M.D.   On: 10/01/2023 12:52   DG Femur Min 2 Views Left Result Date: 10/01/2023 CLINICAL DATA:  fall, left thigh pain EXAM: LEFT FEMUR 2 VIEWS COMPARISON:  None Available. FINDINGS: No acute fracture or dislocation. There is no evidence of arthropathy  or other focal bone abnormality. Peripheral vascular atherosclerosis. Couple of surgical clips are noted in the proximal and medial thigh. IMPRESSION: No acute fracture or dislocation. Electronically Signed   By: Rogelia Myers M.D.   On: 10/01/2023 12:27   CT Head Wo Contrast Result Date: 10/01/2023 CLINICAL DATA:  Neuro deficit, concern for stroke, left-sided weakness for 3 weeks. EXAM: CT HEAD WITHOUT CONTRAST TECHNIQUE: Contiguous axial images were obtained from the base of the skull through the vertex without intravenous contrast. RADIATION DOSE REDUCTION: This exam was performed according to the departmental dose-optimization program which includes automated exposure control, adjustment of the mA and/or kV according to patient size and/or use of iterative reconstruction technique. COMPARISON:  CT head 06/17/2007. FINDINGS: Brain: Abnormal appearance of cortex within the right frontoparietal lobes near the vertex with suggestion of cortical thickening and mild hyperattenuation. There is associated sulcal effacement and local mass effect. Possible mild vasogenic edema in the posterior right frontal lobe. No definite encephalomalacia. Additional region of masslike soft tissue and possible cortical thickening involving the posterior left parietal lobe. No acute intracranial hemorrhage. Possible remote lacunar infarcts in the right caudate head and right anterior limb of the internal capsule. Nonspecific hypoattenuation in the periventricular and subcortical Joory Gough matter favored to reflect chronic microvascular ischemic changes. Ventricles are unremarkable. No midline shift. Basilar cisterns are patent. Vascular: No hyperdense vessel or unexpected calcification. Skull: Normal. Negative for fracture or focal lesion. Sinuses/Orbits: No acute finding. Other: None. IMPRESSION: Foci of masslike soft tissue and cortical thickening involving the posterior right frontal lobe near the vertex and the left parietal lobe  with local mass effect and sulcal effacement. Findings concerning for metastatic disease versus primary CNS neoplasm. Recommend MRI head with and without contrast for further evaluation. No acute intracranial hemorrhage. 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 Electronically Signed   By: Donnice Mania M.D.   On: 10/01/2023 12:16   DG Chest 1 View Result Date: 10/01/2023 CLINICAL DATA:  Pain after fall EXAM: CHEST  1 VIEW portable semi upright COMPARISON:  X-ray 06/03/2023. FINDINGS: No consolidation, pneumothorax or effusion. Normal cardiopericardial silhouette. Right  IJ chest port with tip along the right atrium. Film is under penetrated. Overlapping cardiac leads. IMPRESSION: No acute cardiopulmonary disease.  Chest port. Electronically Signed   By: Ranell Bring M.D.   On: 10/01/2023 11:56    ROS - all of the below systems have been reviewed with the patient and positives are indicated with bold text General: chills, fever or night sweats Eyes: blurry vision or double vision ENT: epistaxis or sore throat Allergy/Immunology: itchy/watery eyes or nasal congestion Hematologic/Lymphatic: petechiae, bruising; bleeding problems, blood clots Endocrine: temperature intolerance or unexpected weight changes Breast: new or changing breast lumps or nipple discharge Resp: cough, shortness of breath, or wheezing CV: chest pain or dyspnea on exertion GI: as per HPI GU: dysuria, trouble voiding, or hematuria/blood tinged MSK: joint pain (per HPI) or joint stiffness Neuro: TIA or stroke symptoms; weakness+dizziness per HPI Derm: pruritus and skin lesion changes Psych: anxiety and depression  PE Blood pressure (!) 162/95, pulse 89, temperature 98.9 F (37.2 C), resp. rate 16, height 6' 1 (1.854 m), weight 106.6 kg, SpO2 99%. Constitutional: NAD; conversant; no deformities; petechiae on extremities  Eyes: Moist conjunctiva; no lid lag; anicteric Lungs:  Normal respiratory effort CV: RRR GI: Abd soft, Nontender/nondistended MSK: Normal range of motion of extremities; no clubbing/cyanosis Psychiatric: Appropriate affect  Results for orders placed or performed during the hospital encounter of 10/01/23 (from the past 48 hours)  CBC with Differential     Status: Abnormal   Collection Time: 10/01/23  9:59 AM  Result Value Ref Range   WBC 19.0 (H) 4.0 - 10.5 K/uL   RBC 3.27 (L) 4.22 - 5.81 MIL/uL   Hemoglobin 10.3 (L) 13.0 - 17.0 g/dL   HCT 67.9 (L) 60.9 - 47.9 %   MCV 97.9 80.0 - 100.0 fL   MCH 31.5 26.0 - 34.0 pg   MCHC 32.2 30.0 - 36.0 g/dL   RDW 83.4 (H) 88.4 - 84.4 %   Platelets 292 150 - 400 K/uL   nRBC 0.0 0.0 - 0.2 %   Neutrophils Relative % 78 %   Neutro Abs 15.0 (H) 1.7 - 7.7 K/uL   Lymphocytes Relative 4 %   Lymphs Abs 0.8 0.7 - 4.0 K/uL   Monocytes Relative 15 %   Monocytes Absolute 2.8 (H) 0.1 - 1.0 K/uL   Eosinophils Relative 1 %   Eosinophils Absolute 0.2 0.0 - 0.5 K/uL   Basophils Relative 1 %   Basophils Absolute 0.1 0.0 - 0.1 K/uL   Immature Granulocytes 1 %   Abs Immature Granulocytes 0.15 (H) 0.00 - 0.07 K/uL    Comment: Performed at [facility], [address], [place], [place] [address]  Comprehensive metabolic panel     Status: Abnormal   Collection Time: 10/01/23  9:59 AM  Result Value Ref Range   Sodium 141 135 - 145 mmol/L   Potassium 3.5 3.5 - 5.1 mmol/L   Chloride 108 98 - 111 mmol/L   CO2 24 22 - 32 mmol/L   Glucose, Bld 141 (H) 70 - 99 mg/dL    Comment: Glucose reference range applies only to samples taken after fasting for at least 8 hours.   BUN 16 8 - 23 mg/dL   Creatinine, Ser 8.92 0.61 - 1.24 mg/dL   Calcium 9.1 8.9 - 89.6 mg/dL   Total Protein 6.4 (L) 6.5 - 8.1 g/dL   Albumin  3.4 (L) 3.5 - 5.0 g/dL   AST 16 15 - 41 U/L   ALT  25 0 - 44 U/L   Alkaline Phosphatase 61 38 - 126 U/L   Total Bilirubin 0.6 0.0 - 1.2 mg/dL   GFR, Estimated >39 >39 mL/min    Comment: (NOTE) Calculated  using the CKD-EPI Creatinine Equation (2021)    Anion gap 9 5 - 15    Comment: Performed at [facility], [address] [place], [place] [address]  Magnesium     Status: None   Collection Time: 10/01/23  9:59 AM  Result Value Ref Range   Magnesium 1.8 1.7 - 2.4 mg/dL    Comment: Performed at [facility], [address], [place], [place] [address]  CK     Status: None   Collection Time: 10/01/23  9:59 AM  Result Value Ref Range   Total CK 101 49 - 397 U/L    Comment: Performed at [facility], [address] [place], [place] [address]  Urinalysis, Routine w reflex microscopic -Urine, Clean Catch     Status: Abnormal   Collection Time: 10/01/23 12:15 PM  Result Value Ref Range   Color, Urine YELLOW YELLOW   APPearance CLEAR CLEAR   Specific Gravity, Urine 1.033 (H) 1.005 - 1.030   pH 5.0 5.0 - 8.0   Glucose, UA NEGATIVE NEGATIVE mg/dL   Hgb urine dipstick MODERATE (A) NEGATIVE   Bilirubin Urine NEGATIVE NEGATIVE   Ketones, ur NEGATIVE NEGATIVE mg/dL   Protein, ur NEGATIVE NEGATIVE mg/dL   Nitrite NEGATIVE NEGATIVE   Leukocytes,Ua NEGATIVE NEGATIVE   RBC / HPF 11-20 0 - 5 RBC/hpf   WBC, UA 0-5 0 - 5 WBC/hpf   Bacteria, UA NONE SEEN NONE SEEN   Squamous Epithelial / HPF 0-5 0 - 5 /HPF   Mucus PRESENT     Comment: Performed at [facility], [address], [place], [place] [address]    MR Brain W and Wo Contrast Result Date: 10/01/2023 CLINICAL DATA:  Metastatic disease evaluation brain met on ct EXAM: MRI HEAD WITHOUT AND WITH CONTRAST TECHNIQUE: Multiplanar, multiecho pulse sequences of the brain and surrounding structures were obtained without and with intravenous contrast. CONTRAST:  10mL GADAVIST GADOBUTROL 1 MMOL/ML IV SOLN COMPARISON:  CT of the head dated October 01, 2023 and MRI of the brain dated Aug 19, 2005. FINDINGS: Brain: There is an amorphous area of enhancement present within the right posterior frontal lobe, that corresponds with the area of  abnormally increased density noted on the previous CT. There is also a similar area of abnormal enhancement and surrounding edema within the left parietal lobe in the region of increased density and sulcal effacement. There is also a small focal area of irregular enhancement within the left frontal centrum semiovale, which appears to be related to a developmental venous anomaly. There is increased T2 signal and sulcal effacement involving the right frontal lobe and left parietal lesions. Both lesions are mildly hyperintense on diffusion. 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 Vascular: There are normal vascular flow voids present. Skull and upper cervical spine: Normal marrow signal. No osseous lesions. Sinuses/Orbits: Clear paranasal sinuses.  Normal orbits. Other: None. IMPRESSION: 1. There are ill-defined lesions present within the right frontal lobe and left parietal lobe, which correspond with the areas of increased density/cortical thickening noted on the previous CT. 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 Metastatic disease is considered significantly less likely. 2. 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 It was present back in 2007, but is more conspicuous on the current exam. Electronically Signed   By: Evalene Coho M.D.   On: 10/01/2023 15:26   CT ABDOMEN PELVIS W CONTRAST Result Date: 10/01/2023 CLINICAL DATA:  Abdominal pain, left-sided weakness history of T-cell lymphoma * Tracking Code: BO * EXAM: CT ABDOMEN AND PELVIS WITH CONTRAST CT LUMBAR SPINE WITH CONTRAST TECHNIQUE: Multidetector CT imaging of the abdomen and pelvis was performed using the standard protocol following bolus administration of intravenous  contrast. Multidetector CT imaging of the lumbar spine was performed using the standard protocol following bolus administration of intravenous contrast. RADIATION DOSE REDUCTION: This exam was performed according to the departmental dose-optimization program which includes automated exposure control, adjustment of the mA and/or kV according to patient size and/or use of iterative reconstruction technique. CONTRAST:  75mL OMNIPAQUE  IOHEXOL  350 MG/ML SOLN COMPARISON:  07/25/2023 FINDINGS: CT ABDOMEN PELVIS FINDINGS Lower chest: No acute findings. Hepatobiliary: No solid liver abnormality is seen. Hepatic steatosis. No gallstones, gallbladder wall thickening, or biliary dilatation. Pancreas: Unremarkable. No pancreatic ductal dilatation or surrounding inflammatory changes. Spleen: Normal in size without significant abnormality. Adrenals/Urinary Tract: Adrenal glands are unremarkable. 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 Nonobstructive calculus of the inferior pole of the left kidney. Mild left hydronephrosis, the proximal left ureter appears to be compressed by overlying hemorrhage. No ureteral calculus or other obstruction to the ureterovesicular junction. Simple benign right renal cortical cyst, for which no further follow-up or characterization is required. Right kidney is otherwise normal, without renal calculi, solid lesion, or hydronephrosis. Bladder is unremarkable. Stomach/Bowel: Stomach is within normal limits. Appendix appears normal. No evidence of bowel wall thickening, distention, or inflammatory changes. Vascular/Lymphatic: Aortic atherosclerosis. No enlarged abdominal or pelvic lymph nodes. Reproductive: Prostatomegaly. Other: No abdominal wall hernia or abnormality. No ascites. Musculoskeletal: No acute osseous findings.  CT LUMBAR SPINE FINDINGS Alignment: Normal lumbar lordosis. Vertebral bodies: Intact. No fracture or dislocation. Disc spaces: Focally moderate disc space height loss and osteophytosis of L5-S1 with otherwise minimal lumbar disc degenerative change. Mild facet degenerative change of the lower lumbar levels. Paraspinous soft tissues: Unremarkable. IMPRESSION: 1. 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 2. Mild left hydronephrosis, the proximal left ureter appears to be compressed by overlying hemorrhage. No ureteral calculus or other obstruction to the ureterovesicular junction. 3. Nonobstructive calculus of the inferior pole of the left kidney. 4. Hepatic steatosis. 5. Prostatomegaly. 6. No fracture or dislocation of the lumbar spine. 7. Focally moderate disc degenerative change at L5-S1. Aortic Atherosclerosis (ICD10-I70.0). Electronically Signed   By: Marolyn JONETTA Jaksch M.D.   On: 10/01/2023 12:52   CT L-SPINE NO CHARGE Result Date: 10/01/2023 CLINICAL DATA:  Abdominal pain, left-sided weakness history of T-cell lymphoma * Tracking Code: BO * EXAM: CT ABDOMEN AND PELVIS WITH CONTRAST CT LUMBAR SPINE WITH CONTRAST TECHNIQUE: Multidetector CT imaging of the abdomen and pelvis was performed using the standard protocol following bolus administration of intravenous contrast. Multidetector CT imaging of the lumbar spine was performed using the standard protocol following bolus administration of  intravenous contrast. RADIATION DOSE REDUCTION: This exam was performed according to the departmental dose-optimization program which includes automated exposure control, adjustment of the mA and/or kV according to patient size and/or use of iterative reconstruction technique. CONTRAST:  75mL OMNIPAQUE  IOHEXOL  350 MG/ML SOLN COMPARISON:  07/25/2023 FINDINGS: CT ABDOMEN  PELVIS FINDINGS Lower chest: No acute findings. Hepatobiliary: No solid liver abnormality is seen. Hepatic steatosis. No gallstones, gallbladder wall thickening, or biliary dilatation. Pancreas: Unremarkable. No pancreatic ductal dilatation or surrounding inflammatory changes. Spleen: Normal in size without significant abnormality. Adrenals/Urinary Tract: Adrenal glands are unremarkable. 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 Nonobstructive calculus of the inferior pole of the left kidney. Mild left hydronephrosis, the proximal left ureter appears to be compressed by overlying hemorrhage. No ureteral calculus or other obstruction to the ureterovesicular junction. Simple benign right renal cortical cyst, for which no further follow-up or characterization is required. Right kidney is otherwise normal, without renal calculi, solid lesion, or hydronephrosis. Bladder is unremarkable. Stomach/Bowel: Stomach is within normal limits. Appendix appears normal. No evidence of bowel wall thickening, distention, or inflammatory changes. Vascular/Lymphatic: Aortic atherosclerosis. No enlarged abdominal or pelvic lymph nodes. Reproductive: Prostatomegaly. Other: No abdominal wall hernia or abnormality. No ascites. Musculoskeletal: No acute osseous findings. CT LUMBAR SPINE FINDINGS Alignment: Normal lumbar lordosis. Vertebral bodies: Intact. No fracture or dislocation. Disc spaces: Focally moderate disc space height loss and osteophytosis of L5-S1 with otherwise minimal lumbar disc degenerative change. Mild facet degenerative change of the lower lumbar levels. Paraspinous soft tissues: Unremarkable. IMPRESSION: 1. 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 2. Mild left hydronephrosis, the proximal left ureter appears to be compressed by overlying hemorrhage. No ureteral calculus or other obstruction to the ureterovesicular junction. 3. Nonobstructive calculus of the inferior pole of the left kidney. 4. Hepatic steatosis. 5. Prostatomegaly. 6. No fracture or dislocation of the lumbar spine. 7. Focally moderate disc degenerative change at L5-S1. Aortic Atherosclerosis (ICD10-I70.0). Electronically Signed   By: Marolyn JONETTA Jaksch M.D.   On: 10/01/2023 12:52   DG Femur Min 2 Views Left Result Date: 10/01/2023 CLINICAL DATA:  fall, left thigh pain EXAM: LEFT FEMUR 2 VIEWS COMPARISON:  None Available. FINDINGS: No acute fracture or dislocation. There is no evidence of arthropathy or other focal bone abnormality. Peripheral vascular atherosclerosis. Couple of surgical clips are noted in the proximal and medial thigh. IMPRESSION: No acute fracture or dislocation. Electronically Signed   By: Rogelia Myers M.D.   On: 10/01/2023 12:27   CT Head Wo Contrast Result Date: 10/01/2023 CLINICAL DATA:  Neuro deficit, concern for stroke, left-sided weakness for 3 weeks. EXAM: CT HEAD WITHOUT CONTRAST TECHNIQUE: Contiguous axial images were obtained from the base of the skull through the vertex without intravenous contrast. RADIATION DOSE REDUCTION: This exam was performed according to the departmental dose-optimization program which includes automated exposure control, adjustment of the mA and/or kV according to patient size and/or use of iterative reconstruction technique. COMPARISON:  CT head 06/17/2007. FINDINGS: Brain: Abnormal appearance of cortex within the right frontoparietal lobes near the vertex with suggestion of cortical thickening and mild hyperattenuation. There is associated sulcal effacement and local mass effect. Possible mild vasogenic edema in the posterior right frontal lobe. No definite  encephalomalacia.  Additional region of masslike soft tissue and possible cortical thickening involving the posterior left parietal lobe. No acute intracranial hemorrhage. Possible remote lacunar infarcts in the right caudate head and right anterior limb of the internal capsule. Nonspecific hypoattenuation in the periventricular and subcortical Jeri Rawlins matter favored to reflect chronic microvascular ischemic changes. Ventricles are unremarkable. No midline shift. Basilar cisterns are patent. Vascular: No hyperdense vessel or unexpected calcification. Skull: Normal. Negative for fracture or focal lesion. Sinuses/Orbits: No acute finding. Other: None. IMPRESSION: Foci of masslike soft tissue and cortical thickening involving the posterior right frontal lobe near the vertex and the left parietal lobe with local mass effect and sulcal effacement. Findings concerning for metastatic disease versus primary CNS neoplasm. Recommend MRI head with and without contrast for further evaluation. No acute intracranial hemorrhage. 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 Electronically Signed   By: Donnice Mania M.D.   On: 10/01/2023 12:16   DG Chest 1 View Result Date: 10/01/2023 CLINICAL DATA:  Pain after fall EXAM: CHEST  1 VIEW portable semi upright COMPARISON:  X-ray 06/03/2023. FINDINGS: No consolidation, pneumothorax or effusion. Normal cardiopericardial silhouette. Right IJ chest port with tip along the right atrium. Film is under penetrated. Overlapping cardiac leads. IMPRESSION: No acute cardiopulmonary disease.  Chest port. Electronically Signed   By: Ranell Bring M.D.   On: 10/01/2023 11:56   A/P: Elijah [name] is an 68 y.o. 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 Hematuria on UA expected in this setting. Discussed with Dr. 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  Lonni Pizza, MD [facility], [facility]

## 2023-10-01 NOTE — Progress Notes (Signed)
 EEG complete - results pending

## 2023-10-01 NOTE — ED Provider Notes (Addendum)
 [facility] Provider Note  CSN: [id number] Arrival date & time: 10/01/23 9061  Chief Complaint(s) Weakness  HPI [facility] [name] is a 68 y.o. male with past medical history as [name], [facility] [name], [facility] [name], [facility] [name], [facility]jah [name] 6/13, since has been having progressively worsening weakness to his left arm and left leg.  Worsening nausea and vomiting.  He typically does have some cramping and tingling some occasional weakness to his left side after his [name] but has never been as severe.  He also does usually have some left thigh pain after his treatment but again never this severe.  He has had multiple falls over the past few days secondary to left-sided weakness, dragging his left leg.  Unable to pick his left arm off the bed.  No numbness to his face or extremities.  No fevers.  No chest pain or dib.  He is having nausea and vomiting, intermittent abd pain, poor appetite.  Symptoms seem to be worsened in last 2-3 days. Multiple falls.   Past Medical History Past Medical History:  Diagnosis Date   Acid reflux    Allergic rhinitis    [name]    Bloating    [facility] [name] (benign prostatic hyperplasia)    Depression    ED (erectile dysfunction)    Epigastric pain    Exercise-induced [name]    [facility] [name] (gastroesophageal reflux disease)    History of kidney stones    Hypercholesteremia    Migraine 2009   Elijah rheumatica (HCC)    Prediabetes    started on metformin  during january hospitalization   Prostate disease    Sleep apnea    Patient Active Problem List   Diagnosis Date Noted   PMR (Elijah rheumatica) (HCC) 06/03/2023   [facility] [name] (benign prostatic hyperplasia) 06/03/2023   AKI (acute kidney injury) (HCC) 06/03/2023   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 Authorizing Provider  Accu-Chek Softclix Lancets lancets Use daily before breakfast. 04/16/23   Briana Elgin LABOR, MD  albuterol  (VENTOLIN  HFA) 108 (90 Base) MCG/ACT inhaler Inhale 2 puffs into the lungs every 4 (four) hours as needed. 09/15/23     alfuzosin  (UROXATRAL ) 10 MG 24 hr tablet Take 1 tablet (10 mg total) by mouth daily. 07/25/23     allopurinol  (ZYLOPRIM ) 300 MG tablet Take 1 tablet (300 mg total) by mouth daily. 05/19/23   [name], John T IV, MD  amoxicillin -clavulanate (AUGMENTIN ) 875-125 MG tablet Take 1 tablet (875 mg total) by mouth every 12 (twelve) hours for 120 days 06/13/23     Blood Glucose Monitoring Suppl (ACCU-CHEK GUIDE) w/Device KIT Use daily before breakfast. 04/16/23   Briana Elgin LABOR, MD  CINNAMON PO Take 1 capsule by mouth daily.    [provider]  DULoxetine  (CYMBALTA ) 30 MG capsule Take 1 capsule (30 mg total) by mouth daily. 09/15/23   Federico Norleen ONEIDA MADISON, MD  finasteride  (PROSCAR ) 5 MG tablet Take 1 tablet (5 mg total) by mouth daily. 02/11/23  glucose blood (ACCU-CHEK GUIDE TEST) test strip Use daily before breakfast. 04/16/23   Briana Elgin LABOR, MD  Glucose Blood (BLOOD GLUCOSE TEST STRIPS) STRP Use to check blood sugar before breakfast as directed 04/16/23   Briana Elgin LABOR, MD  lidocaine -prilocaine  (EMLA ) cream Apply 1 Application topically as needed. 05/19/23   Federico Norleen ONEIDA MADISON, MD  loratadine  (CLARITIN ) 10 MG tablet Take 10 mg by mouth daily.    [provider]  methocarbamol  (ROBAXIN ) 500 MG tablet Take 2 tablets (1,000 mg total) by mouth every 8 (eight) hours  as needed for muscle spasms. 06/16/23   Federico Norleen ONEIDA MADISON, MD  minoxidil  (LONITEN ) 2.5 MG tablet Take 1 tablet (2.5 mg total) by mouth daily. 12/03/22     ondansetron  (ZOFRAN ) 8 MG tablet Take 1 tablet (8 mg total) by mouth every 8 (eight) hours as needed. 06/17/23   Federico Norleen ONEIDA MADISON, MD  oxyCODONE  (OXY IR/ROXICODONE ) 5 MG immediate release tablet Take 1-2 tablets (5-10 mg total) by mouth every 6 (six) hours as needed for severe pain (pain score 7-10). 04/23/23   Federico Norleen ONEIDA MADISON, MD  pantoprazole  (PROTONIX ) 40 MG tablet Take 1 tablet (40 mg total) by mouth daily 30 minutes to 1 hour before morning meal. 05/01/23     predniSONE  (DELTASONE ) 1 MG tablet Take 3 tablets by mouth daily, along with a 5mg  tablet for a total of 8mg  daily for 1 month. Taper by 1mg  monthly. 06/16/23   Dolphus Reiter, MD  predniSONE  (DELTASONE ) 20 MG tablet Take 3 tablets (60 mg total) by mouth daily with breakfast. Take for 5 days starting day one of [name] 06/16/23   [name], John T IV, MD  Probiotic TBEC Take 1 capsule by mouth daily.    [provider]  prochlorperazine  (COMPAZINE ) 10 MG tablet Take 1 tablet (10 mg total) by mouth every 6 (six) hours as needed for nausea or vomiting. 07/20/23   Federico Norleen ONEIDA MADISON, MD                                                                                                                                    Past Surgical History Past Surgical History:  Procedure Laterality Date   ANKLE SURGERY Right    BOWEL RESECTION N/A 04/10/2023   Procedure: SMALL BOWEL RESECTION WITH MASS;  Surgeon: Polly Cordella LABOR, MD;  Location: [facility] OR;  Service: General;  Laterality: N/A;   CHEST TUBE INSERTION Left    COLONOSCOPY     CYSTOSCOPY WITH RETROGRADE PYELOGRAM, URETEROSCOPY AND STENT PLACEMENT Left 06/29/2020   Procedure: CYSTOSCOPY WITH RETROGRADE PYELOGRAM, URETEROSCOPY , BASKET EXTRACTION, AND STENT PLACEMENT;  Surgeon: Alvaro Hummer, MD;  Location: [facility] ORS;  Service: Urology;   Laterality: Left;  1 HR   IR CV LINE INJECTION  07/25/2023   IR IMAGING GUIDED PORT INSERTION  05/13/2023   KNEE SURGERY Right    LAPAROTOMY N/A 04/10/2023  Procedure: EXPLORATORY LAPAROTOMY;  Surgeon: Polly Cordella LABOR, MD;  Location[facility] OR;  Service: General;  Laterality: N/A;   SKIN CANCER EXCISION     unsure   TONSILLECTOMY     VASECTOMY     WISDOM TOOTH EXTRACTION     Family History Family History  Problem Relation Age of Onset   Healthy Son    Healthy Daughter     Social History Social History   Tobacco Use   Smoking status: Never    Passive exposure: Never   Smokeless tobacco: Never  Vaping Use   Vaping status: Never Used  Substance Use Topics   Alcohol use: Yes    Comment: social   Drug use: Never   Allergies Singulair [montelukast], Amlodipine , and Crestor [rosuvastatin]  Review of Systems A thorough review of systems was obtained and all systems are negative except as noted in the HPI and PMH.   Physical Exam Vital Signs  I have reviewed the triage vital signs BP (!) 162/95   Pulse 89   Temp 98.9 F (37.2 C)   Resp 16   Ht 6' 1 (1.854 m)   Wt 106.6 kg   SpO2 99%   BMI 31.00 kg/m  Physical Exam Vitals and nursing note reviewed.  Constitutional:      General: He is not in acute distress.    Appearance: He is well-developed. He is obese.  HENT:     Head: Normocephalic and atraumatic.     Right Ear: External ear normal.     Left Ear: External ear normal.     Mouth/Throat:     Mouth: Mucous membranes are moist.  Eyes:     General: No visual field deficit or scleral icterus.    Extraocular Movements: Extraocular movements intact.     Pupils: Pupils are equal, round, and reactive to light.  Cardiovascular:     Rate and Rhythm: Normal rate and regular rhythm.     Pulses: Normal pulses.     Heart sounds: Normal heart sounds.  Pulmonary:     Effort: Pulmonary effort is normal. No respiratory distress.     Breath sounds: Normal breath sounds.   Abdominal:     General: Abdomen is flat.     Palpations: Abdomen is soft.  Musculoskeletal:     Cervical back: No rigidity.     Right lower leg: No edema.     Left lower leg: No edema.  Skin:    General: Skin is warm and dry.     Capillary Refill: Capillary refill takes less than 2 seconds.  Neurological:     Mental Status: He is alert and oriented to person, place, and time.     GCS: GCS eye subscore is 4. GCS verbal subscore is 5. GCS motor subscore is 6.     Cranial Nerves: Cranial nerves 2-12 are intact. No dysarthria or facial asymmetry.     Sensory: Sensation is intact.     Motor: Weakness and pronator drift present.     Comments: Weakness LUE and LLE, reduced fine/dull sensation LLE Drift LUE but does not hit bed LLE no effort against gravity No clonus Coordination intact right side, unable to complete left sided 2/2 weakness Gait testing deferred secondary to patient safety.  NIHSS 5  Psychiatric:        Mood and Affect: Mood normal.        Behavior: Behavior normal.     ED Results and Treatments Labs (all labs ordered are listed,  but only abnormal results are displayed) Labs Reviewed  CBC WITH DIFFERENTIAL/PLATELET - Abnormal; Notable for the following components:      Result Value   WBC 19.0 (*)    RBC 3.27 (*)    Hemoglobin 10.3 (*)    HCT 32.0 (*)    RDW 16.5 (*)    Neutro Abs 15.0 (*)    Monocytes Absolute 2.8 (*)    Abs Immature Granulocytes 0.15 (*)    All other components within normal limits  COMPREHENSIVE METABOLIC PANEL WITH GFR - Abnormal; Notable for the following components:   Glucose, Bld 141 (*)    Total Protein 6.4 (*)    Albumin  3.4 (*)    All other components within normal limits  URINALYSIS, ROUTINE W REFLEX MICROSCOPIC - Abnormal; Notable for the following components:   Specific Gravity, Urine 1.033 (*)    Hgb urine dipstick MODERATE (*)    All other components within normal limits  MAGNESIUM  CK                                                                                                                           Radiology MR Brain W and Wo Contrast Result Date: 10/01/2023 CLINICAL DATA:  Metastatic disease evaluation brain met on ct EXAM: MRI HEAD WITHOUT AND WITH CONTRAST TECHNIQUE: Multiplanar, multiecho pulse sequences of the brain and surrounding structures were obtained without and with intravenous contrast. CONTRAST:  10mL GADAVIST GADOBUTROL 1 MMOL/ML IV SOLN COMPARISON:  CT of the head dated October 01, 2023 and MRI of the brain dated Aug 19, 2005. FINDINGS: Brain: There is an amorphous area of enhancement present within the right posterior frontal lobe, that corresponds with the area of abnormally increased density noted on the previous CT. There is also a similar area of abnormal enhancement and surrounding edema within the left parietal lobe in the region of increased density and sulcal effacement. There is also a small focal area of irregular enhancement within the left frontal centrum semiovale, which appears to be related to a developmental venous anomaly. There is increased T2 signal and sulcal effacement involving the right frontal lobe and left parietal lesions. Both lesions are mildly hyperintense on diffusion. 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 Vascular: There are normal vascular flow voids present. Skull and upper cervical spine: Normal marrow signal. No osseous lesions. Sinuses/Orbits: Clear paranasal sinuses.  Normal orbits. Other: None. IMPRESSION: 1. There are ill-defined lesions present within the right frontal lobe and left parietal lobe, which correspond with the areas of increased density/cortical thickening noted on the previous CT. 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 Metastatic disease is  considered significantly less likely. 2. 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 It was present back in  2007, but is more conspicuous on the current exam. Electronically Signed   By: Evalene Coho M.D.   On: 10/01/2023 15:26   CT ABDOMEN PELVIS W CONTRAST Result Date: 10/01/2023 CLINICAL DATA:  Abdominal pain, left-sided weakness history of T-cell lymphoma * Tracking Code: BO * EXAM: CT ABDOMEN AND PELVIS WITH CONTRAST CT LUMBAR SPINE WITH CONTRAST TECHNIQUE: Multidetector CT imaging of the abdomen and pelvis was performed using the standard protocol following bolus administration of intravenous contrast. Multidetector CT imaging of the lumbar spine was performed using the standard protocol following bolus administration of intravenous contrast. RADIATION DOSE REDUCTION: This exam was performed according to the departmental dose-optimization program which includes automated exposure control, adjustment of the mA and/or kV according to patient size and/or use of iterative reconstruction technique. CONTRAST:  75mL OMNIPAQUE  IOHEXOL  350 MG/ML SOLN COMPARISON:  07/25/2023 FINDINGS: CT ABDOMEN PELVIS FINDINGS Lower chest: No acute findings. Hepatobiliary: No solid liver abnormality is seen. Hepatic steatosis. No gallstones, gallbladder wall thickening, or biliary dilatation. Pancreas: Unremarkable. No pancreatic ductal dilatation or surrounding inflammatory changes. Spleen: Normal in size without Elijah abnormality. Adrenals/Urinary Tract: Adrenal glands are unremarkable. 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 Nonobstructive calculus of the inferior pole of the left kidney. Mild left hydronephrosis, the proximal left ureter appears to be compressed by overlying  hemorrhage. No ureteral calculus or other obstruction to the ureterovesicular junction. Simple benign right renal cortical cyst, for which no further follow-up or characterization is required. Right kidney is otherwise normal, without renal calculi, solid lesion, or hydronephrosis. Bladder is unremarkable. Stomach/Bowel: Stomach is within normal limits. Appendix appears normal. No evidence of bowel wall thickening, distention, or inflammatory changes. Vascular/Lymphatic: Aortic atherosclerosis. No enlarged abdominal or pelvic lymph nodes. Reproductive: Prostatomegaly. Other: No abdominal wall hernia or abnormality. No ascites. Musculoskeletal: No acute osseous findings. CT LUMBAR SPINE FINDINGS Alignment: Normal lumbar lordosis. Vertebral bodies: Intact. No fracture or dislocation. Disc spaces: Focally moderate disc space height loss and osteophytosis of L5-S1 with otherwise minimal lumbar disc degenerative change. Mild facet degenerative change of the lower lumbar levels. Paraspinous soft tissues: Unremarkable. IMPRESSION: 1. 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 2. Mild left hydronephrosis, the proximal left ureter appears to be compressed by overlying hemorrhage. No ureteral calculus or other obstruction to the ureterovesicular junction. 3. Nonobstructive calculus of the inferior pole of the left kidney. 4. Hepatic steatosis. 5. Prostatomegaly. 6. No fracture or dislocation of the lumbar spine. 7. Focally moderate disc degenerative change at L5-S1. Aortic Atherosclerosis (ICD10-I70.0). Electronically Signed   By: Marolyn JONETTA Jaksch M.D.   On: 10/01/2023 12:52   CT L-SPINE NO CHARGE Result Date: 10/01/2023 CLINICAL DATA:  Abdominal pain, left-sided weakness history of T-cell lymphoma * Tracking Code: BO * EXAM: CT ABDOMEN AND PELVIS WITH CONTRAST CT LUMBAR  SPINE WITH CONTRAST TECHNIQUE: Multidetector CT imaging of the abdomen and pelvis was performed using the standard protocol following bolus administration of intravenous contrast. Multidetector CT imaging of the lumbar spine was performed using the standard protocol following bolus administration of intravenous contrast. RADIATION DOSE REDUCTION: This exam was performed according to the departmental dose-optimization program which includes automated exposure control, adjustment of the mA and/or kV according to patient size and/or use of iterative  reconstruction technique. CONTRAST:  75mL OMNIPAQUE  IOHEXOL  350 MG/ML SOLN COMPARISON:  07/25/2023 FINDINGS: CT ABDOMEN PELVIS FINDINGS Lower chest: No acute findings. Hepatobiliary: No solid liver abnormality is seen. Hepatic steatosis. No gallstones, gallbladder wall thickening, or biliary dilatation. Pancreas: Unremarkable. No pancreatic ductal dilatation or surrounding inflammatory changes. Spleen: Normal in size without Elijah abnormality. Adrenals/Urinary Tract: Adrenal glands are unremarkable. 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 Nonobstructive calculus of the inferior pole of the left kidney. Mild left hydronephrosis, the proximal left ureter appears to be compressed by overlying hemorrhage. No ureteral calculus or other obstruction to the ureterovesicular junction. Simple benign right renal cortical cyst, for which no further follow-up or characterization is required. Right kidney is otherwise normal, without renal calculi, solid lesion, or hydronephrosis. Bladder is unremarkable. Stomach/Bowel: Stomach is within normal limits. Appendix appears normal. No evidence of bowel wall thickening, distention, or inflammatory changes. Vascular/Lymphatic: Aortic atherosclerosis. No  enlarged abdominal or pelvic lymph nodes. Reproductive: Prostatomegaly. Other: No abdominal wall hernia or abnormality. No ascites. Musculoskeletal: No acute osseous findings. CT LUMBAR SPINE FINDINGS Alignment: Normal lumbar lordosis. Vertebral bodies: Intact. No fracture or dislocation. Disc spaces: Focally moderate disc space height loss and osteophytosis of L5-S1 with otherwise minimal lumbar disc degenerative change. Mild facet degenerative change of the lower lumbar levels. Paraspinous soft tissues: Unremarkable. IMPRESSION: 1. 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 2. Mild left hydronephrosis, the proximal left ureter appears to be compressed by overlying hemorrhage. No ureteral calculus or other obstruction to the ureterovesicular junction. 3. Nonobstructive calculus of the inferior pole of the left kidney. 4. Hepatic steatosis. 5. Prostatomegaly. 6. No fracture or dislocation of the lumbar spine. 7. Focally moderate disc degenerative change at L5-S1. Aortic Atherosclerosis (ICD10-I70.0). Electronically Signed   By: Marolyn JONETTA Jaksch M.D.   On: 10/01/2023 12:52   DG Femur Min 2 Views Left Result Date: 10/01/2023 CLINICAL DATA:  fall, left thigh pain EXAM: LEFT FEMUR 2 VIEWS COMPARISON:  None Available. FINDINGS: No acute fracture or dislocation. There is no evidence of arthropathy or other focal bone abnormality. Peripheral vascular atherosclerosis. Couple of surgical clips are noted in the proximal and medial thigh. IMPRESSION: No acute fracture or dislocation. Electronically Signed   By: Rogelia Myers M.D.   On: 10/01/2023 12:27   CT Head Wo Contrast Result Date: 10/01/2023 CLINICAL DATA:  Neuro deficit, concern for stroke, left-sided weakness for 3 weeks. EXAM: CT HEAD WITHOUT CONTRAST TECHNIQUE: Contiguous axial images were obtained from the  base of the skull through the vertex without intravenous contrast. RADIATION DOSE REDUCTION: This exam was performed according to the departmental dose-optimization program which includes automated exposure control, adjustment of the mA and/or kV according to patient size and/or use of iterative reconstruction technique. COMPARISON:  CT head 06/17/2007. FINDINGS: Brain: Abnormal appearance of cortex within the right frontoparietal lobes near the vertex with suggestion of cortical thickening and mild hyperattenuation. There is associated sulcal effacement and local mass effect. Possible mild vasogenic edema in the posterior right frontal lobe. No definite encephalomalacia. Additional region of masslike soft tissue and possible cortical thickening involving the posterior left parietal lobe. No acute intracranial hemorrhage. Possible remote lacunar infarcts in the right caudate head and right anterior limb of the  internal capsule. Nonspecific hypoattenuation in the periventricular and subcortical white matter favored to reflect chronic microvascular ischemic changes. Ventricles are unremarkable. No midline shift. Basilar cisterns are patent. Vascular: No hyperdense vessel or unexpected calcification. Skull: Normal. Negative for fracture or focal lesion. Sinuses/Orbits: No acute finding. Other: None. IMPRESSION: Foci of masslike soft tissue and cortical thickening involving the posterior right frontal lobe near the vertex and the left parietal lobe with local mass effect and sulcal effacement. Findings concerning for metastatic disease versus primary CNS neoplasm. Recommend MRI head with and without contrast for further evaluation. No acute intracranial hemorrhage. 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 Electronically Signed   By: Donnice Mania M.D.   On: 10/01/2023 12:16   DG Chest 1 View Result Date: 10/01/2023 CLINICAL DATA:  Pain after fall EXAM: CHEST  1 VIEW  portable semi upright COMPARISON:  X-ray 06/03/2023. FINDINGS: No consolidation, pneumothorax or effusion. Normal cardiopericardial silhouette. Right IJ chest port with tip along the right atrium. Film is under penetrated. Overlapping cardiac leads. IMPRESSION: No acute cardiopulmonary disease.  Chest port. Electronically Signed   By: Ranell Bring M.D.   On: 10/01/2023 11:56    Pertinent labs & imaging results that were available during my care of the patient were reviewed by me and considered in my medical decision making (see MDM for details).  Medications Ordered in ED Medications  ondansetron  (ZOFRAN ) injection 4 mg (4 mg Intravenous Given 10/01/23 1056)  sodium chloride  0.9 % bolus 1,000 mL (0 mLs Intravenous Stopped 10/01/23 1330)  morphine  (PF) 4 MG/ML injection 4 mg (4 mg Intravenous Given 10/01/23 1057)  iohexol  (OMNIPAQUE ) 350 MG/ML injection 75 mL (75 mLs Intravenous Contrast Given 10/01/23 1201)  HYDROmorphone  (DILAUDID ) injection 1 mg (1 mg Intravenous Given 10/01/23 1325)  ondansetron  (ZOFRAN ) injection 4 mg (4 mg Intravenous Given 10/01/23 1336)  gadobutrol (GADAVIST) 1 MMOL/ML injection 10 mL (10 mLs Intravenous Contrast Given 10/01/23 1420)  HYDROmorphone  (DILAUDID ) injection 0.5 mg (0.5 mg Intravenous Given 10/01/23 1549)  ondansetron  (ZOFRAN ) injection 4 mg (4 mg Intravenous Given 10/01/23 1549)                                                                                                                                     Procedures Procedures  (including critical care time)  Medical Decision Making / ED Course    Medical Decision Making:    ELLARD NAN is a 68 y.o. male with past medical history as [name], Elijah [name], Elijah [name], Elijah [name], Elijah rheumatica T-cell lymphoma who presents to the ED with complaint of weakness. The complaint involves an extensive differential diagnosis and also carries with it a high risk of complications and morbidity.  Serious etiology was  considered. Ddx includes but is not limited to: Medication effect, CVA, neuropathy, neoplasm, metabolic abnormality, fx, etc  Complete initial physical exam performed, notably the patient  was in vomiting but HDS.    Reviewed and confirmed nursing documentation for past medical history, family history, social history.  Vital signs reviewed.     Brief summary:  68 yo/m hx t-cell lymphoma f/w oncology here locally dr federico and at [facility] Left sided weakness, worsened. Falls, dragging his left leg NIHSS 5, LKN was >48 hrs ago, not candidate for TNK/MT given duration symptoms   Clinical Course as of 10/01/23 1610  Wed Oct 01, 2023  1300 History of T-cell lymphoma on [name].  Had CT imaging concerning for possible metastatic disease spread versus primary CNS neoplasm.  Did have mass posterior right frontal lobe and left parietal lobe with mass effect.  He does have left upper and lower extremity weakness and some mild reduced sensation to his left lower extremity.  MRI brain with and without was ordered.  He follows with Dr. federico here locally but also receives treatment at [facility] [SG]  1301 CT abdomen shows perinephric hematoma and internal hemorrhage [SG]    Clinical Course User Index [SG] Elnor Savant A, DO     Pt with likely new mets to his brain, left sided weakness/hemiplegia Dr federico notified Nsgy consulted He also has perinephric hematoma, no thinners, no sig abd pain but apparently fell a few days ago and hit a bathtub Dr federico requesting nsgy to eval ?tissue biopsy Handoff to incoming EDP pending MRI brain results and eventual admission             Additional history obtained: -Additional history obtained from family -External records from outside source obtained and reviewed including: Chart review including previous notes, labs, imaging, consultation notes including  Oncology documentation Prior labs   Lab Tests: -I ordered, reviewed, and interpreted  labs.   The pertinent results include:   Labs Reviewed  CBC WITH DIFFERENTIAL/PLATELET - Abnormal; Notable for the following components:      Result Value   WBC 19.0 (*)    RBC 3.27 (*)    Hemoglobin 10.3 (*)    HCT 32.0 (*)    RDW 16.5 (*)    Neutro Abs 15.0 (*)    Monocytes Absolute 2.8 (*)    Abs Immature Granulocytes 0.15 (*)    All other components within normal limits  COMPREHENSIVE METABOLIC PANEL WITH GFR - Abnormal; Notable for the following components:   Glucose, Bld 141 (*)    Total Protein 6.4 (*)    Albumin  3.4 (*)    All other components within normal limits  URINALYSIS, ROUTINE W REFLEX MICROSCOPIC - Abnormal; Notable for the following components:   Specific Gravity, Urine 1.033 (*)    Hgb urine dipstick MODERATE (*)    All other components within normal limits  MAGNESIUM  CK    Notable for leukocytosis, recent chemo  EKG   EKG Interpretation Date/Time:  Wednesday October 01 2023 09:50:39 EDT Ventricular Rate:  91 PR Interval:  154 QRS Duration:  91 QT Interval:  359 QTC Calculation: 442 R Axis:   89  Text Interpretation: Sinus rhythm Borderline right axis deviation Abnormal R-wave progression, early transition Borderline repolarization abnormality Confirmed by Elnor Savant ([id number]) on 10/01/2023 1:22:34 PM         Imaging Studies ordered: I ordered imaging studies including ct abd/head, mri brain wwo I independently visualized the following imaging with scope of interpretation limited to determining acute life threatening conditions related to emergency care; findings noted above I agree with the radiologist interpretation If any imaging was obtained with  contrast I closely monitored patient for any possible adverse reaction a/w contrast administration in the emergency department   Medicines ordered and prescription drug management: Meds ordered this encounter  Medications   ondansetron  (ZOFRAN ) injection 4 mg   sodium chloride  0.9 % bolus 1,000 mL    morphine  (PF) 4 MG/ML injection 4 mg   iohexol  (OMNIPAQUE ) 350 MG/ML injection 75 mL   DISCONTD: morphine  (PF) 4 MG/ML injection 4 mg   HYDROmorphone  (DILAUDID ) injection 1 mg   ondansetron  (ZOFRAN ) injection 4 mg   gadobutrol (GADAVIST) 1 MMOL/ML injection 10 mL   HYDROmorphone  (DILAUDID ) injection 0.5 mg   ondansetron  (ZOFRAN ) injection 4 mg    -I have reviewed the patients home medicines and have made adjustments as needed   Consultations Obtained: I requested consultation with the neurology, nsgy, oncology,  and discussed lab and imaging findings as well as pertinent plan    Cardiac Monitoring: The patient was maintained on a cardiac monitor.  I personally viewed and interpreted the cardiac monitored which showed an underlying rhythm of: nsr Continuous pulse oximetry interpreted by myself, 99% on ra.    Social Determinants of Health:  Diagnosis or treatment significantly limited by social determinants of health: na   Reevaluation: After the interventions noted above, I reevaluated the patient and found that they have stayed the same  Co morbidities that complicate the patient evaluation  Past Medical History:  Diagnosis Date   Acid reflux    Allergic rhinitis    [name]    Bloating    Elijah [name] (benign prostatic hyperplasia)    Depression    ED (erectile dysfunction)    Epigastric pain    Exercise-induced [name]    Elijah [name] (gastroesophageal reflux disease)    History of kidney stones    Hypercholesteremia    Migraine 2009   Elijah rheumatica (HCC)    Prediabetes    started on metformin  during january hospitalization   Prostate disease    Sleep apnea       Dispostion: Disposition decision including need for hospitalization was considered, and patient disposition pending at time of sign out.    Final Clinical Impression(s) / ED Diagnoses Final diagnoses:  Left-sided weakness  History of lymphoma  Brain lesion        Elnor Jayson LABOR, DO 10/01/23  1610    Elnor Jayson A, DO 10/01/23 1611

## 2023-10-01 NOTE — H&P (Addendum)
 History and Physical    Patient: Elijah [name] DOB: Jan 15, 1956 DOA: 10/01/2023 DOS: the patient was seen and examined on 10/01/2023 PCP: Seabron Lenis, MD  Patient coming from: Home  Chief Complaint:  Chief Complaint  Patient presents with   Weakness   HPI: Elijah [name] is a 68 y.o. male with medical history significant of monomorphic epithelialotropic intestinal T-cell lymphoma-who is being co-managed by [facility] health oncology and [facility]  hematology clinic-presented to the ED with worsening left-sided weakness.    Per chart review-patient completed 6 cycles of chemotherapy on 6/9-subsequent PET/CT showed remission-bone marrow biopsy was recommended to fully restage disease.  During his last cycle of chemotherapy-patient has had intermittent generalized weakness-wife has noted that he did start having stuttering gross tremors/jerking movements of his left arm and left leg during that time as well.  However these intermittent jerking movements-would get better with time.  He also has had generalized weakness-but has been on Cymbalta /numerous antiemetics that were felt to be contributing.  He was advised to stop Cymbalta -however over the past 2-3 days-he has had progressive weakness of his left upper and lower extremities.  Wife reports at least 4 episodes of falls in the past several days as well.  During 1 of these episodes of fall-he did bang his left flank area against a piece of furniture causing significant ecchymosis.  He was subsequently brought to the emergency room-where neuroimaging studies showed ill-defined lesions in the right frontal/left parietal lobe, CT abdomen/pelvis showed a perinephric hematoma with mild left hydronephrosis.  Neurosurgery, trauma service was asked to consult-subsequently [facility] hospitalist was asked to admit this patient for further evaluation and treatment.  Patient has had numerous episodes of vomiting over the past several  days.  He has had significant pain in his left leg-left flank area-requiring numerous doses of oral narcotics at home.  No history of fever No chest pain or shortness of breath No abdominal pain-last BM was yesterday. No dysuria/hematuria No diarrhea.  Review of Systems: As mentioned in the history of present illness. All other systems reviewed and are negative. Past Medical History:  Diagnosis Date   Acid reflux    Allergic rhinitis    Asthma    Bloating    BPH (benign prostatic hyperplasia)    Depression    ED (erectile dysfunction)    Epigastric pain    Exercise-induced asthma    GERD (gastroesophageal reflux disease)    History of kidney stones    Hypercholesteremia    Migraine 2009   Polymyalgia rheumatica (HCC)    Prediabetes    started on metformin  during january hospitalization   Prostate disease    Sleep apnea    Past Surgical History:  Procedure Laterality Date   ANKLE SURGERY Right    BOWEL RESECTION N/A 04/10/2023   Procedure: SMALL BOWEL RESECTION WITH MASS;  Surgeon: Polly Cordella LABOR, MD;  Location: [facility] OR;  Service: General;  Laterality: N/A;   CHEST TUBE INSERTION Left    COLONOSCOPY     CYSTOSCOPY WITH RETROGRADE PYELOGRAM, URETEROSCOPY AND STENT PLACEMENT Left 06/29/2020   Procedure: CYSTOSCOPY WITH RETROGRADE PYELOGRAM, URETEROSCOPY , BASKET EXTRACTION, AND STENT PLACEMENT;  Surgeon: Alvaro Hummer, MD;  Location: [facility] ORS;  Service: Urology;  Laterality: Left;  1 HR   IR CV LINE INJECTION  07/25/2023   IR IMAGING GUIDED PORT INSERTION  05/13/2023   KNEE SURGERY Right    LAPAROTOMY N/A 04/10/2023   Procedure: EXPLORATORY LAPAROTOMY;  Surgeon: Polly Cordella LABOR,  MD;  Location: [facility] OR;  Service: General;  Laterality: N/A;   SKIN CANCER EXCISION     unsure   TONSILLECTOMY     VASECTOMY     WISDOM TOOTH EXTRACTION     Social History:  reports that he has never smoked. He has never been exposed to tobacco smoke. He has never used smokeless tobacco. He  reports current alcohol use. He reports that he does not use drugs.    Family History  Problem Relation Age of Onset   Healthy Son    Healthy Daughter     Prior to Admission medications   Medication Sig Start Date End Date Taking? Authorizing Provider  Accu-Chek Softclix Lancets lancets Use daily before breakfast. 04/16/23   Briana Elgin LABOR, MD  albuterol  (VENTOLIN  HFA) 108 (90 Base) MCG/ACT inhaler Inhale 2 puffs into the lungs every 4 (four) hours as needed. 09/15/23     alfuzosin  (UROXATRAL ) 10 MG 24 hr tablet Take 1 tablet (10 mg total) by mouth daily. 07/25/23     allopurinol  (ZYLOPRIM ) 300 MG tablet Take 1 tablet (300 mg total) by mouth daily. 05/19/23   [name], John T IV, MD  amoxicillin -clavulanate (AUGMENTIN ) 875-125 MG tablet Take 1 tablet (875 mg total) by mouth every 12 (twelve) hours for 120 days 06/13/23     Blood Glucose Monitoring Suppl (ACCU-CHEK GUIDE) w/Device KIT Use daily before breakfast. 04/16/23   Briana Elgin LABOR, MD  CINNAMON PO Take 1 capsule by mouth daily.    [provider]  DULoxetine  (CYMBALTA ) 30 MG capsule Take 1 capsule (30 mg total) by mouth daily. 09/15/23   Federico Norleen ONEIDA MADISON, MD  finasteride  (PROSCAR ) 5 MG tablet Take 1 tablet (5 mg total) by mouth daily. 02/11/23     glucose blood (ACCU-CHEK GUIDE TEST) test strip Use daily before breakfast. 04/16/23   Briana Elgin LABOR, MD  Glucose Blood (BLOOD GLUCOSE TEST STRIPS) STRP Use to check blood sugar before breakfast as directed 04/16/23   Briana Elgin LABOR, MD  lidocaine -prilocaine  (EMLA ) cream Apply 1 Application topically as needed. 05/19/23   Federico Norleen ONEIDA MADISON, MD  loratadine  (CLARITIN ) 10 MG tablet Take 10 mg by mouth daily.    [provider]  methocarbamol  (ROBAXIN ) 500 MG tablet Take 2 tablets (1,000 mg total) by mouth every 8 (eight) hours as needed for muscle spasms. 06/16/23   Federico Norleen ONEIDA MADISON, MD  minoxidil  (LONITEN ) 2.5 MG tablet Take 1 tablet (2.5 mg total) by mouth daily. 12/03/22      ondansetron  (ZOFRAN ) 8 MG tablet Take 1 tablet (8 mg total) by mouth every 8 (eight) hours as needed. 06/17/23   Federico Norleen ONEIDA MADISON, MD  oxyCODONE  (OXY IR/ROXICODONE ) 5 MG immediate release tablet Take 1-2 tablets (5-10 mg total) by mouth every 6 (six) hours as needed for severe pain (pain score 7-10). 04/23/23   Federico Norleen ONEIDA MADISON, MD  pantoprazole  (PROTONIX ) 40 MG tablet Take 1 tablet (40 mg total) by mouth daily 30 minutes to 1 hour before morning meal. 05/01/23     predniSONE  (DELTASONE ) 1 MG tablet Take 3 tablets by mouth daily, along with a 5mg  tablet for a total of 8mg  daily for 1 month. Taper by 1mg  monthly. 06/16/23   Dolphus Reiter, MD  predniSONE  (DELTASONE ) 20 MG tablet Take 3 tablets (60 mg total) by mouth daily with breakfast. Take for 5 days starting day one of chemotherapy 06/16/23   [name], John T IV, MD  Probiotic TBEC Take 1  capsule by mouth daily.    [provider]  prochlorperazine  (COMPAZINE ) 10 MG tablet Take 1 tablet (10 mg total) by mouth every 6 (six) hours as needed for nausea or vomiting. 07/20/23   Federico Norleen ONEIDA MADISON, MD    Physical Exam: Vitals:   10/01/23 1352 10/01/23 1445 10/01/23 1448 10/01/23 1600  BP:  (!) 162/95  (!) 157/92  Pulse: 88 86 89 89  Resp:  17 16 12   Temp:   98.9 F (37.2 C)   TempSrc:      SpO2: 99% 100% 99% 98%  Weight:      Height:       Gen Exam:Alert awake-not in any distress HEENT:atraumatic, normocephalic Chest: B/L clear to auscultation anteriorly CVS:S1S2 regular Abdomen:soft non tender, non distended Extremities:no edema Neurology:  approximate LUE/LLE 3/5. Skin: no rash  Data Reviewed:     Latest Ref Rng & Units 10/01/2023    9:59 AM 09/08/2023    8:08 AM 08/19/2023    9:26 AM  CBC  WBC 4.0 - 10.5 K/uL 19.0  11.9  9.4   Hemoglobin 13.0 - 17.0 g/dL 89.6  88.6  88.2   Hematocrit 39.0 - 52.0 % 32.0  34.0  36.6   Platelets 150 - 400 K/uL 292  263  318         Latest Ref Rng & Units 10/01/2023    9:59 AM 09/08/2023     8:08 AM 08/19/2023    9:26 AM  BMP  Glucose 70 - 99 mg/dL 858  824  840   BUN 8 - 23 mg/dL 16  23  24    Creatinine 0.61 - 1.24 mg/dL 8.92  8.89  8.98   Sodium 135 - 145 mmol/L 141  139  141   Potassium 3.5 - 5.1 mmol/L 3.5  3.6  3.7   Chloride 98 - 111 mmol/L 108  108  110   CO2 22 - 32 mmol/L 24  23  25    Calcium 8.9 - 10.3 mg/dL 9.1  9.0  9.3      Assessment and Plan: New onset brain lesion Suspicion for intracranial involvement by T-cell lymphoma. Per spouse-patient recently had PET scan-including of the brain that was negative for any metastatic disease. Spoke with patient's primary oncologist at [facility] health-Dr. [name]-apparently incredibly rare for T-cell lymphoma to involve brain-recent neuroimaging including PET scan was stable-recommendations are for neurosurgical evaluation for possible stereotactic biopsy.  If biopsy does not need confirm that these brain masses are secondary to his underlying lymphoma-patient may be a candidate for trial studies at [facility]  in [place]. Neurosurgical evaluation in process-Will defer timing of biopsy to neurosurgery In the interim-continue with Decadron  Will obtain EEG brain given intermittent tremor/jerking movement of his upper/lower extremities-although does not appear to be seizures at this point.  Monomorphic epithelialotropic intestinal T-cell lymphoma Comanaged by [facility] health oncology and hematology clinic at [facility]  in [place] Dr. Federico to follow and provide further recommendations  Perinephric hematoma Possibly related to trauma Supportive care in the interim Will briefly discussed with urology but suspect AKI is mostly supportive Follow CBC  Addendum Discussed with Dr. Nola recommendations will be for supportive care-but he will evaluate and provide formal recommendations.  History of polymyalgia rheumatica On chronic prednisone  (gets higher doses with  chemotherapy) Will be on IV Decadron   Leukocytosis Probably reactive-secondary to perinephric hematoma-chronic steroid use Afebrile-no obvious foci of infection apparent Monitor off antibiotics-for now given overall stability  Normocytic anemia  Likely related to underlying malignancy/recent chemotherapy  Vomiting Problem related to underlying malignancy As needed antiemetics CT abdomen without any obstruction-last bowel movement was yesterday.  BPH Finasteride /alfuzosin   HTN Not on any antihypertensives at home-per spouse-patient has had numerous side effects from multiple antihypertensives-amlodipine  listed as a side effect/allergies-in chart For now-will place him on IV labetalol and see what his blood pressure does.  GERD PPI   Advance Care Planning:   Code Status: Full Code   Consults: Neurosurgery, trauma surgery, oncology  Family Communication: Spouse at bedside  Severity of Illness: The appropriate patient status for this patient is INPATIENT. Inpatient status is judged to be reasonable and necessary in order to provide the required intensity of service to ensure the patient's safety. The patient's presenting symptoms, physical exam findings, and initial radiographic and laboratory data in the context of their chronic comorbidities is felt to place them at high risk for further clinical deterioration. Furthermore, it is not anticipated that the patient will be medically stable for discharge from the hospital within 2 midnights of admission.   * I certify that at the point of admission it is my clinical judgment that the patient will require inpatient hospital care spanning beyond 2 midnights from the point of admission due to high intensity of service, high risk for further deterioration and high frequency of surveillance required.*  Author: Donalda Applebaum, MD 10/01/2023 5:27 PM  For on call review [email or web address].

## 2023-10-01 NOTE — Consult Note (Signed)
 Urology Consult  Referring physician: GORMAN Applebaum Reason for referral: renal heamtoma  Chief Complaint: renal hematoma  History of Present Illness: Lymphoma and falls post chemo; left sided weakness; fell multiple times; CT noted renal hematoma; left sided pain with pain meds - fell on the side of the bathtub  Rare lymphoma  I know his wife from [facility] nursing   Hb 10.3 (1.7) Cr 1.07  CT scan:  Adrenals/Urinary Tract: Adrenal glands are unremarkable. 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 Nonobstructive calculus of the inferior pole of the left kidney. Mild left hydronephrosis, the proximal left ureter appears to be compressed by overlying hemorrhage. No ureteral calculus or other obstruction to the ureterovesicular junction. Simple benign right renal cortical cyst, for which no further follow-up or characterization is required. Right kidney is otherwise normal, without renal calculi, solid lesion, or hydronephrosis. Bladder is unremarkable.  - NO extravasation on scan         Past Medical History:  Diagnosis Date   Acid reflux    Allergic rhinitis    Asthma    Bloating    BPH (benign prostatic hyperplasia)    Depression    ED (erectile dysfunction)    Epigastric pain    Exercise-induced asthma    GERD (gastroesophageal reflux disease)    History of kidney stones    Hypercholesteremia    Migraine 2009   Polymyalgia rheumatica (HCC)    Prediabetes    started on metformin  during january hospitalization   Prostate disease    Sleep apnea    Past Surgical History:  Procedure Laterality Date   ANKLE SURGERY Right    BOWEL RESECTION N/A 04/10/2023   Procedure: SMALL BOWEL RESECTION WITH MASS;  Surgeon: Polly Cordella LABOR, MD;  Location: [facility] OR;  Service: General;  Laterality: N/A;    CHEST TUBE INSERTION Left    COLONOSCOPY     CYSTOSCOPY WITH RETROGRADE PYELOGRAM, URETEROSCOPY AND STENT PLACEMENT Left 06/29/2020   Procedure: CYSTOSCOPY WITH RETROGRADE PYELOGRAM, URETEROSCOPY , BASKET EXTRACTION, AND STENT PLACEMENT;  Surgeon: Alvaro Hummer, MD;  Location: [facility] OR[facility];  Service: Urology;  Laterality: Left;  1 HR   IR CV LINE INJECTION  07/25/2023   IR IMAGING GUIDED PORT INSERTION  05/13/2023   KNEE SURGERY Right    LAPAROTOMY N/A 04/10/2023   Procedure: EXPLORATORY LAPAROTOMY;  Surgeon: Polly Cordella LABOR, MD;  Location: [facility] OR;  Service: General;  Laterality: N/A;   SKIN CANCER EXCISION     unsure   TONSILLECTOMY     VASECTOMY     WISDOM TOOTH EXTRACTION      Medications: I have reviewed the patient's current medications. Allergies:  Allergies  Allergen Reactions   Singulair [Montelukast] Hives   Amlodipine  Other (See Comments)    agitation   Crestor [Rosuvastatin] Other (See Comments)    Muscle Aches     Family History  Problem Relation Age of Onset   Healthy Son    Healthy Daughter    Social History:  reports that he has never smoked. He has never been exposed to tobacco smoke. He has never used smokeless tobacco. He reports current alcohol use. He reports that he does not use drugs.  ROS: All systems are reviewed and negative except as noted. Rest negative  Physical Exam:  Vital signs in last 24 hours: Temp:  [98.8 F (37.1  C)-98.9 F (37.2 C)] 98.9 F (37.2 C) (07/02 1448) Pulse Rate:  [86-95] 89 (07/02 1600) Resp:  [12-17] 12 (07/02 1600) BP: (157-193)/(77-102) 157/92 (07/02 1600) SpO2:  [98 %-100 %] 98 % (07/02 1600) Weight:  [106.6 kg] 106.6 kg (07/02 0952)  Cardiovascular: Skin warm; not flushed Respiratory: Breaths quiet; no shortness of breath Abdomen: No masses Neurological: Normal sensation to touch Musculoskeletal: Normal motor function arms and legs Lymphatics: No inguinal adenopathy Skin: No rashes Genitourinary:left flank  bruising  Laboratory Data:  Results for orders placed or performed during the hospital encounter of 10/01/23 (from the past 72 hours)  CBC with Differential     Status: Abnormal   Collection Time: 10/01/23  9:59 AM  Result Value Ref Range   WBC 19.0 (H) 4.0 - 10.5 K/uL   RBC 3.27 (L) 4.22 - 5.81 MIL/uL   Hemoglobin 10.3 (L) 13.0 - 17.0 g/dL   HCT 67.9 (L) 60.9 - 47.9 %   MCV 97.9 80.0 - 100.0 fL   MCH 31.5 26.0 - 34.0 pg   MCHC 32.2 30.0 - 36.0 g/dL   RDW 83.4 (H) 88.4 - 84.4 %   Platelets 292 150 - 400 K/uL   nRBC 0.0 0.0 - 0.2 %   Neutrophils Relative % 78 %   Neutro Abs 15.0 (H) 1.7 - 7.7 K/uL   Lymphocytes Relative 4 %   Lymphs Abs 0.8 0.7 - 4.0 K/uL   Monocytes Relative 15 %   Monocytes Absolute 2.8 (H) 0.1 - 1.0 K/uL   Eosinophils Relative 1 %   Eosinophils Absolute 0.2 0.0 - 0.5 K/uL   Basophils Relative 1 %   Basophils Absolute 0.1 0.0 - 0.1 K/uL   Immature Granulocytes 1 %   Abs Immature Granulocytes 0.15 (H) 0.00 - 0.07 K/uL    Comment: Performed at [facility], [address], [place], [place] [address]  Comprehensive metabolic panel     Status: Abnormal   Collection Time: 10/01/23  9:59 AM  Result Value Ref Range   Sodium 141 135 - 145 mmol/L   Potassium 3.5 3.5 - 5.1 mmol/L   Chloride 108 98 - 111 mmol/L   CO2 24 22 - 32 mmol/L   Glucose, Bld 141 (H) 70 - 99 mg/dL    Comment: Glucose reference range applies only to samples taken after fasting for at least 8 hours.   BUN 16 8 - 23 mg/dL   Creatinine, Ser 8.92 0.61 - 1.24 mg/dL   Calcium 9.1 8.9 - 89.6 mg/dL   Total Protein 6.4 (L) 6.5 - 8.1 g/dL   Albumin  3.4 (L) 3.5 - 5.0 g/dL   AST 16 15 - 41 U/L   ALT 25 0 - 44 U/L   Alkaline Phosphatase 61 38 - 126 U/L   Total Bilirubin 0.6 0.0 - 1.2 mg/dL   GFR, Estimated >39 >39 mL/min    Comment: (NOTE) Calculated using the CKD-EPI Creatinine Equation (2021)    Anion gap 9 5 - 15    Comment: Performed at [facility], [address]  [place], [place] [address]  Magnesium     Status: None   Collection Time: 10/01/23  9:59 AM  Result Value Ref Range   Magnesium 1.8 1.7 - 2.4 mg/dL    Comment: Performed at [facility], [address], [place], [place] [address]  CK     Status: None   Collection Time: 10/01/23  9:59 AM  Result Value Ref Range  Total CK 101 49 - 397 U/L    Comment: Performed at [facility], [address], [place], [place] [address]  Urinalysis, Routine w reflex microscopic -Urine, Clean Catch     Status: Abnormal   Collection Time: 10/01/23 12:15 PM  Result Value Ref Range   Color, Urine YELLOW YELLOW   APPearance CLEAR CLEAR   Specific Gravity, Urine 1.033 (H) 1.005 - 1.030   pH 5.0 5.0 - 8.0   Glucose, UA NEGATIVE NEGATIVE mg/dL   Hgb urine dipstick MODERATE (A) NEGATIVE   Bilirubin Urine NEGATIVE NEGATIVE   Ketones, ur NEGATIVE NEGATIVE mg/dL   Protein, ur NEGATIVE NEGATIVE mg/dL   Nitrite NEGATIVE NEGATIVE   Leukocytes,Ua NEGATIVE NEGATIVE   RBC / HPF 11-20 0 - 5 RBC/hpf   WBC, UA 0-5 0 - 5 WBC/hpf   Bacteria, UA NONE SEEN NONE SEEN   Squamous Epithelial / HPF 0-5 0 - 5 /HPF   Mucus PRESENT     Comment: Performed at [facility], [address] [place], [place] [address]   No results found for this or any previous visit (from the past 240 hours). Creatinine: Recent Labs    10/01/23 0959  CREATININE 1.07    Xrays: See report/chart As noted    Impression/Assessment:  Blunt left renal trauma; stable; hydro secondary to hematoma should resolve  Plan:  Keep an eye on renal function and Hb Will follow Ideally repeat CT in re 4 months for new baseline Limit activity for few weeks to avoid falling   Caraline Deutschman A Eugena Rhue 10/01/2023, 6:23 PM

## 2023-10-01 NOTE — ED Notes (Signed)
 Hospitalist at bedside

## 2023-10-01 NOTE — ED Notes (Signed)
 Neurology at bedside.

## 2023-10-01 NOTE — Consult Note (Addendum)
 Reason for Consult:brain mass Referring Physician: EDP  Elijah [name] is an 68 y.o. male.   HPI:  68 year old male presented to the ED with progressive left sided weakness over the last 3-4 days. He has had several falls in the last 2 days. His wife accompanies him. States that the weakness has gotten significantly worse every day. He has had a lot of trouble with nausea and vomiting. Has been getting chemotherapy for T cell lymphoma. His last chemotherapy was on 6/13. States that he just had a PET scan last week at [facility] which showed he was in remission. He has PMR and has been weaning off of prednisone  over the last 6 months. He is currently on 5mg  of prednisone  daily. He sees Dr. Dolphus for this.   Past Medical History:  Diagnosis Date   Acid reflux    Allergic rhinitis    Asthma    Bloating    BPH (benign prostatic hyperplasia)    Depression    ED (erectile dysfunction)    Epigastric pain    Exercise-induced asthma    GERD (gastroesophageal reflux disease)    History of kidney stones    Hypercholesteremia    Migraine 2009   Polymyalgia rheumatica (HCC)    Prediabetes    started on metformin  during january hospitalization   Prostate disease    Sleep apnea     Past Surgical History:  Procedure Laterality Date   ANKLE SURGERY Right    BOWEL RESECTION N/A 04/10/2023   Procedure: SMALL BOWEL RESECTION WITH MASS;  Surgeon: Polly Cordella LABOR, MD;  Location: [facility] OR;  Service: General;  Laterality: N/A;   CHEST TUBE INSERTION Left    COLONOSCOPY     CYSTOSCOPY WITH RETROGRADE PYELOGRAM, URETEROSCOPY AND STENT PLACEMENT Left 06/29/2020   Procedure: CYSTOSCOPY WITH RETROGRADE PYELOGRAM, URETEROSCOPY , BASKET EXTRACTION, AND STENT PLACEMENT;  Surgeon: Alvaro Hummer, MD;  Location: [facility];  Service: Urology;  Laterality: Left;  1 HR   IR CV LINE INJECTION  07/25/2023   IR IMAGING GUIDED PORT INSERTION  05/13/2023   KNEE SURGERY Right    LAPAROTOMY N/A 04/10/2023   Procedure:  EXPLORATORY LAPAROTOMY;  Surgeon: Polly Cordella LABOR, MD;  Location: [facility] OR;  Service: General;  Laterality: N/A;   SKIN CANCER EXCISION     unsure   TONSILLECTOMY     VASECTOMY     WISDOM TOOTH EXTRACTION      Allergies  Allergen Reactions   Singulair [Montelukast] Hives   Amlodipine  Other (See Comments)    agitation   Crestor [Rosuvastatin] Other (See Comments)    Muscle Aches     Social History   Tobacco Use   Smoking status: Never    Passive exposure: Never   Smokeless tobacco: Never  Substance Use Topics   Alcohol use: Yes    Comment: social    Family History  Problem Relation Age of Onset   Healthy Son    Healthy Daughter      Review of Systems  Positive ROS: as above  All other systems have been reviewed and were otherwise negative with the exception of those mentioned in the HPI and as above.  Objective: Vital signs in last 24 hours: Temp:  [98.8 F (37.1 C)-98.9 F (37.2 C)] 98.9 F (37.2 C) (07/02 1448) Pulse Rate:  [86-95] 89 (07/02 1600) Resp:  [12-17] 12 (07/02 1600) BP: (157-193)/(77-102) 157/92 (07/02 1600) SpO2:  [98 %-100 %] 98 % (07/02 1600) Weight:  [106.6 kg]  106.6 kg (07/02 0952)  General Appearance: Alert, cooperative, no distress, appears stated age Head: Normocephalic, without obvious abnormality, atraumatic Eyes: PERRL, conjunctiva/corneas clear, EOM's intact, fundi benign, both eyes      Lungs:  respirations unlabored Heart: Regular rate and rhythm Extremities: Extremities normal, atraumatic, no cyanosis or edema Pulses: 2+ and symmetric all extremities Skin: Skin color, texture, turgor normal, no rashes or lesions  NEUROLOGIC:   Mental status: A&O x4, no aphasia, good attention span, Memory and fund of knowledge Motor Exam - LUE 3/5, LLE 2/5 Sensory Exam - grossly normal Reflexes: symmetric, no pathologic reflexes, No Hoffman's, No clonus Coordination - grossly normal Gait - not tested Balance -not tested Cranial Nerves: I:  smell Not tested  II: visual acuity  OS: na    OD: na  II: visual fields Full to confrontation  II: pupils Equal, round, reactive to light  III,VII: ptosis None  III,IV,VI: extraocular muscles  Full ROM  V: mastication Normal  V: facial light touch sensation  Normal  V,VII: corneal reflex  Present  VII: facial muscle function - upper  Normal  VII: facial muscle function - lower Normal  VIII: hearing Not tested  IX: soft palate elevation  Normal  IX,X: gag reflex Present  XI: trapezius strength  5/5  XI: sternocleidomastoid strength 5/5  XI: neck flexion strength  5/5  XII: tongue strength  Normal    Data Review Lab Results  Component Value Date   WBC 19.0 (H) 10/01/2023   HGB 10.3 (L) 10/01/2023   HCT 32.0 (L) 10/01/2023   MCV 97.9 10/01/2023   PLT 292 10/01/2023   Lab Results  Component Value Date   NA 141 10/01/2023   K 3.5 10/01/2023   CL 108 10/01/2023   CO2 24 10/01/2023   BUN 16 10/01/2023   CREATININE 1.07 10/01/2023   GLUCOSE 141 (H) 10/01/2023   Lab Results  Component Value Date   INR 1.1 06/05/2023    Radiology: MR Brain W and Wo Contrast Result Date: 10/01/2023 CLINICAL DATA:  Metastatic disease evaluation brain met on ct EXAM: MRI HEAD WITHOUT AND WITH CONTRAST TECHNIQUE: Multiplanar, multiecho pulse sequences of the brain and surrounding structures were obtained without and with intravenous contrast. CONTRAST:  10mL GADAVIST GADOBUTROL 1 MMOL/ML IV SOLN COMPARISON:  CT of the head dated October 01, 2023 and MRI of the brain dated Aug 19, 2005. FINDINGS: Brain: There is an amorphous area of enhancement present within the right posterior frontal lobe, that corresponds with the area of abnormally increased density noted on the previous CT. There is also a similar area of abnormal enhancement and surrounding edema within the left parietal lobe in the region of increased density and sulcal effacement. There is also a small focal area of irregular enhancement within the  left frontal centrum semiovale, which appears to be related to a developmental venous anomaly. There is increased T2 signal and sulcal effacement involving the right frontal lobe and left parietal lesions. Both lesions are mildly hyperintense on diffusion. 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 Vascular: There are normal vascular flow voids present. Skull and upper cervical spine: Normal marrow signal. No osseous lesions. Sinuses/Orbits: Clear paranasal sinuses.  Normal orbits. Other: None. IMPRESSION: 1. There are ill-defined lesions present within the right frontal lobe and left parietal lobe, which correspond with the areas of increased density/cortical thickening noted on the previous CT.  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 Metastatic disease is considered significantly less likely. 2. 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 It was present back in 2007, but is more conspicuous on the current exam. Electronically Signed   By: Evalene Coho M.D.   On: 10/01/2023 15:26   CT ABDOMEN PELVIS W CONTRAST Result Date: 10/01/2023 CLINICAL DATA:  Abdominal pain, left-sided weakness history of T-cell lymphoma * Tracking Code: BO * EXAM: CT ABDOMEN AND PELVIS WITH CONTRAST CT LUMBAR SPINE WITH CONTRAST TECHNIQUE: Multidetector CT imaging of the abdomen and pelvis was performed using the standard protocol following bolus administration of intravenous contrast. Multidetector CT imaging of the lumbar spine was performed using the standard protocol following bolus administration of intravenous contrast. RADIATION DOSE REDUCTION: This exam was performed according to the departmental dose-optimization program which includes automated  exposure control, adjustment of the mA and/or kV according to patient size and/or use of iterative reconstruction technique. CONTRAST:  75mL OMNIPAQUE  IOHEXOL  350 MG/ML SOLN COMPARISON:  07/25/2023 FINDINGS: CT ABDOMEN PELVIS FINDINGS Lower chest: No acute findings. Hepatobiliary: No solid liver abnormality is seen. Hepatic steatosis. No gallstones, gallbladder wall thickening, or biliary dilatation. Pancreas: Unremarkable. No pancreatic ductal dilatation or surrounding inflammatory changes. Spleen: Normal in size without significant abnormality. Adrenals/Urinary Tract: Adrenal glands are unremarkable. 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 Nonobstructive calculus of the inferior pole of the left kidney. Mild left hydronephrosis, the proximal left ureter appears to be compressed by overlying hemorrhage. No ureteral calculus or other obstruction to the ureterovesicular junction. Simple benign right renal cortical cyst, for which no further follow-up or characterization is required. Right kidney is otherwise normal, without renal calculi, solid lesion, or hydronephrosis. Bladder is unremarkable. Stomach/Bowel: Stomach is within normal limits. Appendix appears normal. No evidence of bowel wall thickening, distention, or inflammatory changes. Vascular/Lymphatic: Aortic atherosclerosis. No enlarged abdominal or pelvic lymph nodes. Reproductive: Prostatomegaly. Other: No abdominal wall hernia or abnormality. No ascites. Musculoskeletal: No acute osseous findings. CT LUMBAR SPINE FINDINGS Alignment: Normal lumbar lordosis. Vertebral bodies: Intact. No fracture or dislocation. Disc spaces: Focally moderate disc space height loss and osteophytosis of L5-S1 with otherwise minimal lumbar disc degenerative change. Mild facet degenerative change of  the lower lumbar levels. Paraspinous soft tissues: Unremarkable. IMPRESSION: 1. 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 2. Mild left hydronephrosis, the proximal left ureter appears to be compressed by overlying hemorrhage. No ureteral calculus or other obstruction to the ureterovesicular junction. 3. Nonobstructive calculus of the inferior pole of the left kidney. 4. Hepatic steatosis. 5. Prostatomegaly. 6. No fracture or dislocation of the lumbar spine. 7. Focally moderate disc degenerative change at L5-S1. Aortic Atherosclerosis (ICD10-I70.0). Electronically Signed   By: Marolyn JONETTA Jaksch M.D.   On: 10/01/2023 12:52   CT L-SPINE NO CHARGE Result Date: 10/01/2023 CLINICAL DATA:  Abdominal pain, left-sided weakness history of T-cell lymphoma * Tracking Code: BO * EXAM: CT ABDOMEN AND PELVIS WITH CONTRAST CT LUMBAR SPINE WITH CONTRAST TECHNIQUE: Multidetector CT imaging of the abdomen and pelvis was performed using the standard protocol following bolus administration of intravenous contrast. Multidetector CT imaging  of the lumbar spine was performed using the standard protocol following bolus administration of intravenous contrast. RADIATION DOSE REDUCTION: This exam was performed according to the departmental dose-optimization program which includes automated exposure control, adjustment of the mA and/or kV according to patient size and/or use of iterative reconstruction technique. CONTRAST:  75mL OMNIPAQUE  IOHEXOL  350 MG/ML SOLN COMPARISON:  07/25/2023 FINDINGS: CT ABDOMEN PELVIS FINDINGS Lower chest: No acute findings. Hepatobiliary: No solid liver abnormality is seen. Hepatic steatosis. No gallstones, gallbladder wall thickening, or biliary dilatation. Pancreas: Unremarkable. No pancreatic ductal dilatation or surrounding inflammatory changes. Spleen:  Normal in size without significant abnormality. Adrenals/Urinary Tract: Adrenal glands are unremarkable. 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 Nonobstructive calculus of the inferior pole of the left kidney. Mild left hydronephrosis, the proximal left ureter appears to be compressed by overlying hemorrhage. No ureteral calculus or other obstruction to the ureterovesicular junction. Simple benign right renal cortical cyst, for which no further follow-up or characterization is required. Right kidney is otherwise normal, without renal calculi, solid lesion, or hydronephrosis. Bladder is unremarkable. Stomach/Bowel: Stomach is within normal limits. Appendix appears normal. No evidence of bowel wall thickening, distention, or inflammatory changes. Vascular/Lymphatic: Aortic atherosclerosis. No enlarged abdominal or pelvic lymph nodes. Reproductive: Prostatomegaly. Other: No abdominal wall hernia or abnormality. No ascites. Musculoskeletal: No acute osseous findings. CT LUMBAR SPINE FINDINGS Alignment: Normal lumbar lordosis. Vertebral bodies: Intact. No fracture or dislocation. Disc spaces: Focally moderate disc space height loss and osteophytosis of L5-S1 with otherwise minimal lumbar disc degenerative change. Mild facet degenerative change of the lower lumbar levels. Paraspinous soft tissues: Unremarkable. IMPRESSION: 1. 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 2. Mild left hydronephrosis, the proximal left ureter appears to be compressed by overlying hemorrhage. No ureteral calculus or other obstruction to the ureterovesicular junction. 3. Nonobstructive calculus of the inferior  pole of the left kidney. 4. Hepatic steatosis. 5. Prostatomegaly. 6. No fracture or dislocation of the lumbar spine. 7. Focally moderate disc degenerative change at L5-S1. Aortic Atherosclerosis (ICD10-I70.0). Electronically Signed   By: Marolyn JONETTA Jaksch M.D.   On: 10/01/2023 12:52   DG Femur Min 2 Views Left Result Date: 10/01/2023 CLINICAL DATA:  fall, left thigh pain EXAM: LEFT FEMUR 2 VIEWS COMPARISON:  None Available. FINDINGS: No acute fracture or dislocation. There is no evidence of arthropathy or other focal bone abnormality. Peripheral vascular atherosclerosis. Couple of surgical clips are noted in the proximal and medial thigh. IMPRESSION: No acute fracture or dislocation. Electronically Signed   By: Rogelia Myers M.D.   On: 10/01/2023 12:27   CT Head Wo Contrast Result Date: 10/01/2023 CLINICAL DATA:  Neuro deficit, concern for stroke, left-sided weakness for 3 weeks. EXAM: CT HEAD WITHOUT CONTRAST TECHNIQUE: Contiguous axial images were obtained from the base of the skull through the vertex without intravenous contrast. RADIATION DOSE REDUCTION: This exam was performed according to the departmental dose-optimization program which includes automated exposure control, adjustment of the mA and/or kV according to patient size and/or use of iterative reconstruction technique. COMPARISON:  CT head 06/17/2007. FINDINGS: Brain: Abnormal appearance of cortex within the right frontoparietal lobes near the vertex with suggestion of cortical thickening and mild hyperattenuation. There is associated sulcal effacement and local mass effect.  Possible mild vasogenic edema in the posterior right frontal lobe. No definite encephalomalacia. Additional region of masslike soft tissue and possible cortical thickening involving the posterior left parietal lobe. No acute intracranial hemorrhage. Possible remote lacunar infarcts in the right caudate head and right anterior limb of the internal capsule. Nonspecific  hypoattenuation in the periventricular and subcortical white matter favored to reflect chronic microvascular ischemic changes. Ventricles are unremarkable. No midline shift. Basilar cisterns are patent. Vascular: No hyperdense vessel or unexpected calcification. Skull: Normal. Negative for fracture or focal lesion. Sinuses/Orbits: No acute finding. Other: None. IMPRESSION: Foci of masslike soft tissue and cortical thickening involving the posterior right frontal lobe near the vertex and the left parietal lobe with local mass effect and sulcal effacement. Findings concerning for metastatic disease versus primary CNS neoplasm. Recommend MRI head with and without contrast for further evaluation. No acute intracranial hemorrhage. 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 Electronically Signed   By: Donnice Mania M.D.   On: 10/01/2023 12:16   DG Chest 1 View Result Date: 10/01/2023 CLINICAL DATA:  Pain after fall EXAM: CHEST  1 VIEW portable semi upright COMPARISON:  X-ray 06/03/2023. FINDINGS: No consolidation, pneumothorax or effusion. Normal cardiopericardial silhouette. Right IJ chest port with tip along the right atrium. Film is under penetrated. Overlapping cardiac leads. IMPRESSION: No acute cardiopulmonary disease.  Chest port. Electronically Signed   By: Ranell Bring M.D.   On: 10/01/2023 11:56    Assessment/Plan: 68 year old male presented to the ED today with progressive left sided weakness. 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 Recommend hospitalist admission. We are going to increase his daily steroids. Will discuss case with Dr. Joshua.   I have reviewed the imaging and I agree with the report.  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  His symptoms may get better with Decadron .  Recommend oncology workup and medicine admission.  May be able to diagnose lesion with CSF sampling.  We are available  for stereotactic biopsy if necessary.  Do not believe he needs debulking surgery at this time.  Suzen Lacks Meyran 10/01/2023 4:19 PM

## 2023-10-01 NOTE — ED Notes (Signed)
Pt requesting meds for pain. 

## 2023-10-02 ENCOUNTER — Inpatient Hospital Stay (HOSPITAL_COMMUNITY)

## 2023-10-02 ENCOUNTER — Other Ambulatory Visit: Payer: Self-pay

## 2023-10-02 ENCOUNTER — Inpatient Hospital Stay (HOSPITAL_COMMUNITY): Admitting: Certified Registered Nurse Anesthetist

## 2023-10-02 ENCOUNTER — Encounter: Payer: Self-pay | Admitting: Hematology and Oncology

## 2023-10-02 ENCOUNTER — Encounter (HOSPITAL_COMMUNITY)
Admission: EM | Disposition: A | Discharge status: Home Health | Payer: Self-pay | Source: Home / Self Care | Attending: Internal Medicine

## 2023-10-02 DIAGNOSIS — S37009A Unspecified injury of unspecified kidney, initial encounter: Secondary | ICD-10-CM | POA: Diagnosis not present

## 2023-10-02 DIAGNOSIS — R296 Repeated falls: Secondary | ICD-10-CM

## 2023-10-02 DIAGNOSIS — G4730 Sleep apnea, unspecified: Secondary | ICD-10-CM | POA: Diagnosis not present

## 2023-10-02 DIAGNOSIS — R569 Unspecified convulsions: Secondary | ICD-10-CM | POA: Diagnosis not present

## 2023-10-02 DIAGNOSIS — R531 Weakness: Secondary | ICD-10-CM | POA: Diagnosis not present

## 2023-10-02 DIAGNOSIS — E119 Type 2 diabetes mellitus without complications: Secondary | ICD-10-CM

## 2023-10-02 DIAGNOSIS — R9089 Other abnormal findings on diagnostic imaging of central nervous system: Secondary | ICD-10-CM

## 2023-10-02 DIAGNOSIS — G9389 Other specified disorders of brain: Secondary | ICD-10-CM | POA: Diagnosis not present

## 2023-10-02 DIAGNOSIS — R451 Restlessness and agitation: Secondary | ICD-10-CM | POA: Diagnosis not present

## 2023-10-02 HISTORY — PX: PR DURAL GRAFT SPINAL: 63710

## 2023-10-02 HISTORY — PX: APPLICATION OF CRANIAL NAVIGATION: SHX6578

## 2023-10-02 LAB — COMPREHENSIVE METABOLIC PANEL WITH GFR
ALT: 21 U/L (ref 0–44)
AST: 14 U/L — ABNORMAL LOW (ref 15–41)
Albumin: 3.3 g/dL — ABNORMAL LOW (ref 3.5–5.0)
Alkaline Phosphatase: 60 U/L (ref 38–126)
Anion gap: 12 (ref 5–15)
BUN: 12 mg/dL (ref 8–23)
CO2: 21 mmol/L — ABNORMAL LOW (ref 22–32)
Calcium: 9.3 mg/dL (ref 8.9–10.3)
Chloride: 104 mmol/L (ref 98–111)
Creatinine, Ser: 0.95 mg/dL (ref 0.61–1.24)
GFR, Estimated: >60 mL/min (ref >60)
Glucose, Bld: 179 mg/dL — ABNORMAL HIGH (ref 70–99)
Potassium: 4.1 mmol/L (ref 3.5–5.1)
Sodium: 137 mmol/L (ref 135–145)
Total Bilirubin: 0.6 mg/dL (ref 0.0–1.2)
Total Protein: 6.5 g/dL (ref 6.5–8.1)

## 2023-10-02 LAB — CBC
HCT: 31.5 % — ABNORMAL LOW (ref 39.0–52.0)
Hemoglobin: 9.9 g/dL — ABNORMAL LOW (ref 13.0–17.0)
MCH: 30.8 pg (ref 26.0–34.0)
MCHC: 31.4 g/dL (ref 30.0–36.0)
MCV: 98.1 fL (ref 80.0–100.0)
Platelets: 284 10*3/uL (ref 150–400)
RBC: 3.21 MIL/uL — ABNORMAL LOW (ref 4.22–5.81)
RDW: 16 % — ABNORMAL HIGH (ref 11.5–15.5)
WBC: 18.3 10*3/uL — ABNORMAL HIGH (ref 4.0–10.5)
nRBC: 0 % (ref 0.0–0.2)

## 2023-10-02 LAB — HIV ANTIBODY (ROUTINE TESTING W REFLEX): HIV Screen 4th Generation wRfx: NONREACTIVE

## 2023-10-02 LAB — TYPE AND SCREEN
ABO/RH(D): O POS
Antibody Screen: NEGATIVE

## 2023-10-02 SURGERY — FRAMELESS STEREOTACTIC BIOPSY
Anesthesia: General | Laterality: Left

## 2023-10-02 MED ORDER — CHLORHEXIDINE GLUCONATE 0.12 % MT SOLN
OROMUCOSAL | Status: AC
  Administered 2023-10-02: 15 mL via OROMUCOSAL
  Filled 2023-10-02: qty 15

## 2023-10-02 MED ORDER — SODIUM CHLORIDE 0.9 % IV SOLN
0.1500 ug/kg/min | Freq: Once | INTRAVENOUS | Status: AC
  Administered 2023-10-02: .1 ug/kg/min via INTRAVENOUS
  Filled 2023-10-02: qty 2000

## 2023-10-02 MED ORDER — ROCURONIUM BROMIDE 10 MG/ML (PF) SYRINGE
PREFILLED_SYRINGE | INTRAVENOUS | Status: AC
  Filled 2023-10-02: qty 10

## 2023-10-02 MED ORDER — BUPIVACAINE HCL (PF) 0.25 % IJ SOLN
INTRAMUSCULAR | Status: DC | PRN
  Administered 2023-10-02: 5 mL

## 2023-10-02 MED ORDER — ONDANSETRON HCL 4 MG/2ML IJ SOLN
INTRAMUSCULAR | Status: DC | PRN
  Administered 2023-10-02: 4 mg via INTRAVENOUS

## 2023-10-02 MED ORDER — FENTANYL CITRATE (PF) 100 MCG/2ML IJ SOLN
INTRAMUSCULAR | Status: AC
  Filled 2023-10-02: qty 2

## 2023-10-02 MED ORDER — PHENYLEPHRINE 80 MCG/ML (10ML) SYRINGE FOR IV PUSH (FOR BLOOD PRESSURE SUPPORT)
PREFILLED_SYRINGE | INTRAVENOUS | Status: DC | PRN
  Administered 2023-10-02 (×3): 80 ug via INTRAVENOUS

## 2023-10-02 MED ORDER — MIDAZOLAM HCL 2 MG/2ML IJ SOLN
INTRAMUSCULAR | Status: DC | PRN
  Administered 2023-10-02 (×2): 1 mg via INTRAVENOUS

## 2023-10-02 MED ORDER — GADOBUTROL 1 MMOL/ML IV SOLN
10.0000 mL | Freq: Once | INTRAVENOUS | Status: AC | PRN
  Administered 2023-10-02: 10 mL via INTRAVENOUS

## 2023-10-02 MED ORDER — THROMBIN 20000 UNITS EX SOLR
CUTANEOUS | Status: AC
  Filled 2023-10-02: qty 20000

## 2023-10-02 MED ORDER — LIDOCAINE 2% (20 MG/ML) 5 ML SYRINGE
INTRAMUSCULAR | Status: DC | PRN
  Administered 2023-10-02: 60 mg via INTRAVENOUS

## 2023-10-02 MED ORDER — CEFAZOLIN SODIUM-DEXTROSE 1-4 GM/50ML-% IV SOLN
INTRAVENOUS | Status: DC | PRN
Start: 2023-10-02 — End: 2023-10-02
  Administered 2023-10-02: 2 g via INTRAVENOUS

## 2023-10-02 MED ORDER — FENTANYL CITRATE (PF) 250 MCG/5ML IJ SOLN
INTRAMUSCULAR | Status: DC | PRN
  Administered 2023-10-02 (×2): 50 ug via INTRAVENOUS
  Administered 2023-10-02: 100 ug via INTRAVENOUS
  Administered 2023-10-02: 50 ug via INTRAVENOUS

## 2023-10-02 MED ORDER — PANTOPRAZOLE SODIUM 40 MG IV SOLR: 40.0000 mg | Freq: Every day | INTRAVENOUS | Status: DC

## 2023-10-02 MED ORDER — 0.9 % SODIUM CHLORIDE (POUR BTL) OPTIME
TOPICAL | Status: DC | PRN
  Administered 2023-10-02: 1000 mL

## 2023-10-02 MED ORDER — PHENYLEPHRINE HCL-NACL 20-0.9 MG/250ML-% IV SOLN
INTRAVENOUS | Status: DC | PRN
  Administered 2023-10-02: 50 ug/min via INTRAVENOUS

## 2023-10-02 MED ORDER — AMISULPRIDE (ANTIEMETIC) 5 MG/2ML IV SOLN: 10.0000 mg | Freq: Once | INTRAVENOUS | Status: DC

## 2023-10-02 MED ORDER — ROCURONIUM BROMIDE 10 MG/ML (PF) SYRINGE
PREFILLED_SYRINGE | INTRAVENOUS | Status: DC | PRN
  Administered 2023-10-02: 20 mg via INTRAVENOUS
  Administered 2023-10-02: 50 mg via INTRAVENOUS

## 2023-10-02 MED ORDER — ONDANSETRON HCL 4 MG/2ML IJ SOLN
INTRAMUSCULAR | Status: AC
  Filled 2023-10-02: qty 2

## 2023-10-02 MED ORDER — SUGAMMADEX SODIUM 200 MG/2ML IV SOLN
INTRAVENOUS | Status: DC | PRN
  Administered 2023-10-02: 200 mg via INTRAVENOUS

## 2023-10-02 MED ORDER — SENNA 8.6 MG PO TABS
1.0000 | ORAL_TABLET | Freq: Two times a day (BID) | ORAL | Status: DC
  Administered 2023-10-02 – 2023-10-03 (×2): 8.6 mg via ORAL
  Filled 2023-10-02 (×2): qty 1

## 2023-10-02 MED ORDER — FLEET ENEMA RE ENEM
1.0000 | ENEMA | Freq: Once | RECTAL | Status: DC | PRN
Start: 2023-10-02 — End: 2023-10-03

## 2023-10-02 MED ORDER — ACETAMINOPHEN 10 MG/ML IV SOLN: 1000.0000 mg | Freq: Once | INTRAVENOUS | Status: DC | PRN

## 2023-10-02 MED ORDER — PROPOFOL 10 MG/ML IV BOLUS
INTRAVENOUS | Status: AC
  Filled 2023-10-02: qty 20

## 2023-10-02 MED ORDER — BUPIVACAINE HCL (PF) 0.25 % IJ SOLN
INTRAMUSCULAR | Status: AC
  Filled 2023-10-02: qty 30

## 2023-10-02 MED ORDER — PROPOFOL 10 MG/ML IV BOLUS
INTRAVENOUS | Status: DC | PRN
  Administered 2023-10-02: 130 mg via INTRAVENOUS

## 2023-10-02 MED ORDER — SODIUM CHLORIDE 0.9 % IV SOLN
0.1500 ug/kg/min | INTRAVENOUS | Status: DC
  Filled 2023-10-02 (×6): qty 2000

## 2023-10-02 MED ORDER — AMISULPRIDE (ANTIEMETIC) 5 MG/2ML IV SOLN
INTRAVENOUS | Status: AC
  Filled 2023-10-02: qty 4

## 2023-10-02 MED ORDER — LABETALOL HCL 5 MG/ML IV SOLN: 10.0000 mg | INTRAVENOUS | Status: DC | PRN

## 2023-10-02 MED ORDER — LACTATED RINGERS IV SOLN: INTRAVENOUS | Status: DC | PRN

## 2023-10-02 MED ORDER — DOCUSATE SODIUM 100 MG PO CAPS
100.0000 mg | ORAL_CAPSULE | Freq: Two times a day (BID) | ORAL | Status: DC
  Administered 2023-10-02: 100 mg via ORAL
  Filled 2023-10-02: qty 1

## 2023-10-02 MED ORDER — LIDOCAINE-EPINEPHRINE 1 %-1:100000 IJ SOLN
INTRAMUSCULAR | Status: AC
  Filled 2023-10-02: qty 1

## 2023-10-02 MED ORDER — BACITRACIN ZINC 500 UNIT/GM EX OINT
TOPICAL_OINTMENT | CUTANEOUS | Status: AC
  Filled 2023-10-02: qty 28.35

## 2023-10-02 MED ORDER — DEXAMETHASONE SODIUM PHOSPHATE 10 MG/ML IJ SOLN
INTRAMUSCULAR | Status: AC
  Filled 2023-10-02: qty 1

## 2023-10-02 MED ORDER — POLYETHYLENE GLYCOL 3350 17 G PO PACK: 17.0000 g | PACK | Freq: Every day | ORAL | Status: DC | PRN

## 2023-10-02 MED ORDER — ORAL CARE MOUTH RINSE: 15.0000 mL | Freq: Once | OROMUCOSAL | Status: AC

## 2023-10-02 MED ORDER — LIDOCAINE 2% (20 MG/ML) 5 ML SYRINGE
INTRAMUSCULAR | Status: AC
  Filled 2023-10-02: qty 5

## 2023-10-02 MED ORDER — MIDAZOLAM HCL 2 MG/2ML IJ SOLN
INTRAMUSCULAR | Status: AC
  Filled 2023-10-02: qty 2

## 2023-10-02 MED ORDER — BACITRACIN ZINC 500 UNIT/GM EX OINT
TOPICAL_OINTMENT | CUTANEOUS | Status: DC | PRN
  Administered 2023-10-02: 1 via TOPICAL

## 2023-10-02 MED ORDER — PROPOFOL 500 MG/50ML IV EMUL
INTRAVENOUS | Status: DC | PRN
  Administered 2023-10-02: 125 ug/kg/min via INTRAVENOUS

## 2023-10-02 MED ORDER — FENTANYL CITRATE (PF) 250 MCG/5ML IJ SOLN
INTRAMUSCULAR | Status: AC
  Filled 2023-10-02: qty 5

## 2023-10-02 MED ORDER — FENTANYL CITRATE (PF) 100 MCG/2ML IJ SOLN
25.0000 ug | INTRAMUSCULAR | Status: DC | PRN
  Administered 2023-10-02: 50 ug via INTRAVENOUS

## 2023-10-02 MED ORDER — CHLORHEXIDINE GLUCONATE 0.12 % MT SOLN: 15.0000 mL | Freq: Once | OROMUCOSAL | Status: AC

## 2023-10-02 MED ORDER — LIDOCAINE-EPINEPHRINE 1 %-1:100000 IJ SOLN
INTRAMUSCULAR | Status: DC | PRN
  Administered 2023-10-02: 5 mL

## 2023-10-02 MED ORDER — THROMBIN 5000 UNITS EX KIT
PACK | CUTANEOUS | Status: AC
  Filled 2023-10-02: qty 1

## 2023-10-02 MED ORDER — SODIUM CHLORIDE 0.9 % IV SOLN: INTRAVENOUS | Status: DC

## 2023-10-02 MED ORDER — LEVETIRACETAM (KEPPRA) 500 MG/5 ML ADULT IV PUSH
500.0000 mg | Freq: Two times a day (BID) | INTRAVENOUS | Status: DC
  Administered 2023-10-02 – 2023-10-03 (×2): 500 mg via INTRAVENOUS
  Filled 2023-10-02 (×2): qty 5

## 2023-10-02 MED ORDER — BISACODYL 10 MG RE SUPP: 10.0000 mg | Freq: Every day | RECTAL | Status: DC | PRN

## 2023-10-02 SURGICAL SUPPLY — 45 items
BAG COUNTER SPONGE SURGICOUNT (BAG) ×1 IMPLANT
BATTERY IQ STERILE (MISCELLANEOUS) ×1 IMPLANT
BLADE CLIPPER SURG (BLADE) IMPLANT
BNDG ADH 1X3 SHEER STRL LF (GAUZE/BANDAGES/DRESSINGS) IMPLANT
BUR 14 MATCH 3 (BUR) IMPLANT
BUR MATCHSTICK NEURO 3.0 LAGG (BURR) IMPLANT
CANISTER SUCTION 3000ML PPV (SUCTIONS) ×1 IMPLANT
COVERAGE SUPPORT O-ARM STEALTH (MISCELLANEOUS) ×1 IMPLANT
DERMABOND ADVANCED .7 DNX12 (GAUZE/BANDAGES/DRESSINGS) IMPLANT
DRAPE SURG IRRIG POUCH 19X23 (DRAPES) IMPLANT
DURAPREP 6ML APPLICATOR 50/CS (WOUND CARE) IMPLANT
ELECTRODE REM PT RTRN 9FT ADLT (ELECTROSURGICAL) ×1 IMPLANT
FEE COVERAGE SUPPORT O-ARM (MISCELLANEOUS) ×1 IMPLANT
GAUZE 4X4 16PLY ~~LOC~~+RFID DBL (SPONGE) ×1 IMPLANT
GLOVE BIOGEL PI IND STRL 8.5 (GLOVE) ×2 IMPLANT
GLOVE ECLIPSE 8.0 STRL XLNG CF (GLOVE) ×1 IMPLANT
GLOVE ECLIPSE 8.5 STRL (GLOVE) ×1 IMPLANT
GLOVE EXAM NITRILE XL STR (GLOVE) IMPLANT
GOWN STRL REUS W/ TWL LRG LVL3 (GOWN DISPOSABLE) IMPLANT
GOWN STRL REUS W/ TWL XL LVL3 (GOWN DISPOSABLE) ×1 IMPLANT
GOWN STRL REUS W/TWL 2XL LVL3 (GOWN DISPOSABLE) ×1 IMPLANT
KIT BASIN OR (CUSTOM PROCEDURE TRAY) ×1 IMPLANT
KIT NDL BIOPSY PASSIVE (NEEDLE) IMPLANT
KIT NEEDLE BIOPSY PASSIVE (NEEDLE) ×1 IMPLANT
KIT TRAJECTORY GUIDE EXTERNAL (MISCELLANEOUS) IMPLANT
KIT TURNOVER KIT B (KITS) ×1 IMPLANT
MARKER SPHERE PSV REFLC NDI (MISCELLANEOUS) ×3 IMPLANT
NDL HYPO 25X1 1.5 SAFETY (NEEDLE) ×1 IMPLANT
NEEDLE HYPO 25X1 1.5 SAFETY (NEEDLE) ×1 IMPLANT
NS IRRIG 1000ML POUR BTL (IV SOLUTION) ×1 IMPLANT
PACK CRANIOTOMY CUSTOM (CUSTOM PROCEDURE TRAY) ×1 IMPLANT
PAD ARMBOARD POSITIONER FOAM (MISCELLANEOUS) ×3 IMPLANT
PATTIES SURGICAL .5 X.5 (GAUZE/BANDAGES/DRESSINGS) ×2 IMPLANT
PATTIES SURGICAL 1X1 (DISPOSABLE) ×2 IMPLANT
PERFORATOR LRG 14-11MM (BIT) IMPLANT
STAPLER SKIN PROX 35W (STAPLE) ×1 IMPLANT
SUT VIC AB 3-0 SH 8-18 (SUTURE) IMPLANT
SUT VIC AB 4-0 RB1 18 (SUTURE) ×1 IMPLANT
SUT VICRYL 3 0 RAPIDE (SUTURE) IMPLANT
SYR 30ML SLIP (SYRINGE) ×2 IMPLANT
SYR 3ML LL SCALE MARK (SYRINGE) ×3 IMPLANT
SYR BULB EAR ULCER 3OZ GRN STR (SYRINGE) ×1 IMPLANT
TOWEL GREEN STERILE (TOWEL DISPOSABLE) ×1 IMPLANT
TOWEL GREEN STERILE FF (TOWEL DISPOSABLE) ×1 IMPLANT
WATER STERILE IRR 1000ML POUR (IV SOLUTION) ×1 IMPLANT

## 2023-10-02 NOTE — Anesthesia Preprocedure Evaluation (Signed)
 Anesthesia Evaluation  Patient identified by MRN, date of birth, ID band Patient awake    Reviewed: Allergy & Precautions, NPO status , Patient's Chart, lab work & pertinent test results  Airway Mallampati: II  TM Distance: >3 FB Neck ROM: Full    Dental no notable dental hx.    Pulmonary asthma , sleep apnea    Pulmonary exam normal        Cardiovascular negative cardio ROS  Rhythm:Regular Rate:Normal     Neuro/Psych  Headaches   Depression    Brain mass    GI/Hepatic Neg liver ROS,GERD  ,,  Endo/Other  diabetes    Renal/GU Renal disease  negative genitourinary   Musculoskeletal negative musculoskeletal ROS (+)    Abdominal Normal abdominal exam  (+)   Peds  Hematology  (+) Blood dyscrasia Lab Results      Component                Value               Date                      WBC                      18.3 (H)            10/02/2023                HGB                      9.9 (L)             10/02/2023                HCT                      31.5 (L)            10/02/2023                MCV                      98.1                10/02/2023                PLT                      284                 10/02/2023             Lab Results      Component                Value               Date                      NA                       137                 10/02/2023                K  4.1                 10/02/2023                CO2                      21 (L)              10/02/2023                GLUCOSE                  179 (H)             10/02/2023                BUN                      12                  10/02/2023                CREATININE               0.95                10/02/2023                CALCIUM                  9.3                 10/02/2023                GFRNONAA                 >60                 10/02/2023              Anesthesia Other Findings    Reproductive/Obstetrics                              Anesthesia Physical Anesthesia Plan  ASA: 3  Anesthesia Plan: General   Post-op Pain Management:    Induction: Intravenous  PONV Risk Score and Plan: 2 and Ondansetron , Dexamethasone , Midazolam  and Treatment may vary due to age or medical condition  Airway Management Planned: Mask and Oral ETT  Additional Equipment: None  Intra-op Plan:   Post-operative Plan: Extubation in OR  Informed Consent: I have reviewed the patients History and Physical, chart, labs and discussed the procedure including the risks, benefits and alternatives for the proposed anesthesia with the patient or authorized representative who has indicated his/her understanding and acceptance.     Dental advisory given  Plan Discussed with: CRNA  Anesthesia Plan Comments:         Anesthesia Quick Evaluation

## 2023-10-02 NOTE — Anesthesia Postprocedure Evaluation (Signed)
 Anesthesia Post Note  Patient: Elijah [name]  Procedure(s) Performed: FRAMELESS STEREOTACTIC BIOPSY (Left) COMPUTER-ASSISTED NAVIGATION, FOR CRANIAL PROCEDURE (Left)     Patient location during evaluation: PACU Anesthesia Type: General Level of consciousness: awake Pain management: pain level controlled Vital Signs Assessment: post-procedure vital signs reviewed and stable Respiratory status: spontaneous breathing, nonlabored ventilation and respiratory function stable Cardiovascular status: blood pressure returned to baseline and stable Postop Assessment: no apparent nausea or vomiting Anesthetic complications: no   No notable events documented.  Last Vitals:  Vitals:   10/02/23 2100 10/02/23 2200  BP: (!) 160/93 (!) 151/88  Pulse: 93 89  Resp: 14 10  Temp:    SpO2: 93% 93%    Last Pain:  Vitals:   10/02/23 2035  TempSrc:   PainSc: 0-No pain                 Wojciech Willetts P Marielle Mantione

## 2023-10-02 NOTE — Transfer of Care (Signed)
 Immediate Anesthesia Transfer of Care Note  Patient: Elijah [name]  Procedure(s) Performed: FRAMELESS STEREOTACTIC BIOPSY (Left) COMPUTER-ASSISTED NAVIGATION, FOR CRANIAL PROCEDURE (Left)  Patient Location: PACU  Anesthesia Type:General  Level of Consciousness: awake, alert , and oriented  Airway & Oxygen Therapy: Patient Spontanous Breathing  Post-op Assessment: Report given to RN and Post -op Vital signs reviewed and stable  Post vital signs: Reviewed and stable  Last Vitals:  Vitals Value Taken Time  BP 128/84 10/02/23 18:15  Temp 36.1 C 10/02/23 18:03  Pulse 92 10/02/23 18:18  Resp 16 10/02/23 18:18  SpO2 93 % 10/02/23 18:18  Vitals shown include unfiled device data.  Last Pain:  Vitals:   10/02/23 1803  TempSrc:   PainSc: 5          Complications: No notable events documented.

## 2023-10-02 NOTE — Op Note (Signed)
 Date of surgery: 10/02/2023 Preoperative diagnosis: History of lymphoma, abnormal intracranial mass right frontal and left parietal. Postoperative diagnosis: Same Left parietal stereotactic brain biopsy with image guidance Surgeon: Victory Gens Anesthesia: General Endotracheal Indications: Elijah [name] is a 68 year old male who has a history of an unusual T-cell lymphoma.  He was recently declared in remission as he had negative PET/CT and he completed significant chemotherapy.  A few days ago he started to develop weakness numbness and tingling in the left hand and left lower extremity which progressed rapidly to a hemiplegia.  An MRI was performed yesterday which shows 2 lesions 1 in the right motor strip corresponding to his left hemiplegia and the other in the left parietal Association cortex.  He has been advised regarding the need for biopsy which is being performed in the left parietal lobe.  Procedure the patient was brought to the operating room placed on table supine position.  After smooth induction of general tracheal anesthesia he was fixed in the 3 pin headrest.  Stereotactic registration with an MRI was performed the target lesion had been selected in the left parietal lobe.  Once the registration was complete the scalp was marked for the chosen entry site.  The scalp was then prepped with alcohol DuraPrep and draped in a sterile fashion.  After infiltrating the skin with 10 cc of lidocaine  with epinephrine  mixed 50-50 with half percent Marcaine a vertical incision was made over the chosen area.  A bur hole was then created in the chosen area with a high-speed drill.  The dura was identified and cauterized.  Then after selecting the appropriate target zone using the stereotactic assembly attached to the headrest we verified the entry site in the target zone.  Depth was noted to be 111 mm to the midportion of the target zone.  With this a sidecutting biopsy needle was inserted to the target  depth and cores were obtained in the anterior projection of the cutting needle for a total of 3 cores from this area.  Then in the posterior projection 3 cores were obtained.  Then from the left 3 cores were obtained and from the right 3 cores of tissue were obtained.  These were placed on wet saline Telfa in 2 separate pieces.  The cores were sent to the pathology lab specifically addressed to the individual handling them.  With the cores being obtained the biopsy needle core was removed.  No significant hemorrhage was noted.  The stereotactic localizing device was removed and then the scalp was closed with 3-0 Vicryl in the galea and 4-0 Vicryl in the subcuticular skin Dermabond was placed on the skin.  Blood loss for the procedure was less than 10 cc.  Patient tolerated procedure well.

## 2023-10-02 NOTE — Anesthesia Procedure Notes (Signed)
 Procedure Name: Intubation Date/Time: 10/02/2023 4:34 PM  Performed by: Mannie Krystal LABOR, CRNAPre-anesthesia Checklist: Patient identified, Emergency Drugs available, Suction available and Patient being monitored Patient Re-evaluated:Patient Re-evaluated prior to induction Oxygen Delivery Method: Circle system utilized Preoxygenation: Pre-oxygenation with 100% oxygen Induction Type: IV induction Ventilation: Mask ventilation without difficulty Laryngoscope Size: Mac and 4 Grade View: Grade II Tube type: Oral Tube size: 7.5 mm Number of attempts: 1 Airway Equipment and Method: Stylet and Oral airway Placement Confirmation: ETT inserted through vocal cords under direct vision, positive ETCO2 and breath sounds checked- equal and bilateral Secured at: 23 cm Tube secured with: Tape Dental Injury: Teeth and Oropharynx as per pre-operative assessment

## 2023-10-02 NOTE — Consult Note (Signed)
 NEUROLOGY CONSULT NOTE   Date of service: October 02, 2023 Patient Name: Elijah [name] MRN:  [id number] DOB:  Jul 18, 1955 Chief Complaint: fall Requesting Provider: Trixie Nilda HERO, MD  History of Present Illness  Elijah [name] is a 68 y.o. male with hx of monomorphic epithelialotropic intestinal T-cell lymphoma, HLD, asthma, GERD, BPH, prediabetes, OSA, depression who presents to [facility] for evaluation of left side weakness and frequent falls. He has noticed his left arm and left leg becoming weak and inability to use his left hand as much. During his last chemo cycle he noticed tremors and jerking movements of his left arm and left leg. In the ED he had CT head which revealed Foci of masslike soft tissue and cortical thickening involving the posterior right frontal lobe near the vertex and the left parietal lobe with local mass effect and sulcal effacement. MRI brain w/wo revealed  ill-defined lesions present within the right frontal lobe and left parietal lobe. Neurosurgery consulted and recommended LP  Neurology was consulted for lumbar puncture   ROS   Comprehensive ROS performed and pertinent positives documented in HPI    Past History   Past Medical History:  Diagnosis Date   Acid reflux    Allergic rhinitis    Asthma    Bloating    BPH (benign prostatic hyperplasia)    Depression    ED (erectile dysfunction)    Epigastric pain    Exercise-induced asthma    GERD (gastroesophageal reflux disease)    History of kidney stones    Hypercholesteremia    Migraine 2009   Polymyalgia rheumatica (HCC)    Prediabetes    started on metformin  during january hospitalization   Prostate disease    Sleep apnea     Past Surgical History:  Procedure Laterality Date   ANKLE SURGERY Right    BOWEL RESECTION N/A 04/10/2023   Procedure: SMALL BOWEL RESECTION WITH MASS;  Surgeon: Polly Cordella LABOR, MD;  Location: [facility] OR;  Service: General;  Laterality: N/A;   CHEST TUBE INSERTION Left     COLONOSCOPY     CYSTOSCOPY WITH RETROGRADE PYELOGRAM, URETEROSCOPY AND STENT PLACEMENT Left 06/29/2020   Procedure: CYSTOSCOPY WITH RETROGRADE PYELOGRAM, URETEROSCOPY , BASKET EXTRACTION, AND STENT PLACEMENT;  Surgeon: Alvaro Hummer, MD;  Location: [facility] ORS;  Service: Urology;  Laterality: Left;  1 HR   IR CV LINE INJECTION  07/25/2023   IR IMAGING GUIDED PORT INSERTION  05/13/2023   KNEE SURGERY Right    LAPAROTOMY N/A 04/10/2023   Procedure: EXPLORATORY LAPAROTOMY;  Surgeon: Polly Cordella LABOR, MD;  Location: [facility] OR;  Service: General;  Laterality: N/A;   SKIN CANCER EXCISION     unsure   TONSILLECTOMY     VASECTOMY     WISDOM TOOTH EXTRACTION      Family History: Family History  Problem Relation Age of Onset   Healthy Son    Healthy Daughter     Social History  reports that he has never smoked. He has never been exposed to tobacco smoke. He has never used smokeless tobacco. He reports current alcohol use. He reports that he does not use drugs.  Allergies  Allergen Reactions   Singulair [Montelukast] Hives   Amlodipine  Other (See Comments)    agitation   Crestor [Rosuvastatin] Other (See Comments)    Muscle Aches     Medications   Current Facility-Administered Medications:    acetaminophen  (TYLENOL ) tablet 650 mg, 650 mg, Oral, Q6H PRN **OR** acetaminophen  (TYLENOL )  suppository 650 mg, 650 mg, Rectal, Q6H PRN, Ghimire, Donalda HERO, MD   albuterol  (PROVENTIL ) (2.5 MG/3ML) 0.083% nebulizer solution 2.5 mg, 2.5 mg, Nebulization, Q2H PRN, Ghimire, Donalda HERO, MD   alfuzosin  (UROXATRAL ) 24 hr tablet 10 mg, 10 mg, Oral, Daily, Ghimire, Shanker M, MD, 10 mg at 10/01/23 2143   dexamethasone  (DECADRON ) injection 10 mg, 10 mg, Intravenous, Q8H, Ghimire, Shanker M, MD, 10 mg at 10/02/23 0400   finasteride  (PROSCAR ) tablet 5 mg, 5 mg, Oral, Daily, Ghimire, Shanker M, MD, 5 mg at 10/01/23 1923   labetalol (NORMODYNE) injection 10 mg, 10 mg, Intravenous, Q2H PRN, Ghimire, Donalda HERO, MD    loratadine  (CLARITIN ) tablet 10 mg, 10 mg, Oral, Daily, Ghimire, Donalda HERO, MD, 10 mg at 10/01/23 8076   morphine  (PF) 2 MG/ML injection 2 mg, 2 mg, Intravenous, Q4H PRN, Ghimire, Shanker M, MD, 2 mg at 10/01/23 2135   ondansetron  (ZOFRAN ) tablet 4 mg, 4 mg, Oral, Q6H PRN **OR** ondansetron  (ZOFRAN ) injection 4 mg, 4 mg, Intravenous, Q6H PRN, Ghimire, Donalda HERO, MD   oxyCODONE  (Oxy IR/ROXICODONE ) immediate release tablet 5 mg, 5 mg, Oral, Q6H PRN, Ghimire, Donalda HERO, MD   pantoprazole  (PROTONIX ) injection 40 mg, 40 mg, Intravenous, Q12H, Ghimire, Shanker M, MD, 40 mg at 10/01/23 2136   polyethylene glycol (MIRALAX  / GLYCOLAX ) packet 17 g, 17 g, Oral, Daily, Ghimire, Shanker M, MD, 17 g at 10/01/23 1924   prochlorperazine  (COMPAZINE ) injection 10 mg, 10 mg, Intravenous, Q6H PRN, Ghimire, Donalda HERO, MD  Current Outpatient Medications:    albuterol  (VENTOLIN  HFA) 108 (90 Base) MCG/ACT inhaler, Inhale 2 puffs into the lungs every 4 (four) hours as needed., Disp: 6.7 g, Rfl: 0   allopurinol  (ZYLOPRIM ) 300 MG tablet, Take 1 tablet (300 mg total) by mouth daily., Disp: 90 tablet, Rfl: 1   DULoxetine  (CYMBALTA ) 30 MG capsule, Take 1 capsule (30 mg total) by mouth daily., Disp: 30 capsule, Rfl: 3   finasteride  (PROSCAR ) 5 MG tablet, Take 1 tablet (5 mg total) by mouth daily., Disp: 90 tablet, Rfl: 3   lidocaine -prilocaine  (EMLA ) cream, Apply 1 Application topically as needed. (Patient taking differently: Apply 1 Application topically as needed (for port).), Disp: 30 g, Rfl: 0   loratadine  (CLARITIN ) 10 MG tablet, Take 10 mg by mouth daily., Disp: , Rfl:    methocarbamol  (ROBAXIN ) 500 MG tablet, Take 2 tablets (1,000 mg total) by mouth every 8 (eight) hours as needed for muscle spasms., Disp: 60 tablet, Rfl: 1   ondansetron  (ZOFRAN ) 8 MG tablet, Take 1 tablet (8 mg total) by mouth every 8 (eight) hours as needed., Disp: 30 tablet, Rfl: 0   oxyCODONE  (OXY IR/ROXICODONE ) 5 MG immediate release tablet, Take 1-2  tablets (5-10 mg total) by mouth every 6 (six) hours as needed for severe pain (pain score 7-10)., Disp: 180 tablet, Rfl: 0   pantoprazole  (PROTONIX ) 40 MG tablet, Take 1 tablet (40 mg total) by mouth daily 30 minutes to 1 hour before morning meal., Disp: 30 tablet, Rfl: 5   predniSONE  (DELTASONE ) 5 MG tablet, Take 5 mg by mouth daily with breakfast., Disp: , Rfl:    prochlorperazine  (COMPAZINE ) 10 MG tablet, Take 1 tablet (10 mg total) by mouth every 6 (six) hours as needed for nausea or vomiting., Disp: 30 tablet, Rfl: 0   Accu-Chek Softclix Lancets lancets, Use daily before breakfast., Disp: 100 each, Rfl: 0   alfuzosin  (UROXATRAL ) 10 MG 24 hr tablet, Take 1 tablet (10 mg total) by mouth daily.,  Disp: 90 tablet, Rfl: 0   amoxicillin -clavulanate (AUGMENTIN ) 875-125 MG tablet, Take 1 tablet (875 mg total) by mouth every 12 (twelve) hours for 120 days, Disp: 60 tablet, Rfl: 3   Blood Glucose Monitoring Suppl (ACCU-CHEK GUIDE) w/Device KIT, Use daily before breakfast., Disp: 1 kit, Rfl: 0   CINNAMON PO, Take 1 capsule by mouth daily., Disp: , Rfl:    glucose blood (ACCU-CHEK GUIDE TEST) test strip, Use daily before breakfast., Disp: 50 each, Rfl: 0   Glucose Blood (BLOOD GLUCOSE TEST STRIPS) STRP, Use to check blood sugar before breakfast as directed, Disp: 100 strip, Rfl: 0   predniSONE  (DELTASONE ) 1 MG tablet, Take 3 tablets by mouth daily, along with a 5mg  tablet for a total of 8mg  daily for 1 month. Taper by 1mg  monthly. (Patient not taking: Reported on 10-24-2023), Disp: 90 tablet, Rfl: 0   predniSONE  (DELTASONE ) 20 MG tablet, Take 3 tablets (60 mg total) by mouth daily with breakfast. Take for 5 days starting day one of chemotherapy (Patient not taking: Reported on 10-24-2023), Disp: 15 tablet, Rfl: 5   Probiotic TBEC, Take 1 capsule by mouth daily., Disp: , Rfl:   Vitals   Vitals:   10/02/23 0000 10/02/23 0100 10/02/23 0400 10/02/23 0839  BP:  (!) 159/88 (!) 161/91 (!) 168/113  Pulse: 90 91 93  85  Resp:  16 17 20   Temp:  98 F (36.7 C) 98.1 F (36.7 C) 98 F (36.7 C)  TempSrc:  Oral Oral Oral  SpO2: 97% 96% 100% 98%  Weight:      Height:        Body mass index is 31 kg/m.   Physical Exam   Constitutional: Appears well-developed and well-nourished.   Psych: Affect appropriate to situation.   Eyes: No scleral injection.   HENT: No OP obstruction.   Head: Normocephalic.   Cardiovascular: Normal rate and regular rhythm.   Respiratory: Effort normal, non-labored breathing.   GI: Soft.  No distension. There is no tenderness.   Skin: WDI.    Neurologic Examination   Mental Status -  Level of arousal and orientation to time, place, and person were intact . Language including expression, naming, repetition, comprehension was assessed and found intact . Attention span and concentration were normal . Recent and remote memory were intact . Fund of Knowledge was assessed and was intact .  Cranial Nerves II - XII - II - Visual field intact OU . III, IV, VI - Extraocular movements intact . V - Facial sensation intact bilaterally . VII - Facial movement intact bilaterally . VIII - Hearing & vestibular intact bilaterally . X - Palate elevates symmetrically . XI - Chin turning & shoulder shrug intact bilaterally . XII - Tongue protrusion intact .  Motor Strength - The patient's strength was normal in right upper and right lower, left arm 3/5, weak left grip, left leg 3/5  Bulk was normal and fasciculations were absent .   Motor Tone - Muscle tone was assessed at the neck and appendages and was normal . Sensory - decreased on left arm and left leg  Coordination - The patient had normal movements in the hands and feet with no ataxia or dysmetria.  Tremor was absent. Gait and Station - deferred.  Labs/Imaging/Neurodiagnostic studies   CBC:  Recent Labs  Lab 10/24/23 0959 10/02/23 0401  WBC 19.0* 18.3*  NEUTROABS 15.0*  --   HGB 10.3* 9.9*  HCT 32.0* 31.5*  MCV  97.9 98.1  PLT 292 284   Basic Metabolic Panel:  Lab Results  Component Value Date   NA 137 10/02/2023   K 4.1 10/02/2023   CO2 21 (L) 10/02/2023   GLUCOSE 179 (H) 10/02/2023   BUN 12 10/02/2023   CREATININE 0.95 10/02/2023   CALCIUM 9.3 10/02/2023   GFRNONAA >60 10/02/2023   GFRAA  06/17/2007    >60        The eGFR has been calculated using the MDRD equation. This calculation has not been validated in all clinical   Lipid Panel: No results found for: LDLCALC HgbA1c:  Lab Results  Component Value Date   HGBA1C 7.5 (H) 04/10/2023   Urine Drug Screen: No results found for: LABOPIA, COCAINSCRNUR, LABBENZ, AMPHETMU, THCU, LABBARB  Alcohol Level No results found for: [facility] INR  Lab Results  Component Value Date   INR 1.1 06/05/2023   APTT  Lab Results  Component Value Date   APTT 28 06/17/2007   AED levels: No results found for: PHENYTOIN, ZONISAMIDE, LAMOTRIGINE, LEVETIRACETA  CT Head without contrast(Personally reviewed):   CT angio Head and Neck with contrast(Personally reviewed): Foci of masslike soft tissue and cortical thickening involving the posterior right frontal lobe near the vertex and the left parietal lobe with local mass effect and sulcal effacement. Findings concerning for metastatic disease versus primary CNS neoplasm. Recommend MRI head with and without contrast for further evaluation.  No acute intracranial hemorrhage.  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  MRI Brain(Personally reviewed): 1. There are ill-defined lesions present within the right frontal lobe and left parietal lobe, which correspond with the areas of increased density/cortical thickening noted on the previous CT. 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 Metastatic disease is considered significantly less likely. 2.  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 It was present back in 2007, but is more conspicuous on the current exam  Neurodiagnostics rEEG:  This study is suggestive of mild diffuse encephalopathy. No seizures or epileptiform discharges were seen throughout the recording.   ASSESSMENT   DIXIE JAFRI is a 68 y.o. male  hx of monomorphic epithelialotropic intestinal T-cell lymphoma, HLD, asthma, GERD, BPH, prediabetes, OSA, depression who presents to [facility] for evaluation of left side weakness and frequent falls.  MRI brain w/wo revealed  ill-defined lesions present within the right frontal lobe and left parietal lobe.   Signed, Karna DELENA Geralds, NP [facility]  I have seen the patient and reviewed the above note.  68 year old male with a history of T-cell lymphoma with worsening left-sided weakness, neurology was consulted for consideration of lumbar puncture.  After discussion with the hospitalist, oncologist as well as neurosurgical team, the decision was made to proceed with biopsy as opposed to starting with lumbar puncture.  My suspicion is that the likelihood of getting enough cells on flow cytometry is relatively low, and I agree with this decision.    RECOMMENDATIONS  Proceed with biopsy per neurosurgery. Neurology will continue to be available as needed, please call if further questions or concerns.  ______________________________________________________________________

## 2023-10-02 NOTE — Progress Notes (Signed)
 PT Cancellation Note  Patient Details Name: Elijah [name] MRN: [id number] DOB: 1955/05/14   Cancelled Treatment:    Reason Eval/Treat Not Completed: Patient at procedure or test/unavailable (Pt off unit for biopsy. Will follow up at later date/time as pt able and schedule allows.)   Vernell KNIGHTS PT, DPT Acute Rehabilitation Services Office [phone]  10/02/23 2:41 PM

## 2023-10-02 NOTE — Progress Notes (Signed)
Patient is off the unit to OR.

## 2023-10-02 NOTE — TOC CM/SW Note (Signed)
 Transition of Care [facility]) - Inpatient Brief Assessment   Patient Details  Name: Elijah [name] MRN: [id number] Date of Birth: Mar 01, 1956  Transition of Care [facility] CM/SW Contact:    Andrez JULIANNA George, RN Phone Number: 10/02/2023, 1:41 PM   Clinical Narrative:  Elijah [name] is a 68 y.o. male with medical history significant of monomorphic epithelialotropic intestinal T-cell lymphoma-who is being co-managed by [facility] health oncology and [facility]  hematology clinic-presented to the ED with worsening left-sided weakness.   Pt is for brain bx today. Has had multiple falls at home. Will need therapy evals.  ToC following.   Transition of Care Asessment: Insurance and Status: Insurance coverage has been reviewed Patient has primary care physician: Yes Home environment has been reviewed: home with spouse   Prior/Current Home Services: No current home services Social Drivers of Health Review: SDOH reviewed no interventions necessary Readmission risk has been reviewed: Yes Transition of care needs: transition of care needs identified, TOC will continue to follow

## 2023-10-02 NOTE — Progress Notes (Signed)
 PROGRESS NOTE  Elijah [name] FMW:[id number] DOB: 1955/11/13 DOA: 10/01/2023 PCP: Seabron Lenis, MD   LOS: 1 day   Brief Narrative / Interim history: 68 y.o. male with medical history significant of monomorphic epithelialotropic intestinal T-cell lymphoma-who is being co-managed by [facility] health oncology and [facility]  hematology clinic-presented to the ED with worsening left-sided weakness.  She has finished 6 cycles of chemotherapy at the beginning of June, had a follow-up PET/CT at [facility] that showed remission.  Over the last week, more so in the last few days has been having left-sided weakness.  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  Subjective / 24h Interval events: He is feeling better this morning, no nausea or vomiting.  Wife is at bedside.  He tells me he is hungry.  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  Oncology, neurology and neurosurgery all consulted.  A brain biopsy was entertained however per neurosurgery there would prefer to sample the CSF fluid.  Neurology consulted today, appreciate input, discussed with Dr. Michaela - For now continue Decadron  - EEG of the brain was without seizures epileptiform discharges  Active problems Monomorphic epithelialotropic intestinal T cell lymphoma -comanaged by [facility] health oncology and [facility].  Appreciate hematology follow-up  Perinephric hematoma-possibly related to trauma, urology evaluated patient, for now continue supportive management.  Monitor hemoglobin and renal function  History of PMR-on chronic prednisone , now on IV Decadron   Leukocytosis-possibly related to steroids  Normocytic anemia-possibly related to underlying malignancy  Vomiting-CT abdomen pelvis without any traction/ileus.  This is resolved now  Essential hypertension-has  had numerous side effects from multiple antihypertensives.  Monitor for now  Scheduled Meds:  alfuzosin   10 mg Oral Daily   dexamethasone   10 mg Intravenous Q8H   finasteride   5 mg Oral Daily   loratadine   10 mg Oral Daily   pantoprazole  (PROTONIX ) IV  40 mg Intravenous Q12H   polyethylene glycol  17 g Oral Daily   Continuous Infusions: PRN Meds:.acetaminophen  **OR** acetaminophen , albuterol , labetalol, morphine  injection, ondansetron  **OR** ondansetron  (ZOFRAN ) IV, oxyCODONE , prochlorperazine   Current Outpatient Medications  Medication Instructions   Accu-Chek Softclix Lancets lancets Use daily before breakfast.   albuterol  (VENTOLIN  HFA) 108 (90 Base) MCG/ACT inhaler 2 puffs, Inhalation, Every 4 hours PRN   alfuzosin  (UROXATRAL ) 10 mg, Oral, Daily   allopurinol  (ZYLOPRIM ) 300 mg, Oral, Daily   amoxicillin -clavulanate (AUGMENTIN ) 875-125 MG tablet Take 1 tablet (875 mg total) by mouth every 12 (twelve) hours for 120 days   Blood Glucose Monitoring Suppl (ACCU-CHEK GUIDE) w/Device KIT Use daily before breakfast.   CINNAMON PO 1 capsule, Daily   DULoxetine  (CYMBALTA ) 30 mg, Oral, Daily   finasteride  (PROSCAR ) 5 mg, Oral, Daily   glucose blood (ACCU-CHEK GUIDE TEST) test strip Use daily before breakfast.   Glucose Blood (BLOOD GLUCOSE TEST STRIPS) STRP Use to check blood sugar before breakfast as directed   lidocaine -prilocaine  (EMLA ) cream 1 Application, Topical, As needed   loratadine  (CLARITIN ) 10 mg, Daily   methocarbamol  (ROBAXIN ) 1,000 mg, Oral, Every 8 hours PRN   ondansetron  (ZOFRAN ) 8 mg, Oral, Every 8 hours PRN   oxyCODONE  (OXY IR/ROXICODONE ) 5-10 mg, Oral, Every 6 hours PRN   pantoprazole  (PROTONIX ) 40 MG tablet Take 1 tablet (40 mg total) by mouth daily 30 minutes to 1 hour before morning meal.   predniSONE  (DELTASONE ) 1 MG tablet  Take 3 tablets by mouth daily, along with a 5mg  tablet for a total of 8mg  daily for 1 month. Taper by 1mg  monthly.   predniSONE  (DELTASONE ) 60  mg, Oral, Daily with breakfast, Take for 5 days starting day one of chemotherapy   predniSONE  (DELTASONE ) 5 mg, Oral, Daily with breakfast   Probiotic TBEC 1 capsule, Daily   prochlorperazine  (COMPAZINE ) 10 mg, Oral, Every 6 hours PRN    Diet Orders (From admission, onward)     Start     Ordered   10/02/23 0742  Diet regular Fluid consistency: Thin  Diet effective now       Question:  Fluid consistency:  Answer:  Thin   10/02/23 0741            DVT prophylaxis: SCDs Start: 10/01/23 1724   Lab Results  Component Value Date   PLT 284 10/02/2023      Code Status: Full Code  Family Communication: wife at bedside   Status is: Inpatient Remains inpatient appropriate because: severity of illness  Level of care: Telemetry Medical  Consultants:  Oncology Neurology Neurosurgery Neurology Trauma surgery  Objective: Vitals:   10/02/23 0000 10/02/23 0100 10/02/23 0400 10/02/23 0839  BP:  (!) 159/88 (!) 161/91 (!) 168/113  Pulse: 90 91 93 85  Resp:  16 17 20   Temp:  98 F (36.7 C) 98.1 F (36.7 C) 98 F (36.7 C)  TempSrc:  Oral Oral Oral  SpO2: 97% 96% 100% 98%  Weight:      Height:        Intake/Output Summary (Last 24 hours) at 10/02/2023 9093 Last data filed at 10/01/2023 2300 Gross per 24 hour  Intake --  Output 350 ml  Net -350 ml   Wt Readings from Last 3 Encounters:  10/01/23 106.6 kg  09/08/23 109 kg  08/19/23 107.4 kg    Examination:  Constitutional: NAD Eyes: no scleral icterus ENMT: Mucous membranes are moist.  Neck: normal, supple Respiratory: clear to auscultation bilaterally, no wheezing, no crackles.  Cardiovascular: Regular rate and rhythm, no murmurs / rubs / gallops. No LE edema.  Abdomen: non distended, no tenderness. Bowel sounds positive.  Musculoskeletal: no clubbing / cyanosis. Neuro: left-sided weakness, upper and lower extremities   Data Reviewed: I have independently reviewed following labs and imaging studies    CBC Recent Labs  Lab 10/01/23 0959 10/02/23 0401  WBC 19.0* 18.3*  HGB 10.3* 9.9*  HCT 32.0* 31.5*  PLT 292 284  MCV 97.9 98.1  MCH 31.5 30.8  MCHC 32.2 31.4  RDW 16.5* 16.0*  LYMPHSABS 0.8  --   MONOABS 2.8*  --   EOSABS 0.2  --   BASOSABS 0.1  --     Recent Labs  Lab 10/01/23 0959 10/02/23 0401  NA 141 137  K 3.5 4.1  CL 108 104  CO2 24 21*  GLUCOSE 141* 179*  BUN 16 12  CREATININE 1.07 0.95  CALCIUM 9.1 9.3  AST 16 14*  ALT 25 21  ALKPHOS 61 60  BILITOT 0.6 0.6  ALBUMIN  3.4* 3.3*  MG 1.8  --     ------------------------------------------------------------------------------------------------------------------ No results for input(s): CHOL, HDL, LDLCALC, TRIG, CHOLHDL, LDLDIRECT in the last 72 hours.  Lab Results  Component Value Date   HGBA1C 7.5 (H) 04/10/2023   ------------------------------------------------------------------------------------------------------------------ No results for input(s): TSH, T4TOTAL, T3FREE, THYROIDAB in the last 72 hours.  Invalid input(s): FREET3  Cardiac Enzymes No results for input(s): CKMB, TROPONINI, MYOGLOBIN in the last  168 hours.  Invalid input(s): CK ------------------------------------------------------------------------------------------------------------------ No results found for: BNP  CBG: No results for input(s): GLUCAP in the last 168 hours.  No results found for this or any previous visit (from the past 240 hours).   Radiology Studies: MR Brain W and Wo Contrast Result Date: 10/01/2023 CLINICAL DATA:  Metastatic disease evaluation brain met on ct EXAM: MRI HEAD WITHOUT AND WITH CONTRAST TECHNIQUE: Multiplanar, multiecho pulse sequences of the brain and surrounding structures were obtained without and with intravenous contrast. CONTRAST:  10mL GADAVIST GADOBUTROL 1 MMOL/ML IV SOLN COMPARISON:  CT of the head dated October 01, 2023 and MRI of the brain dated Aug 19, 2005.  FINDINGS: Brain: There is an amorphous area of enhancement present within the right posterior frontal lobe, that corresponds with the area of abnormally increased density noted on the previous CT. There is also a similar area of abnormal enhancement and surrounding edema within the left parietal lobe in the region of increased density and sulcal effacement. There is also a small focal area of irregular enhancement within the left frontal centrum semiovale, which appears to be related to a developmental venous anomaly. There is increased T2 signal and sulcal effacement involving the right frontal lobe and left parietal lesions. Both lesions are mildly hyperintense on diffusion. 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 Vascular: There are normal vascular flow voids present. Skull and upper cervical spine: Normal marrow signal. No osseous lesions. Sinuses/Orbits: Clear paranasal sinuses.  Normal orbits. Other: None. IMPRESSION: 1. There are ill-defined lesions present within the right frontal lobe and left parietal lobe, which correspond with the areas of increased density/cortical thickening noted on the previous CT. 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 Metastatic disease is considered significantly less likely. 2. 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 It was present back in 2007, but is more conspicuous on the current exam. Electronically Signed   By: Evalene Coho M.D.   On: 10/01/2023 15:26   CT ABDOMEN PELVIS W CONTRAST Result Date: 10/01/2023 CLINICAL DATA:  Abdominal pain, left-sided weakness history of T-cell lymphoma * Tracking Code: BO * EXAM: CT ABDOMEN AND PELVIS WITH CONTRAST CT LUMBAR SPINE WITH CONTRAST  TECHNIQUE: Multidetector CT imaging of the abdomen and pelvis was performed using the standard protocol following bolus administration of intravenous contrast. Multidetector CT imaging of the lumbar spine was performed using the standard protocol following bolus administration of intravenous contrast. RADIATION DOSE REDUCTION: This exam was performed according to the departmental dose-optimization program which includes automated exposure control, adjustment of the mA and/or kV according to patient size and/or use of iterative reconstruction technique. CONTRAST:  75mL OMNIPAQUE  IOHEXOL  350 MG/ML SOLN COMPARISON:  07/25/2023 FINDINGS: CT ABDOMEN PELVIS FINDINGS Lower chest: No acute findings. Hepatobiliary: No solid liver abnormality is seen. Hepatic steatosis. No gallstones, gallbladder wall thickening, or biliary dilatation. Pancreas: Unremarkable. No pancreatic ductal dilatation or surrounding inflammatory changes. Spleen: Normal in size without significant abnormality. Adrenals/Urinary Tract: Adrenal glands are unremarkable. 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 Nonobstructive calculus of the inferior pole of the left kidney. Mild left hydronephrosis, the proximal left ureter appears to  be compressed by overlying hemorrhage. No ureteral calculus or other obstruction to the ureterovesicular junction. Simple benign right renal cortical cyst, for which no further follow-up or characterization is required. Right kidney is otherwise normal, without renal calculi, solid lesion, or hydronephrosis. Bladder is unremarkable. Stomach/Bowel: Stomach is within normal limits. Appendix appears normal. No evidence of bowel wall thickening, distention, or inflammatory changes. Vascular/Lymphatic: Aortic atherosclerosis. No enlarged abdominal or  pelvic lymph nodes. Reproductive: Prostatomegaly. Other: No abdominal wall hernia or abnormality. No ascites. Musculoskeletal: No acute osseous findings. CT LUMBAR SPINE FINDINGS Alignment: Normal lumbar lordosis. Vertebral bodies: Intact. No fracture or dislocation. Disc spaces: Focally moderate disc space height loss and osteophytosis of L5-S1 with otherwise minimal lumbar disc degenerative change. Mild facet degenerative change of the lower lumbar levels. Paraspinous soft tissues: Unremarkable. IMPRESSION: 1. 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 2. Mild left hydronephrosis, the proximal left ureter appears to be compressed by overlying hemorrhage. No ureteral calculus or other obstruction to the ureterovesicular junction. 3. Nonobstructive calculus of the inferior pole of the left kidney. 4. Hepatic steatosis. 5. Prostatomegaly. 6. No fracture or dislocation of the lumbar spine. 7. Focally moderate disc degenerative change at L5-S1. Aortic Atherosclerosis (ICD10-I70.0). Electronically Signed   By: Marolyn JONETTA Jaksch M.D.   On: 10/01/2023 12:52   CT L-SPINE NO CHARGE Result Date: 10/01/2023 CLINICAL DATA:  Abdominal pain, left-sided weakness history of T-cell lymphoma * Tracking Code: BO * EXAM: CT ABDOMEN AND PELVIS WITH CONTRAST CT LUMBAR SPINE WITH CONTRAST TECHNIQUE: Multidetector CT imaging of the abdomen and pelvis was performed using the standard protocol following bolus administration of intravenous contrast. Multidetector CT imaging of the lumbar spine was performed using the standard protocol following bolus administration of intravenous contrast. RADIATION DOSE REDUCTION: This exam was performed according to the departmental dose-optimization program which includes automated exposure control, adjustment of the mA and/or kV according to patient  size and/or use of iterative reconstruction technique. CONTRAST:  75mL OMNIPAQUE  IOHEXOL  350 MG/ML SOLN COMPARISON:  07/25/2023 FINDINGS: CT ABDOMEN PELVIS FINDINGS Lower chest: No acute findings. Hepatobiliary: No solid liver abnormality is seen. Hepatic steatosis. No gallstones, gallbladder wall thickening, or biliary dilatation. Pancreas: Unremarkable. No pancreatic ductal dilatation or surrounding inflammatory changes. Spleen: Normal in size without significant abnormality. Adrenals/Urinary Tract: Adrenal glands are unremarkable. 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 Nonobstructive calculus of the inferior pole of the left kidney. Mild left hydronephrosis, the proximal left ureter appears to be compressed by overlying hemorrhage. No ureteral calculus or other obstruction to the ureterovesicular junction. Simple benign right renal cortical cyst, for which no further follow-up or characterization is required. Right kidney is otherwise normal, without renal calculi, solid lesion, or hydronephrosis. Bladder is unremarkable. Stomach/Bowel: Stomach is within normal limits. Appendix appears normal. No evidence of bowel wall thickening, distention, or inflammatory changes. Vascular/Lymphatic: Aortic atherosclerosis. No enlarged abdominal or pelvic lymph nodes. Reproductive: Prostatomegaly. Other: No abdominal wall hernia or abnormality. No ascites. Musculoskeletal: No acute osseous findings. CT LUMBAR SPINE FINDINGS Alignment: Normal lumbar lordosis. Vertebral bodies: Intact. No fracture or dislocation. Disc spaces: Focally moderate disc space height loss and osteophytosis of L5-S1 with otherwise minimal lumbar disc degenerative change. Mild facet degenerative change of the lower lumbar levels.  Paraspinous soft tissues: Unremarkable.  IMPRESSION: 1. 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 2. Mild left hydronephrosis, the proximal left ureter appears to be compressed by overlying hemorrhage. No ureteral calculus or other obstruction to the ureterovesicular junction. 3. Nonobstructive calculus of the inferior pole of the left kidney. 4. Hepatic steatosis. 5. Prostatomegaly. 6. No fracture or dislocation of the lumbar spine. 7. Focally moderate disc degenerative change at L5-S1. Aortic Atherosclerosis (ICD10-I70.0). Electronically Signed   By: Marolyn JONETTA Jaksch M.D.   On: 10/01/2023 12:52   DG Femur Min 2 Views Left Result Date: 10/01/2023 CLINICAL DATA:  fall, left thigh pain EXAM: LEFT FEMUR 2 VIEWS COMPARISON:  None Available. FINDINGS: No acute fracture or dislocation. There is no evidence of arthropathy or other focal bone abnormality. Peripheral vascular atherosclerosis. Couple of surgical clips are noted in the proximal and medial thigh. IMPRESSION: No acute fracture or dislocation. Electronically Signed   By: Rogelia Myers M.D.   On: 10/01/2023 12:27   CT Head Wo Contrast Result Date: 10/01/2023 CLINICAL DATA:  Neuro deficit, concern for stroke, left-sided weakness for 3 weeks. EXAM: CT HEAD WITHOUT CONTRAST TECHNIQUE: Contiguous axial images were obtained from the base of the skull through the vertex without intravenous contrast. RADIATION DOSE REDUCTION: This exam was performed according to the departmental dose-optimization program which includes automated exposure control, adjustment of the mA and/or kV according to patient size and/or use of iterative reconstruction technique. COMPARISON:  CT head 06/17/2007. FINDINGS: Brain: Abnormal appearance of cortex within the right frontoparietal lobes near the vertex with suggestion of cortical thickening and mild hyperattenuation.  There is associated sulcal effacement and local mass effect. Possible mild vasogenic edema in the posterior right frontal lobe. No definite encephalomalacia. Additional region of masslike soft tissue and possible cortical thickening involving the posterior left parietal lobe. No acute intracranial hemorrhage. Possible remote lacunar infarcts in the right caudate head and right anterior limb of the internal capsule. Nonspecific hypoattenuation in the periventricular and subcortical white matter favored to reflect chronic microvascular ischemic changes. Ventricles are unremarkable. No midline shift. Basilar cisterns are patent. Vascular: No hyperdense vessel or unexpected calcification. Skull: Normal. Negative for fracture or focal lesion. Sinuses/Orbits: No acute finding. Other: None. IMPRESSION: Foci of masslike soft tissue and cortical thickening involving the posterior right frontal lobe near the vertex and the left parietal lobe with local mass effect and sulcal effacement. Findings concerning for metastatic disease versus primary CNS neoplasm. Recommend MRI head with and without contrast for further evaluation. No acute intracranial hemorrhage. 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 Electronically Signed   By: Donnice Mania M.D.   On: 10/01/2023 12:16   DG Chest 1 View Result Date: 10/01/2023 CLINICAL DATA:  Pain after fall EXAM: CHEST  1 VIEW portable semi upright COMPARISON:  X-ray 06/03/2023. FINDINGS: No consolidation, pneumothorax or effusion. Normal cardiopericardial silhouette. Right IJ chest port with tip along the right atrium. Film is under penetrated. Overlapping cardiac leads. IMPRESSION: No acute cardiopulmonary disease.  Chest port. Electronically Signed   By: Ranell Bring M.D.   On: 10/01/2023 11:56     Nilda Fendt, MD, PhD [facility]  Between 7 am - 7 pm I am available, please contact me via Amion (for emergencies) or  Securechat (non urgent messages)  Between 7 pm - 7 am I  am not available, please contact night coverage MD/APP via Amion

## 2023-10-02 NOTE — Consult Note (Signed)
 NAME:  Elijah [name], MRN:  [id number], DOB:  [birth year], LOS: 1 ADMISSION DATE:  10/01/2023, CONSULTATION DATE:  10/02/23 REFERRING MD:  neurosx, CHIEF COMPLAINT:  post operative from intracranial mass   History of Present Illness:  68 yo with h/o lymphoma following with [facility] and [facility] oncology/heme presented with L sided weakness. Pt had recently completed 6 cycles of chemo on 6/9 and repeat imaging was revealing remission. Family stated that during his chemo pt would have intermittent jerking that would self resolve. He also had complaints of generalized fatigue and weakness that was thought to be secondary to medications and treatment. However, over the past 2-3 days he began having progressive L sided weakness with 4 falls as well. One fall resulted in large flank hematoma.   Pt presented to ED where [facility] revealed R frontal/L parietal lesions and CT abd pelvis revealed large perinephric hematoma with mild L hydronephrosis.   Pt additionally endorses n/v over the course of those 2-3 days. Denied diarrhea, fever/chills, pain other than L flank from bruise.   He was admitted to [facility] and neurosx consulted as well as urology. He was increased on decadron  and ultimately underwent stereotactic brain bx. Urology recommended repeat imaging in 4 months.   CCM was consulted per routine co-management protocol with neurosx. At this time pt is sleeping comfortably after being agitated post operatively. Wife at bedside says he was restless and pulling at lines and clothing and is now resting comfortably.  Pertinent  Medical History  Lymphoma Asthma Gerd Bph Hyperlipidemia Depression Osa Polymyalgia rheumatica  Significant Hospital Events: Including procedures, antibiotic start and stop dates in addition to other pertinent events   Admitted to [facility] 7/2 Brain bx 7/3  Interim History / Subjective:    Objective    Blood pressure (!) 155/95, pulse 90, temperature 98.6 F (37 C), temperature source  Oral, resp. rate 16, height 6' 1 (1.854 m), weight 106.6 kg, SpO2 96%.        Intake/Output Summary (Last 24 hours) at 10/02/2023 2140 Last data filed at 10/02/2023 2000 Gross per 24 hour  Intake 14.83 ml  Output 660 ml  Net -645.17 ml   Filed Weights   10/01/23 0952  Weight: 106.6 kg    Examination: General: resting comfortably in nad HENT: Burney, perrla, mmmp Lungs: ctab Cardiovascular: rrr Abdomen: soft nd,nt Extremities: no c/c/e  Neuro: arousable, L weakness GU: deferred  Resolved problem list   Assessment and Plan  Post operative medical management Agitation -prn atarax  or benadryl  -avoid benzo if possible to prevent clouding of neuro exam L parietal mass R frontal mass Lymphoma Perinephric hematoma -cont steroid -follow pathology -per neurosx -f/u with uro as outpt N/v -supportive care  Best Practice (right click and Reselect all SmartList Selections daily)   Diet/type: Regular consistency (see orders) DVT prophylaxis SCD Pressure ulcer(s): present on admission  GI prophylaxis: N/A Lines: N/A Foley:  N/A Code Status:  full code Last date of multidisciplinary goals of care discussion [per primary]  Labs   CBC: Recent Labs  Lab 10/01/23 0959 10/02/23 0401  WBC 19.0* 18.3*  NEUTROABS 15.0*  --   HGB 10.3* 9.9*  HCT 32.0* 31.5*  MCV 97.9 98.1  PLT 292 284    Basic Metabolic Panel: Recent Labs  Lab 10/01/23 0959 10/02/23 0401  NA 141 137  K 3.5 4.1  CL 108 104  CO2 24 21*  GLUCOSE 141* 179*  BUN 16 12  CREATININE 1.07 0.95  CALCIUM 9.1 9.3  MG 1.8  --    GFR: Estimated Creatinine Clearance: 98 mL/min (by C-G formula based on SCr of 0.95 mg/dL). Recent Labs  Lab 10/01/23 0959 10/02/23 0401  WBC 19.0* 18.3*    Liver Function Tests: Recent Labs  Lab 10/01/23 0959 10/02/23 0401  AST 16 14*  ALT 25 21  ALKPHOS 61 60  BILITOT 0.6 0.6  PROT 6.4* 6.5  ALBUMIN  3.4* 3.3*   No results for input(s): LIPASE, AMYLASE in the  last 168 hours. No results for input(s): AMMONIA in the last 168 hours.  ABG    Component Value Date/Time   HCO3 24.6 (H) 06/17/2007 2151   TCO2 26 06/17/2007 2151     Coagulation Profile: No results for input(s): INR, PROTIME in the last 168 hours.  Cardiac Enzymes: Recent Labs  Lab 10/01/23 0959  CKTOTAL 101    HbA1C: Hgb A1c MFr Bld  Date/Time Value Ref Range Status  04/10/2023 08:53 PM 7.5 (H) 4.8 - 5.6 % Final    Comment:    (NOTE) Pre diabetes:          5.7%-6.4%  Diabetes:              >6.4%  Glycemic control for   <7.0% adults with diabetes     CBG: No results for input(s): GLUCAP in the last 168 hours.  Review of Systems:   As per HPI  Past Medical History:  He,  has a past medical history of Acid reflux, Allergic rhinitis, Asthma, Bloating, BPH (benign prostatic hyperplasia), Depression, ED (erectile dysfunction), Epigastric pain, Exercise-induced asthma, GERD (gastroesophageal reflux disease), History of kidney stones, Hypercholesteremia, Migraine (2009), Polymyalgia rheumatica (HCC), Prediabetes, Prostate disease, and Sleep apnea.   Surgical History:   Past Surgical History:  Procedure Laterality Date   ANKLE SURGERY Right    BOWEL RESECTION N/A 04/10/2023   Procedure: SMALL BOWEL RESECTION WITH MASS;  Surgeon: Polly Cordella LABOR, MD;  Location: [facility] OR;  Service: General;  Laterality: N/A;   CHEST TUBE INSERTION Left    COLONOSCOPY     CYSTOSCOPY WITH RETROGRADE PYELOGRAM, URETEROSCOPY AND STENT PLACEMENT Left 06/29/2020   Procedure: CYSTOSCOPY WITH RETROGRADE PYELOGRAM, URETEROSCOPY , BASKET EXTRACTION, AND STENT PLACEMENT;  Surgeon: Alvaro Hummer, MD;  Location: [facility] ORS;  Service: Urology;  Laterality: Left;  1 HR   IR CV LINE INJECTION  07/25/2023   IR IMAGING GUIDED PORT INSERTION  05/13/2023   KNEE SURGERY Right    LAPAROTOMY N/A 04/10/2023   Procedure: EXPLORATORY LAPAROTOMY;  Surgeon: Polly Cordella LABOR, MD;  Location: [facility] OR;   Service: General;  Laterality: N/A;   SKIN CANCER EXCISION     unsure   TONSILLECTOMY     VASECTOMY     WISDOM TOOTH EXTRACTION       Social History:   reports that he has never smoked. He has never been exposed to tobacco smoke. He has never used smokeless tobacco. He reports current alcohol use. He reports that he does not use drugs.   Family History:  His family history includes Healthy in his daughter and son.   Allergies Allergies  Allergen Reactions   Singulair [Montelukast] Hives   Amlodipine  Other (See Comments)    agitation   Crestor [Rosuvastatin] Other (See Comments)    Muscle Aches      Home Medications  Prior to Admission medications   Medication Sig Start Date End Date Taking? Authorizing Provider  albuterol  (VENTOLIN  HFA) 108 (90 Base) MCG/ACT inhaler Inhale 2 puffs into  the lungs every 4 (four) hours as needed. 09/15/23  Yes   allopurinol  (ZYLOPRIM ) 300 MG tablet Take 1 tablet (300 mg total) by mouth daily. 05/19/23  Yes Federico Norleen ONEIDA MADISON, MD  DULoxetine  (CYMBALTA ) 30 MG capsule Take 1 capsule (30 mg total) by mouth daily. 09/15/23  Yes Federico Norleen ONEIDA MADISON, MD  finasteride  (PROSCAR ) 5 MG tablet Take 1 tablet (5 mg total) by mouth daily. 02/11/23  Yes   lidocaine -prilocaine  (EMLA ) cream Apply 1 Application topically as needed. Patient taking differently: Apply 1 Application topically as needed (for port). 05/19/23  Yes Federico Norleen ONEIDA MADISON, MD  loratadine  (CLARITIN ) 10 MG tablet Take 10 mg by mouth daily.   Yes [provider]  methocarbamol  (ROBAXIN ) 500 MG tablet Take 2 tablets (1,000 mg total) by mouth every 8 (eight) hours as needed for muscle spasms. 06/16/23  Yes Federico Norleen ONEIDA MADISON, MD  ondansetron  (ZOFRAN ) 8 MG tablet Take 1 tablet (8 mg total) by mouth every 8 (eight) hours as needed. 06/17/23  Yes Federico Norleen ONEIDA MADISON, MD  oxyCODONE  (OXY IR/ROXICODONE ) 5 MG immediate release tablet Take 1-2 tablets (5-10 mg total) by mouth every 6 (six) hours as needed for severe  pain (pain score 7-10). 04/23/23  Yes Federico Norleen ONEIDA MADISON, MD  pantoprazole  (PROTONIX ) 40 MG tablet Take 1 tablet (40 mg total) by mouth daily 30 minutes to 1 hour before morning meal. 05/01/23  Yes   predniSONE  (DELTASONE ) 5 MG tablet Take 5 mg by mouth daily with breakfast.   Yes [provider]  prochlorperazine  (COMPAZINE ) 10 MG tablet Take 1 tablet (10 mg total) by mouth every 6 (six) hours as needed for nausea or vomiting. 07/20/23  Yes Federico Norleen ONEIDA MADISON, MD  Accu-Chek Softclix Lancets lancets Use daily before breakfast. 04/16/23   Briana Elgin LABOR, MD  alfuzosin  (UROXATRAL ) 10 MG 24 hr tablet Take 1 tablet (10 mg total) by mouth daily. 07/25/23     amoxicillin -clavulanate (AUGMENTIN ) 875-125 MG tablet Take 1 tablet (875 mg total) by mouth every 12 (twelve) hours for 120 days 06/13/23     Blood Glucose Monitoring Suppl (ACCU-CHEK GUIDE) w/Device KIT Use daily before breakfast. 04/16/23   Briana Elgin LABOR, MD  CINNAMON PO Take 1 capsule by mouth daily.    [provider]  glucose blood (ACCU-CHEK GUIDE TEST) test strip Use daily before breakfast. 04/16/23   Briana Elgin LABOR, MD  Glucose Blood (BLOOD GLUCOSE TEST STRIPS) STRP Use to check blood sugar before breakfast as directed 04/16/23   Briana Elgin LABOR, MD  predniSONE  (DELTASONE ) 1 MG tablet Take 3 tablets by mouth daily, along with a 5mg  tablet for a total of 8mg  daily for 1 month. Taper by 1mg  monthly. Patient not taking: Reported on 10/01/2023 06/16/23   Dolphus Reiter, MD  predniSONE  (DELTASONE ) 20 MG tablet Take 3 tablets (60 mg total) by mouth daily with breakfast. Take for 5 days starting day one of chemotherapy Patient not taking: Reported on 10/01/2023 06/16/23   Federico Norleen ONEIDA MADISON, MD  Probiotic TBEC Take 1 capsule by mouth daily.    [provider]     Critical care time: 

## 2023-10-02 NOTE — Progress Notes (Signed)
 OT Cancellation Note  Patient Details Name: Elijah [name] MRN: [id number] DOB: Nov 08, 1955   Cancelled Treatment:    Reason Eval/Treat Not Completed: Patient at procedure or test/ unavailable (OTF, OT to continues efforts for evaluation as able.)  [name] 10/02/2023, 10:33 AM

## 2023-10-02 NOTE — Progress Notes (Signed)
 Patient ID: SIMONE TUCKEY [birth year]  Admit date: 10/01/2023 Admitting Diagnoses: Fall/recent L sided weakness Concern for lymphomatous lesions in brain Perinephric hematoma  Hx of T cell lymphoma and bowel perforation Jan 2025   Tertiary Survey: Trauma scans reviewed: Yes Additional Imaging recommendations: No from trauma standpoint Labs reviewed: Yes Additional lab work recommendations: Yes: may need follow up UA per urology  Does the patient have any new complaints? No Cervical Collar Cleared: Yes DVT ppx ordered? No - plans for LP today  Note: dosing for DVT prophylaxis in the setting of trauma and normal renal function is 30mg  BID or 40mg  BID if >100kg or BMI>35 Is patient age > 46 (68 y.o.) : Yes  Exam: General: pleasant, WD, chronically ill appearing male who is laying in bed in NAD HEENT: head is normocephalic, atraumatic.  Sclera are noninjected. EOMI. Heart: regular, rate, and rhythm.   Lungs: Respiratory effort nonlabored Abd: soft, NT, ND, left flank ecchymosis MS: all 4 extremities are symmetrical with no cyanosis, clubbing, or edema. Psych: A&Ox3 with an appropriate affect.   Incidental Findings: concern for new lesions in brain, NS following and planning possible LP later today.   Wound Care: None  Follow-up Appointments: Pending further workup   Urology following and recommended following renal function and trending hgb, agree with this management. Recommendation for repeat CT in 4 months for new baseline. Try to avoid any further trauma to area.   No other recommendations from a trauma standpoint at this time, we are available as needed but will sign off.   Burnard JONELLE Louder, [facility] 10/02/2023, 8:59 AM Please see Amion for pager number during day hours 7:00am-4:30pm

## 2023-10-02 NOTE — ED Notes (Signed)
 Patient leaving the floor in stable condition, AOX4, with his belongings, family, and staff.  Provider at bedside. Called the floor to advise patient is in a hospital bed and would be coming to the floor soon.

## 2023-10-02 NOTE — Progress Notes (Signed)
 Patient ID: Elijah [name], male   DOB: [birth year], 68 y.o.   MRN: [id number] I was informed of the patient's presence by Dr. Joshua this morning.  Review of the patient's MRI demonstrates 2 lesions 1 in the left parietal and the other in the right posterior frontal which is causing the patient significant weakness of the upper and lower extremity on the left side the right parietal lesion is appropriate for biopsy however the patient needs a new MRI with Stealth protocol cuts of 1 mm to include the entirety of the face.  This has been ordered.  Patient has had some breakfast this morning however because of the nature of the progressive paralysis that he is evolving and the need to obtain significant tissue samples a biopsy is being proceeded with as an emergency.  Once the scans are available and stealth team is assembled we will proceed with a stereotactic needle biopsy of the left parietal lesion.

## 2023-10-02 NOTE — Procedures (Signed)
 Patient Name: Elijah [name]  MRN: [id number]  Epilepsy Attending: Arlin MALVA Krebs  Referring Physician/Provider: Raenelle Donalda HERO, MD  Date: 10/01/2023 Duration: 29.10 mins  Patient history:  68 y.o. male presented to the ED with worsening left-sided weakness.  EEG to evaluate for seizure.   Level of alertness: Awake  AEDs during EEG study: None  Technical aspects: This EEG study was done with scalp electrodes positioned according to the 10-20 International system of electrode placement. Electrical activity was reviewed with band pass filter of 1-70Hz , sensitivity of 7 uV/mm, display speed of 4mm/sec with a 60Hz  notched filter applied as appropriate. EEG data were recorded continuously and digitally stored.  Video monitoring was available and reviewed as appropriate.  Description: The posterior dominant rhythm consists of 8Hz  activity of moderate voltage (25-35 uV) seen predominantly in posterior head regions, symmetric and reactive to eye opening and eye closing. EEG showed intermittent generalized 5 to 6 Hz theta slowing. Hyperventilation and photic stimulation were not performed.     ABNORMALITY - Intermittent slow, generalized  IMPRESSION: This study is suggestive of mild diffuse encephalopathy. No seizures or epileptiform discharges were seen throughout the recording.  Maryelizabeth Eberle O Rumor Sun

## 2023-10-03 ENCOUNTER — Encounter (HOSPITAL_COMMUNITY): Payer: Self-pay | Admitting: Neurological Surgery

## 2023-10-03 DIAGNOSIS — R531 Weakness: Secondary | ICD-10-CM | POA: Diagnosis not present

## 2023-10-03 DIAGNOSIS — R451 Restlessness and agitation: Secondary | ICD-10-CM | POA: Diagnosis not present

## 2023-10-03 LAB — COMPREHENSIVE METABOLIC PANEL WITH GFR
ALT: 19 U/L (ref 0–44)
AST: 13 U/L — ABNORMAL LOW (ref 15–41)
Albumin: 3 g/dL — ABNORMAL LOW (ref 3.5–5.0)
Alkaline Phosphatase: 50 U/L (ref 38–126)
Anion gap: 9 (ref 5–15)
BUN: 16 mg/dL (ref 8–23)
CO2: 24 mmol/L (ref 22–32)
Calcium: 9.3 mg/dL (ref 8.9–10.3)
Chloride: 106 mmol/L (ref 98–111)
Creatinine, Ser: 1.09 mg/dL (ref 0.61–1.24)
GFR, Estimated: >60 mL/min (ref >60)
Glucose, Bld: 210 mg/dL — ABNORMAL HIGH (ref 70–99)
Potassium: 4.2 mmol/L (ref 3.5–5.1)
Sodium: 139 mmol/L (ref 135–145)
Total Bilirubin: 0.5 mg/dL (ref 0.0–1.2)
Total Protein: 6.2 g/dL — ABNORMAL LOW (ref 6.5–8.1)

## 2023-10-03 LAB — CBC
HCT: 28.6 % — ABNORMAL LOW (ref 39.0–52.0)
Hemoglobin: 9.1 g/dL — ABNORMAL LOW (ref 13.0–17.0)
MCH: 30.7 pg (ref 26.0–34.0)
MCHC: 31.8 g/dL (ref 30.0–36.0)
MCV: 96.6 fL (ref 80.0–100.0)
Platelets: 263 K/uL (ref 150–400)
RBC: 2.96 MIL/uL — ABNORMAL LOW (ref 4.22–5.81)
RDW: 16 % — ABNORMAL HIGH (ref 11.5–15.5)
WBC: 14.7 K/uL — ABNORMAL HIGH (ref 4.0–10.5)
nRBC: 0 % (ref 0.0–0.2)

## 2023-10-03 LAB — MAGNESIUM: Magnesium: 2 mg/dL (ref 1.7–2.4)

## 2023-10-03 MED ORDER — ORAL CARE MOUTH RINSE: 15.0000 mL | OROMUCOSAL | Status: DC | PRN

## 2023-10-03 MED ORDER — DEXAMETHASONE 2 MG PO TABS
2.0000 mg | ORAL_TABLET | Freq: Two times a day (BID) | ORAL | 1 refills | Status: DC
  Filled 2023-10-03: qty 60, 30d supply, fill #0

## 2023-10-03 MED ORDER — DEXAMETHASONE 4 MG PO TABS
10.0000 mg | ORAL_TABLET | Freq: Three times a day (TID) | ORAL | Status: DC
  Administered 2023-10-03: 10 mg via ORAL
  Filled 2023-10-03: qty 3

## 2023-10-03 MED ORDER — ALPRAZOLAM 0.5 MG PO TABS
0.5000 mg | ORAL_TABLET | Freq: Two times a day (BID) | ORAL | 0 refills | Status: DC | PRN
  Filled 2023-10-03: qty 60, 30d supply, fill #0

## 2023-10-03 MED ORDER — CHLORHEXIDINE GLUCONATE CLOTH 2 % EX PADS: 6.0000 | MEDICATED_PAD | Freq: Every day | CUTANEOUS | Status: DC

## 2023-10-03 MED ORDER — HYDROXYZINE HCL 25 MG PO TABS: 25.0000 mg | ORAL_TABLET | ORAL | Status: DC | PRN

## 2023-10-03 NOTE — Evaluation (Signed)
 Occupational Therapy Evaluation Patient Details Name: Elijah [name] MRN: [id number] DOB: December 15, 1955 Today's Date: 10/03/2023   History of Present Illness   Patient is 68 y.o. male presented to the ED with worsening left-sided weakness over the last week. Medical history significant of monomorphic epithelialotropic intestinal T-cell lymphoma-who is being co-managed by [facility] health oncology and [facility]  hematology clinic.  finished 6 cycles of chemotherapy at the beginning of June, had a follow-up PET/CT at [facility] that showed remission. In ED neuroimaging showed ill-defined lesions of the right frontal left parietal lobe, concerning for CNS lymphoma. Additional PMH significant for polymyalgia rheumatica, BPH, GERD, DM, asthma.     Clinical Impressions Patient admitted for the diagnosis above.  PTA he lives at home with his spouse, and was independent with all ADL,iADL and mobility, but over the last few weeks, he has been having increasing weakness to his left hemibody, impacting the upper extremity more than the leg.  Currently he is limited by the deficits listed below, needing up to Min A for sit to stand, and Min A for simple transfers in the room.  In addition, he is needing up to Mod A for lower body ADL, and will need setup for self feeding and grooming given L hand dysfunction.  Spouse is available 24 hours/day, and HH OT would be recommended at home.  Patient will need a PFRW and shower chair at home.  OT will continue efforts in the acute setting to address deficits.     If plan is discharge home, recommend the following:   Help with stairs or ramp for entrance;A lot of help with bathing/dressing/bathroom;A little help with walking and/or transfers;Assist for transportation;Assistance with cooking/housework     Functional Status Assessment   Patient has had a recent decline in their functional status and demonstrates the ability to make significant improvements in function  in a reasonable and predictable amount of time.     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 Restrictions Weight Bearing Restrictions Per Provider Order: No     Mobility Bed Mobility Overal bed mobility: Needs Assistance Bed Mobility: Sit to Supine       Sit to supine: Mod assist, Min assist        Transfers Overall transfer level: Needs assistance Equipment used: None Transfers: Sit to/from Stand, Bed to chair/wheelchair/BSC Sit to Stand: Min assist     Step pivot transfers: Min assist            Balance                                           ADL either performed or assessed with clinical judgement   ADL       Grooming: Minimal assistance;Standing           Upper Body Dressing : Minimal assistance;Sitting;Cueing for compensatory techniques   Lower Body Dressing: Moderate assistance;Sit to/from stand   Toilet Transfer: Minimal Holiday representative;Ambulation                   Vision   Vision Assessment?: No apparent visual deficits     Perception Perception: Impaired Preception Impairment Details: Inattention/Neglect Perception-Other Comments: ? L neglect   Praxis Praxis: [facility]  Pertinent Vitals/Pain Pain Assessment Pain Assessment: No/denies pain     Extremity/Trunk Assessment Upper Extremity Assessment Upper Extremity Assessment: Right hand dominant;LUE deficits/detail LUE Deficits / Details: No AROM to hand, wrist and forearm.  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)   Cervical / Trunk Assessment Cervical / Trunk Assessment: Normal   Communication Communication Communication: No apparent difficulties   Cognition Arousal: Alert Behavior During Therapy: WFL for tasks assessed/performed Cognition: Cognition impaired         Attention impairment (select first level of impairment): Selective attention Executive functioning impairment (select all impairments): Reasoning, Problem solving                   Following commands: Intact       Cueing  General Comments   Cueing Techniques: Verbal cues;Gestural cues  L hand edema   Exercises     Shoulder Instructions      Home Living Family/patient expects to be discharged to:: Private residence Living Arrangements: Spouse/significant other Available Help at Discharge: Available 24 hours/day;Family Type of Home: House Home Access: Stairs to enter [employer] of Steps: 3 Entrance Stairs-Rails: None Home Layout: Two level Alternate Level Stairs-Number of Steps: 10 Alternate Level Stairs-Rails: Left Bathroom Shower/Tub: Tub/shower unit;Walk-in shower   Bathroom Toilet: Standard Bathroom Accessibility: Yes How Accessible: Accessible via walker Home Equipment: None   Additional Comments: bedroom upstairs but may sleep in recliner downstairs.      Prior Functioning/Environment Prior Level of Function : Independent/Modified Independent             Mobility Comments: retired ADLs Comments: wife helping with LB dressing    OT Problem List: Decreased strength;Decreased range of motion;Decreased activity tolerance;Impaired balance (sitting and/or standing);Impaired UE functional use;Decreased safety awareness;Decreased coordination   OT Treatment/Interventions: Self-care/ADL training;Therapeutic activities;Patient/family education;Balance training;DME  and/or AE instruction      OT Goals(Current goals can be found in the care plan section)   Acute Rehab OT Goals Patient Stated Goal: Return home OT Goal Formulation: With patient Time For Goal Achievement: 10/17/23 Potential to Achieve Goals: Good ADL Goals Pt Will Perform Grooming: with modified independence;standing Pt Will Perform Upper Body Dressing: with modified independence;sitting Pt Will Perform Lower Body Dressing: with min assist;sit to/from stand Pt Will Transfer to Toilet: with modified independence;ambulating;regular height toilet   OT Frequency:  Min 2X/week    Co-evaluation              AM-PAC OT 6 Clicks Daily Activity     Outcome Measure Help from another person eating meals?: None Help from another person taking care of personal grooming?: A Little Help from another person toileting, which includes using toliet, bedpan, or urinal?: A Little Help from another person bathing (including washing, rinsing, drying)?: A Lot Help from another person to put on and taking off regular upper body clothing?: A Little Help from another person to put on and taking off regular lower body clothing?: A Lot 6 Click Score: 17   End of Session Equipment Utilized During Treatment: Gait belt;Rolling walker (2 wheels) Nurse Communication: Mobility status  Activity Tolerance: Patient tolerated treatment well Patient left: in bed;with call bell/phone within  reach  OT Visit Diagnosis: Unsteadiness on feet (R26.81);Muscle weakness (generalized) (M62.81);Hemiplegia and hemiparesis Hemiplegia - Right/Left: Left Hemiplegia - dominant/non-dominant: Non-Dominant Hemiplegia - caused by: Other cerebrovascular disease                Time: 1033-1055 OT Time Calculation (min): 22 min Charges:  OT General Charges $OT Visit: 1 Visit OT Evaluation $OT Eval Moderate Complexity: 1 Mod  10/03/2023  RP, OTR/L  Acute Rehabilitation Services  Office:  [phone]   Elijah [name] 10/03/2023, 11:16 AM

## 2023-10-03 NOTE — Progress Notes (Signed)
 NAME:  Elijah [name], MRN:  [id number], DOB:  03-17-1956, LOS: 2 ADMISSION DATE:  10/01/2023, CONSULTATION DATE:  10/02/23 REFERRING MD:  neurosx, CHIEF COMPLAINT:  post operative from intracranial mass   History of Present Illness:  68 yo with h/o lymphoma following with [facility] and [facility] oncology/heme presented with L sided weakness. Pt had recently completed 6 cycles of chemo on 6/9 and repeat imaging was revealing remission. Family stated that during his chemo pt would have intermittent jerking that would self resolve. He also had complaints of generalized fatigue and weakness that was thought to be secondary to medications and treatment. However, over the past 2-3 days he began having progressive L sided weakness with 4 falls as well. One fall resulted in large flank hematoma.   Pt presented to ED where [facility] revealed R frontal/L parietal lesions and CT abd pelvis revealed large perinephric hematoma with mild L hydronephrosis.   Pt additionally endorses n/v over the course of those 2-3 days. Denied diarrhea, fever/chills, pain other than L flank from bruise.   He was admitted to [facility] and neurosx consulted as well as urology. He was increased on decadron  and ultimately underwent stereotactic brain bx. Urology recommended repeat imaging in 4 months.   CCM was consulted per routine co-management protocol with neurosx. At this time pt is sleeping comfortably after being agitated post operatively. Wife at bedside says he was restless and pulling at lines and clothing and is now resting comfortably.  Pertinent  Medical History  Lymphoma Asthma Gerd Bph Hyperlipidemia Depression Osa Polymyalgia rheumatica  Significant Hospital Events: Including procedures, antibiotic start and stop dates in addition to other pertinent events   Admitted to [facility] 7/2 Brain bx 7/3  Interim History / Subjective:  NAEON, left hand weak can not squeeze can raise LUE although weak, RLE slightly weak compared to right, R  strength intact through out   Objective    Blood pressure (!) 146/82, pulse 87, temperature 98.2 F (36.8 C), temperature source Oral, resp. rate 17, height 6' 1 (1.854 m), weight 106.6 kg, SpO2 94%.        Intake/Output Summary (Last 24 hours) at 10/03/2023 1016 Last data filed at 10/03/2023 0400 Gross per 24 hour  Intake 14.83 ml  Output 710 ml  Net -695.17 ml   Filed Weights   10/01/23 0952  Weight: 106.6 kg    Examination: General: resting comfortably in nad HENT: [place], [place]la, mmmp Lungs: ctab Cardiovascular: rrr Abdomen: soft nd,nt Extremities: no c/c/e  Neuro: arousable, left hand cannot squeeze, can lift left upper extremity but weak, 3+, left lower extremity 4+, right side 5 and intact. GU: deferred  Resolved problem list   Assessment and Plan  Post operative medical management Agitation L parietal mass R frontal mass Lymphoma Perinephric hematoma N/v -supportive care - PPI twice daily -- Dexamethasone  per neurosurgery - Keppra  for postoperative seizure prophylaxis, continue or discontinue at discretion of neurosurgery - Continue hypertension medications, will need outpatient follow-up with urology for perinephric hematoma - PT/OT to assist in evaluation and devices needed for safe discharge home  Seems stable for transfer out of from ICU or be discharged at discretion of neurosurgery.  PCCM is available as needed, if remains in the ICU will continue to follow.  If not, we will sign off.  Best Practice (right click and Reselect all SmartList Selections daily)   Per Primary  Labs   CBC: Recent Labs  Lab 10/01/23 0959 10/02/23 0401 10/03/23 0452  WBC 19.0*  18.3* 14.7*  NEUTROABS 15.0*  --   --   HGB 10.3* 9.9* 9.1*  HCT 32.0* 31.5* 28.6*  MCV 97.9 98.1 96.6  PLT 292 284 263    Basic Metabolic Panel: Recent Labs  Lab 10/01/23 0959 10/02/23 0401 10/03/23 0452  NA 141 137 139  K 3.5 4.1 4.2  CL 108 104 106  CO2 24 21* 24  GLUCOSE 141*  179* 210*  BUN 16 12 16   CREATININE 1.07 0.95 1.09  CALCIUM 9.1 9.3 9.3  MG 1.8  --  2.0   GFR: Estimated Creatinine Clearance: 85.4 mL/min (by C-G formula based on SCr of 1.09 mg/dL). Recent Labs  Lab 10/01/23 0959 10/02/23 0401 10/03/23 0452  WBC 19.0* 18.3* 14.7*    Liver Function Tests: Recent Labs  Lab 10/01/23 0959 10/02/23 0401 10/03/23 0452  AST 16 14* 13*  ALT 25 21 19   ALKPHOS 61 60 50  BILITOT 0.6 0.6 0.5  PROT 6.4* 6.5 6.2*  ALBUMIN  3.4* 3.3* 3.0*   No results for input(s): LIPASE, AMYLASE in the last 168 hours. No results for input(s): AMMONIA in the last 168 hours.  ABG    Component Value Date/Time   HCO3 24.6 (H) 06/17/2007 2151   TCO2 26 06/17/2007 2151     Coagulation Profile: No results for input(s): INR, PROTIME in the last 168 hours.  Cardiac Enzymes: Recent Labs  Lab 10/01/23 0959  CKTOTAL 101    HbA1C: Hgb A1c MFr Bld  Date/Time Value Ref Range Status  04/10/2023 08:53 PM 7.5 (H) 4.8 - 5.6 % Final    Comment:    (NOTE) Pre diabetes:          5.7%-6.4%  Diabetes:              >6.4%  Glycemic control for   <7.0% adults with diabetes     CBG: No results for input(s): GLUCAP in the last 168 hours.  Review of Systems:   N/a  Past Medical History:  He,  has a past medical history of Acid reflux, Allergic rhinitis, Asthma, Bloating, BPH (benign prostatic hyperplasia), Depression, ED (erectile dysfunction), Epigastric pain, Exercise-induced asthma, GERD (gastroesophageal reflux disease), History of kidney stones, Hypercholesteremia, Migraine (2009), Polymyalgia rheumatica (HCC), Prediabetes, Prostate disease, and Sleep apnea.   Surgical History:   Past Surgical History:  Procedure Laterality Date   ANKLE SURGERY Right    APPLICATION OF CRANIAL NAVIGATION Left 10/02/2023   Procedure: COMPUTER-ASSISTED NAVIGATION, FOR CRANIAL PROCEDURE;  Surgeon: Colon Shove, MD;  Location: [facility] OR;  Service: Neurosurgery;   Laterality: Left;  BRAIN BIOPSY LEFT PERIATAL BRAIN WITH STEALTH GUIDANCE   BOWEL RESECTION N/A 04/10/2023   Procedure: SMALL BOWEL RESECTION WITH MASS;  Surgeon: Polly Cordella LABOR, MD;  Location: [facility] OR;  Service: General;  Laterality: N/A;   CHEST TUBE INSERTION Left    COLONOSCOPY     CYSTOSCOPY WITH RETROGRADE PYELOGRAM, URETEROSCOPY AND STENT PLACEMENT Left 06/29/2020   Procedure: CYSTOSCOPY WITH RETROGRADE PYELOGRAM, URETEROSCOPY , BASKET EXTRACTION, AND STENT PLACEMENT;  Surgeon: Alvaro Hummer, MD;  Location: [facility] ORS;  Service: Urology;  Laterality: Left;  1 HR   IR CV LINE INJECTION  07/25/2023   IR IMAGING GUIDED PORT INSERTION  05/13/2023   KNEE SURGERY Right    LAPAROTOMY N/A 04/10/2023   Procedure: EXPLORATORY LAPAROTOMY;  Surgeon: Polly Cordella LABOR, MD;  Location[facility];  Service: General;  Laterality: N/A;   PR DURAL GRAFT SPINAL Left 10/02/2023   Procedure: FRAMELESS STEREOTACTIC BIOPSY;  Surgeon: Colon Shove, MD;  Location: [facility] OR;  Service: Neurosurgery;  Laterality: Left;   SKIN CANCER EXCISION     unsure   TONSILLECTOMY     VASECTOMY     WISDOM TOOTH EXTRACTION       Social History:   reports that he has never smoked. He has never been exposed to tobacco smoke. He has never used smokeless tobacco. He reports current alcohol use. He reports that he does not use drugs.   Family History:  His family history includes Healthy in his daughter and son.   Allergies Allergies  Allergen Reactions   Singulair [Montelukast] Hives   Amlodipine  Other (See Comments)    agitation   Crestor [Rosuvastatin] Other (See Comments)    Muscle Aches      Home Medications  Prior to Admission medications   Medication Sig Start Date End Date Taking? Authorizing Provider  albuterol  (VENTOLIN  HFA) 108 (90 Base) MCG/ACT inhaler Inhale 2 puffs into the lungs every 4 (four) hours as needed. 09/15/23  Yes   allopurinol  (ZYLOPRIM ) 300 MG tablet Take 1 tablet (300 mg total) by mouth daily.  05/19/23  Yes Federico Norleen ONEIDA MADISON, MD  DULoxetine  (CYMBALTA ) 30 MG capsule Take 1 capsule (30 mg total) by mouth daily. 09/15/23  Yes Federico Norleen ONEIDA MADISON, MD  finasteride  (PROSCAR ) 5 MG tablet Take 1 tablet (5 mg total) by mouth daily. 02/11/23  Yes   lidocaine -prilocaine  (EMLA ) cream Apply 1 Application topically as needed. Patient taking differently: Apply 1 Application topically as needed (for port). 05/19/23  Yes Federico Norleen ONEIDA MADISON, MD  loratadine  (CLARITIN ) 10 MG tablet Take 10 mg by mouth daily.   Yes [provider]  methocarbamol  (ROBAXIN ) 500 MG tablet Take 2 tablets (1,000 mg total) by mouth every 8 (eight) hours as needed for muscle spasms. 06/16/23  Yes Federico Norleen ONEIDA MADISON, MD  ondansetron  (ZOFRAN ) 8 MG tablet Take 1 tablet (8 mg total) by mouth every 8 (eight) hours as needed. 06/17/23  Yes Federico Norleen ONEIDA MADISON, MD  oxyCODONE  (OXY IR/ROXICODONE ) 5 MG immediate release tablet Take 1-2 tablets (5-10 mg total) by mouth every 6 (six) hours as needed for severe pain (pain score 7-10). 04/23/23  Yes Federico Norleen ONEIDA MADISON, MD  pantoprazole  (PROTONIX ) 40 MG tablet Take 1 tablet (40 mg total) by mouth daily 30 minutes to 1 hour before morning meal. 05/01/23  Yes   predniSONE  (DELTASONE ) 5 MG tablet Take 5 mg by mouth daily with breakfast.   Yes [provider]  prochlorperazine  (COMPAZINE ) 10 MG tablet Take 1 tablet (10 mg total) by mouth every 6 (six) hours as needed for nausea or vomiting. 07/20/23  Yes Federico Norleen ONEIDA MADISON, MD  Accu-Chek Softclix Lancets lancets Use daily before breakfast. 04/16/23   Briana Elgin LABOR, MD  alfuzosin  (UROXATRAL ) 10 MG 24 hr tablet Take 1 tablet (10 mg total) by mouth daily. 07/25/23     amoxicillin -clavulanate (AUGMENTIN ) 875-125 MG tablet Take 1 tablet (875 mg total) by mouth every 12 (twelve) hours for 120 days 06/13/23     Blood Glucose Monitoring Suppl (ACCU-CHEK GUIDE) w/Device KIT Use daily before breakfast. 04/16/23   Briana Elgin LABOR, MD  CINNAMON PO Take 1 capsule  by mouth daily.    [provider]  glucose blood (ACCU-CHEK GUIDE TEST) test strip Use daily before breakfast. 04/16/23   Briana Elgin LABOR, MD  Glucose Blood (BLOOD GLUCOSE TEST STRIPS) STRP Use to check blood sugar before breakfast as  directed 04/16/23   Briana Elgin LABOR, MD  predniSONE  (DELTASONE ) 1 MG tablet Take 3 tablets by mouth daily, along with a 5mg  tablet for a total of 8mg  daily for 1 month. Taper by 1mg  monthly. Patient not taking: Reported on 10/01/2023 06/16/23   Dolphus Reiter, MD  predniSONE  (DELTASONE ) 20 MG tablet Take 3 tablets (60 mg total) by mouth daily with breakfast. Take for 5 days starting day one of chemotherapy Patient not taking: Reported on 10/01/2023 06/16/23   Federico Norleen ONEIDA MADISON, MD  Probiotic TBEC Take 1 capsule by mouth daily.    [provider]     Critical care time: n/a    Donnice JONELLE Beals, MD See Tracey

## 2023-10-03 NOTE — Progress Notes (Signed)
 Physical Therapy Treatment Patient Details Name: Elijah [name] MRN: [id number] DOB: 10-09-1955 Today's Date: 10/03/2023   History of Present Illness Patient is 68 y.o. male presented to the ED with worsening left-sided weakness over the last week. Medical history significant of monomorphic epithelialotropic intestinal T-cell lymphoma-who is being co-managed by [facility] health oncology and [facility]  hematology clinic.  finished 6 cycles of chemotherapy at the beginning of June, had a follow-up PET/CT at [facility] that showed remission. In ED neuroimaging showed ill-defined lesions of the right frontal left parietal lobe, concerning for CNS lymphoma. Additional PMH significant for polymyalgia rheumatica, BPH, GERD, DM, asthma.    PT Comments  Pt eager to d/c home, PT wanting to see pt for second session to practice gait with L platform RW and stair navigation again. Pt requiring light steadying and RW management assist, pt listing L and requiring mod cuing to attend to L given L inattention. Pt also requiring light assist if providing HHA only during gait. Pt practiced stairs again, which were difficult again on second attempt especially given pt fear of falling and LLE weakness. Pt's wife instructed how to guard pt, and gait belt given for stair navigation in particular. PT advised pt to avoid going down stairs until [facility] starts, as they can practice descending stairs as intervention. Pt and family express eagerness to d/c home. RN notified of equipment needs, can d/c from PT standpoint with support of his wife.     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    Transfers Overall transfer level: Needs assistance Equipment used: Left platform walker, None Transfers: Sit to/from Stand, Bed to chair/wheelchair/BSC Sit to Stand: Min assist           General transfer comment: light rise assist, stand x2 from EOB and recliner.    Ambulation/Gait Ambulation/Gait assistance: Min assist Gait Distance (Feet): 100 Feet Assistive device: Left platform walker, None Gait Pattern/deviations: Step-through pattern, Decreased stride length, Decreased step length - left, Shuffle Gait velocity: decr     General Gait Details: assist to steady, maneuver RW as pt veering L with L inattention noted. cues for scanning environment given inattention. ambulated x15 ft without RW and HHA only, light assist   Stairs Stairs: Yes Stairs assistance: Min assist, +2 safety/equipment, Mod assist Stair Management: One rail Right, Step to pattern, Forwards Number of Stairs: 3 General stair comments: min assist for ascending with cues for sequencing (up with the good, down with the bad leg leading). Descending stairs pt requiring mod assist to steady and guide feet, pt too nervous to descend with LLE leading and ended up leading with RLE even though this is his stronger leg. pt bumped his L knee on railing on descending, mild bruise noted on patella but no bleeding, pt/family and RN notified.   Wheelchair Mobility  Tilt Bed    Modified Rankin (Stroke Patients Only)       Balance Overall balance assessment: Needs assistance Sitting-balance support: No upper extremity supported, Feet supported Sitting balance-Leahy Scale: Fair      Standing balance support: No upper extremity supported, During functional activity Standing balance-Leahy Scale: Fair Standing balance comment: fair to poor, increasing L lateral bias with fatigue                            Communication Communication Communication: No apparent difficulties  Cognition Arousal: Alert Behavior During Therapy: WFL for tasks assessed/performed   PT - Cognitive impairments: No apparent impairments                         Following commands: Intact      Cueing Cueing Techniques: Verbal cues, Gestural cues  Exercises      General Comments General comments (skin integrity, edema, etc.): L inattention      Pertinent Vitals/Pain Pain Assessment Pain Assessment: No/denies pain    Home Living Family/patient expects to be discharged to:: Private residence Living Arrangements: Spouse/significant other Available Help at Discharge: Available 24 hours/day;Family Type of Home: House Home Access: Stairs to enter Entrance Stairs-Rails: None Entrance Stairs-Number of Steps: 3 Alternate Level Stairs-Number of Steps: 10 Home Layout: Two level Home Equipment: None Additional Comments: bedroom upstairs but may sleep in recliner downstairs.    Prior Function            PT Goals (current goals can now be found in the care plan section) Acute Rehab PT Goals PT Goal Formulation: With patient Time For Goal Achievement: 10/17/23 Potential to Achieve Goals: Good Progress towards PT goals: Progressing toward goals    Frequency    Min 2X/week      PT Plan      Co-evaluation              AM-PAC PT 6 Clicks Mobility   Outcome Measure  Help needed turning from your back to your side while in a flat bed without using bedrails?: A Little Help needed moving from lying on your back to sitting on the side of a flat bed without using bedrails?: A Little Help needed moving to and from a bed to a chair (including a  wheelchair)?: A Little Help needed standing up from a chair using your arms (e.g., wheelchair or bedside chair)?: A Little Help needed to walk in hospital room?: A Little Help needed climbing 3-5 steps with a railing? : A Lot 6 Click Score: 17    End of Session Equipment Utilized During Treatment: Gait belt Activity Tolerance: Patient tolerated treatment well;Patient limited by fatigue Patient left: in chair;with call bell/phone within reach;with chair alarm set;with family/visitor present;Other (comment) (OT present) Nurse Communication: Mobility status PT Visit Diagnosis: Other abnormalities of gait and mobility (R26.89);Muscle weakness (generalized) (M62.81)     Time: 8849-8779 PT Time Calculation (min) (ACUTE ONLY): 30 min  Charges:    $Gait Training: 23-37 mins PT General Charges $$ ACUTE PT VISIT: 1 Visit                     Johana RAMAN, PT DPT Acute Rehabilitation Services Secure Chat Preferred  Office [phone]    Lasya Vetter E Stroup 10/03/2023, 1:02 PM

## 2023-10-03 NOTE — Progress Notes (Signed)
 PROGRESS NOTE  Elijah [name] FMW:[id number] DOB: September 20, 1955 DOA: 10/01/2023 PCP: Seabron Lenis, MD   LOS: 2 days   Brief Narrative / Interim history: 68 y.o. male with medical history significant of monomorphic epithelialotropic intestinal T-cell lymphoma-who is being co-managed by [facility] health oncology and [facility]  hematology clinic-presented to the ED with worsening left-sided weakness.  She has finished 6 cycles of chemotherapy at the beginning of June, had a follow-up PET/CT at [facility] that showed remission.  Over the last week, more so in the last few days has been having left-sided weakness.  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  Subjective / 24h Interval events: Underwent brain biopsy yesterday. Mild agitation overnight but better this morning. 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  Oncology, neurology and neurosurgery all consulted.  A brain biopsy was entertained however per neurosurgery there would prefer to sample the CSF fluid.  Neurology consulted today, appreciate input, discussed with Dr. Michaela - Continue Decadron  - EEG of the brain was without seizures epileptiform discharges - Looks like he will be discharged today per neurosurgery  Active problems Monomorphic epithelialotropic intestinal T cell lymphoma -comanaged by [facility] health oncology and [facility].  Appreciate hematology follow-up, will need to follow-up of his biopsy as an outpatient  Perinephric hematoma-possibly related to trauma, urology evaluated patient, for now continue supportive management.  Hemoglobin and renal function stable  History of PMR-on chronic prednisone , now on Decadron   Leukocytosis-possibly related to steroids, improving  Normocytic anemia-possibly related to underlying malignancy, hemoglobin stable  Vomiting-CT  abdomen pelvis without any traction/ileus.  This is resolved now, tolerating a regular diet  Essential hypertension-has had numerous side effects from multiple antihypertensives.  Monitor for now  Scheduled Meds:  alfuzosin   10 mg Oral Daily   Chlorhexidine  Gluconate Cloth  6 each Topical Q0600   dexamethasone   10 mg Intravenous Q8H   docusate sodium   100 mg Oral BID   finasteride   5 mg Oral Daily   levETIRAcetam   500 mg Intravenous Q12H   loratadine   10 mg Oral Daily   pantoprazole  (PROTONIX ) IV  40 mg Intravenous Q12H   polyethylene glycol  17 g Oral Daily   senna  1 tablet Oral BID   Continuous Infusions: PRN Meds:.acetaminophen  **OR** acetaminophen , albuterol , bisacodyl , hydrOXYzine , labetalol , labetalol , morphine  injection, ondansetron  **OR** ondansetron  (ZOFRAN ) IV, mouth rinse, oxyCODONE , polyethylene glycol, prochlorperazine , sodium phosphate   Current Outpatient Medications  Medication Instructions   Accu-Chek Softclix Lancets lancets Use daily before breakfast.   albuterol  (VENTOLIN  HFA) 108 (90 Base) MCG/ACT inhaler 2 puffs, Inhalation, Every 4 hours PRN   alfuzosin  (UROXATRAL ) 10 mg, Oral, Daily   allopurinol  (ZYLOPRIM ) 300 mg, Oral, Daily   ALPRAZolam  (XANAX ) 0.5 mg, Oral, 2 times daily PRN   amoxicillin -clavulanate (AUGMENTIN ) 875-125 MG tablet Take 1 tablet (875 mg total) by mouth every 12 (twelve) hours for 120 days   Blood Glucose Monitoring Suppl (ACCU-CHEK GUIDE) w/Device KIT Use daily before breakfast.   CINNAMON PO 1 capsule, Daily   dexamethasone  (DECADRON ) 2 mg, Oral, 2 times daily   DULoxetine  (CYMBALTA ) 30 mg, Oral, Daily   finasteride  (PROSCAR ) 5 mg, Oral, Daily   glucose blood (ACCU-CHEK GUIDE TEST) test strip Use daily before breakfast.   Glucose Blood (BLOOD GLUCOSE TEST STRIPS) STRP Use to check blood sugar before breakfast as directed   lidocaine -prilocaine  (EMLA )  cream 1 Application, Topical, As needed   loratadine  (CLARITIN ) 10 mg, Daily    methocarbamol  (ROBAXIN ) 1,000 mg, Oral, Every 8 hours PRN   ondansetron  (ZOFRAN ) 8 mg, Oral, Every 8 hours PRN   oxyCODONE  (OXY IR/ROXICODONE ) 5-10 mg, Oral, Every 6 hours PRN   pantoprazole  (PROTONIX ) 40 MG tablet Take 1 tablet (40 mg total) by mouth daily 30 minutes to 1 hour before morning meal.   predniSONE  (DELTASONE ) 1 MG tablet Take 3 tablets by mouth daily, along with a 5mg  tablet for a total of 8mg  daily for 1 month. Taper by 1mg  monthly.   predniSONE  (DELTASONE ) 60 mg, Oral, Daily with breakfast, Take for 5 days starting day one of chemotherapy   predniSONE  (DELTASONE ) 5 mg, Oral, Daily with breakfast   Probiotic TBEC 1 capsule, Daily   prochlorperazine  (COMPAZINE ) 10 mg, Oral, Every 6 hours PRN    Diet Orders (From admission, onward)     Start     Ordered   10/03/23 0801  Diet regular Room service appropriate? Yes; Fluid consistency: Thin  Diet effective now       Question Answer Comment  Room service appropriate? Yes   Fluid consistency: Thin      10/03/23 0801   10/03/23 0000  Diet - low sodium heart healthy        10/03/23 1044            DVT prophylaxis: SCDs Start: 10/02/23 1840 SCDs Start: 10/01/23 1724   Lab Results  Component Value Date   PLT 263 10/03/2023      Code Status: Full Code  Family Communication: wife at bedside   Status is: Inpatient  Level of care: ICU  Consultants:  Oncology Neurology Neurosurgery Neurology Trauma surgery  Objective: Vitals:   10/03/23 0700 10/03/23 0800 10/03/23 0900 10/03/23 1000  BP: (!) 144/88 (!) 146/82 (!) 157/85 (!) 142/84  Pulse: 75 87 87 92  Resp: (!) 9 17 (!) 22 19  Temp:  98.2 F (36.8 C)    TempSrc:  Oral    SpO2: 95% 94% 93% 94%  Weight:      Height:        Intake/Output Summary (Last 24 hours) at 10/03/2023 1102 Last data filed at 10/03/2023 0400 Gross per 24 hour  Intake 14.83 ml  Output 710 ml  Net -695.17 ml   Wt Readings from Last 3 Encounters:  10/01/23 106.6 kg  09/08/23 109  kg  08/19/23 107.4 kg    Examination:  Constitutional: NAD Eyes: lids and conjunctivae normal, no scleral icterus ENMT: mmm Neck: normal, supple Respiratory: clear to auscultation bilaterally, no wheezing, no crackles. Normal respiratory effort.  Cardiovascular: Regular rate and rhythm, no murmurs / rubs / gallops. No LE edema. Abdomen: soft, no distention, no tenderness. Bowel sounds positive.   Data Reviewed: I have independently reviewed following labs and imaging studies   CBC Recent Labs  Lab 10/01/23 0959 10/02/23 0401 10/03/23 0452  WBC 19.0* 18.3* 14.7*  HGB 10.3* 9.9* 9.1*  HCT 32.0* 31.5* 28.6*  PLT 292 284 263  MCV 97.9 98.1 96.6  MCH 31.5 30.8 30.7  MCHC 32.2 31.4 31.8  RDW 16.5* 16.0* 16.0*  LYMPHSABS 0.8  --   --   MONOABS 2.8*  --   --   EOSABS 0.2  --   --   BASOSABS 0.1  --   --     Recent Labs  Lab 10/01/23 0959 10/02/23 0401 10/03/23 0452  NA 141 137 139  K 3.5 4.1 4.2  CL 108 104 106  CO2 24 21* 24  GLUCOSE 141* 179* 210*  BUN 16 12 16   CREATININE 1.07 0.95 1.09  CALCIUM 9.1 9.3 9.3  AST 16 14* 13*  ALT 25 21 19   ALKPHOS 61 60 50  BILITOT 0.6 0.6 0.5  ALBUMIN  3.4* 3.3* 3.0*  MG 1.8  --  2.0    ------------------------------------------------------------------------------------------------------------------ No results for input(s): CHOL, HDL, LDLCALC, TRIG, CHOLHDL, LDLDIRECT in the last 72 hours.  Lab Results  Component Value Date   HGBA1C 7.5 (H) 04/10/2023   ------------------------------------------------------------------------------------------------------------------ No results for input(s): TSH, T4TOTAL, T3FREE, THYROIDAB in the last 72 hours.  Invalid input(s): FREET3  Cardiac Enzymes No results for input(s): CKMB, TROPONINI, MYOGLOBIN in the last 168 hours.  Invalid input(s):  CK ------------------------------------------------------------------------------------------------------------------ No results found for: BNP  CBG: No results for input(s): GLUCAP in the last 168 hours.  No results found for this or any previous visit (from the past 240 hours).   Radiology Studies: MR BRAIN W WO CONTRAST Result Date: 10/02/2023 CLINICAL DATA:  Brain/CNS neoplasm, staging for sterotactic brain biopsy 1mm cuts and include whole face EXAM: MRI HEAD WITHOUT AND WITH CONTRAST TECHNIQUE: Multiplanar, multiecho pulse sequences of the brain and surrounding structures were obtained without and with intravenous contrast. CONTRAST:  10mL GADAVIST  GADOBUTROL  1 MMOL/ML IV SOLN COMPARISON:  MRI of the head dated October 01, 2023. FINDINGS: Brain: Ill-defined lesions associated with restricted diffusion are again demonstrated within the right posterior frontal lobe and the left parietal lobe. There is irregular wispy enhancement of the lesion within the right frontal lobe. There is also enhancement within the adjacent cerebral sulci. There is enhancement within the sulci of the left parietal lesion as well. 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 There are scattered foci of increased T2 signal also seen throughout the cerebral hemispheres. Vascular: Normal flow voids.  The venous sinuses are patent. Skull and upper cervical spine: Normal marrow signal. No osseous lesions. Sinuses/Orbits: The paranasal sinuses and orbits are unremarkable. Other: None. IMPRESSION: 1. Ill-defined lesions are again demonstrated within the right posterior frontal lobe and the left parietal lobe, which again demonstrated restricted diffusion. 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 Lumbar puncture for CSF analysis might be considered. An MRI of the total spine without and with gadolinium contrast is also suggested to assess for  drop metastatic disease. Electronically Signed   By: Evalene Coho M.D.   On: 10/02/2023 14:21     Nilda Fendt, MD, PhD [facility]  Between 7 am - 7 pm I am available, please contact me via Amion (for emergencies) or Securechat (non urgent messages)  Between 7 pm - 7 am I am not available, please contact night coverage MD/APP via Amion

## 2023-10-03 NOTE — Progress Notes (Signed)
 Patient ID: Elijah [name], male   DOB: 10/03/1955, 68 y.o.   MRN: [id number] Vital signs are stable.  Tolerated biopsy well.  Left lower extremity strong enough to allow him to walk with some assistance.  Will send home today with home health PT and OT.  Wife describes some agitation particularly at nights.  Will provide some medication a form of Xanax .

## 2023-10-03 NOTE — Discharge Summary (Signed)
 Physician Discharge Summary  Patient ID: Elijah [name] MRN: [id number] DOB/AGE: 05-27-1955 68 y.o.  Admit date: 10/01/2023 Discharge date: 10/03/2023  Admission Diagnoses: Left-sided weakness  Discharge Diagnoses: Sided weakness metastatic brain tumor Principal Problem:   Left-sided weakness   Discharged Condition: fair  Hospital Course: Patient was admitted with newly diagnosed lesion.  He has been being treated for a T-cell lymphoma.  He was recently felt to be in remission however he developed left-sided weakness over about a 3 to 4-day period MRI demonstrates presence of new lesions in the left and right side of his brain.  He underwent stereotactic biopsy  Consults: neurology and hospitalist  Significant Diagnostic Studies: Brain biopsy  Treatments: surgery: See op note  Discharge Exam: Blood pressure (!) 142/84, pulse 92, temperature 98.2 F (36.8 C), temperature source Oral, resp. rate 19, height 6' 1 (1.854 m), weight 106.6 kg, SpO2 94%. Patient has weakness of left upper extremity.  He has no useful function but has good sensation.  His left lower extremity he has function and sensation but it is significantly weak.  He is ambulatory with the use of a walker.  Disposition: Discharge disposition: 01-Home or Self Care       Discharge Instructions     Call MD for:  redness, tenderness, or signs of infection (pain, swelling, redness, odor or green/yellow discharge around incision site)   Complete by: As directed    Call MD for:  severe uncontrolled pain   Complete by: As directed    Call MD for:  temperature >100.4   Complete by: As directed    Diet - low sodium heart healthy   Complete by: As directed    Increase activity slowly   Complete by: As directed    No dressing needed   Complete by: As directed       Allergies as of 10/03/2023       Reactions   Singulair [montelukast] Hives   Amlodipine  Other (See Comments)   agitation   Crestor [rosuvastatin]  Other (See Comments)   Muscle Aches         Medication List     STOP taking these medications    predniSONE  1 MG tablet Commonly known as: DELTASONE    predniSONE  20 MG tablet Commonly known as: DELTASONE    predniSONE  5 MG tablet Commonly known as: DELTASONE        TAKE these medications    Accu-Chek Guide Test test strip Generic drug: glucose blood Use to check blood sugar before breakfast as directed   Accu-Chek Guide Test test strip Generic drug: glucose blood Use daily before breakfast.   Accu-Chek Guide w/Device Kit Use daily before breakfast.   Accu-Chek Softclix Lancets lancets Use daily before breakfast.   albuterol  108 (90 Base) MCG/ACT inhaler Commonly known as: VENTOLIN  HFA Inhale 2 puffs into the lungs every 4 (four) hours as needed.   alfuzosin  10 MG 24 hr tablet Commonly known as: UROXATRAL  Take 1 tablet (10 mg total) by mouth daily.   allopurinol  300 MG tablet Commonly known as: ZYLOPRIM  Take 1 tablet (300 mg total) by mouth daily.   ALPRAZolam  0.5 MG tablet Commonly known as: Xanax  Take 1 tablet (0.5 mg total) by mouth 2 (two) times daily as needed for sleep or anxiety.   amoxicillin -clavulanate 875-125 MG tablet Commonly known as: AUGMENTIN  Take 1 tablet (875 mg total) by mouth every 12 (twelve) hours for 120 days   CINNAMON PO Take 1 capsule by mouth daily.   dexamethasone   2 MG tablet Commonly known as: DECADRON  Take 1 tablet (2 mg total) by mouth 2 (two) times daily.   DULoxetine  30 MG capsule Commonly known as: CYMBALTA  Take 1 capsule (30 mg total) by mouth daily.   finasteride  5 MG tablet Commonly known as: PROSCAR  Take 1 tablet (5 mg total) by mouth daily.   lidocaine -prilocaine  cream Commonly known as: EMLA  Apply 1 Application topically as needed. What changed: reasons to take this   loratadine  10 MG tablet Commonly known as: CLARITIN  Take 10 mg by mouth daily.   methocarbamol  500 MG tablet Commonly known as:  ROBAXIN  Take 2 tablets (1,000 mg total) by mouth every 8 (eight) hours as needed for muscle spasms.   ondansetron  8 MG tablet Commonly known as: ZOFRAN  Take 1 tablet (8 mg total) by mouth every 8 (eight) hours as needed.   oxyCODONE  5 MG immediate release tablet Commonly known as: Oxy IR/ROXICODONE  Take 1-2 tablets (5-10 mg total) by mouth every 6 (six) hours as needed for severe pain (pain score 7-10).   pantoprazole  40 MG tablet Commonly known as: PROTONIX  Take 1 tablet (40 mg total) by mouth daily 30 minutes to 1 hour before morning meal.   Probiotic Tbec Take 1 capsule by mouth daily.   prochlorperazine  10 MG tablet Commonly known as: COMPAZINE  Take 1 tablet (10 mg total) by mouth every 6 (six) hours as needed for nausea or vomiting.               Durable Medical Equipment  (From admission, onward)           Start     Ordered   10/03/23 1042  DME Walker  Once       Question Answer Comment  Walker: With 5 Inch Wheels   Patient needs a walker to treat with the following condition Brain tumor [facility])      10/03/23 1044              Discharge Care Instructions  (From admission, onward)           Start     Ordered   10/03/23 0000  No dressing needed        10/03/23 1044             Signed: Victory JINNY Gens 10/03/2023, 10:44 AM

## 2023-10-03 NOTE — Progress Notes (Signed)
 eLink Physician-Brief Progress Note Patient Name: KAVON VALENZA DOB: 1955/12/13 MRN: [id number]   Date of Service  10/03/2023  HPI/Events of Note  68 y.o. male with medical history significant of monomorphic epithelialotropic intestinal T-cell lymphoma-who is being co-managed by [facility] health oncology and [facility]  hematology clinic-presented to the ED with worsening left-sided weakness.  Presents to the ICU post stereotactic biopsy of his intracranial lesions.  Patient is mildly hypertensive but otherwise normal vitals on room air.  Now off of phenylephrine  infusion, propofol , and he received prophylactic Keppra .  Results show essentially normal BMP, leukocytosis and normocytic anemia.  Imaging reviewed with evidence of intracranial masses and left large perinephric hematoma and hydronephrosis  eICU Interventions  Frequent neurological checks, dexamethasone , neuroprotective measures scheduled Keppra   Requesting anxiolytic, add hydroxyzine   DVT prophylaxis with SCDs GI prophylaxis with pantoprazole      Intervention Category Evaluation Type: New Patient Evaluation  Pegah Segel 10/03/2023, 12:03 AM

## 2023-10-03 NOTE — Evaluation (Signed)
 Physical Therapy Evaluation Patient Details Name: Elijah [name] MRN: [id number] DOB: 04/04/1955 Today's Date: 10/03/2023  History of Present Illness  Patient is 68 y.o. male presented to the ED with worsening left-sided weakness over the last week. Medical history significant of monomorphic epithelialotropic intestinal T-cell lymphoma-who is being co-managed by [facility] health oncology and [facility]  hematology clinic.  finished 6 cycles of chemotherapy at the beginning of June, had a follow-up PET/CT at [facility] that showed remission. In ED neuroimaging showed ill-defined lesions of the right frontal left parietal lobe, concerning for CNS lymphoma. Additional PMH significant for polymyalgia rheumatica, BPH, GERD, DM, asthma.  Clinical Impression   Pt presents with L sided weakness UE>LE, impaired balance with L lateral bias, high fall risk, and decreased activity tolerance vs baseline. Pt to benefit from acute PT to address deficits. Pt ambulated hallway distance and navigated x3 steps, requiring up to mod assist for steadying and correcting L buckling especially with fatigue. Plan is to d/c today, PT to see for scond session to practice stair navigation with wife present. PT to progress mobility as tolerated, and will continue to follow acutely.          If plan is discharge home, recommend the following: A little help with walking and/or transfers;A little help with bathing/dressing/bathroom   Can travel by private vehicle        Equipment Recommendations Rolling walker (2 wheels) (with L platform)  Recommendations for Other Services       Functional Status Assessment Patient has had a recent decline in their functional status and demonstrates the ability to make significant improvements in function in a reasonable and predictable amount of time.     Precautions / Restrictions Precautions Precautions: Fall Precaution/Restrictions Comments: L hemiparesis  LUE>LLE Restrictions Weight Bearing Restrictions Per Provider Order: No      Mobility  Bed Mobility Overal bed mobility: Needs Assistance Bed Mobility: Supine to Sit     Supine to sit: Min assist     General bed mobility comments: light truncal and LLE assist.    Transfers Overall transfer level: Needs assistance Equipment used: None Transfers: Sit to/from Stand Sit to Stand: Contact guard assist           General transfer comment: slow to rise, no physical steadying assist    Ambulation/Gait Ambulation/Gait assistance: Min assist Gait Distance (Feet): 150 Feet Assistive device: None, Rolling walker (2 wheels), 1 person hand held assist Gait Pattern/deviations: Step-through pattern, Decreased stride length, Decreased step length - left, Shuffle Gait velocity: decr     General Gait Details: initially no AD or HHA, CGA. With fatigue, pt needing HHA vs RW though difficult to grip given L hand weakness. Heavy L lateral bias and more shuffling LLE with fatigue  Stairs Stairs: Yes Stairs assistance: Min assist, Mod assist, +2 safety/equipment Stair Management: One rail Right, Step to pattern, Forwards Number of Stairs: 3 General stair comments: min-mod assist for steadying, L knee guarding, x1 LOB after 3 steps requiring PT to balance. Max cues for fixing BOS, righting self, and slow sit on stairs to recover fatigue. cues for sequencing  Wheelchair Mobility     Tilt Bed    Modified Rankin (Stroke Patients Only)       Balance Overall balance assessment: Needs assistance Sitting-balance support: No upper extremity supported, Feet supported Sitting balance-Leahy Scale: Fair     Standing balance support: No upper extremity supported, During functional activity Standing balance-Leahy Scale: Fair Standing balance comment: fair  to poor, increasing L lateral bias with fatigue                             Pertinent Vitals/Pain Pain Assessment Pain  Assessment: No/denies pain    Home Living Family/patient expects to be discharged to:: Private residence Living Arrangements: Spouse/significant other Available Help at Discharge: Available 24 hours/day;Family Type of Home: House Home Access: Stairs to enter Entrance Stairs-Rails: None Entrance Stairs-Number of Steps: 3 Alternate Level Stairs-Number of Steps: 10 Home Layout: Two level Home Equipment: None Additional Comments: bedroom upstairs but may sleep in recliner downstairs.    Prior Function Prior Level of Function : Independent/Modified Independent             Mobility Comments: retired ADLs Comments: wife helping with LB dressing     Extremity/Trunk Assessment   Upper Extremity Assessment Upper Extremity Assessment: Right hand dominant;LUE deficits/detail LUE Deficits / Details: No AROM to hand, wrist and forearm.  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)    Cervical / Trunk Assessment Cervical / Trunk Assessment: Normal  Communication   Communication Communication: No apparent difficulties    Cognition Arousal: Alert Behavior During Therapy: WFL for tasks assessed/performed   PT - Cognitive impairments: No apparent impairments                                 Cueing Cueing Techniques: Verbal cues, Gestural cues     General Comments General comments (skin integrity, edema, etc.): L hand edema    Exercises     Assessment/Plan    PT Assessment Patient needs continued PT services  PT Problem List Decreased strength;Decreased mobility;Decreased range of  motion;Decreased balance;Decreased activity tolerance;Decreased knowledge of use of DME;Decreased safety awareness;Decreased knowledge of precautions;Impaired tone       PT Treatment Interventions DME instruction;Therapeutic activities;Gait training;Therapeutic exercise;Patient/family education;Balance training;Stair training;Functional mobility training;Neuromuscular re-education    PT Goals (Current goals can be found in the Care Plan section)  Acute Rehab PT Goals PT Goal Formulation: With patient Time For Goal Achievement: 10/17/23 Potential to Achieve Goals: Good    Frequency Min 2X/week     Co-evaluation               AM-PAC PT 6 Clicks Mobility  Outcome Measure Help needed turning from your back to your side while in a flat bed without using bedrails?: A Little Help needed moving from lying on your back to sitting on the side of a flat bed without using bedrails?: A Little Help needed moving to and from a bed to a chair (including a wheelchair)?: A Little Help needed standing up from a chair using your arms (e.g., wheelchair or bedside chair)?: A Little Help needed to walk in hospital room?: A Little Help needed climbing 3-5 steps with a railing? : A Lot 6 Click Score: 17    End of Session Equipment Utilized During Treatment: Gait belt Activity Tolerance: Patient tolerated treatment well;Patient limited by fatigue Patient left: in chair;with call bell/phone within reach;with chair alarm set;with family/visitor present;Other (comment) (OT present)  Nurse Communication: Mobility status PT Visit Diagnosis: Other abnormalities of gait and mobility (R26.89);Muscle weakness (generalized) (M62.81)    Time: 9048-8971 PT Time Calculation (min) (ACUTE ONLY): 37 min   Charges:   PT Evaluation $PT Eval Low Complexity: 1 Low PT Treatments $Gait Training: 8-22 mins PT General Charges $$ ACUTE PT VISIT: 1 Visit         Johana RAMAN, PT DPT Acute Rehabilitation  Services Secure Chat Preferred  Office [phone]   Kaleesi Guyton E Stroup 10/03/2023, 11:10 AM

## 2023-10-03 NOTE — TOC Transition Note (Signed)
 Transition of Care [facility]) - Discharge Note   Patient Details  Name: Elijah [name] MRN: [id number] Date of Birth: September 14, 1955  [facility] CM/SW Contact:  Corean JAYSON Canary, RN Phone Number: 10/03/2023, 12:30 PM   Clinical Narrative:     Spoke with patient and spouse via phone about discharge planning. They would like HH with [facility], they have had them before. Need a walker with left platform extension, Ordered this through [employer]. They would prefer not to wait and have it delivered to their house.  No further needs identified Patient will DC  Final next level of care: Home/Self Care Barriers to Discharge: No Barriers Identified   Patient Goals and CMS Choice            Discharge Placement                       Discharge Plan and Services Additional resources added to the After Visit Summary for                                       Social Drivers of Health (SDOH) Interventions SDOH Screenings   Food Insecurity: Unknown (10/02/2023)  Housing: Unknown (10/02/2023)  Transportation Needs: Unknown (10/02/2023)  Utilities: Not At Risk (10/02/2023)  Social Connections: Socially Integrated (06/04/2023)  Tobacco Use: Low Risk  (10/01/2023)     Readmission Risk Interventions    06/07/2023   11:21 AM  Readmission Risk Prevention Plan  Transportation Screening Complete  PCP or Specialist Appt within 3-5 Days Complete  HRI or Home Care Consult Complete  Social Work Consult for Recovery Care Planning/Counseling Complete  Palliative Care Screening Complete  Medication Review Oceanographer) Referral to Pharmacy

## 2023-10-04 ENCOUNTER — Other Ambulatory Visit (HOSPITAL_COMMUNITY): Payer: Self-pay

## 2023-10-04 ENCOUNTER — Telehealth: Payer: Self-pay

## 2023-10-04 NOTE — Telephone Encounter (Signed)
 Patients wife Darice called in regarding walker with platform They have not received it yet and have not heard anything from company. Have not heard from Home health agency either. Let them know normally it is 48 hours with home health. Called [employer] about walker delivery. Jermaine stated he will call wife for further instructions

## 2023-10-06 ENCOUNTER — Telehealth: Payer: Self-pay | Admitting: *Deleted

## 2023-10-06 ENCOUNTER — Telehealth: Payer: Self-pay

## 2023-10-06 ENCOUNTER — Other Ambulatory Visit (HOSPITAL_COMMUNITY): Payer: Self-pay

## 2023-10-06 ENCOUNTER — Other Ambulatory Visit: Payer: Self-pay | Admitting: *Deleted

## 2023-10-06 ENCOUNTER — Telehealth: Payer: Self-pay | Admitting: Hematology and Oncology

## 2023-10-06 ENCOUNTER — Encounter: Payer: Self-pay | Admitting: *Deleted

## 2023-10-06 MED ORDER — GABAPENTIN 300 MG PO CAPS
300.0000 mg | ORAL_CAPSULE | Freq: Every day | ORAL | 1 refills | Status: DC
  Filled 2023-10-06: qty 30, 30d supply, fill #0

## 2023-10-06 NOTE — Telephone Encounter (Signed)
 Received call from pt's wife, Darice.  She states that [name] came home from the hospital on 10/03/23. He has developed daily, ongoing headaches. In the hospital he was getting Dexamethasone  10 mg daily and was sent home on Dex 2mg  BID. She is asking if his dose can be increased to help with the headaches. She also states his thigh pain is worse-like he has electric shocks going through his thigh. He has tried many different things for this pain: Robaxin , Tylenol , Ibuprofen,oxycodone , heat, ice. Nothing is helping. Discussed the above with Dr. Federico. He is agreeable to increasing the decadron  to 4 mg BID and starting Gabapentin  300mg  @ HS. Also agreeable to having Dr. Buckley see pt this week as well as seeing Dr. Federico. Coordinating these visits for just 1 day as it takes a lot for pt to get out of the house due to his left sided weakness. Darice states they have OT/PT ordered and are waiting on the delivery of a walker for [name]. She is agreeable to these coordinated visits with Dr. Federico and Dr. Buckley. She will pick up new prescriptions today and use current Decadron  prescription for 4 mg BID. She is to call when she gets low.

## 2023-10-06 NOTE — Telephone Encounter (Signed)
 Scheduled appointments per 7/7 secure chat from the nurse. Talked with the patients wife and she is aware of the made appointments for the patient.

## 2023-10-06 NOTE — Telephone Encounter (Signed)
 Patients wife called to say she still has not received walker from July 4th when she called. [employer] is the provider, called Elijah [name] directly and has a conversation with his, he was actually in the chart, and asked if he could call her right away, he stated he would

## 2023-10-07 ENCOUNTER — Other Ambulatory Visit: Payer: Self-pay

## 2023-10-07 LAB — SURGICAL PATHOLOGY

## 2023-10-09 ENCOUNTER — Other Ambulatory Visit: Payer: Self-pay | Admitting: Hematology and Oncology

## 2023-10-09 ENCOUNTER — Inpatient Hospital Stay: Attending: Physician Assistant | Admitting: Internal Medicine

## 2023-10-09 ENCOUNTER — Inpatient Hospital Stay

## 2023-10-09 ENCOUNTER — Ambulatory Visit: Admitting: Hematology and Oncology

## 2023-10-09 VITALS — BP 154/104 | HR 112 | Temp 97.3°F | Resp 16

## 2023-10-09 DIAGNOSIS — R42 Dizziness and giddiness: Secondary | ICD-10-CM | POA: Diagnosis not present

## 2023-10-09 DIAGNOSIS — G629 Polyneuropathy, unspecified: Secondary | ICD-10-CM | POA: Insufficient documentation

## 2023-10-09 DIAGNOSIS — Z95828 Presence of other vascular implants and grafts: Secondary | ICD-10-CM

## 2023-10-09 DIAGNOSIS — C8449 Peripheral T-cell lymphoma, not classified, extranodal and solid organ sites: Secondary | ICD-10-CM

## 2023-10-09 DIAGNOSIS — C862 Enteropathy-type (intestinal) T-cell lymphoma not having achieved remission: Secondary | ICD-10-CM | POA: Diagnosis present

## 2023-10-09 DIAGNOSIS — Z79899 Other long term (current) drug therapy: Secondary | ICD-10-CM | POA: Diagnosis not present

## 2023-10-09 DIAGNOSIS — R109 Unspecified abdominal pain: Secondary | ICD-10-CM | POA: Diagnosis not present

## 2023-10-09 DIAGNOSIS — G96198 Other disorders of meninges, not elsewhere classified: Secondary | ICD-10-CM

## 2023-10-09 LAB — CBC WITH DIFFERENTIAL (CANCER CENTER ONLY)
Abs Immature Granulocytes: 0.19 K/uL — ABNORMAL HIGH (ref 0.00–0.07)
Basophils Absolute: 0 K/uL (ref 0.0–0.1)
Basophils Relative: 0 %
Eosinophils Absolute: 0 K/uL (ref 0.0–0.5)
Eosinophils Relative: 0 %
HCT: 35.5 % — ABNORMAL LOW (ref 39.0–52.0)
Hemoglobin: 11.6 g/dL — ABNORMAL LOW (ref 13.0–17.0)
Immature Granulocytes: 1 %
Lymphocytes Relative: 2 %
Lymphs Abs: 0.5 K/uL — ABNORMAL LOW (ref 0.7–4.0)
MCH: 30.9 pg (ref 26.0–34.0)
MCHC: 32.7 g/dL (ref 30.0–36.0)
MCV: 94.4 fL (ref 80.0–100.0)
Monocytes Absolute: 1.4 K/uL — ABNORMAL HIGH (ref 0.1–1.0)
Monocytes Relative: 5 %
Neutro Abs: 25 K/uL — ABNORMAL HIGH (ref 1.7–7.7)
Neutrophils Relative %: 92 %
Platelet Count: 334 K/uL (ref 150–400)
RBC: 3.76 MIL/uL — ABNORMAL LOW (ref 4.22–5.81)
RDW: 15.9 % — ABNORMAL HIGH (ref 11.5–15.5)
WBC Count: 27.2 K/uL — ABNORMAL HIGH (ref 4.0–10.5)
nRBC: 0 % (ref 0.0–0.2)

## 2023-10-09 LAB — CMP (CANCER CENTER ONLY)
ALT: 72 U/L — ABNORMAL HIGH (ref 0–44)
AST: 22 U/L (ref 15–41)
Albumin: 3.9 g/dL (ref 3.5–5.0)
Alkaline Phosphatase: 73 U/L (ref 38–126)
Anion gap: 8 (ref 5–15)
BUN: 33 mg/dL — ABNORMAL HIGH (ref 8–23)
CO2: 26 mmol/L (ref 22–32)
Calcium: 9.4 mg/dL (ref 8.9–10.3)
Chloride: 106 mmol/L (ref 98–111)
Creatinine: 1.08 mg/dL (ref 0.61–1.24)
GFR, Estimated: >60 mL/min (ref >60)
Glucose, Bld: 210 mg/dL — ABNORMAL HIGH (ref 70–99)
Potassium: 3.9 mmol/L (ref 3.5–5.1)
Sodium: 140 mmol/L (ref 135–145)
Total Bilirubin: 0.4 mg/dL (ref 0.0–1.2)
Total Protein: 7 g/dL (ref 6.5–8.1)

## 2023-10-09 LAB — LACTATE DEHYDROGENASE: LDH: 187 U/L (ref 98–192)

## 2023-10-09 MED ORDER — HEPARIN SOD (PORK) LOCK FLUSH 100 UNIT/ML IV SOLN
500.0000 [IU] | Freq: Once | INTRAVENOUS | Status: AC
Start: 2023-10-09 — End: 2023-10-09
  Administered 2023-10-09: 500 [IU]

## 2023-10-09 MED ORDER — SODIUM CHLORIDE 0.9% FLUSH
10.0000 mL | Freq: Once | INTRAVENOUS | Status: AC
  Administered 2023-10-09: 10 mL

## 2023-10-09 NOTE — Progress Notes (Signed)
 [facility] at [facility] [address]  [place], [place] [address] [phone]   New Patient Evaluation  Date of Service: 10/09/23 Patient Name: Elijah [name] Patient MRN: [id number] Patient DOB: [birth year] Provider: Arthea MARLA Manns, MD  Identifying Statement:  Elijah [name] is a 68 y.o. 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  Elijah [name] presents today for evaluation of recent neurologic symptoms and findings.  Elijah describes 2 weeks history of progressive left sided weakness, involving both the arm and leg.  Even over the past 2 days, the symptoms have worsened, despite decadron  being dosed at 8mg  per day for the past week.  Elijah did undergo brain biopsy on 7/3 with Dr. Colon without significant complication.  Currently not able to walk on his own, needs to use a walker or hold on to his wife for support.  Has no meaningful use of the left arm or hand.  Also struggling with  coordination in his right hand, overall memory and energy are quite poor.  Medications: Current Outpatient Medications on File Prior to Visit  Medication Sig Dispense Refill   Accu-Chek Softclix Lancets lancets Use daily before breakfast. 100 each 0   albuterol  (VENTOLIN  HFA) 108 (90 Base) MCG/ACT inhaler Inhale 2 puffs into the lungs every 4 (four) hours as needed. 6.7 g 0   alfuzosin  (UROXATRAL ) 10 MG 24 hr tablet Take 1 tablet (10 mg total) by mouth daily. 90 tablet 0   allopurinol  (ZYLOPRIM ) 300 MG tablet Take 1 tablet (300 mg total) by mouth daily. 90 tablet 1   ALPRAZolam  (XANAX ) 0.5 MG tablet Take 1 tablet (0.5 mg total) by mouth 2 (two) times daily as needed for sleep or anxiety. 60 tablet 0   amoxicillin -clavulanate (AUGMENTIN ) 875-125 MG tablet Take 1 tablet (875 mg total) by mouth every 12 (twelve) hours for 120 days 60 tablet 3   Blood Glucose Monitoring Suppl (ACCU-CHEK GUIDE) w/Device KIT Use daily before breakfast. 1 kit 0   CINNAMON PO Take 1 capsule by mouth daily.     dexamethasone  (DECADRON ) 2 MG tablet Take 1 tablet (2 mg total) by mouth 2 (two) times daily. 60 tablet 1   DULoxetine  (CYMBALTA ) 30 MG capsule Take 1 capsule (30 mg total) by mouth daily. 30 capsule 3   finasteride  (PROSCAR ) 5 MG tablet Take 1 tablet (5 mg total) by mouth daily. 90 tablet 3   gabapentin  (NEURONTIN ) 300 MG capsule Take 1 capsule (300 mg total) by mouth at bedtime. 30 capsule 1  glucose blood (ACCU-CHEK GUIDE TEST) test strip Use daily before breakfast. 50 each 0   Glucose Blood (BLOOD GLUCOSE TEST STRIPS) STRP Use to check blood sugar before breakfast as directed 100 strip 0   lidocaine -prilocaine  (EMLA ) cream Apply 1 Application topically as needed. (Patient taking differently: Apply 1 Application topically as needed (for port).) 30 g 0   loratadine  (CLARITIN ) 10 MG tablet Take 10 mg by mouth daily.     methocarbamol  (ROBAXIN ) 500 MG tablet Take 2 tablets (1,000 mg total) by mouth every 8  (eight) hours as needed for muscle spasms. 60 tablet 1   ondansetron  (ZOFRAN ) 8 MG tablet Take 1 tablet (8 mg total) by mouth every 8 (eight) hours as needed. 30 tablet 0   oxyCODONE  (OXY IR/ROXICODONE ) 5 MG immediate release tablet Take 1-2 tablets (5-10 mg total) by mouth every 6 (six) hours as needed for severe pain (pain score 7-10). 180 tablet 0   pantoprazole  (PROTONIX ) 40 MG tablet Take 1 tablet (40 mg total) by mouth daily 30 minutes to 1 hour before morning meal. 30 tablet 5   Probiotic TBEC Take 1 capsule by mouth daily.     prochlorperazine  (COMPAZINE ) 10 MG tablet Take 1 tablet (10 mg total) by mouth every 6 (six) hours as needed for nausea or vomiting. 30 tablet 0   No current facility-administered medications on file prior to visit.    Allergies:  Allergies  Allergen Reactions   Singulair [Montelukast] Hives   Amlodipine  Other (See Comments)    agitation   Crestor [Rosuvastatin] Other (See Comments)    Muscle Aches    Past Medical History:  Past Medical History:  Diagnosis Date   Acid reflux    Allergic rhinitis    Asthma    Bloating    BPH (benign prostatic hyperplasia)    Depression    ED (erectile dysfunction)    Epigastric pain    Exercise-induced asthma    GERD (gastroesophageal reflux disease)    History of kidney stones    Hypercholesteremia    Migraine 2009   Polymyalgia rheumatica (HCC)    Prediabetes    started on metformin  during january hospitalization   Prostate disease    Sleep apnea    Past Surgical History:  Past Surgical History:  Procedure Laterality Date   ANKLE SURGERY Right    APPLICATION OF CRANIAL NAVIGATION Left 10/02/2023   Procedure: COMPUTER-ASSISTED NAVIGATION, FOR CRANIAL PROCEDURE;  Surgeon: Colon Shove, MD;  Location: [facility] OR;  Service: Neurosurgery;  Laterality: Left;  BRAIN BIOPSY LEFT PERIATAL BRAIN WITH STEALTH GUIDANCE   BOWEL RESECTION N/A 04/10/2023   Procedure: SMALL BOWEL RESECTION WITH MASS;  Surgeon: Polly Cordella LABOR, MD;  Location: [facility] OR;  Service: General;  Laterality: N/A;   CHEST TUBE INSERTION Left    COLONOSCOPY     CYSTOSCOPY WITH RETROGRADE PYELOGRAM, URETEROSCOPY AND STENT PLACEMENT Left 06/29/2020   Procedure: CYSTOSCOPY WITH RETROGRADE PYELOGRAM, URETEROSCOPY , BASKET EXTRACTION, AND STENT PLACEMENT;  Surgeon: Alvaro Hummer, MD;  Location: [facility] ORS;  Service: Urology;  Laterality: Left;  1 HR   IR CV LINE INJECTION  07/25/2023   IR IMAGING GUIDED PORT INSERTION  05/13/2023   KNEE SURGERY Right    LAPAROTOMY N/A 04/10/2023   Procedure: EXPLORATORY LAPAROTOMY;  Surgeon: Polly Cordella LABOR, MD;  Location: [facility] OR;  Service: General;  Laterality: N/A;   PR DURAL GRAFT SPINAL Left 10/02/2023   Procedure: FRAMELESS STEREOTACTIC BIOPSY;  Surgeon: Colon Shove, MD;  Location: [facility] OR;  Service: Neurosurgery;  Laterality: Left;   SKIN CANCER EXCISION     unsure   TONSILLECTOMY     VASECTOMY     WISDOM TOOTH EXTRACTION     Social History:  Social History   Socioeconomic History   Marital status: Married    Spouse name: Not on file   Number of children: Not on file   Years of education: Not on file   Highest education level: Not on file  Occupational History   Not on file  Tobacco Use   Smoking status: Never    Passive exposure: Never   Smokeless tobacco: Never  Vaping Use   Vaping status: Never Used  Substance and Sexual Activity   Alcohol use: Yes    Comment: social   Drug use: Never   Sexual activity: Not on file  Other Topics Concern   Not on file  Social History Narrative   Not on file   Social Drivers of Health   Financial Resource Strain: Not on file  Food Insecurity: Unknown (10/02/2023)   Hunger Vital Sign    Worried About Running Out of Food in the Last Year: Never true    Ran Out of Food in the Last Year: Not on file  Transportation Needs: Unknown (10/02/2023)   PRAPARE - Administrator, Civil Service (Medical): No    Lack of Transportation  (Non-Medical): Not on file  Physical Activity: Not on file  Stress: Not on file  Social Connections: Socially Integrated (06/04/2023)   Social Connection and Isolation Panel    Frequency of Communication with Friends and Family: More than three times a week    Frequency of Social Gatherings with Friends and Family: Three times a week    Attends Religious Services: More than 4 times per year    Active Member of Clubs or Organizations: No    Attends Banker Meetings: 1 to 4 times per year    Marital Status: Married  Catering manager Violence: Unknown (10/02/2023)   Humiliation, Afraid, Rape, and Kick questionnaire    Fear of Current or Ex-Partner: No    Emotionally Abused: Not on file    Physically Abused: Not on file    Sexually Abused: Not on file   Family History:  Family History  Problem Relation Age of Onset   Healthy Son    Healthy Daughter     Review of Systems: Constitutional: Doesn't report fevers, chills or abnormal weight loss Eyes: Doesn't report blurriness of vision Ears, nose, mouth, throat, and face: Doesn't report sore throat Respiratory: Doesn't report cough, dyspnea or wheezes Cardiovascular: Doesn't report palpitation, chest discomfort  Gastrointestinal:  Doesn't report nausea, constipation, diarrhea GU: Doesn't report incontinence Skin: Doesn't report skin rashes Neurological: Per HPI Musculoskeletal: Doesn't report joint pain Behavioral/Psych: Doesn't report anxiety  Physical Exam: Wt Readings from Last 3 Encounters:  10/01/23 235 lb (106.6 kg)  09/08/23 240 lb 3.2 oz (109 kg)  08/19/23 236 lb 11.2 oz (107.4 kg)   Temp Readings from Last 3 Encounters:  10/09/23 (!) 97.3 F (36.3 C) (Temporal)  10/03/23 98.2 F (36.8 C) (Oral)  09/10/23 98.2 F (36.8 C) (Oral)   BP Readings from Last 3 Encounters:  10/09/23 (!) 154/104  10/03/23 128/68  09/12/23 (!) 154/82   Pulse Readings from Last 3 Encounters:  10/09/23 (!) 112  10/03/23 86   09/12/23 79   KPS: 60. General: Alert, cooperative, pleasant, in no acute distress Head: Normal EENT: No conjunctival injection  or scleral icterus.  Lungs: Resp effort normal Cardiac: Regular rate Abdomen: Non-distended abdomen Skin: No rashes cyanosis or petechiae. Extremities: No clubbing or edema  Neurologic Exam: Mental Status: Awake, alert, attentive to examiner. Oriented to self and environment. Language is fluent with intact comprehension.  Cranial Nerves: Visual acuity is grossly normal. Visual fields are full. Extra-ocular movements intact. No ptosis. Face is symmetric Motor: Tone and bulk are normal. Power is 2/5 in left arm, 3/5 in left leg. Reflexes are symmetric, no pathologic reflexes present.  Sensory: Intact to light touch Gait: Non ambulatory   Labs: I have reviewed the data as listed    Component Value Date/Time   NA 140 10/09/2023 1340   K 3.9 10/09/2023 1340   CL 106 10/09/2023 1340   CO2 26 10/09/2023 1340   GLUCOSE 210 (H) 10/09/2023 1340   BUN 33 (H) 10/09/2023 1340   CREATININE 1.08 10/09/2023 1340   CALCIUM 9.4 10/09/2023 1340   PROT 7.0 10/09/2023 1340   ALBUMIN  3.9 10/09/2023 1340   AST 22 10/09/2023 1340   ALT 72 (H) 10/09/2023 1340   ALKPHOS 73 10/09/2023 1340   BILITOT 0.4 10/09/2023 1340   GFRNONAA >60 10/09/2023 1340   GFRAA  06/17/2007 2140    >60        The eGFR has been calculated using the MDRD equation. This calculation has not been validated in all clinical   Lab Results  Component Value Date   WBC 27.2 (H) 10/09/2023   NEUTROABS 25.0 (H) 10/09/2023   HGB 11.6 (L) 10/09/2023   HCT 35.5 (L) 10/09/2023   MCV 94.4 10/09/2023   PLT 334 10/09/2023    Imaging:  MR BRAIN W WO CONTRAST Result Date: 10/02/2023 CLINICAL DATA:  Brain/CNS neoplasm, staging for sterotactic brain biopsy 1mm cuts and include whole face EXAM: MRI HEAD WITHOUT AND WITH CONTRAST TECHNIQUE: Multiplanar, multiecho pulse sequences of the brain and  surrounding structures were obtained without and with intravenous contrast. CONTRAST:  10mL GADAVIST  GADOBUTROL  1 MMOL/ML IV SOLN COMPARISON:  MRI of the head dated October 01, 2023. FINDINGS: Brain: Ill-defined lesions associated with restricted diffusion are again demonstrated within the right posterior frontal lobe and the left parietal lobe. There is irregular wispy enhancement of the lesion within the right frontal lobe. There is also enhancement within the adjacent cerebral sulci. There is enhancement within the sulci of the left parietal lesion as well. 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 There are scattered foci of increased T2 signal also seen throughout the cerebral hemispheres. Vascular: Normal flow voids.  The venous sinuses are patent. Skull and upper cervical spine: Normal marrow signal. No osseous lesions. Sinuses/Orbits: The paranasal sinuses and orbits are unremarkable. Other: None. IMPRESSION: 1. Ill-defined lesions are again demonstrated within the right posterior frontal lobe and the left parietal lobe, which again demonstrated restricted diffusion. 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 Lumbar puncture for CSF analysis might be considered. An MRI of the total spine without and with gadolinium contrast is also suggested to assess for drop metastatic disease. Electronically Signed   By: Evalene Coho M.D.   On: 10/02/2023 14:21   EEG adult Result Date: 10/02/2023 Shelton Arlin KIDD, MD     10/02/2023  9:07 AM Patient Name: Elijah [name] MRN: [id number] Epilepsy Attending: Arlin KIDD Shelton Referring Physician/Provider: Raenelle Donalda HERO, MD Date: 10/01/2023 Duration: 29.10 mins Patient history:  68 y.o. male presented to the  ED with worsening left-sided weakness.  EEG to evaluate for seizure. Level of alertness: Awake AEDs during EEG study: None Technical aspects: This EEG study was done with scalp  electrodes positioned according to the 10-20 International system of electrode placement. Electrical activity was reviewed with band pass filter of 1-70Hz , sensitivity of 7 uV/mm, display speed of 63mm/sec with a 60Hz  notched filter applied as appropriate. EEG data were recorded continuously and digitally stored.  Video monitoring was available and reviewed as appropriate. Description: The posterior dominant rhythm consists of 8Hz  activity of moderate voltage (25-35 uV) seen predominantly in posterior head regions, symmetric and reactive to eye opening and eye closing. EEG showed intermittent generalized 5 to 6 Hz theta slowing. Hyperventilation and photic stimulation were not performed.   ABNORMALITY - Intermittent slow, generalized IMPRESSION: This study is suggestive of mild diffuse encephalopathy. No seizures or epileptiform discharges were seen throughout the recording. Arlin MALVA Krebs   MR Brain W and Wo Contrast Result Date: 10/01/2023 CLINICAL DATA:  Metastatic disease evaluation brain met on ct EXAM: MRI HEAD WITHOUT AND WITH CONTRAST TECHNIQUE: Multiplanar, multiecho pulse sequences of the brain and surrounding structures were obtained without and with intravenous contrast. CONTRAST:  10mL GADAVIST  GADOBUTROL  1 MMOL/ML IV SOLN COMPARISON:  CT of the head dated October 01, 2023 and MRI of the brain dated Aug 19, 2005. FINDINGS: Brain: There is an amorphous area of enhancement present within the right posterior frontal lobe, that corresponds with the area of abnormally increased density noted on the previous CT. There is also a similar area of abnormal enhancement and surrounding edema within the left parietal lobe in the region of increased density and sulcal effacement. There is also a small focal area of irregular enhancement within the left frontal centrum semiovale, which appears to be related to a developmental venous anomaly. There is increased T2 signal and sulcal effacement involving the right  frontal lobe and left parietal lesions. Both lesions are mildly hyperintense on diffusion. 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 Vascular: There are normal vascular flow voids present. Skull and upper cervical spine: Normal marrow signal. No osseous lesions. Sinuses/Orbits: Clear paranasal sinuses.  Normal orbits. Other: None. IMPRESSION: 1. There are ill-defined lesions present within the right frontal lobe and left parietal lobe, which correspond with the areas of increased density/cortical thickening noted on the previous CT. 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 Metastatic disease is considered significantly less likely. 2. 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 It was present back in 2007, but is more conspicuous on the current exam. Electronically Signed   By: Evalene Coho M.D.   On: 10/01/2023 15:26   CT ABDOMEN PELVIS W CONTRAST Result Date: 10/01/2023 CLINICAL DATA:  Abdominal pain, left-sided weakness history of T-cell lymphoma * Tracking Code: BO * EXAM: CT ABDOMEN AND PELVIS WITH CONTRAST CT LUMBAR SPINE WITH CONTRAST TECHNIQUE: Multidetector CT imaging of the abdomen and pelvis was performed using the standard protocol following bolus administration of intravenous contrast. Multidetector CT imaging of the lumbar spine was performed using the standard protocol following bolus administration of intravenous contrast. RADIATION DOSE REDUCTION: This exam was performed according to the departmental dose-optimization program which includes automated exposure control, adjustment of the mA and/or kV according to patient size and/or  use of iterative reconstruction technique. CONTRAST:  75mL OMNIPAQUE  IOHEXOL  350 MG/ML  SOLN COMPARISON:  07/25/2023 FINDINGS: CT ABDOMEN PELVIS FINDINGS Lower chest: No acute findings. Hepatobiliary: No solid liver abnormality is seen. Hepatic steatosis. No gallstones, gallbladder wall thickening, or biliary dilatation. Pancreas: Unremarkable. No pancreatic ductal dilatation or surrounding inflammatory changes. Spleen: Normal in size without significant abnormality. Adrenals/Urinary Tract: Adrenal glands are unremarkable. 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 Nonobstructive calculus of the inferior pole of the left kidney. Mild left hydronephrosis, the proximal left ureter appears to be compressed by overlying hemorrhage. No ureteral calculus or other obstruction to the ureterovesicular junction. Simple benign right renal cortical cyst, for which no further follow-up or characterization is required. Right kidney is otherwise normal, without renal calculi, solid lesion, or hydronephrosis. Bladder is unremarkable. Stomach/Bowel: Stomach is within normal limits. Appendix appears normal. No evidence of bowel wall thickening, distention, or inflammatory changes. Vascular/Lymphatic: Aortic atherosclerosis. No enlarged abdominal or pelvic lymph nodes. Reproductive: Prostatomegaly. Other: No abdominal wall hernia or abnormality. No ascites. Musculoskeletal: No acute osseous findings. CT LUMBAR SPINE FINDINGS Alignment: Normal lumbar lordosis. Vertebral bodies: Intact. No fracture or dislocation. Disc spaces: Focally moderate disc space height loss and osteophytosis of L5-S1 with otherwise minimal lumbar disc degenerative change. Mild facet degenerative change of the lower lumbar levels. Paraspinous soft tissues: Unremarkable. IMPRESSION: 1. Internal hemorrhage and perinephric hematoma associated with a previously seen  macroscopic fat containing left renal angiomyolipoma, expanded by hematoma to measure 5.4 x 4.6 cm on today's examination. 2. Mild left hydronephrosis, the proximal left ureter appears to be compressed by overlying hemorrhage. No ureteral calculus or other obstruction to the ureterovesicular junction. 3. Nonobstructive calculus of the inferior pole of the left kidney. 4. Hepatic steatosis. 5. Prostatomegaly. 6. No fracture or dislocation of the lumbar spine. 7. Focally moderate disc degenerative change at L5-S1. Aortic Atherosclerosis (ICD10-I70.0). Electronically Signed   By: Marolyn JONETTA Jaksch M.D.   On: 10/01/2023 12:52   CT L-SPINE NO CHARGE Result Date: 10/01/2023 CLINICAL DATA:  Abdominal pain, left-sided weakness history of T-cell lymphoma * Tracking Code: BO * EXAM: CT ABDOMEN AND PELVIS WITH CONTRAST CT LUMBAR SPINE WITH CONTRAST TECHNIQUE: Multidetector CT imaging of the abdomen and pelvis was performed using the standard protocol following bolus administration of intravenous contrast. Multidetector CT imaging of the lumbar spine was performed using the standard protocol following bolus administration of intravenous contrast. RADIATION DOSE REDUCTION: This exam was performed according to the departmental dose-optimization program which includes automated exposure control, adjustment of the mA and/or kV according to patient size and/or use of iterative reconstruction technique. CONTRAST:  75mL OMNIPAQUE  IOHEXOL  350 MG/ML SOLN COMPARISON:  07/25/2023 FINDINGS: CT ABDOMEN PELVIS FINDINGS Lower chest: No acute findings. Hepatobiliary: No solid liver abnormality is seen. Hepatic steatosis. No gallstones, gallbladder wall thickening, or biliary dilatation. Pancreas: Unremarkable. No pancreatic ductal dilatation or surrounding inflammatory changes. Spleen: Normal in size without significant abnormality. Adrenals/Urinary Tract: Adrenal glands are unremarkable. 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 Nonobstructive calculus of the inferior pole of the left kidney. Mild left hydronephrosis, the proximal left ureter appears to be compressed by overlying hemorrhage. No ureteral calculus or other obstruction to the ureterovesicular junction.  Simple benign right renal cortical cyst, for which no further follow-up or characterization is required. Right kidney is otherwise normal, without renal calculi, solid lesion, or hydronephrosis. Bladder is unremarkable. Stomach/Bowel: Stomach is within normal limits. Appendix appears normal. No evidence of bowel wall thickening, distention, or inflammatory changes. Vascular/Lymphatic: Aortic atherosclerosis. No enlarged abdominal or pelvic lymph nodes. Reproductive: Prostatomegaly. Other: No abdominal wall hernia or abnormality. No ascites. Musculoskeletal: No acute osseous findings. CT LUMBAR SPINE FINDINGS Alignment: Normal lumbar lordosis. Vertebral bodies: Intact. No fracture or dislocation. Disc spaces: Focally moderate disc space height loss and osteophytosis of L5-S1 with otherwise minimal lumbar disc degenerative change. Mild facet degenerative change of the lower lumbar levels. Paraspinous soft tissues: Unremarkable. IMPRESSION: 1. 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 2. Mild left hydronephrosis, the proximal left ureter appears to be compressed by overlying hemorrhage. No ureteral calculus or other obstruction to the ureterovesicular junction. 3. Nonobstructive calculus of the inferior pole of the left kidney. 4. Hepatic steatosis. 5. Prostatomegaly. 6. No fracture or dislocation of the lumbar spine. 7. Focally moderate disc degenerative  change at L5-S1. Aortic Atherosclerosis (ICD10-I70.0). Electronically Signed   By: Marolyn JONETTA Jaksch M.D.   On: 10/01/2023 12:52   DG Femur Min 2 Views Left Result Date: 10/01/2023 CLINICAL DATA:  fall, left thigh pain EXAM: LEFT FEMUR 2 VIEWS COMPARISON:  None Available. FINDINGS: No acute fracture or dislocation. There is no evidence of arthropathy or other focal bone abnormality. Peripheral vascular atherosclerosis. Couple of surgical clips are noted in the proximal and medial thigh. IMPRESSION: No acute fracture or dislocation. Electronically Signed   By: Rogelia Myers M.D.   On: 10/01/2023 12:27   CT Head Wo Contrast Result Date: 10/01/2023 CLINICAL DATA:  Neuro deficit, concern for stroke, left-sided weakness for 3 weeks. EXAM: CT HEAD WITHOUT CONTRAST TECHNIQUE: Contiguous axial images were obtained from the base of the skull through the vertex without intravenous contrast. RADIATION DOSE REDUCTION: This exam was performed according to the departmental dose-optimization program which includes automated exposure control, adjustment of the mA and/or kV according to patient size and/or use of iterative reconstruction technique. COMPARISON:  CT head 06/17/2007. FINDINGS: Brain: Abnormal appearance of cortex within the right frontoparietal lobes near the vertex with suggestion of cortical thickening and mild hyperattenuation. There is associated sulcal effacement and local mass effect. Possible mild vasogenic edema in the posterior right frontal lobe. No definite encephalomalacia. Additional region of masslike soft tissue and possible cortical thickening involving the posterior left parietal lobe. No acute intracranial hemorrhage. Possible remote lacunar infarcts in the right caudate head and right anterior limb of the internal capsule. Nonspecific hypoattenuation in the periventricular and subcortical white matter favored to reflect chronic microvascular ischemic changes. Ventricles are unremarkable. No midline  shift. Basilar cisterns are patent. Vascular: No hyperdense vessel or unexpected calcification. Skull: Normal. Negative for fracture or focal lesion. Sinuses/Orbits: No acute finding. Other: None. IMPRESSION: Foci of masslike soft tissue and cortical thickening involving the posterior right frontal lobe near the vertex and the left parietal lobe with local mass effect and sulcal effacement. Findings concerning for metastatic disease versus primary CNS neoplasm. Recommend MRI head with and without contrast for further evaluation. No acute intracranial hemorrhage. 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 Electronically Signed   By: Donnice Mania M.D.   On:  10/01/2023 12:16   DG Chest 1 View Result Date: 10/01/2023 CLINICAL DATA:  Pain after fall EXAM: CHEST  1 VIEW portable semi upright COMPARISON:  X-ray 06/03/2023. FINDINGS: No consolidation, pneumothorax or effusion. Normal cardiopericardial silhouette. Right IJ chest port with tip along the right atrium. Film is under penetrated. Overlapping cardiac leads. IMPRESSION: No acute cardiopulmonary disease.  Chest port. Electronically Signed   By: Ranell Bring M.D.   On: 10/01/2023 11:56    [facility] Clinician Interpretation: I have personally reviewed the radiological images as listed.  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  Steroids have been ineffective at treating the symptoms even at higher doses.  Elijah has plans to initiate MATRIX chemotherapy protocol with [facility] oncology team; the high dose methotrexate component of this will be an essential first step in addressing CNS burden of disease.  Because of his rapid decline in motor function, it is likely that tumor  has infiltrated beyond what was visible on MRI from 7 days ago.  Because of this rapid progression, Methotrexate should be initiated ASAP.  Also discussed and recommended obtaining a total spine MRI mets screening protocol study, this could be done while inpatient at [facility] during first cycle of Methotrexate.  Decadron  can be decreased to 4mg  daily for now due to poor efficacy and side effects, this should be continued as a taper over the next few weeks by his treating team.    We spent twenty additional minutes teaching regarding the natural history, biology, and historical experience in the treatment of neurologic complications of cancer.   We appreciate the opportunity to participate in the care of Elijah [name].  Follow up us  TBD based on timing and tolerance of treatment protocol.  We will continue to touch base with Dr. Federico in the interim.  All questions were answered. The patient knows to call the clinic with any problems, questions or concerns. No barriers to learning were detected.  The total time spent in the encounter was 60 minutes and more than 50% was on counseling and review of test results   Arthea MARLA Manns, MD Medical Director of Neuro-Oncology [facility] 10/09/23 4:10 PM

## 2023-10-09 NOTE — Progress Notes (Signed)
 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  CT abdomen/pelvis showed shows a mass in the left abdomen associated with the small bowel, along with pneumoperitoneum. Underwent exploratory laparotomy and small bowel resection. 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  04/23/2023: Established care with [facility] Hematology/Oncology. 05/26/2023: Cycle 1 Day of CHOP chemotherapy 06/17/2023: Cycle 2 Day 1 of CHOP chemotherapy 07/07/2023: Cycle 3 Day 1 of CHOP chemotherapy 07/28/2023: Cycle 1 Day of CHOEP chemotherapy(added etop per [facility] request) 08/19/2023: Cycle 2 Day of CHOEP 09/08/2023: Cycle 3 Day of CHOEP  Interval History:  Elijah [name] 68 y.o. male with medical history significant for T cell lymphoma who presents for a follow up visit. The patient's last visit was on 08/19/2023. In the interim since the last visit, he completed cycle 3 of CHOEP but was hospitalized with new neurological symptoms and found to have new CNS involvement of his T-cell lymphoma.  On exam today Elijah [name] reports he is having paresthesias of his left side and is rapidly worsening.  He is not able to lift his left arm and is having trouble with his left leg.  He reports that he is having headaches and the headaches are about 6 or 7 out of 10.  He reports that he is still eating and drinking but not as well as he would like.  He reports that his depth perception is an issue and he is knocking things over when reaching for them.  He has had some bouts of confusion as  well.  Overall he does appear to be declining.  We discussed options moving forward and how his care was reviewed with [facility].  The bulk of our discussion focused on steps moving forward.  We discussed the MATRIX chemotherapy regimen and the expected side effects and duration of hospitalization.  He voiced his understanding of the plan.  We also discussed [facility] and [facility] is offering to provide this for him.  We also noted that if he were to have worsening he should present to emergency department urgently, but if there is time going to [facility] to be admitted to start chemotherapy would be his best option.  He voiced understanding of our findings and recommendations.  MEDICAL HISTORY:  Past Medical History:  Diagnosis Date   Acid reflux    Allergic rhinitis    Asthma    Bloating    BPH (benign prostatic hyperplasia)    Depression    ED (erectile dysfunction)    Epigastric pain    Exercise-induced asthma    GERD (gastroesophageal reflux disease)    History of kidney stones    Hypercholesteremia    Migraine 2009   Polymyalgia rheumatica (HCC)    Prediabetes    started on metformin  during january hospitalization   Prostate disease    Sleep apnea     SURGICAL HISTORY: Past Surgical History:  Procedure Laterality Date   ANKLE SURGERY Right    APPLICATION OF CRANIAL NAVIGATION Left 10/02/2023   Procedure: COMPUTER-ASSISTED NAVIGATION, FOR CRANIAL PROCEDURE;  Surgeon: Colon Victory, MD;  Location: [facility] OR;  Service: Neurosurgery;  Laterality: Left;  BRAIN BIOPSY LEFT PERIATAL BRAIN WITH STEALTH GUIDANCE   BOWEL RESECTION N/A 04/10/2023   Procedure: SMALL BOWEL RESECTION WITH MASS;  Surgeon: Polly Cordella LABOR, MD;  Location: [facility] OR;  Service: General;  Laterality: N/A;   CHEST TUBE INSERTION Left    COLONOSCOPY     CYSTOSCOPY WITH RETROGRADE PYELOGRAM, URETEROSCOPY AND STENT PLACEMENT Left 06/29/2020   Procedure: CYSTOSCOPY WITH RETROGRADE PYELOGRAM,  URETEROSCOPY , BASKET EXTRACTION, AND STENT PLACEMENT;  Surgeon: Alvaro Hummer, MD;  Location: [facility] OR[facility];  Service: Urology;  Laterality: Left;  1 HR   IR CV LINE INJECTION  07/25/2023   IR IMAGING GUIDED PORT INSERTION  05/13/2023   KNEE SURGERY Right    LAPAROTOMY N/A 04/10/2023   Procedure: EXPLORATORY LAPAROTOMY;  Surgeon: Polly Cordella LABOR, MD;  Location: [facility] OR;  Service: General;  Laterality: N/A;   PR DURAL GRAFT SPINAL Left 10/02/2023   Procedure: FRAMELESS STEREOTACTIC BIOPSY;  Surgeon: Colon Shove, MD;  Location: [facility] OR;  Service: Neurosurgery;  Laterality: Left;   SKIN CANCER EXCISION     unsure   TONSILLECTOMY     VASECTOMY     WISDOM TOOTH EXTRACTION      SOCIAL HISTORY: Social History   Socioeconomic History   Marital status: Married    Spouse name: Not on file   Number of children: Not on file   Years of education: Not on file   Highest education level: Not on file  Occupational History   Not on file  Tobacco Use   Smoking status: Never    Passive exposure: Never   Smokeless tobacco: Never  Vaping Use   Vaping status: Never Used  Substance and Sexual Activity   Alcohol use: Yes    Comment: social   Drug use: Never   Sexual activity: Not on file  Other Topics Concern   Not on file  Social History Narrative   Not on file   Social Drivers of Health   Financial Resource Strain: Not on file  Food Insecurity: Unknown (10/02/2023)   Hunger Vital Sign    Worried About Running Out of Food in the Last Year: Never true    Ran Out of Food in the Last Year: Not on file  Transportation Needs: Unknown (10/02/2023)   PRAPARE - Administrator, Civil Service (Medical): No    Lack of Transportation (Non-Medical): Not on file  Physical Activity: Not on file  Stress: Not on file  Social Connections: Socially Integrated (06/04/2023)   Social Connection and Isolation Panel    Frequency of Communication with Friends and Family: More than three times a week     Frequency of Social Gatherings with Friends and Family: Three times a week    Attends Religious Services: More than 4 times per year    Active Member of Clubs or Organizations: No    Attends Banker Meetings: 1 to 4 times per year    Marital Status: Married  Catering manager Violence: Unknown (10/02/2023)   Humiliation, Afraid, Rape, and Kick questionnaire    Fear of Current or Ex-Partner: No    Emotionally Abused: Not on file    Physically Abused: Not on file    Sexually Abused: Not on file    FAMILY HISTORY: Family History  Problem Relation Age of Onset   Healthy Son    Healthy Daughter     ALLERGIES:  is allergic to singulair [montelukast], amlodipine , and crestor [rosuvastatin].  MEDICATIONS:  Current Outpatient Medications  Medication  Sig Dispense Refill   Accu-Chek Softclix Lancets lancets Use daily before breakfast. 100 each 0   albuterol  (VENTOLIN  HFA) 108 (90 Base) MCG/ACT inhaler Inhale 2 puffs into the lungs every 4 (four) hours as needed. 6.7 g 0   alfuzosin  (UROXATRAL ) 10 MG 24 hr tablet Take 1 tablet (10 mg total) by mouth daily. 90 tablet 0   allopurinol  (ZYLOPRIM ) 300 MG tablet Take 1 tablet (300 mg total) by mouth daily. 90 tablet 1   ALPRAZolam  (XANAX ) 0.5 MG tablet Take 1 tablet (0.5 mg total) by mouth 2 (two) times daily as needed for sleep or anxiety. 60 tablet 0   amoxicillin -clavulanate (AUGMENTIN ) 875-125 MG tablet Take 1 tablet (875 mg total) by mouth every 12 (twelve) hours for 120 days 60 tablet 3   Blood Glucose Monitoring Suppl (ACCU-CHEK GUIDE) w/Device KIT Use daily before breakfast. 1 kit 0   CINNAMON PO Take 1 capsule by mouth daily.     dexamethasone  (DECADRON ) 2 MG tablet Take 1 tablet (2 mg total) by mouth 2 (two) times daily. 60 tablet 1   DULoxetine  (CYMBALTA ) 30 MG capsule Take 1 capsule (30 mg total) by mouth daily. 30 capsule 3   finasteride  (PROSCAR ) 5 MG tablet Take 1 tablet (5 mg total) by mouth daily. 90 tablet 3   gabapentin   (NEURONTIN ) 300 MG capsule Take 1 capsule (300 mg total) by mouth at bedtime. 30 capsule 1   glucose blood (ACCU-CHEK GUIDE TEST) test strip Use daily before breakfast. 50 each 0   Glucose Blood (BLOOD GLUCOSE TEST STRIPS) STRP Use to check blood sugar before breakfast as directed 100 strip 0   lidocaine -prilocaine  (EMLA ) cream Apply 1 Application topically as needed. (Patient taking differently: Apply 1 Application topically as needed (for port).) 30 g 0   loratadine  (CLARITIN ) 10 MG tablet Take 10 mg by mouth daily.     methocarbamol  (ROBAXIN ) 500 MG tablet Take 2 tablets (1,000 mg total) by mouth every 8 (eight) hours as needed for muscle spasms. 60 tablet 1   ondansetron  (ZOFRAN ) 8 MG tablet Take 1 tablet (8 mg total) by mouth every 8 (eight) hours as needed. 30 tablet 0   oxyCODONE  (OXY IR/ROXICODONE ) 5 MG immediate release tablet Take 1-2 tablets (5-10 mg total) by mouth every 6 (six) hours as needed for severe pain (pain score 7-10). 180 tablet 0   pantoprazole  (PROTONIX ) 40 MG tablet Take 1 tablet (40 mg total) by mouth daily 30 minutes to 1 hour before morning meal. 30 tablet 5   Probiotic TBEC Take 1 capsule by mouth daily.     prochlorperazine  (COMPAZINE ) 10 MG tablet Take 1 tablet (10 mg total) by mouth every 6 (six) hours as needed for nausea or vomiting. 30 tablet 0   No current facility-administered medications for this visit.    REVIEW OF SYSTEMS:   All other systems were reviewed with the patient and are negative.  PHYSICAL EXAMINATION: ECOG PERFORMANCE STATUS: 1 - Symptomatic but completely ambulatory  There were no vitals filed for this visit.     There were no vitals filed for this visit.      GENERAL: Well-appearing middle-age Caucasian male, alert, no distress and comfortable SKIN: skin color, texture, turgor are normal, no rashes or significant lesions EYES: conjunctiva are pink and non-injected, sclera clear LUNGS: clear to auscultation and percussion with  normal breathing effort HEART: regular rate & rhythm and no murmurs and no lower extremity edema Musculoskeletal: no cyanosis of digits and no clubbing  PSYCH: alert & oriented x 3, fluent speech NEURO: no focal motor/sensory deficits  LABORATORY DATA:  I have reviewed the data as listed    Latest Ref Rng & Units 10/03/2023    4:52 AM 10/02/2023    4:01 AM 10/01/2023    9:59 AM  CBC  WBC 4.0 - 10.5 K/uL 14.7  18.3  19.0   Hemoglobin 13.0 - 17.0 g/dL 9.1  9.9  89.6   Hematocrit 39.0 - 52.0 % 28.6  31.5  32.0   Platelets 150 - 400 K/uL 263  284  292        Latest Ref Rng & Units 10/03/2023    4:52 AM 10/02/2023    4:01 AM 10/01/2023    9:59 AM  CMP  Glucose 70 - 99 mg/dL 789  820  858   BUN 8 - 23 mg/dL 16  12  16    Creatinine 0.61 - 1.24 mg/dL 8.90  9.04  8.92   Sodium 135 - 145 mmol/L 139  137  141   Potassium 3.5 - 5.1 mmol/L 4.2  4.1  3.5   Chloride 98 - 111 mmol/L 106  104  108   CO2 22 - 32 mmol/L 24  21  24    Calcium 8.9 - 10.3 mg/dL 9.3  9.3  9.1   Total Protein 6.5 - 8.1 g/dL 6.2  6.5  6.4   Total Bilirubin 0.0 - 1.2 mg/dL 0.5  0.6  0.6   Alkaline Phos 38 - 126 U/L 50  60  61   AST 15 - 41 U/L 13  14  16    ALT 0 - 44 U/L 19  21  25      No results found for: MPROTEIN No results found for: KPAFRELGTCHN, LAMBDASER, KAPLAMBRATIO  RADIOGRAPHIC STUDIES: I have personally reviewed the radiological images as listed and agreed with the findings in the report. MR BRAIN W WO CONTRAST Result Date: 10/02/2023 CLINICAL DATA:  Brain/CNS neoplasm, staging for sterotactic brain biopsy 1mm cuts and include whole face EXAM: MRI HEAD WITHOUT AND WITH CONTRAST TECHNIQUE: Multiplanar, multiecho pulse sequences of the brain and surrounding structures were obtained without and with intravenous contrast. CONTRAST:  10mL GADAVIST  GADOBUTROL  1 MMOL/ML IV SOLN COMPARISON:  MRI of the head dated October 01, 2023. FINDINGS: Brain: Ill-defined lesions associated with restricted diffusion are again  demonstrated within the right posterior frontal lobe and the left parietal lobe. There is irregular wispy enhancement of the lesion within the right frontal lobe. There is also enhancement within the adjacent cerebral sulci. There is enhancement within the sulci of the left parietal lesion as well. 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 There are scattered foci of increased T2 signal also seen throughout the cerebral hemispheres. Vascular: Normal flow voids.  The venous sinuses are patent. Skull and upper cervical spine: Normal marrow signal. No osseous lesions. Sinuses/Orbits: The paranasal sinuses and orbits are unremarkable. Other: None. IMPRESSION: 1. Ill-defined lesions are again demonstrated within the right posterior frontal lobe and the left parietal lobe, which again demonstrated restricted diffusion. 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 Lumbar puncture for CSF analysis might be considered. An MRI of the total spine without and with gadolinium contrast is also suggested to assess for drop metastatic disease. Electronically Signed   By: Evalene Coho M.D.   On: 10/02/2023 14:21   EEG adult Result Date: 10/02/2023 Shelton Arlin KIDD, MD  10/02/2023  9:07 AM Patient Name: Elijah [name] MRN: [id number] Epilepsy Attending: Arlin MALVA Krebs Referring Physician/Provider: Raenelle Donalda HERO, MD Date: 10/01/2023 Duration: 29.10 mins Patient history:  68 y.o. male presented to the ED with worsening left-sided weakness.  EEG to evaluate for seizure. Level of alertness: Awake AEDs during EEG study: None Technical aspects: This EEG study was done with scalp electrodes positioned according to the 10-20 International system of electrode placement. Electrical activity was reviewed with band pass filter of 1-70Hz , sensitivity of 7 uV/mm, display speed of 66mm/sec with a 60Hz  notched filter applied as appropriate. EEG  data were recorded continuously and digitally stored.  Video monitoring was available and reviewed as appropriate. Description: The posterior dominant rhythm consists of 8Hz  activity of moderate voltage (25-35 uV) seen predominantly in posterior head regions, symmetric and reactive to eye opening and eye closing. EEG showed intermittent generalized 5 to 6 Hz theta slowing. Hyperventilation and photic stimulation were not performed.   ABNORMALITY - Intermittent slow, generalized IMPRESSION: This study is suggestive of mild diffuse encephalopathy. No seizures or epileptiform discharges were seen throughout the recording. Arlin MALVA Krebs   MR Brain W and Wo Contrast Result Date: 10/01/2023 CLINICAL DATA:  Metastatic disease evaluation brain met on ct EXAM: MRI HEAD WITHOUT AND WITH CONTRAST TECHNIQUE: Multiplanar, multiecho pulse sequences of the brain and surrounding structures were obtained without and with intravenous contrast. CONTRAST:  10mL GADAVIST  GADOBUTROL  1 MMOL/ML IV SOLN COMPARISON:  CT of the head dated October 01, 2023 and MRI of the brain dated Aug 19, 2005. FINDINGS: Brain: There is an amorphous area of enhancement present within the right posterior frontal lobe, that corresponds with the area of abnormally increased density noted on the previous CT. There is also a similar area of abnormal enhancement and surrounding edema within the left parietal lobe in the region of increased density and sulcal effacement. There is also a small focal area of irregular enhancement within the left frontal centrum semiovale, which appears to be related to a developmental venous anomaly. There is increased T2 signal and sulcal effacement involving the right frontal lobe and left parietal lesions. Both lesions are mildly hyperintense on diffusion. 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 Vascular: There  are normal vascular flow voids present. Skull and upper cervical spine: Normal marrow signal. No osseous lesions. Sinuses/Orbits: Clear paranasal sinuses.  Normal orbits. Other: None. IMPRESSION: 1. There are ill-defined lesions present within the right frontal lobe and left parietal lobe, which correspond with the areas of increased density/cortical thickening noted on the previous CT. 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 Metastatic disease is considered significantly less likely. 2. 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 It was present back in 2007, but is more conspicuous on the current exam. Electronically Signed   By: Evalene Coho M.D.   On: 10/01/2023 15:26   CT ABDOMEN PELVIS W CONTRAST Result Date: 10/01/2023 CLINICAL DATA:  Abdominal pain, left-sided weakness history of T-cell lymphoma * Tracking Code: BO * EXAM: CT ABDOMEN AND PELVIS WITH CONTRAST CT LUMBAR SPINE WITH CONTRAST TECHNIQUE: Multidetector CT imaging of the abdomen and pelvis was performed using the standard protocol following bolus administration of intravenous contrast. Multidetector CT imaging of the lumbar spine was performed using the standard  protocol following bolus administration of intravenous contrast. RADIATION DOSE REDUCTION: This exam was performed according to the departmental dose-optimization program which includes automated exposure control, adjustment of the mA and/or kV according to patient size and/or use of iterative reconstruction technique. CONTRAST:  75mL OMNIPAQUE  IOHEXOL  350 MG/ML SOLN COMPARISON:  07/25/2023 FINDINGS: CT ABDOMEN PELVIS FINDINGS Lower chest: No acute findings. Hepatobiliary: No solid liver abnormality is seen. Hepatic steatosis. No gallstones, gallbladder wall thickening, or biliary dilatation. Pancreas: Unremarkable. No  pancreatic ductal dilatation or surrounding inflammatory changes. Spleen: Normal in size without significant abnormality. Adrenals/Urinary Tract: Adrenal glands are unremarkable. 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 Nonobstructive calculus of the inferior pole of the left kidney. Mild left hydronephrosis, the proximal left ureter appears to be compressed by overlying hemorrhage. No ureteral calculus or other obstruction to the ureterovesicular junction. Simple benign right renal cortical cyst, for which no further follow-up or characterization is required. Right kidney is otherwise normal, without renal calculi, solid lesion, or hydronephrosis. Bladder is unremarkable. Stomach/Bowel: Stomach is within normal limits. Appendix appears normal. No evidence of bowel wall thickening, distention, or inflammatory changes. Vascular/Lymphatic: Aortic atherosclerosis. No enlarged abdominal or pelvic lymph nodes. Reproductive: Prostatomegaly. Other: No abdominal wall hernia or abnormality. No ascites. Musculoskeletal: No acute osseous findings. CT LUMBAR SPINE FINDINGS Alignment: Normal lumbar lordosis. Vertebral bodies: Intact. No fracture or dislocation. Disc spaces: Focally moderate disc space height loss and osteophytosis of L5-S1 with otherwise minimal lumbar disc degenerative change. Mild facet degenerative change of the lower lumbar levels. Paraspinous soft tissues: Unremarkable. IMPRESSION: 1. 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 2. Mild left hydronephrosis, the proximal left ureter appears to be compressed by overlying hemorrhage. No ureteral calculus or other obstruction to  the ureterovesicular junction. 3. Nonobstructive calculus of the inferior pole of the left kidney. 4. Hepatic steatosis. 5. Prostatomegaly. 6. No fracture or dislocation of the lumbar spine. 7. Focally moderate disc degenerative change at L5-S1. Aortic Atherosclerosis (ICD10-I70.0). Electronically Signed   By: Marolyn JONETTA Jaksch M.D.   On: 10/01/2023 12:52   CT L-SPINE NO CHARGE Result Date: 10/01/2023 CLINICAL DATA:  Abdominal pain, left-sided weakness history of T-cell lymphoma * Tracking Code: BO * EXAM: CT ABDOMEN AND PELVIS WITH CONTRAST CT LUMBAR SPINE WITH CONTRAST TECHNIQUE: Multidetector CT imaging of the abdomen and pelvis was performed using the standard protocol following bolus administration of intravenous contrast. Multidetector CT imaging of the lumbar spine was performed using the standard protocol following bolus administration of intravenous contrast. RADIATION DOSE REDUCTION: This exam was performed according to the departmental dose-optimization program which includes automated exposure control, adjustment of the mA and/or kV according to patient size and/or use of iterative reconstruction technique. CONTRAST:  75mL OMNIPAQUE  IOHEXOL  350 MG/ML SOLN COMPARISON:  07/25/2023 FINDINGS: CT ABDOMEN PELVIS FINDINGS Lower chest: No acute findings. Hepatobiliary: No solid liver abnormality is seen. Hepatic steatosis. No gallstones, gallbladder wall thickening, or biliary dilatation. Pancreas: Unremarkable. No pancreatic ductal dilatation or surrounding inflammatory changes. Spleen: Normal in size without significant abnormality. Adrenals/Urinary Tract: Adrenal glands are unremarkable. 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 Nonobstructive calculus of the inferior pole of the left kidney. Mild left hydronephrosis, the proximal  left ureter appears to be compressed by overlying hemorrhage. No ureteral calculus or other obstruction to the ureterovesicular junction. Simple benign right renal cortical cyst, for which no further follow-up or characterization is required. Right kidney is otherwise normal, without renal calculi, solid lesion, or hydronephrosis. Bladder is unremarkable. Stomach/Bowel: Stomach is within normal limits. Appendix appears normal. No evidence of bowel wall thickening, distention, or inflammatory changes. Vascular/Lymphatic: Aortic atherosclerosis. No enlarged abdominal or pelvic lymph nodes. Reproductive: Prostatomegaly. Other: No abdominal wall hernia or abnormality. No ascites. Musculoskeletal: No acute osseous findings. CT LUMBAR SPINE FINDINGS Alignment: Normal lumbar lordosis. Vertebral bodies: Intact. No fracture or dislocation. Disc spaces: Focally moderate disc space height loss and osteophytosis of L5-S1 with otherwise minimal lumbar disc degenerative change. Mild facet degenerative change of the lower lumbar levels. Paraspinous soft tissues: Unremarkable. IMPRESSION: 1. 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 2. Mild left hydronephrosis, the proximal left ureter appears to be compressed by overlying hemorrhage. No ureteral calculus or other obstruction to the ureterovesicular junction. 3. Nonobstructive calculus of the inferior pole of the left kidney. 4. Hepatic steatosis. 5. Prostatomegaly. 6. No fracture or dislocation of the lumbar spine. 7. Focally moderate disc degenerative change at L5-S1. Aortic Atherosclerosis (ICD10-I70.0). Electronically Signed   By: Marolyn JONETTA Jaksch M.D.   On: 10/01/2023 12:52   DG Femur Min 2 Views Left Result Date: 10/01/2023 CLINICAL DATA:  fall, left thigh pain EXAM: LEFT FEMUR 2 VIEWS COMPARISON:  None Available.  FINDINGS: No acute fracture or dislocation. There is no evidence of arthropathy or other focal bone abnormality. Peripheral vascular atherosclerosis. Couple of surgical clips are noted in the proximal and medial thigh. IMPRESSION: No acute fracture or dislocation. Electronically Signed   By: Rogelia Myers M.D.   On: 10/01/2023 12:27   CT Head Wo Contrast Result Date: 10/01/2023 CLINICAL DATA:  Neuro deficit, concern for stroke, left-sided weakness for 3 weeks. EXAM: CT HEAD WITHOUT CONTRAST TECHNIQUE: Contiguous axial images were obtained from the base of the skull through the vertex without intravenous contrast. RADIATION DOSE REDUCTION: This exam was performed according to the departmental dose-optimization program which includes automated exposure control, adjustment of the mA and/or kV according to patient size and/or use of iterative reconstruction technique. COMPARISON:  CT head 06/17/2007. FINDINGS: Brain: Abnormal appearance of cortex within the right frontoparietal lobes near the vertex with suggestion of cortical thickening and mild hyperattenuation. There is associated sulcal effacement and local mass effect. Possible mild vasogenic edema in the posterior right frontal lobe. No definite encephalomalacia. Additional region of masslike soft tissue and possible cortical thickening involving the posterior left parietal lobe. No acute intracranial hemorrhage. Possible remote lacunar infarcts in the right caudate head and right anterior limb of the internal capsule. Nonspecific hypoattenuation in the periventricular and subcortical white matter favored to reflect chronic microvascular ischemic changes. Ventricles are unremarkable. No midline shift. Basilar cisterns are patent. Vascular: No hyperdense vessel or unexpected calcification. Skull: Normal. Negative for fracture or focal lesion. Sinuses/Orbits: No acute finding. Other: None. IMPRESSION: Foci of masslike soft tissue and cortical thickening  involving the posterior right frontal lobe near the vertex and the left parietal lobe with local mass effect and sulcal effacement. Findings concerning for metastatic disease versus primary CNS neoplasm. Recommend MRI head with and  without contrast for further evaluation. No acute intracranial hemorrhage. 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 Electronically Signed   By: Donnice Mania M.D.   On: 10/01/2023 12:16   DG Chest 1 View Result Date: 10/01/2023 CLINICAL DATA:  Pain after fall EXAM: CHEST  1 VIEW portable semi upright COMPARISON:  X-ray 06/03/2023. FINDINGS: No consolidation, pneumothorax or effusion. Normal cardiopericardial silhouette. Right IJ chest port with tip along the right atrium. Film is under penetrated. Overlapping cardiac leads. IMPRESSION: No acute cardiopulmonary disease.  Chest port. Electronically Signed   By: Ranell Bring M.D.   On: 10/01/2023 11:56      ASSESSMENT & PLAN BARTLEY VUOLO 68 y.o. male with medical history significant for T cell lymphoma who presents for a follow up visit.    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 Mesenteric margin is positive.  --Discussed MEITL is a rare subtype of peripheral T-cell lymphoma, approximately less than 5% of cases.  --Reviewed that the treatment strategy will include CHOP based chemotherapy. Reviewed frequency and common side effects.  -- baseline PET imaging complete on 05/02/2023.  --Started CHOEP on 07/28/2023. Plan for repeat PET CT scan after Cycle 3 of CHOEP.  Ideally he will only require 3 cycles of CHOEP (he already received 3 cycles of CHOP) PLAN: --  Discussed his care with [facility], best move forward would be MATRIX chemotherapy --Labs from today show white blood cell 27.2, hemoglobin 11.6, MCV 94.4, platelets 334.  Creatinine and LFTs in range.  --his post treatment PET CT scan showed a complete response -- MRI brain unfortunately shows CNS/recurrence/relapse. This was confirmed via biopsy.  --Appreciate the recommendations and assistance from [facility] oncology --Continue dexamethasone  therapy --RTC pending recommendations for [facility].  Strict return precautions to return if he has worsening neurological symptoms.  Patient will be seeing our neuro oncologist later today.  #Upper respiratory congestion #Vertigo --Recommend to continue with claritin  and add nasonex and mucinex.   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    #Abdominal pain: --Improving after surgery.  --Drain removed. --Currently on oxycodone  5-10 mg q 4 hours  #Supportive Care -- chemotherapy education complete -- port placed --TTE complete, adequate for treatment.  -- zofran  8mg  q8H PRN and compazine  10mg  PO q6H for nausea -- acyclovir 400mg  PO BID for VCZ prophylaxis -- allopurinol  300mg  PO daily for TLS prophylaxis -- EMLA  cream for port   No orders of the defined types were placed in this encounter.   All questions were answered. The patient knows to call the clinic with any problems, questions or concerns.  A total of more than 30 minutes were spent on this encounter with face-to-face time and non-face-to-face time, including preparing to see the patient, ordering tests and/or medications, counseling the patient and coordination of care as outlined above.   Norleen IVAR Kidney, MD Department of Hematology/Oncology [facility] at [facility] Phone: [phone] Pager: [phone] Email: [email or web address]  10/09/2023  10:17 AM

## 2023-10-09 NOTE — Progress Notes (Signed)
 Transport w/c ordered through [employer]. Pt is unable to use current walker due to left sided paralysis. Pt and wife aware.

## 2023-10-09 NOTE — H&P (Signed)
 DEPARTMENT OF MEDICINE Hematology Oncology Service - History and Physical Exam Date: 10/09/2023 Patient Name: Elijah [name] MRN: [id number] Age/Sex: 68 y.o. / male Team: HO a PCP: No Name Unknown  HISTORY  Chief Complaint: extremity weakness  Reason for Admission:  Chief Complaint  Patient presents with  . Extremity Weakness    L leg, L arm weakness x 4-5 days. diagnosed with lymphoma and now with mets to the brain. here for work up/treatment. has been trying to have treatment at home, but he is falling, weak and needs further assistance.   History of Present Illness: Elijah [name] is a 68 y.o. male with past medical history of monomorphic epitheliotropic intestinal T-cell lymphoma with a positive mesenteric margin and ascites negative for malignant cells, GERD admitted for weakness and neurologic deficits.   Recently admitted to [facility] on 10/01/23 due to worsening left sided weakness for roughly 1 week. Neuroimaging in the ED showed ill-defined lesions of the right frontal and left parietal lobe, concerning for metastatic disease. During this admission, the patient underwent a brain biopsy which showed atypical lymphoid infiltrate with tumor cells positive CD3, CD56, and CD8, negative for CD4, CD 20 highlights rare background B cells, consistent with involvement from patients known T-cell lymphoma. EEG performed showed no epileptiform discharges. He received dexamethasone  10mg  IV q8h and was discharged on 2mg  orally BID. Discharge date was 10/03/23. Since his discharge, patient and his wife both report worsening neurologic symptoms including worsening weakness of the left arm and left leg, inability to ambulate unassisted, difficulty with fine motor movements such as typing, decreased grip strength, neuropathy with tingling in his fingers, cognition issues, and a spastic tremor. Gabapentin  improved the neuropathy. Due to these worsening symptoms, the patient was instructed by his  Oncologist to present to [facility] for chemo induction.   Denied chest pain, shortness of breath, abdominal pain, speech difficulties, dysuria, incontinence, burning on rashes, itching of rashes, worsening rashes.  Oncologic Diagnosis and History:  - 04/10/2023: Patient presented to the ED for initial episode of acute/severe abdominal pain with nausea, vomiting, and abdominal bloating. CT abdomen/pelvis shows a mass in the left abdomen associated within the small bowel and pneumoperitoneum. Patient unerwent exploratory laparotomy with small bowel resection (17cm) and mass resection. 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 Ascites negative for malignant cells. Mesenteric margin is positive. Patient discharged with a wound vac to the abdominal incision and on augmentin .  - 05/26/2023 C1D1 - CHOP - 06/03/2023 Hospitalized locally for neutropenic fevers, with source concerning for abdominal abcess. Abdominal drain re-inserted.  - 06/17/2023 - 07/23/2023 CHOP x 3 cycles - 06/25/2023 initial visit with [facility] in the [facility] - 07/22/2023 PET/CT s/p C3 CHOP Compared to postoperative baseline PET CT from May 02, 2023, favorable treatment related changes seen as described above. Significant interval contraction of the large hypoattenuating area in the left side of the abdomen with significant decrease in surrounding hypermetabolic activity. Residual 2 x 5 cm hypermetabolic activity with SUVmax 5.4 may be inflammatory (Deauville X) or residual neoplasm (Deauville 4). The hypermetabolic peripancreatic lymph node has resolved. No new hypermetabolic disease elsewhere. - 95/7974 - 09/08/2023 patient completed additional 3 cycles of chemotherapy with CHOEP with no additional hospitalization. Cycle 6 CHOEP received on 09/08/2023 - 09/23/2023 PET/CT CR  Family Contact: Extended Emergency Contact Information Primary  Emergency Contact: Patel,Karen Home Phone: [phone] Mobile Phone: [phone] Relation: Spouse Preferred language: English Interpreter needed? No   Past  Medical History[1]  Past Surgical History[2]  { Social History[3]  History reviewed. No pertinent family history.    Allergies  Allergen Reactions  . Amlodipine  Other (See Comments)    agitation  . Rosuvastatin Myalgias and Other (See Comments)    Muscle Aches  . Singulair Rash    Current Outpatient Medications  Medication Instructions  . acetaminophen  (TYLENOL ) 500 MG tablet   . albuterol  108 (90 Base) MCG/ACT inhaler 2 puffs  . alfuzosin  (UROXATRAL ) 10 mg, DAILY  . dexAMETHasone  (DECADRON ) 4 mg, 2 TIMES DAILY WITH MEALS  . finasteride  (PROSCAR ) 5 mg, DAILY  . gabapentin  (NEURONTIN ) 300 mg, 3 TIMES DAILY  . lidocaine -prilocaine  (EMLA ) cream 1 Application, As needed  . loratadine  (CLARITIN ) 10 mg, DAILY  . methocarbamol  (ROBAXIN ) 1,000 mg  . ondansetron  (ZOFRAN ) 8 mg  . oxyCODONE  (ROXICODONE ) 5-10 mg  . pantoprazole  (PROTONIX ) 40 mg, DAILY  . Probiotic TBEC 1 capsule, DAILY  . senna-docusate (SENOKOT-S) 8.6-50 MG per tablet    I have reviewed the past medical history, surgical history, family history, social history, medications and allergies and they are up to date and correctly populated.  Review Of Systems: 14 systems reviewed, pertinent ROS noted in HPI and all other systems negative.  Physical Exam:  Visit Vitals BP (!) 170/87 (Patient Position: Sitting)  Pulse 74  Temp 36.8 C (98.2 F) (Oral)  Resp 16  Wt (!) 105 kg (231 lb 7.7 oz)  SpO2 99%  BMI 30.55 kg/m   Physical Exam: Gen: Awake, alert HEENT: Normocephalic, atraumatic; mucous membranes moist. Slight right sided facial droop present.  CV: Regular rate and rhythm with no murmurs, gallops, or thrills Pulm: Chest rise is symmetric, lungs clear to auscultation bilaterally Abd: Soft, non-tender and non-distended, bowel sounds  present Neuro: AO3, CN II - XII grossly intact, finger-to-nose and heel-to-shin intact, strength in RUE 5/5 at shoulder, elbow, wrist, and fingers. LUE 0/5 unable to move L arm in any direction at shoulder, elbow, wrist, or fingers. RLE 5/5 with flexion and extension of hip, knee, and ankle. LLE 3/5 with flexion and extension of hip, knee, and ankle. Sensation and proprioception grossly intact in upper and lower extremities bilaterally.  Skin: Warm, dry, intact. Hemangioma present over right arm and lower left leg. Left flank hematoma with tenderness to palpation.    DIAGNOSTIC STUDIES  I have reviewed the patient's current/relevant laboratory, radiographic, and microbiological studies today.    Significant Findings, Abnormalities or Trends:   Labs:  Recent Labs    10/09/23 1949  WBC 21.05*  HGB 12.1*  HCT 38.6*  PLT 353   Recent Labs    10/09/23 1949  NEUTC 95.6  NEUTAC 20.12*  LYMPHOPCT 1.8  MONOPCT 2.7  EOSPCT 0.0  EOSABS 0.00  BASOPCT 0.0  BASOSABS 0.00         Recent Labs    10/09/23 1949  NA 141  K 4.4  CL 106  CO2 24  BUN 34*  CREATININE 1.0  GLU 191*  CALCIUM 9.6   Recent Labs    10/09/23 1949  ALB 4.3  BILITOT 0.5  ALT 96*  AST 31  ALKPHOS 83   Recent Labs    10/09/23 1949  ALB 4.3  BILITOT 0.5  ALT 96*  AST 31  ALKPHOS 83   Recent Labs    10/09/23 1949 10/10/23 0037  INR 1.0  --   PTT  --  25.1       Radiology: MRI brain with and without  contrast done at [facility] 10/02/2023:  Ill-defined lesions are again demonstrated within the right  posterior frontal lobe and the left parietal lobe, which again  demonstrated restricted diffusion. There is leptomeningeal  enhancement of the adjacent cerebral sulci. Lumbar puncture for CSF  analysis might be considered. An MRI of the total spine without and  with gadolinium contrast is also suggested to assess for drop  metastatic disease   Pathology: Brain biopsy, R frontal, left  parietal done at [facility] on 10/02/23:  Morphologic evaluation of the brain biopsy shows an atypical lymphoid infiltrate.  The lymphocytes are small to medium sized. Immunohistochemical stains reveal the tumor cells are positive for CD3, CD56 and CD8, and show loss of CD5.  Tumor cells are negative for CD4. CD20 highlights rare background B cells.  The findings are consistent with involvement by the patient's known T-cell lymphoma.   Assessment:  Addison Whidbee is a 68 y.o. male with past medical history of monomorphic epitheliotropic intestinal T-cell lymphoma with positive mesenteric margin and ascities negative for malignant cells, GERD admitted for worsening left sided weakness and neurologic deficits.   New onset and worsening neurologic deficit and left sided weakness secondary to metastatic brain lesions  Patient reports new onset weakness, tremor, difficulty with gross and fine motor movements since 6/29, no known deficits prior. Patient recently underwent MRI of the brain during admission at [facility] which showed ill-defined lesions in right posterior frontal lobe and left parietal lobe, with leptomeningeal enhancement concerning for metastatic disease. Patient underwent stereotactic biopsy on 7/3 at [facility] showing CD3, CD56, and CD8 positive T-cells consistent with involvement from known T-cell lymphoma. 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  -Dexamethasone  4mg  IV twice daily increased to loading with 10mg  Dex and then 6q 6 hours -Continue home gabapentin  for neuropathy  -Consult Neuro-Onc -Will obtain lumbar puncture with flow cytometry to evaluate for drop metastasis  -Obtaining MRI with and without contrast to evaluate metastatic lesions -Will need to initiate MATRIX without the etoposide  and rituximab    Monomorphic  epitheliotropic intestinal T-cell lymphoma with positive mesenteric margin and ascites negative for malignant cells  Leukocytosis and anemia from disease and treatment Follows with Dr. Federico at [facility], as well as Dr. Cristie. Diagnosed in January 2025 following CT scan showing left abdominal mass and pneumoperitoneum, underwent exploratory laparotomy at the time with small bowel resection and mass resection. S/p CHOP and CHOEP therapy, with cycle completion on 09/08/2023 and PET/CT showing disease remission on 09/23/23.   GERD -Continue home pantoprazole    Supportive care measure as clinically indicated  Medication regimen reviewed and discussed with pharmacist  Management reviewed and discussed with patient's primary hematologist (Dr. Cristie) over messaging system  ADMISSION CHECKLIST  Aspiration precautions indicated? No Risk of Encephalopathy: high risk VTE prophylaxis ordered? Heparin  Prophylaxis (any form) Telemetry indicated? No Heart failure present? No  There is no indication for end of life / goals of care discussion with patient and family  Jayson He, MD Internal Medicine  PGY-1 PICC [id number]  October 10, 2023  Attending Physician Attestation  I have personally interviewed and examined the patient on rounds 10/10/2023. I have reviewed the provider's history, review of systems, physical exam, assessment and treatment plans.  I concur with or have edited all elements of the provider's note.   Delphine Henle, MD, PhD Assistant Professor  Department of Medicine, Hematology/Oncology  [facility]  PIC: [phone]       [1] No past medical history on file. [2] History reviewed. No pertinent surgical history. [3] Social History Socioeconomic History  . Marital status: Married  Tobacco Use  . Smoking status: Never  . Smokeless tobacco: Never  Vaping Use  . Vaping status: Never Used  Substance and Sexual Activity  .  Alcohol use: Never  . Drug use: Never  . Sexual activity: Yes    Partners: Female   Social Drivers of Health   Food Insecurity: Unknown (10/02/2023)   Received from [facility]   Hunger Vital Sign   . Within the past 12 months, you worried that your food would run out before you got the money to buy more.: Never true  Transportation Needs: Unknown (10/02/2023)   Received from [facility] - Transportation   . In the past 12 months, has lack of transportation kept you from medical appointments or from getting medications?: No  Social Connections: Socially Integrated (06/04/2023)   Received from [facility]   Social Connection and Isolation Panel   . In a typical week, how many times do you talk on the phone with family, friends, or neighbors?: More than three times a week   . How often do you get together with friends or relatives?: Three times a week   . How often do you attend church or religious services?: More than 4 times per year   . Do you belong to any clubs or organizations such as church groups, unions, fraternal or athletic groups, or school groups?: No   . How often do you attend meetings of the clubs or organizations you belong to?: 1 to 4 times per year   . Are you married, widowed, divorced, separated, never married, or living with a partner?: Married  Intimate Partner Violence: Unknown (10/02/2023)   Received from [facility]   Humiliation, Afraid, Rape, and Kick questionnaire   . Within the last year, have you been afraid of your partner or ex-partner?: No  Housing Stability: Unknown (10/02/2023)   Received from [facility] Stability Vital Sign   . In the last 12 months, was there a time when you were not able to pay the mortgage or rent on time?: No

## 2023-10-10 ENCOUNTER — Other Ambulatory Visit: Payer: Self-pay | Admitting: Radiation Therapy

## 2023-10-11 ENCOUNTER — Other Ambulatory Visit: Payer: Self-pay

## 2023-10-11 NOTE — Progress Notes (Signed)
 DEPARTMENT OF MEDICINE Hematology Oncology Service - Progress Note  Date: 10/11/2023 Team:  HO b  Name: Elijah [name]   MRN: [id number] Admit date: 10/09/2023   Principal reason for admission: T Cell Lymphoma w/ Brain Metastases   SUBJECTIVE  Patient complaints/ events Past 24 Hours: - worsening LLE weakness overnight, can only wiggle toes on command  - no headaches, fevers, chills - continues on sodium bicarb gtt for UOP/urine alkalinzation for MTX  ROS otherwise negative  OBJECTIVE  Vitals Most Recent Temp: 36.9 C (98.4 F) Heart Rate: 78  BP: (!) 166/83 Resp: 19 SpO2: 96 % 24 Hour Min/Max Temp  Min: 36.6 C (97.9 F)  Max: 36.9 C (98.5 F) Pulse  Min: 72  Max: 94 BP  Min: 160/88  Max: 199/89 Resp  Min: 16  Max: 19 SpO2  Min: 94 %  Max: 98 %   I have reviewed the patient's vital signs and nursing notes.  CONSTITUTIONAL: No acute distress. Laying back in bed in beanie.  HEENT: clear OP, anicteric CARDIO: Regular rate and rhythm, no murmurs, rubs or gallops.  PULM: Breathing comfortably on RA. No dyspnea with conversation.  GI: soft, nontender, nondistended.  MSK: Warm and well perfused. No LE edema.   Neurologic Examination: Mental status: Alert & oriented to person, place, and time.   Language: Fluent language without paraphasic errors.   Cranial nerves: PERRL bilaterally. EOMI bilaterally; no nystagmus or saccadic intrusions present. Symmetric full sensation to light touch in CNV1-V3 distributions. Facial expression is symmetric at rest and with activation. Hearing is intact to conversation. Palate elevates symmetrically; uvula midline. No dysarthria. Tongue protrudes to midline. 1/5 L Shoulder shrug.     Motor: Normal bulk and tone present in all four extremities. No abnormal movements, tremors, or fasciculations.   Strength exam to confrontation as below.      Shoulder abduction Elbow flexion  Elbow extension     Grip   RIGHT  5  5  5  5    LEFT  1 0 0  0            Hip flexion  Knee flexion Knee extension  Dorsiflexion Plantar flexion  RIGHT  5  5  5  5  5    LEFT  0 0 0 0 0     Sensation: Intact in all four extremities to light touch. No extinction or neglect observed.  Coordination: Dysmetria bilaterally with finger nose testing. No dysmetria or ataxia heel-knee-shin testing.   I have reviewed the patient's current/relevant laboratory, radiographic, and microbiological studies today.  Significant Findings, Abnormalities or Trends:   ANC Trend Recent Labs    10/09/23 1949 10/11/23 0530  NEUTAC 20.12* 14.39*     Recent Labs    10/09/23 1949 10/10/23 0426 10/11/23 0530  WBC 21.05* 16.92* 15.61*  HGB 12.1* 10.2* 10.8*  HCT 38.6* 32.1* 33.0*  PLT 353 289 318   Recent Labs    10/09/23 1949 10/10/23 0426 10/11/23 0530  NA 141 140 137  K 4.4 3.7 4.1  CL 106 107 103  CO2 24 25 26   BUN 34* 35* 28*  CREATININE 1.0 1.0 0.9  GLU 191* 140* 180*  CALCIUM 9.6 8.8 8.7  MG  --  2.0 2.0  PHOS  --   --  4.5    Recent Labs    10/09/23 1949 10/10/23 0037 10/10/23 0427 10/11/23 0530  INR 1.0  --  1.0 1.0  PTT  --  25.1  --  24.4*   Recent Labs    10/09/23 1949 10/10/23 0426 10/11/23 0530  ALB 4.3 3.6 3.4  BILITOT 0.5 0.3 0.3  ALT 96* 73* 83*  AST 31 24 33  ALKPHOS 83 73 77     Radiology:   10/10/23 MRI C/T/L Spine:  1. No evidence of abnormal enhancement involving the spinal cord or cauda equina nerve roots.  2. Diffuse heterogeneous marrow signal predominantly T1 hypointense, likely to represent red marrow.  3. Multilevel spondylotic changes as described above without high-grade spinal canal stenosis. Prominent epidural fat in the thoracolumbar spine causing thecal sac effacement. Moderate neural foraminal stenosis at L5-S1 on the right side.   10/10/23 MRI Brain:  Mildly progressed right posterior superior frontal subcortical contrast enhancing mass lesion associated with leptomeningeal enhancement and mildly  progressed right anterior frontal lesion with similar signal changes and enhancement. More conspicuous leptomeningeal enhancement along the inferior surface of both cerebellar hemispheres.  Additional left parietal lesion with postsurgical changes following recent stereotactic biopsy at an outside hospital including small volume blood products and associated leptomeningeal enhancement. 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 Correlate with biopsy results. No midline shift.   Recommendation:  Consider whole spine MRI imaging with contrast given the evidence of leptomeningeal disease.   Micro: none  ASSESSMENT & PLAN  Elijah [name] is a 68 y.o. 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 Federico ([facility]) and [facility]. Diagnosed 04/2023 iso abdominal pain 2/2 abdominal mass. Pathology consistent with MEITL. Received CHOP -> CHOEP for total 6C with PET/CT 09/23/23 demonstrating CR. Developed new L sided weakness and admitted at OSH 7/2-10/03/23, with MRI brain notable for new brain mets. Biopsy with pathology consistent with MEITL. Managed initially with steroids, though with worsening neurologic deficits, prompting expedited admission for urgent chemotherapy initiation. Admitted 7/11 via ED. MRI brain with previous identified R frontal lesions x2, cerebellar leptomeningeal disease, and L parietal lesion (site of biopsy). MRI spine without mets. Initial plan for LP prior to chemo initiation deferred given rapidly progressing neurologic deficits, and treatment plan for MATRIX initiated yesterday evening. Temporizied with Dex and then methylpred doses while awaiting  treatment parameters for MTX. Received IVF w/ sodium bicarb in preparation for high dose MTX - met treatment parameters 7/12 PM and initiated on MATRix.   - initiate MATRix (D1 = 7/12)   - continue sodium bicarb gtt (250 cc/hr) - this should not be decreased below 200 cc/hr without fellow approval   - strict I/Os + UOP parameters/output per protocol   - flexeril PRN oxycodone  PRN w/ IV dilaudid  breakthrough for leg spams  - PT/OT eval    - c/s procedures: appreciate expertise and excellent care for patient  - seizure tx: IV ativan 2 mg x1 -> keppra  load   - monitor for toxicities  GERD: Famotidine  (no PPI on MTX)  BPH: PTA finasteride  and tamulosin.  PMR: Hold PTA prednisone  given dexamethasone  w/ chemotherapy Asthma/Seasonal Allergies: PTA loratadine , albuterol  PRN   Supportive care measure as clinically indicated Medication regimen reviewed and discussed with pharmacist  DAILY CHECKLIST  Nutrition plan reviewed? diet: regular VTE prophylaxis ordered? Heparin  (any form) Delirium: high risk Central line/PICC access needed? No  Foley catheter needed? No  Telemetry indicated? No GOC meeting needed? This is not a General Medicine admission    Arleene Larsson, MD  [facility] [place]  [facility]  PGY-2, Internal Medicine  Attending Physician Attestation  I have personally interviewed and examined the patient on rounds 10/11/2023. I have reviewed the provider's history, review of systems, physical exam, assessment and treatment plans.  I concur with or have edited all elements of the provider's note.   Delphine Henle, MD, PhD Assistant Professor  Department of Medicine, Hematology/Oncology  [facility]: [phone]

## 2023-10-12 ENCOUNTER — Encounter: Payer: Self-pay | Admitting: Hematology and Oncology

## 2023-10-13 ENCOUNTER — Inpatient Hospital Stay

## 2023-10-14 ENCOUNTER — Other Ambulatory Visit: Payer: Self-pay

## 2023-10-16 ENCOUNTER — Other Ambulatory Visit: Payer: Self-pay

## 2023-10-16 NOTE — Progress Notes (Signed)
 DEPARTMENT OF MEDICINE Hematology Oncology Service - Progress Note  Date: 10/16/2023 Team:  HO b  Name: Elijah [name]   MRN: [id number] Admit date: 10/09/2023   Principal reason for admission: T Cell Lymphoma w/ Brain Metastases   SUBJECTIVE  Patient complaints/ events Past 24 Hours: - stable overnight, received 2 x hydralazine  5 for SBP 170s - this AM, feeling well. Overall small improvements in strength and mobility in LLE - family at bedside. Discussed plan for follow up imaging later today and anticipatory guidance going forward -No dyspnea, CP, HA. no abdominal pain or cramping, no nausea/emesis  - ROS otherwise negative  OBJECTIVE  Vitals Most Recent Temp: 37 C (98.6 F) Heart Rate: 79  BP: (!) 152/74 Resp: 16 SpO2: 95 % 24 Hour Min/Max Temp  Min: 36.5 C (97.7 F)  Max: 37.1 C (98.8 F) Pulse  Min: 72  Max: 85 BP  Min: 136/74  Max: 190/80 Resp  Min: 16  Max: 18 SpO2  Min: 95 %  Max: 98 %   I have reviewed the patient's vital signs and nursing notes. Progressively HTN.  CONSTITUTIONAL: alert, no acute discomfort.  HEENT: clear OP, anicteric CARDIO: Regular rate and rhythm, no murmurs, rubs or gallops.  PULM: Converses in full sentences without strain on RA. Normal labor of breathing, lungs clear to auscultation b/l.  GI: soft, nontender, nondistended.  MSK: Warm and well perfused. No LE edema.   Neurologic Examination: Mental status: Alert & oriented to person, place, and time.   Language: Fluent language without paraphasic errors.   Cranial nerves: PERRL bilaterally. EOM grossly intact; no nystagmus or saccadic intrusions present. Facial expression is symmetric at rest and with activation. Hearing is intact to conversation. No dysarthria.  0/5 L Shoulder shrug.     Motor: Normal bulk and tone present in all four extremities. No abnormal movements, tremors, or fasciculations.   Strength exam to confrontation as below.      Shoulder abduction Elbow  flexion  Elbow extension     Grip   RIGHT  5 5 5 5    LEFT  0 0 0 0            Hip flexion  Knee flexion Knee extension  Dorsiflexion Plantar flexion  RIGHT  5 5 5 5  5    LEFT  0 0 0 2 2     Sensation: Intact in all four extremities to light touch. No extinction or neglect observed.  I have reviewed the patient's current/relevant laboratory, radiographic, and microbiological studies today.  Significant Findings, Abnormalities or Trends:   ANC Trend Recent Labs    10/14/23 0426 10/15/23 0523 10/16/23 0522  NEUTAC 11.49* 10.29* 8.87*     Recent Labs    10/14/23 0426 10/15/23 0523 10/16/23 0522  WBC 12.19* 10.38 8.95  HGB 10.0* 9.6* 9.8*  HCT 31.2* 30.0* 31.3*  PLT 303 253 244   Recent Labs    10/15/23 1404 10/15/23 2155 10/16/23 0522  NA 139 140 139  K 3.8 3.8 3.8  CL 104 103 103  CO2 29 27 30   BUN 25 24 25   CREATININE 0.9 0.8 0.8  GLU 163* 258* 209*  CALCIUM 8.3* 8.0* 8.0*  MG 2.1 2.1 2.1  PHOS 3.4 2.9 3.2    Recent Labs    10/14/23 0427 10/15/23 0523 10/16/23 0522  INR 0.9 0.9 0.9  PTT 25.1 23.5* 23.1*   Recent Labs    10/14/23 0427 10/15/23 0523 10/16/23 0522  ALB 3.1*  3.0* 3.1*  BILITOT 0.5 0.5 0.4  ALT 55* 73* 83*  AST 23 35* 32  ALKPHOS 63 59 64     Radiology:  10/13/23 CTH no contrast: no notable changes from MRI brain 7/11; demonstrates b/l cerebral mass lesions  10/13/22 CXR:  No acute process  10/12/23 CXR:  No acute cardiopulmonary process. No evidence of pulmonary edema as queried.   10/10/23 MRI C/T/L Spine:  1. No evidence of abnormal enhancement involving the spinal cord or cauda equina nerve roots.  2. Diffuse heterogeneous marrow signal predominantly T1 hypointense, likely to represent red marrow.  3. Multilevel spondylotic changes as described above without high-grade spinal canal stenosis. Prominent epidural fat in the thoracolumbar spine causing thecal sac effacement. Moderate neural foraminal stenosis at L5-S1 on the right  side.   10/10/23 MRI Brain:  Mildly progressed right posterior superior frontal subcortical contrast enhancing mass lesion associated with leptomeningeal enhancement and mildly progressed right anterior frontal lesion with similar signal changes and enhancement. More conspicuous leptomeningeal enhancement along the inferior surface of both cerebellar hemispheres.  Additional left parietal lesion with postsurgical changes following recent stereotactic biopsy at an outside hospital including small volume blood products and associated leptomeningeal enhancement. 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 Correlate with biopsy results. No midline shift.  Recommendation:  Consider whole spine MRI imaging with contrast given the evidence of leptomeningeal disease.   Micro: none  ASSESSMENT & PLAN  Elijah [name] is a 68 y.o. 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 Federico ([facility]) and [facility]. Diagnosed 04/2023 iso abdominal pain 2/2 abdominal mass. Pathology consistent with MEITL. S/p CHOP -> CHOEP x 6C with PET/CT 09/23/23 demonstrating CR. Developed new L sided weakness and admitted at OSH 7/2-10/03/23, with new brain mets on MRI- biopsy/pathology confirmed MEITL. Managed initially with steroids, though with worsening neurologic deficits, prompted admission for urgent chemotherapy. Admitted 7/11, where MRI brain showed previous identified R frontal lesions x2, cerebellar leptomeningeal disease, and L parietal lesion (site of biopsy). MRI spine without mets. Initial plan for LP prior to  chemo initiation deferred given rapidly progressing neurologic deficits, and treatment plan for MATRIX initiated 7/12. S/p Dex and methylpred doses while awaiting treatment parameters for MTX. Received IVF w/ sodium bicarb in preparation for high dose MTX - initiated on MATRix. On 7/14, new right-sided extremity weakness, other exam stable.Stroke alert called, CTH without contrast unchanged from 7/11 MRI. He would not be a candidate for thrombectomy or thrombolytic therapy. Strength marginally improved 7/16 with some left-sided function. Repeat MRI following treatment.   - MTX level 0.07 this AM; repeat MRI brain with contrast today after 1400  - continue dex 6mg  q6h  - MATRix (D1 = 7/12)   - s/p MTX 7/12: methotrexate 35 g/m2 on day 1 , s/p cytarabine D2-3 thru 7/15 AM, s/p thiotepa 7/15 (D4) - monitor MTX levels; leucovorin and other Rx per protocol - 100 mEq sodium bicarb gtt (150 cc/hr) - this should not be decreased without fellow approval  - c/s neuro oncology: appreciate ongoing recs   - continue seizure prophylaxis: keppra  250 BID - strict I/Os + UOP parameters/output   - gabapentin  three  times daily, flexeril PRN, oxycodone  PRN, w/ IV dilaudid  breakthrough for leg spams  - PT/OT following  - monitor for toxicities  - neuro checks q4hours  HTN: prn hydralazine  5 for SBP goal <160. Suspect return to normotension with taper off steroids  Dyspnea, resolved GERD: Famotidine  (no PPI on MTX)  BPH: PTA finasteride  and tamulosin.  PMR: Hold PTA prednisone  given dexamethasone  w/ chemotherapy Asthma/Seasonal Allergies: PTA loratadine , albuterol  PRN   Dispo: PT/OT for deconditioning management and strength training  Supportive care measure as clinically indicated Medication regimen reviewed and discussed with pharmacist Management reviewed and discussed with patient's primary hematologist (Dr. Cristie)  DAILY CHECKLIST  Nutrition plan reviewed? diet: regular VTE prophylaxis ordered?  Heparin  (any form) Delirium: high risk Central line/PICC access needed? No  Foley catheter needed? No  Telemetry indicated? No GOC meeting needed? This is not a General Medicine admission    Chiquita Gallery, MD Internal Medicine  PGY1 PICC [phone]  October 16, 2023    I have personally interviewed and examined the patient on 10/16/2023. I have reviewed the provider's history, physical exam, assessment and treatment plans. I concur with or have edited all elements of the provider's note. Clinically improved and able to move left foot toes. Pending brain MRI after 2pm. MTX 0.06 - almost at target. C/w dex 6 mg q6hr pending MRI. Rest as per above.  Denny Cristie, MD PhD

## 2023-10-17 ENCOUNTER — Telehealth: Payer: Self-pay | Admitting: *Deleted

## 2023-10-17 ENCOUNTER — Other Ambulatory Visit: Payer: Self-pay | Admitting: *Deleted

## 2023-10-17 DIAGNOSIS — C8449 Peripheral T-cell lymphoma, not classified, extranodal and solid organ sites: Secondary | ICD-10-CM

## 2023-10-17 NOTE — Progress Notes (Signed)
 Elijah [name]'s care has been transferred to [facility] to receive care for CNS involvement. We continue to monitor for needs.

## 2023-10-17 NOTE — Telephone Encounter (Signed)
 Received call from pt's wife, Darice. She states Elijah [name] is still in the hospital @ [facility]. But they have been told by the neuro-oncologists that his high dose methotrexate treatment is not working to reduce his CNS mets/lymphoma. She and Elijah [name] now just want to go home with Hospice services. The plan is for Elijah [name] to be discharged Monday or Tuesday and they need a hospital bed delivered this weekend so it will be ready for Elijah [name] when he gets home.And then she will need the Hospice referral sent in. Advised that I will take care of these issues.  Hospital bed ordered through [facility], for delivery on 10/19/23 Hospice referral has been called in to [facility] to start on 10/21/23. Darice and Dr. Federico are aware of the above.

## 2023-10-21 ENCOUNTER — Encounter: Payer: Self-pay | Admitting: Hematology and Oncology

## 2023-10-21 ENCOUNTER — Other Ambulatory Visit (HOSPITAL_COMMUNITY): Payer: Self-pay

## 2023-10-21 MED ORDER — HYDROMORPHONE HCL 2 MG PO TABS
2.0000 mg | ORAL_TABLET | ORAL | 0 refills | Status: DC
  Filled 2023-10-21: qty 112, 14d supply, fill #0

## 2023-10-21 NOTE — Progress Notes (Signed)
 Received fax from [facility]. AS of 7/21, Mr. Niu is in their care. Andrea CHRISTELLA Plunk, RN

## 2023-10-22 ENCOUNTER — Encounter: Payer: Self-pay | Admitting: Hematology and Oncology

## 2023-10-22 ENCOUNTER — Other Ambulatory Visit: Payer: Self-pay

## 2023-10-22 ENCOUNTER — Other Ambulatory Visit (HOSPITAL_COMMUNITY): Payer: Self-pay

## 2023-10-22 ENCOUNTER — Encounter (HOSPITAL_COMMUNITY): Payer: Self-pay

## 2023-10-22 MED ORDER — METHADONE HCL 5 MG PO TABS
5.0000 mg | ORAL_TABLET | Freq: Four times a day (QID) | ORAL | 0 refills | Status: DC
  Filled 2023-10-22 (×2): qty 60, 15d supply, fill #0

## 2023-10-26 ENCOUNTER — Encounter: Payer: Self-pay | Admitting: Hematology and Oncology

## 2023-10-31 DEATH — deceased

## 2023-12-09 ENCOUNTER — Other Ambulatory Visit (HOSPITAL_COMMUNITY): Payer: Self-pay
